# Patient Record
Sex: Female | Born: 1950
Health system: Southern US, Community
[De-identification: ages and names within clinical notes are randomized; demographics above are authoritative.]

## PROBLEM LIST (undated history)

## (undated) DIAGNOSIS — F329 Major depressive disorder, single episode, unspecified: Secondary | ICD-10-CM

## (undated) DIAGNOSIS — E785 Hyperlipidemia, unspecified: Secondary | ICD-10-CM

## (undated) DIAGNOSIS — M1990 Unspecified osteoarthritis, unspecified site: Secondary | ICD-10-CM

## (undated) DIAGNOSIS — F419 Anxiety disorder, unspecified: Secondary | ICD-10-CM

## (undated) DIAGNOSIS — T8149XA Infection following a procedure, other surgical site, initial encounter: Secondary | ICD-10-CM

## (undated) DIAGNOSIS — T8859XA Other complications of anesthesia, initial encounter: Secondary | ICD-10-CM

## (undated) DIAGNOSIS — Z9889 Other specified postprocedural states: Secondary | ICD-10-CM

## (undated) DIAGNOSIS — F32A Depression, unspecified: Secondary | ICD-10-CM

## (undated) DIAGNOSIS — T4145XA Adverse effect of unspecified anesthetic, initial encounter: Secondary | ICD-10-CM

## (undated) DIAGNOSIS — K219 Gastro-esophageal reflux disease without esophagitis: Secondary | ICD-10-CM

## (undated) DIAGNOSIS — Z9289 Personal history of other medical treatment: Secondary | ICD-10-CM

## (undated) DIAGNOSIS — I10 Essential (primary) hypertension: Secondary | ICD-10-CM

## (undated) HISTORY — PX: REDUCTION MAMMAPLASTY: SUR839

## (undated) HISTORY — DX: Personal history of other medical treatment: Z92.89

## (undated) HISTORY — PX: KNEE ARTHROSCOPY: SHX127

## (undated) HISTORY — PX: TONSILLECTOMY AND ADENOIDECTOMY: SUR1326

## (undated) HISTORY — PX: JOINT REPLACEMENT: SHX530

## (undated) HISTORY — DX: Anxiety disorder, unspecified: F41.9

## (undated) HISTORY — PX: OTHER SURGICAL HISTORY: SHX169

---

## 1998-09-23 HISTORY — PX: VAGINAL HYSTERECTOMY: SUR661

## 1998-09-23 HISTORY — PX: OTHER SURGICAL HISTORY: SHX169

## 2001-04-30 ENCOUNTER — Other Ambulatory Visit: Admission: RE | Admit: 2001-04-30 | Discharge: 2001-04-30 | Payer: Self-pay | Admitting: *Deleted

## 2001-05-26 ENCOUNTER — Encounter: Admission: RE | Admit: 2001-05-26 | Discharge: 2001-05-26 | Payer: Self-pay | Admitting: Family Medicine

## 2001-05-26 ENCOUNTER — Encounter: Payer: Self-pay | Admitting: Family Medicine

## 2002-07-06 ENCOUNTER — Ambulatory Visit: Admission: RE | Admit: 2002-07-06 | Discharge: 2002-07-06 | Payer: Self-pay | Admitting: Family Medicine

## 2002-07-06 ENCOUNTER — Encounter: Payer: Self-pay | Admitting: Family Medicine

## 2002-11-02 ENCOUNTER — Other Ambulatory Visit: Admission: RE | Admit: 2002-11-02 | Discharge: 2002-11-02 | Payer: Self-pay | Admitting: Obstetrics and Gynecology

## 2003-01-21 ENCOUNTER — Emergency Department (HOSPITAL_COMMUNITY): Admission: EM | Admit: 2003-01-21 | Discharge: 2003-01-22 | Payer: Self-pay | Admitting: Emergency Medicine

## 2003-01-21 ENCOUNTER — Encounter: Payer: Self-pay | Admitting: Emergency Medicine

## 2003-02-25 ENCOUNTER — Encounter: Payer: Self-pay | Admitting: Obstetrics and Gynecology

## 2003-02-25 ENCOUNTER — Encounter: Admission: RE | Admit: 2003-02-25 | Discharge: 2003-02-25 | Payer: Self-pay | Admitting: Obstetrics and Gynecology

## 2003-03-20 ENCOUNTER — Ambulatory Visit (HOSPITAL_COMMUNITY): Admission: RE | Admit: 2003-03-20 | Discharge: 2003-03-20 | Payer: Self-pay | Admitting: Specialist

## 2003-03-20 ENCOUNTER — Encounter: Payer: Self-pay | Admitting: Specialist

## 2003-09-19 ENCOUNTER — Ambulatory Visit (HOSPITAL_COMMUNITY): Admission: RE | Admit: 2003-09-19 | Discharge: 2003-09-19 | Payer: Self-pay | Admitting: Urology

## 2004-03-16 ENCOUNTER — Encounter: Admission: RE | Admit: 2004-03-16 | Discharge: 2004-03-16 | Payer: Self-pay | Admitting: Internal Medicine

## 2004-08-16 ENCOUNTER — Emergency Department (HOSPITAL_COMMUNITY): Admission: EM | Admit: 2004-08-16 | Discharge: 2004-08-16 | Payer: Self-pay | Admitting: Family Medicine

## 2005-06-13 ENCOUNTER — Encounter: Admission: RE | Admit: 2005-06-13 | Discharge: 2005-06-13 | Payer: Self-pay | Admitting: Internal Medicine

## 2006-01-13 ENCOUNTER — Encounter (HOSPITAL_COMMUNITY): Admission: RE | Admit: 2006-01-13 | Discharge: 2006-04-13 | Payer: Self-pay | Admitting: Cardiology

## 2006-09-09 ENCOUNTER — Encounter: Admission: RE | Admit: 2006-09-09 | Discharge: 2006-09-09 | Payer: Self-pay | Admitting: Internal Medicine

## 2006-09-09 LAB — HM MAMMOGRAPHY

## 2006-11-03 ENCOUNTER — Ambulatory Visit (HOSPITAL_COMMUNITY): Admission: RE | Admit: 2006-11-03 | Discharge: 2006-11-03 | Payer: Self-pay | Admitting: Physician Assistant

## 2007-12-17 ENCOUNTER — Encounter: Admission: RE | Admit: 2007-12-17 | Discharge: 2007-12-17 | Payer: Self-pay | Admitting: Internal Medicine

## 2008-04-12 ENCOUNTER — Ambulatory Visit (HOSPITAL_BASED_OUTPATIENT_CLINIC_OR_DEPARTMENT_OTHER): Admission: RE | Admit: 2008-04-12 | Discharge: 2008-04-13 | Payer: Self-pay | Admitting: Plastic Surgery

## 2008-04-12 ENCOUNTER — Encounter (INDEPENDENT_AMBULATORY_CARE_PROVIDER_SITE_OTHER): Payer: Self-pay | Admitting: Plastic Surgery

## 2008-04-12 HISTORY — PX: BREAST REDUCTION SURGERY: SHX8

## 2008-05-17 ENCOUNTER — Encounter: Admission: RE | Admit: 2008-05-17 | Discharge: 2008-05-17 | Payer: Self-pay | Admitting: Internal Medicine

## 2008-11-02 ENCOUNTER — Encounter: Admission: RE | Admit: 2008-11-02 | Discharge: 2008-12-05 | Payer: Self-pay | Admitting: Internal Medicine

## 2009-02-07 ENCOUNTER — Ambulatory Visit (HOSPITAL_BASED_OUTPATIENT_CLINIC_OR_DEPARTMENT_OTHER): Admission: RE | Admit: 2009-02-07 | Discharge: 2009-02-07 | Payer: Self-pay | Admitting: Urology

## 2009-03-14 ENCOUNTER — Ambulatory Visit (HOSPITAL_BASED_OUTPATIENT_CLINIC_OR_DEPARTMENT_OTHER): Admission: RE | Admit: 2009-03-14 | Discharge: 2009-03-14 | Payer: Self-pay | Admitting: Urology

## 2009-03-16 ENCOUNTER — Encounter: Admission: RE | Admit: 2009-03-16 | Discharge: 2009-03-16 | Payer: Self-pay | Admitting: Internal Medicine

## 2009-08-28 ENCOUNTER — Ambulatory Visit: Payer: Self-pay | Admitting: Sports Medicine

## 2009-08-28 DIAGNOSIS — M659 Synovitis and tenosynovitis, unspecified: Secondary | ICD-10-CM | POA: Insufficient documentation

## 2009-08-28 DIAGNOSIS — R269 Unspecified abnormalities of gait and mobility: Secondary | ICD-10-CM | POA: Insufficient documentation

## 2009-08-28 DIAGNOSIS — M2140 Flat foot [pes planus] (acquired), unspecified foot: Secondary | ICD-10-CM | POA: Insufficient documentation

## 2009-08-28 DIAGNOSIS — M79609 Pain in unspecified limb: Secondary | ICD-10-CM | POA: Insufficient documentation

## 2009-09-28 ENCOUNTER — Ambulatory Visit (HOSPITAL_COMMUNITY): Admission: RE | Admit: 2009-09-28 | Discharge: 2009-09-28 | Payer: Self-pay | Admitting: Obstetrics and Gynecology

## 2009-10-24 ENCOUNTER — Encounter: Admission: RE | Admit: 2009-10-24 | Discharge: 2009-10-24 | Payer: Self-pay | Admitting: Gastroenterology

## 2009-11-01 ENCOUNTER — Emergency Department (HOSPITAL_COMMUNITY): Admission: EM | Admit: 2009-11-01 | Discharge: 2009-11-01 | Payer: Self-pay | Admitting: Emergency Medicine

## 2009-12-26 ENCOUNTER — Encounter (HOSPITAL_COMMUNITY): Admission: RE | Admit: 2009-12-26 | Discharge: 2010-02-21 | Payer: Self-pay | Admitting: Interventional Cardiology

## 2009-12-26 HISTORY — PX: CARDIOVASCULAR STRESS TEST: SHX262

## 2010-04-20 ENCOUNTER — Encounter: Admission: RE | Admit: 2010-04-20 | Discharge: 2010-04-20 | Payer: Self-pay | Admitting: Internal Medicine

## 2010-05-25 ENCOUNTER — Ambulatory Visit (HOSPITAL_BASED_OUTPATIENT_CLINIC_OR_DEPARTMENT_OTHER): Admission: RE | Admit: 2010-05-25 | Discharge: 2010-05-25 | Payer: Self-pay | Admitting: Urology

## 2010-06-25 ENCOUNTER — Ambulatory Visit (HOSPITAL_COMMUNITY): Admission: RE | Admit: 2010-06-25 | Discharge: 2010-06-25 | Payer: Self-pay | Admitting: Urology

## 2010-10-13 ENCOUNTER — Encounter: Payer: Self-pay | Admitting: Internal Medicine

## 2010-10-14 ENCOUNTER — Encounter: Payer: Self-pay | Admitting: Gastroenterology

## 2010-10-14 ENCOUNTER — Encounter: Payer: Self-pay | Admitting: Occupational Medicine

## 2010-10-23 NOTE — Assessment & Plan Note (Signed)
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   Allergies (verified): No Known Drug Allergies  Physical Exam  General:  Well-developed,well-nourished,in no acute distress; alert,appropriate and cooperative throughout examination Msk:  swelling on outside of RT ankle this starts at level of lat malleolus and extends down with peroneal tendons sweeliong at insertion of PB into 5th MT base Thisis TTP MT shaft not as tender  standing has genu valgus has midfoot and long arch collapse worse on RT than left  MSK [place] there is increase in swelling of peroneal tendon sheaths on RT there is not a sign of avulsion or stress fx at base of 5th   Impression & Recommendations:  Problem # 1:  FOOT PAIN, RIGHT (ICD-729.5)  Orders: Airsport brace (U0454)  reassured that this is not stress fx see isntructions  Problem # 2:  ABNORMALITY OF GAIT (ICD-781.2) will use ankle support to lessen the sublux of ankle  note her gait improved and she felt better with changes in office  Problem # 3:  TENOSYNOVITIS OF FOOT AND ANKLE (ICD-727.06) use suport as noted icing meds as noted  Problem # 4:  PES PLANUS (ICD-734) needs scaphoid pads in most shoes consider orthotic if not getting enough relief  Patient Instructions: 1)  Use ankle brace while standing or doing a lot of walking over next 6 weeks 2)  heels are OK 3)  use arch pads in flatter shoes 4)  ice outside of foot and ankle at end  of day - use cup and massage - 5 to 10 mins 5)  try your biofreeze 6)  if too painful take some naprosyn 7)  exercises - just range of motion - writing alphabet 8)  recheck post 6 weeks

## 2010-12-06 LAB — POCT I-STAT 4, (NA,K, GLUC, HGB,HCT)
Glucose, Bld: 94 mg/dL (ref 70–99)
HCT: 41 % (ref 36.0–46.0)
Hemoglobin: 13.9 g/dL (ref 12.0–15.0)
Potassium: 3.8 mEq/L (ref 3.5–5.1)
Sodium: 144 mEq/L (ref 135–145)

## 2010-12-08 LAB — CREATININE, SERUM
Creatinine, Ser: 0.98 mg/dL (ref 0.4–1.2)
GFR calc Af Amer: >60 mL/min (ref >60)
GFR calc non Af Amer: 59 mL/min — ABNORMAL LOW (ref >60)

## 2010-12-08 LAB — BUN: BUN: 21 mg/dL (ref 6–23)

## 2010-12-31 LAB — POCT I-STAT 4, (NA,K, GLUC, HGB,HCT)
Glucose, Bld: 85 mg/dL (ref 70–99)
HCT: 41 % (ref 36.0–46.0)
Hemoglobin: 13.9 g/dL (ref 12.0–15.0)
Potassium: 3.8 mEq/L (ref 3.5–5.1)
Sodium: 142 mEq/L (ref 135–145)

## 2011-01-01 LAB — URINALYSIS, MICROSCOPIC ONLY
Bilirubin Urine: NEGATIVE
Glucose, UA: NEGATIVE mg/dL
Hgb urine dipstick: NEGATIVE
Ketones, ur: NEGATIVE mg/dL
Nitrite: NEGATIVE
Protein, ur: NEGATIVE mg/dL
Specific Gravity, Urine: 1.017 (ref 1.005–1.030)
Urobilinogen, UA: 0.2 mg/dL (ref 0.0–1.0)
pH: 6.5 (ref 5.0–8.0)

## 2011-01-01 LAB — URINE CULTURE
Colony Count: 15000
Special Requests: NEGATIVE

## 2011-01-01 LAB — COMPREHENSIVE METABOLIC PANEL
ALT: 21 U/L (ref 0–35)
AST: 27 U/L (ref 0–37)
Albumin: 3.5 g/dL (ref 3.5–5.2)
Alkaline Phosphatase: 86 U/L (ref 39–117)
BUN: 17 mg/dL (ref 6–23)
CO2: 29 mEq/L (ref 19–32)
Calcium: 9.3 mg/dL (ref 8.4–10.5)
Chloride: 104 mEq/L (ref 96–112)
Creatinine, Ser: 0.9 mg/dL (ref 0.4–1.2)
GFR calc Af Amer: >60 mL/min (ref >60)
GFR calc non Af Amer: >60 mL/min (ref >60)
Glucose, Bld: 85 mg/dL (ref 70–99)
Potassium: 3.6 mEq/L (ref 3.5–5.1)
Sodium: 141 mEq/L (ref 135–145)
Total Bilirubin: 0.8 mg/dL (ref 0.3–1.2)
Total Protein: 6.5 g/dL (ref 6.0–8.3)

## 2011-01-01 LAB — CBC
HCT: 38.6 % (ref 36.0–46.0)
Hemoglobin: 13 g/dL (ref 12.0–15.0)
MCHC: 33.8 g/dL (ref 30.0–36.0)
MCV: 88.8 fL (ref 78.0–100.0)
Platelets: 224 10*3/uL (ref 150–400)
RBC: 4.35 MIL/uL (ref 3.87–5.11)
RDW: 14.3 % (ref 11.5–15.5)
WBC: 5.5 10*3/uL (ref 4.0–10.5)

## 2011-01-01 LAB — APTT: aPTT: 26 seconds (ref 24–37)

## 2011-01-01 LAB — PROTIME-INR
INR: 1 (ref 0.00–1.49)
Prothrombin Time: 13.3 seconds (ref 11.6–15.2)

## 2011-02-05 NOTE — Op Note (Signed)
Jennifer Sanford, Jennifer Sanford                 ACCOUNT NO.:  [id number]   MEDICAL RECORD NO.:  [id number]          PATIENT TYPE:  AMB   LOCATION:  [facility]                         FACILITY:  [facility]   PHYSICIAN:  Jennifer Sanford, M.D.  DATE OF BIRTH:  22-Mar-1951   DATE OF PROCEDURE:  02/07/2009  DATE OF DISCHARGE:                               OPERATIVE REPORT   PREOPERATIVE DIAGNOSIS:  Right hydronephrosis.   POSTOPERATIVE DIAGNOSIS:  Right hydronephrosis plus ureteral stricture  (distal).   PROCEDURE DONE:  Cystoscopy, right retrograde pyelogram, balloon  dilation of the ureteral stricture, ureteroscopy, and insertion of  double-J stent.   SURGEONS:  Danae Chen, MD, and Jennifer Sanford. Jennifer Ammons, MD.   ANESTHESIA:  General.   INDICATION:  The patient is a 60 year old female, who had a ureteral  reimplantation done for ureteral injury in 2001 in [place], [place].  About a month ago while she was in [place], she had severe right flank  pain, went to the emergency room, was found to have right  hydronephrosis, and she was told that she had kidney stones.  She  returned to [facility] and was still having pain on and off.  I reviewed  the CT scan with Dr. Fredia Sanford, and we do not think that there is any  ureteral stone, but she has marked right hydronephrosis.  She is  scheduled today for cystoscopy, retrograde pyelogram, and insertion of  double-J stent.   Patient was identified by her wrist band and proper timeout was taken.   She was then placed in the dorsal lithotomy position, a panendoscope was  inserted in the bladder.  The bladder mucosa is normal, there is no  stone or tumor in the bladder.  The native right ureteral orifice is in  place, the left ureteral orifice is in normal position with clear  efflux.  There is a dimple at the dome of the bladder on the right side  that gives you the impression of a neoureteral orifice.   RETROGRADE PYELOGRAM:  I catheterized the native right ureteral  orifice  with a #5-French open-ended ureteral catheter.  I injected contrast  through the open-ended catheter.  The contrast stopped in the distal  ureter without progression into the midureter.   I then attempted to catheterize what appears to be the neoureteral  orifice at the dome of the bladder on the right side and it was  difficult to pass a Glidewire through that stenotic opening.  After  several maneuvers, I was able to pass the Glidewire through that orifice  and advanced a #5-French open-ended catheter over the Glidewire.  The  Glidewire was then removed.  Contrast was then injected through the open-  ended catheter.  The intramural ureter is very stenotic and there is a U-  deformity of the distal ureter and then contrast went up the ureter,  which appears dilated.  I then gently passed a Glidewire through the  open-ended catheter and was able to advance the Glidewire up into the  renal pelvis and I was able to advance the #5-French open-ended ended  catheter  over the Glidewire into the renal pelvis.  I then removed the  Glidewire and injected contrast through the open-ended catheter.  The  renal pelvis and calices are markedly dilated.  There is no evidence of  filling defect in the renal pelvis nor in the calices.  I then passed a  sensor guidewire through the open-ended catheter and removed the open-  ended catheter.  I then attempted to pass a #8-French double-J catheter  over the guidewire and the #8-French catheter would not go through the  intramural ureter.  I then removed the #8-French catheter and passed a  number #4.8-French double-J catheter over the guidewire without  difficulty into the renal pelvis.  At that point, I thought I would  remove the #4-French double-J stent and replace it with a #6-French and  in the process of removing the double-J stent, the distal end of the  stent migrated up into the ureter.  I attempted to pass a  ureteroscope  through the  distal ureter but was unable to do so.  At that point, I  called Dr. Vernie Sanford and asked him for consultation.  Since the guidewire  was already in place, he suggested to dilate the intramural ureter and  then use the ureteroscope and pull the double-J stent down into the  bladder.   I passed a ureteral balloon dilator over the guidewire into the distal  ureter and dilated the intramural ureter up to number 14 mmHg.  I then  removed the ureteral balloon dilator and I was able to pass the  ureteroscope without difficulty through the intramural ureter.  Then,  the distal end of the double-J stent was seen in the ureter and was  grasped with a Nitinol basket and pulled down in the bladder.  The  proximal curl of the double-J stent is in the renal pelvis, the distal  curl is in the bladder.  The ureteroscope was then removed.  The  cystoscope was then reinserted in the bladder.  The distal curl of the  double-J stent is well-seen in the bladder.  The bladder was then  emptied and the cystoscope and guidewire were removed.   The patient tolerated the procedure well and left the OR in satisfactory  condition to postanesthesia care unit.      Jennifer Sanford, M.D.  Electronically Signed     MN/MEDQ  D:  02/07/2009  T:  02/07/2009  Job:  [id number]

## 2011-02-05 NOTE — Op Note (Signed)
NAMELINZY, Jennifer Sanford                 ACCOUNT NO.:  [id number]   MEDICAL RECORD NO.:  [id number]          PATIENT TYPE:  AMB   LOCATION:  [facility]                          FACILITY:  [facility]   PHYSICIAN:  Etter Sjogren, M.D.     DATE OF BIRTH:  09/21/1951   DATE OF PROCEDURE:  04/12/2008  DATE OF DISCHARGE:                               OPERATIVE REPORT   PREOPERATIVE DIAGNOSIS:  Macromastia.   POSTOPERATIVE DIAGNOSIS:  Macromastia.   PROCEDURE PERFORMED:  Bilateral breast reduction.   SURGEON:  Etter Sjogren, MD   ASSISTANT:  Pleas Patricia, MD   ANESTHESIA:  General.   ESTIMATED BLOOD LOSS:  200 mL.   CLINICAL NOTE:  A 60 year old woman complains of large breast with upper  back, neck, shoulder pain, bra strap shoulder grooving and desires a  breast reduction.  The procedure and the risks and post complications  were discussed with her in great detail.  She understood those risks  including but not limited to bleeding, infection, anesthesia  complications, healing problems, scarring, loss of sensation in the  nipple, loss of the nipple and the possibility of free nipple grafting  during the surgery depending upon the appearance of the nipple, fluid  collections, significant scarring issues, asymmetry and loss of skin,  loss of fat including fat necrosis.  She understood all this and wished  to proceed.   DESCRIPTION OF PROCEDURE:  The patient was placed in a full sitting  position in the holding area and marked for the breast reduction.  She  was then taken to the operating room and placed supine.  After  successful induction of general anesthesia, she was prepped with  Betadine and draped with sterile drapes.  An 8-cm wide inferior nipple-  areolar pedicle was marked and the 42-mm marker marked the new nipple-  areolar complex.  Incisions made, pedicle de-epithelialized and was then  dissected free from the surrounding tissue beveling in an hour direction  medially and laterally  in order to ensure a broader attachment at the  level of the chest wall than it was present at the level of the skin.  At the conclusion of this dissection, nipple complexes were inspected  and found to have excellent color with bright red bleeding around the  periphery consistent with viability.  Resections were performed medial,  central and lateral and the total amount resected 901 grams from the  smaller left side, and 1004 grams from the larger right side.  It seemed  to give her a very nice symmetry.  A thorough irrigation with saline.  Meticulous hemostasis with electrocautery.  Excellent hemostasis having  been achieved, the closure with 2-0 Monocryl interrupted inverted deep  suture at the inverted T position, 3-0 Monocryl interrupted inverted  deep dermal sutures, a running 3-0 Monocryl subcuticular suture along  the inframammary crease.  A measurement was then taken 5 cm up from the  inframammary crease and the 42-mm marker marked the new nipple-areolar  site.  Tissue resected and the nipple-areolar complex was brought  through this opening.  They were inspected again.  They were found to  have excellent color and again bright red bleeding around the periphery  consistent with viability.  Nipple-areolar insetting 3-0  Monocryl interrupted inverted deep dermal sutures and running 4-0  Monocryl subcuticular suture.  Steri-Strips, dry sterile dressing and a  circumferential Ace wrap were applied.  She was transferred to the  recovery room stable having tolerated the procedure well and she will be  observed in the RCC overnight.      Etter Sjogren, M.D.  Electronically Signed     DB/MEDQ  D:  04/12/2008  T:  04/13/2008  Job:  [id number]

## 2011-02-05 NOTE — Op Note (Signed)
Jennifer Sanford, Jennifer Sanford                 ACCOUNT NO.:  [id number]   MEDICAL RECORD NO.:  [id number]          PATIENT TYPE:  AMB   LOCATION:  [facility]                         FACILITY:  [facility]   PHYSICIAN:  [name], M.D.  DATE OF BIRTH:  03/21/1951   DATE OF PROCEDURE:  03/14/2009  DATE OF DISCHARGE:                               OPERATIVE REPORT   PREOPERATIVE DIAGNOSES:  1. Right hydronephrosis.  2. Ureteral stricture.   POSTOPERATIVE DIAGNOSES:  1. Right hydronephrosis.  2. Ureteral stricture.   PROCEDURE:  Cystoscopy, right retrograde pyelogram, ureteral dilation,  and insertion of double-J stent.   SURGEON:  Dr. Brunilda Payor.   ANESTHESIA:  General.   INDICATIONS:  The patient is a 60 years old female who had ureteral  reimplantation for ureteral injury several years ago in [place], [place].  She developed a ureteral stricture with right hydronephrosis.  She had ureteral dilation done on Feb 07, 2009 with insertion of a #4-  [place] double-J catheter.  The double-J stent was removed on 06/03.  Renal ultrasound post stent removal showed worsening right  hydronephrosis.  She is scheduled today for cystoscopy, retrograde  pyelogram, ureteral dilation, and insertion of a larger double-J stent.  She understands that after removing the stent if she starts having again  hydronephrosis she will need ureteral exploration with reimplantation,  and she agrees to have the procedure done, since it is less invasive  than an open procedure at this time.   The patient was identified by her wrist band and proper time-out was  taken.   DESCRIPTION OF PROCEDURE:  Under general anesthesia she was prepped and  draped and placed in the dorsal lithotomy position.  A panendoscope was  inserted in the bladder.  The bladder mucosa is normal.  There is no  stone or tumor in the bladder.  The neo ureteral orifice was visualized  at the dome of the bladder on the right side.  A #6-French double-J  catheter was passed through the cystoscope and a Glidewire was passed  through the open-ended catheter, and the Glidewire was passed through  the neo ureteral orifice, but the #6-French double-J catheter could not  be passed over the Glidewire.  The #6-French was then removed.  A 5-  [place] open-ended catheter was passed through the cystoscope and the  Glidewire was passed through the #5-French open-ended catheter, and  passed through the ureteral orifice, and the #5-French open-ended  catheter was passed over the Glidewire in the distal ureter, and the  Glidewire was passed all the way up into the renal pelvis.  The open-  ended catheter was then passed in the distal ureter, and the Glidewire  was removed.  Contrast was then injected.   Retrograde pyelogram.  Contrast was then injected through the open-ended  catheter.  The ureter is moderately dilated and the renal pelvis and  collecting system are dilated.  A sensor tip guidewire was then passed  through the open-ended catheter and the open-ended catheter was removed.  A ureteral balloon catheter was then passed over the sensor guidewire,  and  the stricture of the distal ureter was dilated for 5 minutes with 14  mmHg pressure.  Then the ureteral balloon catheter was removed.  The  ureteroscope access sheath was then passed without difficulty over the  Glidewire into the mid ureter.  The ureteroscope access sheath was used  with the outer sheath also.  The ureteroscope access sheath was then  removed.  A #8-French - 24 double-J catheter was then passed over the  guide wire.  The proximal curl of the double-J catheter is in the  collecting system.  The distal curl is in the bladder.   The bladder was then emptied and the cystoscope and guidewire were  removed.   The patient tolerated the procedure well and left the OR in satisfactory  condition to the post anesthesia care unit.      [name], M.D.  Electronically  Signed     MN/MEDQ  D:  03/14/2009  T:  03/14/2009  Job:  [id number]

## 2011-02-08 NOTE — Op Note (Signed)
NAMEKERYN, NESSLER                 ACCOUNT NO.:  [id number]   MEDICAL RECORD NO.:  [id number]          PATIENT TYPE:  AMB   LOCATION:  [facility]                         FACILITY:  [facility]   PHYSICIAN:  Anselmo Rod, M.D.  DATE OF BIRTH:  1951-02-07   DATE OF PROCEDURE:  11/04/2006  DATE OF DISCHARGE:                               OPERATIVE REPORT   PROCEDURE PERFORMED:  Screening colonoscopy.   ENDOSCOPIST:  Anselmo Rod, M.D.   INSTRUMENT USED:  Pentax video colonoscope.   INDICATIONS FOR PROCEDURE:  A 60 year old African-American female  undergoing screening colonoscopy to rule out colonic polyps.   PREPROCEDURE PREPARATION:  Informed consent was procured from the  patient.  The patient was fasted for four hours prior to the procedure  and prepped with 20 OsmoPrep pills the night of and 12 Osmoprep pills  the morning of the procedure.  The risks and benefits of the procedure  including a 10% miss rate for cancer or polyps was discussed with the  patient as well.   PREPROCEDURE PHYSICAL:  The patient had stable vital signs.  Neck  supple.  Chest clear to auscultation.  S1 and S2 regular.  Abdomen soft  with normal bowel sounds.   DESCRIPTION OF PROCEDURE:  The patient was placed in left lateral  decubitus position and sedated with 150 mcg of Fentanyl and 12.5 mg of  Versed intravenously in slow incremental doses.  Once the patient was  adequately sedated and maintained on low flow oxygen and continuous  cardiac monitoring, the Pentax video colonoscope was advanced from the  rectum to the cecum.  The appendicular orifice and ileocecal valve were  clearly visualized and photographed.  The terminal ileum appeared  healthy and without lesions.  No masses, polyps, erosions, ulcerations  or diverticula were seen.  Retroflexion in the rectum revealed no  abnormalities.   IMPRESSION:  1. Normal colonoscopy to the terminal ileum.  No masses, polyps or      diverticula seen.  2.  Normal-appearing rectum.  No abnormalities noted on retroflexion in      the rectum.   RECOMMENDATIONS:  1. Continue high fiber diet with liberal fluid intake.  2. Repeat colonoscopy is recommended in the next 10 year yeas.  If the      patient develops any abnormal symptoms in the interim she should      contact the office immediately for further recommendations.  3. Outpatient followup as need arises in the future.      Anselmo Rod, M.D.  Electronically Signed     JNM/MEDQ  D:  11/04/2006  T:  11/05/2006  Job:  [id number]   cc:   Merlene Laughter. Renae Gloss, M.D.

## 2011-05-20 ENCOUNTER — Other Ambulatory Visit: Payer: Self-pay | Admitting: Internal Medicine

## 2011-05-20 DIAGNOSIS — Z1231 Encounter for screening mammogram for malignant neoplasm of breast: Secondary | ICD-10-CM

## 2011-05-24 ENCOUNTER — Ambulatory Visit
Admission: RE | Admit: 2011-05-24 | Discharge: 2011-05-24 | Disposition: A | Discharge status: Home / Self Care | Payer: 59 | Source: Ambulatory Visit | Attending: Internal Medicine | Admitting: Internal Medicine

## 2011-05-24 DIAGNOSIS — Z1231 Encounter for screening mammogram for malignant neoplasm of breast: Secondary | ICD-10-CM

## 2011-06-21 LAB — BASIC METABOLIC PANEL
BUN: 15
CO2: 30
Calcium: 9.7
Chloride: 108
Creatinine, Ser: 0.92
GFR calc Af Amer: >60
GFR calc non Af Amer: >60
Glucose, Bld: 85
Potassium: 4.6
Sodium: 144

## 2011-06-21 LAB — POCT HEMOGLOBIN-HEMACUE: Hemoglobin: 14.9

## 2011-09-16 ENCOUNTER — Emergency Department (HOSPITAL_COMMUNITY)
Admission: EM | Admit: 2011-09-16 | Discharge: 2011-09-16 | Disposition: A | Discharge status: Home / Self Care | Payer: 59 | Source: Home / Self Care | Attending: Emergency Medicine | Admitting: Emergency Medicine

## 2011-09-16 ENCOUNTER — Encounter: Payer: Self-pay | Admitting: *Deleted

## 2011-09-16 DIAGNOSIS — L0202 Furuncle of face: Secondary | ICD-10-CM

## 2011-09-16 DIAGNOSIS — H60399 Other infective otitis externa, unspecified ear: Secondary | ICD-10-CM

## 2011-09-16 DIAGNOSIS — H6010 Cellulitis of external ear, unspecified ear: Secondary | ICD-10-CM

## 2011-09-16 DIAGNOSIS — H6000 Abscess of external ear, unspecified ear: Secondary | ICD-10-CM

## 2011-09-16 HISTORY — DX: Essential (primary) hypertension: I10

## 2011-09-16 MED ORDER — IBUPROFEN 800 MG PO TABS
ORAL_TABLET | ORAL | Status: AC
  Filled 2011-09-16: qty 1

## 2011-09-16 MED ORDER — MUPIROCIN 2 % EX OINT: TOPICAL_OINTMENT | Freq: Three times a day (TID) | CUTANEOUS | Status: AC

## 2011-09-16 MED ORDER — IBUPROFEN 800 MG PO TABS
800.0000 mg | ORAL_TABLET | Freq: Once | ORAL | Status: AC
  Administered 2011-09-16: 800 mg via ORAL

## 2011-09-16 MED ORDER — DOXYCYCLINE HYCLATE 100 MG PO CAPS: 100.0000 mg | ORAL_CAPSULE | Freq: Two times a day (BID) | ORAL | Status: AC

## 2011-09-16 NOTE — ED Provider Notes (Addendum)
History     CSN: [id number]  Arrival date & time 09/16/11  4098   First MD Initiated Contact with Patient 09/16/11 [phone]      Chief Complaint  Patient presents with  . Otalgia    (Consider location/radiation/quality/duration/timing/severity/associated sxs/prior treatment) HPI Comments: R aer has been sore and tender for the last couple of days, its hurting now" No drainage or changes in hearing, No injury that I remember" No fevers  Patient is a 60 y.o. female presenting with ear pain. The history is provided by the patient.  Otalgia This is a new problem. The current episode started more than 2 days ago. There is pain in the right ear. The problem occurs constantly. The problem has been gradually worsening. There has been no fever. The pain is at a severity of 6/10. Pertinent negatives include no ear discharge, no headaches, no hearing loss, no rhinorrhea, no neck pain and no cough.    Past Medical History  Diagnosis Date  . Hypertension     Past Surgical History  Procedure Date  . Abdominal hysterectomy     Family History  Problem Relation Age of Onset  . Diabetes Mother   . Hypertension Father   . Cancer Father   . Cancer Other     History  Substance Use Topics  . Smoking status: Not on file  . Smokeless tobacco: Not on file  . Alcohol Use:     OB History    Grav Para Term Preterm Abortions TAB SAB Ect Mult Living                  Review of Systems  HENT: Positive for ear pain. Negative for hearing loss, rhinorrhea, neck pain, tinnitus and ear discharge.   Respiratory: Negative for cough.   Neurological: Negative for headaches.    Allergies  Review of patient's allergies indicates no known allergies.  Home Medications   Current Outpatient Rx  Name Route Sig Dispense Refill  . CITALOPRAM HYDROBROMIDE 10 MG PO TABS Oral Take 10 mg by mouth daily.      Marland Kitchen ROSUVASTATIN CALCIUM 20 MG PO TABS Oral Take 20 mg by mouth daily.      . TRIAMTERENE-HCTZ  37.5-25 MG PO TABS Oral Take 1 tablet by mouth daily.      Marland Kitchen DOXYCYCLINE HYCLATE 100 MG PO CAPS Oral Take 1 capsule (100 mg total) by mouth 2 (two) times daily. 20 capsule 0  . MUPIROCIN 2 % EX OINT Topical Apply topically 3 (three) times daily. X 7 days 22 g 0    BP 115/67  Pulse 66  Temp(Src) 98.3 F (36.8 C) (Oral)  Resp 16  SpO2 100%  Physical Exam  Nursing note and vitals reviewed. Constitutional: She appears well-developed and well-nourished.  HENT:  Head: Normocephalic.  Right Ear: There is swelling and tenderness. No drainage. No foreign bodies. Tympanic membrane is not injected and not erythematous.  Ears:  Eyes: Conjunctivae are normal.  Neck: Neck supple. No JVD present.  Lymphadenopathy:    She has no cervical adenopathy.  Neurological: She is alert.  Skin: Skin is warm. No erythema.    ED Course  INCISION AND DRAINAGE Performed by: Veneta Sliter Authorized by: Deondrick Searls  INCISION AND DRAINAGE Performed by: Kentrail Shew Authorized by: Jimmie Molly Consent: Verbal consent obtained. Patient understanding: patient states understanding of the procedure being performed Patient identity confirmed: verbally with patient Type: abscess Location: external ear canal. Patient sedated: no Complexity: simple Drainage: serosanguinous   (  including critical care time)  Labs Reviewed - No data to display No results found.   1. Furuncle of ear   2. Cellulitis of ear canal       MDM  R era canal early abscess formation x 4 days- punctured with 18 g needled- topical and systemic antibiotics.        Jimmie Molly, MD 09/16/11 1418  Jimmie Molly, MD 09/16/11 [phone]

## 2011-09-16 NOTE — ED Notes (Signed)
Pt  Reports  Some  Pain /  Selling  r  Ear  Canal    X  4  Days  denys  Any other  Symptoms

## 2012-07-10 ENCOUNTER — Other Ambulatory Visit: Payer: Self-pay | Admitting: Internal Medicine

## 2012-07-10 DIAGNOSIS — Z1231 Encounter for screening mammogram for malignant neoplasm of breast: Secondary | ICD-10-CM

## 2012-07-29 ENCOUNTER — Ambulatory Visit
Admission: RE | Admit: 2012-07-29 | Discharge: 2012-07-29 | Disposition: A | Discharge status: Home / Self Care | Payer: 59 | Source: Ambulatory Visit | Attending: Internal Medicine | Admitting: Internal Medicine

## 2012-07-29 DIAGNOSIS — Z1231 Encounter for screening mammogram for malignant neoplasm of breast: Secondary | ICD-10-CM

## 2013-02-16 ENCOUNTER — Other Ambulatory Visit: Payer: Self-pay | Admitting: Orthopedic Surgery

## 2013-03-03 ENCOUNTER — Other Ambulatory Visit: Payer: Self-pay | Admitting: Orthopedic Surgery

## 2013-03-03 MED ORDER — DEXAMETHASONE SODIUM PHOSPHATE 10 MG/ML IJ SOLN: 10.0000 mg | Freq: Once | INTRAMUSCULAR | Status: DC

## 2013-03-03 NOTE — Progress Notes (Signed)
Preoperative surgical orders have been place into the Epic hospital system for Jennifer Sanford on 03/03/2013, 10:35 PM  by Patrica Duel for surgery on 03/17/2013.  Preop Bilateral Total Knee orders including Epidural per Anesthesia, PO Tylenol, and IV Decadron as long as there are no contraindications to the above medications. Avel Peace, PA-C

## 2013-03-05 ENCOUNTER — Encounter (HOSPITAL_COMMUNITY): Payer: Self-pay | Admitting: Pharmacy Technician

## 2013-03-12 ENCOUNTER — Encounter (HOSPITAL_COMMUNITY)
Admission: RE | Admit: 2013-03-12 | Discharge: 2013-03-12 | Disposition: A | Discharge status: Home / Self Care | Payer: 59 | Source: Ambulatory Visit | Attending: Orthopedic Surgery | Admitting: Orthopedic Surgery

## 2013-03-12 ENCOUNTER — Other Ambulatory Visit: Payer: Self-pay

## 2013-03-12 ENCOUNTER — Encounter (HOSPITAL_COMMUNITY): Payer: Self-pay

## 2013-03-12 ENCOUNTER — Ambulatory Visit (HOSPITAL_COMMUNITY)
Admission: RE | Admit: 2013-03-12 | Discharge: 2013-03-12 | Disposition: A | Discharge status: Home / Self Care | Payer: 59 | Source: Ambulatory Visit | Attending: Orthopedic Surgery | Admitting: Orthopedic Surgery

## 2013-03-12 DIAGNOSIS — Z01818 Encounter for other preprocedural examination: Secondary | ICD-10-CM | POA: Insufficient documentation

## 2013-03-12 DIAGNOSIS — Z01810 Encounter for preprocedural cardiovascular examination: Secondary | ICD-10-CM | POA: Insufficient documentation

## 2013-03-12 HISTORY — DX: Gastro-esophageal reflux disease without esophagitis: K21.9

## 2013-03-12 HISTORY — DX: Depression, unspecified: F32.A

## 2013-03-12 HISTORY — DX: Major depressive disorder, single episode, unspecified: F32.9

## 2013-03-12 LAB — CBC
HCT: 38.4 % (ref 36.0–46.0)
Hemoglobin: 12.6 g/dL (ref 12.0–15.0)
MCH: 29.7 pg (ref 26.0–34.0)
MCHC: 32.8 g/dL (ref 30.0–36.0)
MCV: 90.6 fL (ref 78.0–100.0)
Platelets: 271 10*3/uL (ref 150–400)
RBC: 4.24 MIL/uL (ref 3.87–5.11)
RDW: 14.2 % (ref 11.5–15.5)
WBC: 7.2 10*3/uL (ref 4.0–10.5)

## 2013-03-12 LAB — COMPREHENSIVE METABOLIC PANEL
ALT: 23 U/L (ref 0–35)
AST: 22 U/L (ref 0–37)
Albumin: 3.5 g/dL (ref 3.5–5.2)
Alkaline Phosphatase: 77 U/L (ref 39–117)
BUN: 25 mg/dL — ABNORMAL HIGH (ref 6–23)
CO2: 31 mEq/L (ref 19–32)
Calcium: 9.9 mg/dL (ref 8.4–10.5)
Chloride: 103 mEq/L (ref 96–112)
Creatinine, Ser: 0.92 mg/dL (ref 0.50–1.10)
GFR calc Af Amer: 77 mL/min — ABNORMAL LOW (ref >90)
GFR calc non Af Amer: 67 mL/min — ABNORMAL LOW (ref >90)
Glucose, Bld: 83 mg/dL (ref 70–99)
Potassium: 5 mEq/L (ref 3.5–5.1)
Sodium: 140 mEq/L (ref 135–145)
Total Bilirubin: 0.3 mg/dL (ref 0.3–1.2)
Total Protein: 7.5 g/dL (ref 6.0–8.3)

## 2013-03-12 LAB — URINALYSIS, ROUTINE W REFLEX MICROSCOPIC
Bilirubin Urine: NEGATIVE
Glucose, UA: NEGATIVE mg/dL
Hgb urine dipstick: NEGATIVE
Ketones, ur: NEGATIVE mg/dL
Nitrite: NEGATIVE
Protein, ur: NEGATIVE mg/dL
Specific Gravity, Urine: 1.023 (ref 1.005–1.030)
Urobilinogen, UA: 0.2 mg/dL (ref 0.0–1.0)
pH: 6.5 (ref 5.0–8.0)

## 2013-03-12 LAB — APTT: aPTT: 29 seconds (ref 24–37)

## 2013-03-12 LAB — SURGICAL PCR SCREEN
MRSA, PCR: NEGATIVE
Staphylococcus aureus: NEGATIVE

## 2013-03-12 LAB — URINE MICROSCOPIC-ADD ON

## 2013-03-12 LAB — PROTIME-INR
INR: 0.95 (ref 0.00–1.49)
Prothrombin Time: 12.6 seconds (ref 11.6–15.2)

## 2013-03-12 NOTE — Patient Instructions (Addendum)
20 ANJELIQUE MAKAR  03/12/2013   Your procedure is scheduled on: 03/17/13  Report to [facility] at 7:15 AM.  Call this number if you have problems the morning of surgery [phone]   Remember:   Do not eat food or drink liquids After Midnight.     Take these medicines the morning of surgery with A SIP OF WATER: celexa, protonix, crestor   Do not wear jewelry, make-up or nail polish.  Do not wear lotions, powders, or perfumes. You may wear deodorant.  Do not shave 48 hours prior to surgery. Men may shave face and neck.  Do not bring valuables to the hospital.  Contacts, dentures or bridgework may not be worn into surgery.  Leave suitcase in the car. After surgery it may be brought to your room.  For patients admitted to the hospital, checkout time is 11:00 AM the day of discharge.    Please read over the following fact sheets that you were given: MRSA Information, incentive spirometry fact sheet, blood fact sheet.   Birdie Sons, RN  pre op nurse call if needed [phone]    FAILURE TO FOLLOW THESE INSTRUCTIONS MAY RESULT IN CANCELLATION OF YOUR SURGERY   Patient Signature: ___________________________________________

## 2013-03-12 NOTE — Progress Notes (Signed)
Surgery clearance note Dr. Renae Gloss on chart.

## 2013-03-16 ENCOUNTER — Other Ambulatory Visit: Payer: Self-pay | Admitting: Orthopedic Surgery

## 2013-03-16 NOTE — H&P (Signed)
Jennifer Sanford  DOB: Apr 25, 1951 Married / Language: [place] / Race: Black or African American Female  Date of Admission:  03/17/2013  Chief Complaint:  Bilateral Knee Pain  History of Present Illness The patient is a 62 year old female who comes in for a preoperative History and Physical. The patient is scheduled for a bilateral total knee arthroplasty to be performed by Dr. Gus Rankin. Aluisio, MD at [facility] on 03/17/2013. The patient is a 62 year old female who presents for follow up of their knee. The patient is being followed for their bilateral knee pain and osteoarthritis. They have been treated recently with cortisone injections. Symptoms reported today include: pain and swelling. Note for "Follow-up Knee": She says the Celebrex has helped, and she feels like she can "make do" if she can continue to take that. She has finally gotten the knees to settle down some. She knows she needs to have the knees replaced. The cortisone injections are helping less and less. She felt as though the Celebrex was very beneficial but continued to have limiting pain and discomfort. She is very active but her activity is becoming more and more limited due to her knees. She has reached a point where she is now ready to proceed with bilateral knee replacements at this time. They have been treated conservatively in the past for the above stated problem and despite conservative measures, they continue to have progressive pain and severe functional limitations and dysfunction. They have failed non-operative management including home exercise, medications, and injections. It is felt that they would benefit from undergoing total joint replacement. Risks and benefits of the procedure have been discussed with the patient and they elect to proceed with surgery. There are no active contraindications to surgery such as ongoing infection or rapidly progressive neurological disease.   Allergies No  Known Drug Allergies. 05/09/2011   Family History Father. Deceased, Lung Cancer. age 4 Mother. Deceased, Hypertension, Transient ischemic attack, Dementia. age 17   Social History Alcohol use. never consumed alcohol Children. 3 Current work status. working full time Drug/Alcohol Rehab (Currently). no Exercise. Exercises weekly; does running / walking and individual sport Illicit drug use. no Living situation. live alone Marital status. 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 4-5 Pain Contract. no Previously in rehab. no Tobacco / smoke exposure. yes outdoors only Tobacco use. Former smoker. former smoker; smoke(d) less than 1/2 pack(s) per day Post-Surgical Plans. Plan is for [facility]. Advance Directives. Heathcare POA   Medication History CeleBREX (200MG  Capsule, 1 (one) Capsule Oral daily, Taken starting 01/25/2013) Active. Fish Oil Concentrate (1000MG  Capsule, Oral) Active. Vitamin B12 ( Tablet, Oral) Active. CeleXA ( Oral) Specific dose unknown - Active. Crestor ( Oral) Specific dose unknown - Active. Protonix (40MG  Tablet DR, Oral) Active. Triamterene-HCTZ (37.5-25MG  Capsule, Oral) Active.   Past Surgical History  Arthroscopy of Knee. left Hysterectomy. partial (non-cancerous) Mammoplasty; Reduction. bilateral  Medical History Hypercholesterolemia Osteoarthritis Depression Gastroesophageal Reflux Disease High blood pressure Impaired Vision. Wears glasses   Review of Systems  General:Not Present- Chills, Fever, Night Sweats, Fatigue, Weight Gain, Weight Loss and Memory Loss. Skin:Not Present- Hives, Itching, Rash, Eczema and Lesions. HEENT:Not Present- Tinnitus, Headache, Double Vision, Visual Loss, Hearing Loss and Dentures. Respiratory:Not Present- Shortness of breath with exertion, Shortness of breath at rest, Allergies, Coughing  up blood and Chronic Cough. Cardiovascular:Not Present- Chest Pain, Racing/skipping heartbeats, Difficulty Breathing Lying Down, Murmur, Swelling and Palpitations. Gastrointestinal:Not  Present- Bloody Stool, Heartburn, Abdominal Pain, Vomiting, Nausea, Constipation, Diarrhea, Difficulty Swallowing, Jaundice and Loss of appetitie. Female Genitourinary:Not Present- Blood in Urine, Urinary frequency, Weak urinary stream, Discharge, Flank Pain, Incontinence, Painful Urination, Urgency, Urinary Retention and Urinating at Night. Musculoskeletal:Present- Joint Pain. Not Present- Muscle Weakness, Muscle Pain, Joint Swelling, Back Pain, Morning Stiffness and Spasms. Neurological:Not Present- Tremor, Dizziness, Blackout spells, Paralysis, Difficulty with balance and Weakness. Psychiatric:Not Present- Insomnia.   Vitals Weight: 220 lb Height: 71 in Weight was reported by patient. Height was reported by patient. Body Surface Area: 2.24 m Body Mass Index: 30.68 kg/m Pulse: 68 (Regular) Resp.: 14 (Unlabored) BP: 118/74 (Sitting, Right Arm, Standard)    Physical Exam  The physical exam findings are as follows:  Note: Patient is a 62 year old female with continued bilateral knee pain.   General Mental Status - Alert, cooperative and good historian. General Appearance- pleasant. Not in acute distress. Orientation- Oriented X3. Build & Nutrition- Well nourished and Well developed.   Head and Neck Head- normocephalic, atraumatic . Neck Global Assessment- supple. no bruit auscultated on the right and no bruit auscultated on the left.   Eye Pupil- Bilateral- Regular and Round. Motion- Bilateral- EOMI.   Chest and Lung Exam Auscultation: Breath sounds:- clear at anterior chest wall and - clear at posterior chest wall. Adventitious sounds:- No Adventitious sounds.   Cardiovascular Auscultation:Rhythm- Regular rate and rhythm. Heart Sounds- S1 WNL  and S2 WNL. Murmurs & Other Heart Sounds:Auscultation of the heart reveals - No Murmurs.   Abdomen Palpation/Percussion:Tenderness- Abdomen is non-tender to palpation. Rigidity (guarding)- Abdomen is soft. Auscultation:Auscultation of the abdomen reveals - Bowel sounds normal.   Female Genitourinary Not done, not pertinent to present illness  Musculoskeletal Very pleasant, well developed female, alert and oriented in no apparent distress. Both knees showed no effusion. She has marked crepitus. On range of motion of both knees range about 5 to 120 on both sides. There is no instability.  RADIOGRAPHS: AP and lateral of both knees show bone on bone patellofemoral arthritis on both sides. On the left knee, she is just about bone on bone medial right knee. She is bone on bone medial.  Assessment & Plan(Melanye Hiraldo L Rabon Scholle, III PA-C; 02/16/2013 10:32 AM) Primary osteoarthritis of both knees (715.16)  Note: Plan is for Bilateral Total Knee Replacements by Dr. Lequita Halt.  Plan is to go to [facility].  PCP - Dr. Renae Gloss - Patient has been seen preoperatively and felt to be stable for surgery.  Signed electronically by Lauraine Rinne, III PA-C

## 2013-03-17 ENCOUNTER — Inpatient Hospital Stay (HOSPITAL_COMMUNITY): Payer: 59 | Admitting: Anesthesiology

## 2013-03-17 ENCOUNTER — Encounter (HOSPITAL_COMMUNITY): Payer: Self-pay | Admitting: *Deleted

## 2013-03-17 ENCOUNTER — Encounter (HOSPITAL_COMMUNITY)
Admission: RE | Disposition: A | Discharge status: Rehabilitation Facility | Payer: Self-pay | Source: Ambulatory Visit | Attending: Orthopedic Surgery

## 2013-03-17 ENCOUNTER — Encounter (HOSPITAL_COMMUNITY): Payer: Self-pay | Admitting: Anesthesiology

## 2013-03-17 ENCOUNTER — Inpatient Hospital Stay (HOSPITAL_COMMUNITY)
Admission: RE | Admit: 2013-03-17 | Discharge: 2013-03-22 | DRG: 462 | Disposition: A | Discharge status: Rehabilitation Facility | Payer: 59 | Source: Ambulatory Visit | Attending: Orthopedic Surgery | Admitting: Orthopedic Surgery

## 2013-03-17 DIAGNOSIS — D62 Acute posthemorrhagic anemia: Secondary | ICD-10-CM | POA: Diagnosis not present

## 2013-03-17 DIAGNOSIS — Z9289 Personal history of other medical treatment: Secondary | ICD-10-CM

## 2013-03-17 DIAGNOSIS — E7800 Pure hypercholesterolemia, unspecified: Secondary | ICD-10-CM | POA: Diagnosis present

## 2013-03-17 DIAGNOSIS — M1710 Unilateral primary osteoarthritis, unspecified knee: Principal | ICD-10-CM | POA: Diagnosis present

## 2013-03-17 DIAGNOSIS — I10 Essential (primary) hypertension: Secondary | ICD-10-CM | POA: Diagnosis present

## 2013-03-17 DIAGNOSIS — K219 Gastro-esophageal reflux disease without esophagitis: Secondary | ICD-10-CM | POA: Diagnosis present

## 2013-03-17 DIAGNOSIS — F3289 Other specified depressive episodes: Secondary | ICD-10-CM | POA: Diagnosis present

## 2013-03-17 DIAGNOSIS — Z96653 Presence of artificial knee joint, bilateral: Secondary | ICD-10-CM

## 2013-03-17 DIAGNOSIS — E871 Hypo-osmolality and hyponatremia: Secondary | ICD-10-CM | POA: Diagnosis not present

## 2013-03-17 DIAGNOSIS — F329 Major depressive disorder, single episode, unspecified: Secondary | ICD-10-CM | POA: Diagnosis present

## 2013-03-17 HISTORY — PX: TOTAL KNEE ARTHROPLASTY: SHX125

## 2013-03-17 LAB — ABO/RH: ABO/RH(D): O POS

## 2013-03-17 LAB — TYPE AND SCREEN
ABO/RH(D): O POS
Antibody Screen: NEGATIVE

## 2013-03-17 SURGERY — ARTHROPLASTY, KNEE, BILATERAL, TOTAL
Anesthesia: General | Site: Knee | Laterality: Bilateral | Wound class: Clean

## 2013-03-17 MED ORDER — MENTHOL 3 MG MT LOZG: 1.0000 | LOZENGE | OROMUCOSAL | Status: DC | PRN

## 2013-03-17 MED ORDER — DOCUSATE SODIUM 100 MG PO CAPS
100.0000 mg | ORAL_CAPSULE | Freq: Two times a day (BID) | ORAL | Status: DC
  Administered 2013-03-17 – 2013-03-22 (×10): 100 mg via ORAL

## 2013-03-17 MED ORDER — ROCURONIUM BROMIDE 100 MG/10ML IV SOLN
INTRAVENOUS | Status: DC | PRN
  Administered 2013-03-17: 50 mg via INTRAVENOUS

## 2013-03-17 MED ORDER — DEXAMETHASONE 6 MG PO TABS
10.0000 mg | ORAL_TABLET | Freq: Every day | ORAL | Status: AC
  Administered 2013-03-18: 10 mg via ORAL
  Filled 2013-03-17: qty 1

## 2013-03-17 MED ORDER — GLYCOPYRROLATE 0.2 MG/ML IJ SOLN
INTRAMUSCULAR | Status: DC | PRN
  Administered 2013-03-17: .6 mg via INTRAVENOUS

## 2013-03-17 MED ORDER — LACTATED RINGERS IV SOLN: INTRAVENOUS | Status: DC

## 2013-03-17 MED ORDER — FENTANYL CITRATE 0.05 MG/ML IJ SOLN
INTRAMUSCULAR | Status: DC | PRN
  Administered 2013-03-17: 50 ug via INTRAVENOUS
  Administered 2013-03-17: 100 ug via INTRAVENOUS
  Administered 2013-03-17: 50 ug via INTRAVENOUS
  Administered 2013-03-17: 100 ug via INTRAVENOUS
  Administered 2013-03-17: 50 ug via INTRAVENOUS

## 2013-03-17 MED ORDER — FLEET ENEMA 7-19 GM/118ML RE ENEM: 1.0000 | ENEMA | Freq: Once | RECTAL | Status: AC | PRN

## 2013-03-17 MED ORDER — CEFAZOLIN SODIUM 1-5 GM-% IV SOLN
1.0000 g | Freq: Four times a day (QID) | INTRAVENOUS | Status: AC
  Administered 2013-03-17 (×2): 1 g via INTRAVENOUS
  Filled 2013-03-17 (×2): qty 50

## 2013-03-17 MED ORDER — ACETAMINOPHEN 500 MG PO TABS
1000.0000 mg | ORAL_TABLET | Freq: Once | ORAL | Status: AC
  Administered 2013-03-17: 1000 mg via ORAL
  Filled 2013-03-17: qty 2

## 2013-03-17 MED ORDER — METOCLOPRAMIDE HCL 5 MG/ML IJ SOLN: 5.0000 mg | Freq: Three times a day (TID) | INTRAMUSCULAR | Status: DC | PRN

## 2013-03-17 MED ORDER — ONDANSETRON HCL 4 MG/2ML IJ SOLN
INTRAMUSCULAR | Status: DC | PRN
  Administered 2013-03-17: 4 mg via INTRAVENOUS

## 2013-03-17 MED ORDER — POLYETHYLENE GLYCOL 3350 17 G PO PACK
17.0000 g | PACK | Freq: Every day | ORAL | Status: DC
  Administered 2013-03-19 – 2013-03-22 (×5): 17 g via ORAL

## 2013-03-17 MED ORDER — SODIUM CHLORIDE 0.9 % IV SOLN: INTRAVENOUS | Status: DC

## 2013-03-17 MED ORDER — SODIUM CHLORIDE 0.9 % IV SOLN
INTRAVENOUS | Status: DC
  Administered 2013-03-17 – 2013-03-19 (×4): via INTRAVENOUS

## 2013-03-17 MED ORDER — SODIUM CHLORIDE 0.9 % IV BOLUS (SEPSIS)
1000.0000 mL | Freq: Once | INTRAVENOUS | Status: AC
  Administered 2013-03-17: 1000 mL via INTRAVENOUS

## 2013-03-17 MED ORDER — PANTOPRAZOLE SODIUM 40 MG PO TBEC
40.0000 mg | DELAYED_RELEASE_TABLET | Freq: Every day | ORAL | Status: DC
  Administered 2013-03-17 – 2013-03-22 (×6): 40 mg via ORAL
  Filled 2013-03-17 (×6): qty 1

## 2013-03-17 MED ORDER — PROPOFOL 10 MG/ML IV BOLUS
INTRAVENOUS | Status: DC | PRN
  Administered 2013-03-17: 150 mg via INTRAVENOUS

## 2013-03-17 MED ORDER — ONDANSETRON HCL 4 MG/2ML IJ SOLN
4.0000 mg | Freq: Four times a day (QID) | INTRAMUSCULAR | Status: DC | PRN
  Administered 2013-03-17: 4 mg via INTRAVENOUS
  Filled 2013-03-17: qty 2

## 2013-03-17 MED ORDER — MIDAZOLAM HCL 5 MG/5ML IJ SOLN
INTRAMUSCULAR | Status: DC | PRN
  Administered 2013-03-17: 1 mg via INTRAVENOUS
  Administered 2013-03-17: 2 mg via INTRAVENOUS
  Administered 2013-03-17: 1 mg via INTRAVENOUS

## 2013-03-17 MED ORDER — BUPIVACAINE HCL (PF) 0.25 % IJ SOLN
INTRAMUSCULAR | Status: DC | PRN
  Administered 2013-03-17 (×3): 5 mL

## 2013-03-17 MED ORDER — BISACODYL 10 MG RE SUPP: 10.0000 mg | Freq: Every day | RECTAL | Status: DC | PRN

## 2013-03-17 MED ORDER — SODIUM CHLORIDE 0.9 % IR SOLN
Status: DC | PRN
  Administered 2013-03-17: 3000 mL

## 2013-03-17 MED ORDER — OXYCODONE HCL 5 MG PO TABS
5.0000 mg | ORAL_TABLET | ORAL | Status: DC | PRN
  Administered 2013-03-18 (×2): 20 mg via ORAL
  Administered 2013-03-18: 10 mg via ORAL
  Administered 2013-03-18: 15 mg via ORAL
  Administered 2013-03-18 – 2013-03-19 (×4): 20 mg via ORAL
  Administered 2013-03-19: 5 mg via ORAL
  Administered 2013-03-19: 20 mg via ORAL
  Administered 2013-03-20 (×2): 10 mg via ORAL
  Administered 2013-03-20: 5 mg via ORAL
  Filled 2013-03-17: qty 4
  Filled 2013-03-17: qty 2
  Filled 2013-03-17: qty 1
  Filled 2013-03-17: qty 4
  Filled 2013-03-17: qty 1
  Filled 2013-03-17 (×3): qty 4
  Filled 2013-03-17: qty 2
  Filled 2013-03-17: qty 4
  Filled 2013-03-17: qty 3
  Filled 2013-03-17: qty 4
  Filled 2013-03-17: qty 2

## 2013-03-17 MED ORDER — LIDOCAINE HCL (PF) 2 % IJ SOLN
INTRAMUSCULAR | Status: DC | PRN
  Administered 2013-03-17: 75 mg

## 2013-03-17 MED ORDER — ONDANSETRON HCL 4 MG PO TABS: 4.0000 mg | ORAL_TABLET | Freq: Four times a day (QID) | ORAL | Status: DC | PRN

## 2013-03-17 MED ORDER — ACETAMINOPHEN 650 MG RE SUPP
650.0000 mg | Freq: Four times a day (QID) | RECTAL | Status: AC
  Filled 2013-03-17: qty 1

## 2013-03-17 MED ORDER — PHENOL 1.4 % MT LIQD: 1.0000 | OROMUCOSAL | Status: DC | PRN

## 2013-03-17 MED ORDER — METHOCARBAMOL 100 MG/ML IJ SOLN
500.0000 mg | Freq: Four times a day (QID) | INTRAVENOUS | Status: DC | PRN
  Administered 2013-03-17 (×2): 500 mg via INTRAVENOUS
  Filled 2013-03-17 (×2): qty 5

## 2013-03-17 MED ORDER — DEXAMETHASONE SODIUM PHOSPHATE 10 MG/ML IJ SOLN
10.0000 mg | Freq: Every day | INTRAMUSCULAR | Status: AC
  Filled 2013-03-17: qty 1

## 2013-03-17 MED ORDER — MORPHINE SULFATE 2 MG/ML IJ SOLN: 1.0000 mg | INTRAMUSCULAR | Status: DC | PRN

## 2013-03-17 MED ORDER — SODIUM CHLORIDE 0.9 % IV SOLN
INTRAVENOUS | Status: DC
  Administered 2013-03-17: 14:00:00 via EPIDURAL
  Administered 2013-03-18: 12 mL/h via EPIDURAL
  Administered 2013-03-18: 13:00:00 via EPIDURAL
  Administered 2013-03-19: 14 mL/h via EPIDURAL
  Filled 2013-03-17 (×14): qty 20

## 2013-03-17 MED ORDER — ATORVASTATIN CALCIUM 40 MG PO TABS
40.0000 mg | ORAL_TABLET | Freq: Every day | ORAL | Status: DC
  Administered 2013-03-17 – 2013-03-21 (×5): 40 mg via ORAL
  Filled 2013-03-17 (×6): qty 1

## 2013-03-17 MED ORDER — NEOSTIGMINE METHYLSULFATE 1 MG/ML IJ SOLN
INTRAMUSCULAR | Status: DC | PRN
  Administered 2013-03-17: 4 mg via INTRAVENOUS

## 2013-03-17 MED ORDER — TRANEXAMIC ACID 100 MG/ML IV SOLN
1000.0000 mg | INTRAVENOUS | Status: AC
  Administered 2013-03-17: 1000 mg via INTRAVENOUS
  Filled 2013-03-17: qty 10

## 2013-03-17 MED ORDER — HYDROMORPHONE HCL PF 1 MG/ML IJ SOLN
0.2500 mg | INTRAMUSCULAR | Status: DC | PRN
  Administered 2013-03-17: 0.25 mg via INTRAVENOUS
  Administered 2013-03-17: 0.5 mg via INTRAVENOUS
  Administered 2013-03-17: 0.25 mg via INTRAVENOUS

## 2013-03-17 MED ORDER — METHOCARBAMOL 500 MG PO TABS
500.0000 mg | ORAL_TABLET | Freq: Four times a day (QID) | ORAL | Status: DC | PRN
  Administered 2013-03-18 – 2013-03-22 (×11): 500 mg via ORAL
  Filled 2013-03-17 (×11): qty 1

## 2013-03-17 MED ORDER — METOCLOPRAMIDE HCL 10 MG PO TABS: 5.0000 mg | ORAL_TABLET | Freq: Three times a day (TID) | ORAL | Status: DC | PRN

## 2013-03-17 MED ORDER — DIPHENHYDRAMINE HCL 12.5 MG/5ML PO ELIX
12.5000 mg | ORAL_SOLUTION | ORAL | Status: DC | PRN
  Administered 2013-03-19 – 2013-03-20 (×4): 12.5 mg via ORAL
  Filled 2013-03-17: qty 10
  Filled 2013-03-17 (×2): qty 5

## 2013-03-17 MED ORDER — ACETAMINOPHEN 500 MG PO TABS
1000.0000 mg | ORAL_TABLET | Freq: Four times a day (QID) | ORAL | Status: AC
  Administered 2013-03-17 – 2013-03-18 (×4): 1000 mg via ORAL
  Filled 2013-03-17 (×4): qty 2

## 2013-03-17 MED ORDER — CITALOPRAM HYDROBROMIDE 10 MG PO TABS
10.0000 mg | ORAL_TABLET | Freq: Every morning | ORAL | Status: DC
  Administered 2013-03-18 – 2013-03-22 (×5): 10 mg via ORAL
  Filled 2013-03-17 (×5): qty 1

## 2013-03-17 MED ORDER — LACTATED RINGERS IV SOLN
INTRAVENOUS | Status: DC
  Administered 2013-03-17 (×3): via INTRAVENOUS

## 2013-03-17 MED ORDER — CEFAZOLIN SODIUM-DEXTROSE 2-3 GM-% IV SOLR
2.0000 g | INTRAVENOUS | Status: AC
  Administered 2013-03-17: 2 g via INTRAVENOUS

## 2013-03-17 MED ORDER — HYDROMORPHONE HCL PF 1 MG/ML IJ SOLN
INTRAMUSCULAR | Status: DC | PRN
  Administered 2013-03-17 (×3): 0.5 mg via INTRAVENOUS

## 2013-03-17 MED ORDER — 0.9 % SODIUM CHLORIDE (POUR BTL) OPTIME
TOPICAL | Status: DC | PRN
  Administered 2013-03-17: 1000 mL

## 2013-03-17 MED ORDER — TRAMADOL HCL 50 MG PO TABS
50.0000 mg | ORAL_TABLET | Freq: Four times a day (QID) | ORAL | Status: DC | PRN
  Administered 2013-03-17 – 2013-03-19 (×3): 100 mg via ORAL
  Administered 2013-03-19: 50 mg via ORAL
  Administered 2013-03-19: 100 mg via ORAL
  Filled 2013-03-17: qty 2
  Filled 2013-03-17: qty 1
  Filled 2013-03-17: qty 2
  Filled 2013-03-17: qty 1
  Filled 2013-03-17: qty 2

## 2013-03-17 MED ORDER — TRIAMTERENE-HCTZ 37.5-25 MG PO TABS
0.5000 | ORAL_TABLET | Freq: Every morning | ORAL | Status: DC
  Administered 2013-03-18 – 2013-03-21 (×4): 0.5 via ORAL
  Filled 2013-03-17 (×5): qty 0.5

## 2013-03-17 SURGICAL SUPPLY — 55 items
BAG SPEC THK2 15X12 ZIP CLS (MISCELLANEOUS)
BAG ZIPLOCK 12X15 (MISCELLANEOUS) ×2 IMPLANT
BANDAGE ELASTIC 6 VELCRO ST LF (GAUZE/BANDAGES/DRESSINGS) ×4 IMPLANT
BANDAGE ESMARK 6X9 LF (GAUZE/BANDAGES/DRESSINGS) ×2 IMPLANT
BLADE SAG 18X100X1.27 (BLADE) ×2 IMPLANT
BLADE SAW SGTL 11.0X1.19X90.0M (BLADE) ×2 IMPLANT
BLADE SURG SZ10 CARB STEEL (BLADE) ×4 IMPLANT
BNDG CMPR 9X6 STRL LF SNTH (GAUZE/BANDAGES/DRESSINGS) ×2
BNDG COHESIVE 6X5 TAN STRL LF (GAUZE/BANDAGES/DRESSINGS) ×2 IMPLANT
BNDG ESMARK 6X9 LF (GAUZE/BANDAGES/DRESSINGS) ×4
BOWL SMART MIX CTS (DISPOSABLE) ×4 IMPLANT
CAPT RP KNEE ×2 IMPLANT
CEMENT HV SMART SET (Cement) ×8 IMPLANT
CLOTH BEACON ORANGE TIMEOUT ST (SAFETY) ×2 IMPLANT
CUFF TOURN SGL QUICK 34 (TOURNIQUET CUFF) ×4
CUFF TRNQT CYL 34X4X40X1 (TOURNIQUET CUFF) ×2 IMPLANT
DRAPE EXTREMITY BILATERAL (DRAPE) ×2 IMPLANT
DRAPE INCISE IOBAN 66X45 STRL (DRAPES) ×2 IMPLANT
DRAPE POUCH INSTRU U-SHP 10X18 (DRAPES) ×2 IMPLANT
DRAPE U-SHAPE 47X51 STRL (DRAPES) ×6 IMPLANT
DRSG ADAPTIC 3X8 NADH LF (GAUZE/BANDAGES/DRESSINGS) ×4 IMPLANT
DRSG PAD ABDOMINAL 8X10 ST (GAUZE/BANDAGES/DRESSINGS) ×4 IMPLANT
DURAPREP 26ML APPLICATOR (WOUND CARE) ×4 IMPLANT
ELECT REM PT RETURN 9FT ADLT (ELECTROSURGICAL) ×2
ELECTRODE REM PT RTRN 9FT ADLT (ELECTROSURGICAL) ×1 IMPLANT
EVACUATOR 1/8 PVC DRAIN (DRAIN) ×2 IMPLANT
FACESHIELD LNG OPTICON STERILE (SAFETY) ×14 IMPLANT
GLOVE BIO SURGEON STRL SZ7.5 (GLOVE) ×6 IMPLANT
GLOVE BIO SURGEON STRL SZ8 (GLOVE) ×4 IMPLANT
GLOVE BIOGEL PI IND STRL 8 (GLOVE) ×2 IMPLANT
GLOVE BIOGEL PI INDICATOR 8 (GLOVE) ×2
GOWN STRL NON-REIN LRG LVL3 (GOWN DISPOSABLE) ×2 IMPLANT
GOWN STRL REIN XL XLG (GOWN DISPOSABLE) ×3 IMPLANT
HANDPIECE INTERPULSE COAX TIP (DISPOSABLE) ×2
IMMOBILIZER KNEE 20 (SOFTGOODS) ×4
IMMOBILIZER KNEE 20 THIGH 36 (SOFTGOODS) ×2 IMPLANT
KIT BASIN OR (CUSTOM PROCEDURE TRAY) ×2 IMPLANT
MANIFOLD NEPTUNE II (INSTRUMENTS) ×2 IMPLANT
NS IRRIG 1000ML POUR BTL (IV SOLUTION) ×2 IMPLANT
PACK TOTAL JOINT (CUSTOM PROCEDURE TRAY) ×2 IMPLANT
PADDING CAST COTTON 6X4 STRL (CAST SUPPLIES) ×11 IMPLANT
SET HNDPC FAN SPRY TIP SCT (DISPOSABLE) ×1 IMPLANT
SPONGE GAUZE 4X4 12PLY (GAUZE/BANDAGES/DRESSINGS) ×3 IMPLANT
SPONGE LAP 18X18 X RAY DECT (DISPOSABLE) ×1 IMPLANT
STOCKINETTE 8 INCH (MISCELLANEOUS) ×2 IMPLANT
STRIP CLOSURE SKIN 1/2X4 (GAUZE/BANDAGES/DRESSINGS) ×8 IMPLANT
SUCTION FRAZIER 12FR DISP (SUCTIONS) ×2 IMPLANT
SUT MNCRL AB 4-0 PS2 18 (SUTURE) ×4 IMPLANT
SUT VIC AB 2-0 CT1 27 (SUTURE) ×12
SUT VIC AB 2-0 CT1 TAPERPNT 27 (SUTURE) ×6 IMPLANT
SUT VLOC 180 0 24IN GS25 (SUTURE) ×4 IMPLANT
TOWEL OR 17X26 10 PK STRL BLUE (TOWEL DISPOSABLE) ×4 IMPLANT
TRAY FOLEY CATH 14FRSI W/METER (CATHETERS) ×2 IMPLANT
WATER STERILE IRR 1500ML POUR (IV SOLUTION) ×3 IMPLANT
WRAP KNEE MAXI GEL POST OP (GAUZE/BANDAGES/DRESSINGS) ×6 IMPLANT

## 2013-03-17 NOTE — Anesthesia Preprocedure Evaluation (Addendum)
Anesthesia Evaluation  Patient identified by MRN, date of birth, ID band Patient awake    Reviewed: Allergy & Precautions, H&P , NPO status , Patient's Chart, lab work & pertinent test results  Airway Mallampati: II TM Distance: >3 FB Neck ROM: full    Dental no notable dental hx. (+) Teeth Intact and Dental Advisory Given   Pulmonary neg pulmonary ROS,  breath sounds clear to auscultation  Pulmonary exam normal       Cardiovascular Exercise Tolerance: Good hypertension, Pt. on medications Rhythm:regular Rate:Normal     Neuro/Psych negative neurological ROS  negative psych ROS   GI/Hepatic negative GI ROS, Neg liver ROS, GERD-  Medicated and Controlled,  Endo/Other  negative endocrine ROS  Renal/GU negative Renal ROS  negative genitourinary   Musculoskeletal   Abdominal   Peds  Hematology negative hematology ROS (+)   Anesthesia Other Findings   Reproductive/Obstetrics negative OB ROS                          Anesthesia Physical Anesthesia Plan  ASA: II  Anesthesia Plan: General   Post-op Pain Management:    Induction: Intravenous  Airway Management Planned: Oral ETT  Additional Equipment:   Intra-op Plan:   Post-operative Plan: Extubation in OR  Informed Consent: I have reviewed the patients History and Physical, chart, labs and discussed the procedure including the risks, benefits and alternatives for the proposed anesthesia with the patient or authorized representative who has indicated his/her understanding and acceptance.   Dental Advisory Given  Plan Discussed with: CRNA and Surgeon  Anesthesia Plan Comments:         Anesthesia Quick Evaluation  

## 2013-03-17 NOTE — Op Note (Signed)
Pre-operative diagnosis- Osteoarthritis  Bilateral knee(s)  Post-operative diagnosis- Osteoarthritis Bilateral knee(s)  Procedure-  Bilateral  Total Knee Arthroplasty  Surgeon- Gus Rankin. Kennon Encinas, MD  Assistant- Sarita Bottom, PA-C   Anesthesia-  General and Epidural EBL-* No blood loss amount entered *  Drains Hemovac  Tourniquet time-  Total Tourniquet Time Documented: Thigh (Right) - 32 minutes Total: Thigh (Right) - 32 minutes  Thigh (Left) - 36 minutes Total: Thigh (Left) - 36 minutes    Complications- None  Condition-PACU - hemodynamically stable.   Brief Clinical Note  Jennifer Sanford is a 62 y.o. year old female with end stage OA of both knees with progressively worsening pain and dysfunction. She has constant pain, with activity and at rest and significant functional deficits with difficulties even with ADLs. She has had extensive non-op management including analgesics, injections of cortisone and viscosupplements, and home exercise program, but remains in significant pain with significant dysfunction. Radiographs show bone on bone arthritis medial and patellofemoral compartments both knees. We discussed doing bilateral TKAs in the same setting versus staging the knees one at a time and the procedure, risks and potential complications associated with each. She presents now for bilateral Total Knee Arthroplasty.     Procedure in detail---   The patient is brought into the operating room and positioned supine on the operating table. After successful administration of  General and Epidural,   a tourniquet is placed high on the  Bilateral thigh(s) and the right.lower extremity is prepped and draped in the usual sterile fashion. Time out is performed by the operating team and then the  Right lower extremity is wrapped in Esmarch, knee flexed and the tourniquet inflated to 300 mmHg.       A midline incision is made with a ten blade through the subcutaneous tissue to the level of the  extensor mechanism. A fresh blade is used to make a medial parapatellar arthrotomy. Soft tissue over the proximal medial tibia is subperiosteally elevated to the joint line with a knife and into the semimembranosus bursa with a Cobb elevator. Soft tissue over the proximal lateral tibia is elevated with attention being paid to avoiding the patellar tendon on the tibial tubercle. The patella is everted, knee flexed 90 degrees and the ACL and PCL are removed. Findings are bone on bone medial and patellofemoral with large medial osteophytes.        The drill is used to create a starting hole in the distal femur and the canal is thoroughly irrigated with sterile saline to remove the fatty contents. The 5 degree Right  valgus alignment guide is placed into the femoral canal and the distal femoral cutting block is pinned to remove 10 mm off the distal femur. Resection is made with an oscillating saw.      The tibia is subluxed forward and the menisci are removed. The extramedullary alignment guide is placed referencing proximally at the medial aspect of the tibial tubercle and distally along the second metatarsal axis and tibial crest. The block is pinned to remove 2mm off the more deficient medial  side. Resection is made with an oscillating saw. Size 4is the most appropriate size for the tibia and the proximal tibia is prepared with the modular drill and keel punch for that size.      The femoral sizing guide is placed and size 4 is most appropriate. Rotation is marked off the epicondylar axis and confirmed by creating a rectangular flexion gap at 90 degrees. The  size 4 cutting block is pinned in this rotation and the anterior, posterior and chamfer cuts are made with the oscillating saw. The intercondylar block is then placed and that cut is made.      Trial size 4 tibial component, trial size 4 posterior stabilized femur and a 10  mm posterior stabilized rotating platform insert trial is placed. Full extension is  achieved with excellent varus/valgus and anterior/posterior balance throughout full range of motion. The patella is everted and thickness measured to be 22  mm. Free hand resection is taken to 12 mm, a 38 template is placed, lug holes are drilled, trial patella is placed, and it tracks normally. Osteophytes are removed off the posterior femur with the trial in place. All trials are removed and the cut bone surfaces prepared with pulsatile lavage. Cement is mixed and once ready for implantation, the size 4 tibial implant, size  4 posterior stabilized femoral component, and the size 38 patella are cemented in place and the patella is held with the clamp. The trial insert is placed and the knee held in full extension. The Exparel (20 ml mixed with 30 ml saline) and .25% Bupivicaine, are injected into the extensor mechanism, posterior capsule, medial and lateral gutters and subcutaneous tissues.  All extruded cement is removed and once the cement is hard the permanent 10 mm posterior stabilized rotating platform insert is placed into the tibial tray.      The wound is copiously irrigated with saline solution and the extensor mechanism closed over a hemovac drain with #1 PDS suture. The tourniquet is released for a total tourniquet time of 32  minutes. Flexion against gravity is 140 degrees and the patella tracks normally. Subcutaneous tissue is closed with 2.0 vicryl and subcuticular with running 4.0 Monocryl.       The  Left lower extremity is wrapped in Esmarch, knee flexed and the tourniquet inflated to 300 mmHg.       A midline incision is made with a ten blade through the subcutaneous tissue to the level of the extensor mechanism. A fresh blade is used to make a medial parapatellar arthrotomy. Soft tissue over the proximal medial tibia is subperiosteally elevated to the joint line with a knife and into the semimembranosus bursa with a Cobb elevator. Soft tissue over the proximal lateral tibia is elevated with  attention being paid to avoiding the patellar tendon on the tibial tubercle. The patella is everted, knee flexed 90 degrees and the ACL and PCL are removed. Findings are  bone on bone medial and patellofemoral with large medial osteophytes.     The drill is used to create a starting hole in the distal femur and the canal is thoroughly irrigated with sterile saline to remove the fatty contents. The 5 degree Left  valgus alignment guide is placed into the femoral canal and the distal femoral cutting block is pinned to remove 10 mm off the distal femur. Resection is made with an oscillating saw.      The tibia is subluxed forward and the menisci are removed. The extramedullary alignment guide is placed referencing proximally at the medial aspect of the tibial tubercle and distally along the second metatarsal axis and tibial crest. The block is pinned to remove 2mm off the more deficient medial  side. Resection is made with an oscillating saw. Size 4is the most appropriate size for the tibia and the proximal tibia is prepared with the modular drill and keel punch for that size.  The femoral sizing guide is placed and size 4 is most appropriate. Rotation is marked off the epicondylar axis and confirmed by creating a rectangular flexion gap at 90 degrees. The size 4 cutting block is pinned in this rotation and the anterior, posterior and chamfer cuts are made with the oscillating saw. The intercondylar block is then placed and that cut is made.      Trial size 4 tibial component, trial size 4 posterior stabilized femur and a 10  mm posterior stabilized rotating platform insert trial is placed. Full extension is achieved with excellent varus/valgus and anterior/posterior balance throughout full range of motion. The patella is everted and thickness measured to be 22  mm. Free hand resection is taken to 12 mm, a 38 template is placed, lug holes are drilled, trial patella is placed, and it tracks normally. Osteophytes  are removed off the posterior femur with the trial in place. All trials are removed and the cut bone surfaces prepared with pulsatile lavage. Cement is mixed and once ready for implantation, the size 4 tibial implant, size  4 posterior stabilized femoral component, and the size 38 patella are cemented in place and the patella is held with the clamp. The trial insert is placed and the knee held in full extension. The Exparel (20 ml mixed with 30 ml saline) and .25% Bupivicaine, are injected into the extensor mechanism, posterior capsule, medial and lateral gutters and subcutaneous tissues.  All extruded cement is removed and once the cement is hard the permanent 10 mm posterior stabilized rotating platform insert is placed into the tibial tray.      The wound is copiously irrigated with saline solution and the extensor mechanism closed over a hemovac drain with #1 PDS suture. The tourniquet is released for a total tourniquet time of 36  minutes. Flexion against gravity is 140 degrees and the patella tracks normally. Subcutaneous tissue is closed with 2.0 vicryl and subcuticular with running 4.0 Monocryl.The incision is cleaned and dried and steri-strips and a bulky sterile dressing are applied. The limb is placed into a knee immobilizer and the patient is awakened and transported to recovery in stable condition.      Please note that a surgical assistant was a medical necessity for this procedure in order to perform it in a safe and expeditious manner. Surgical assistant was necessary to retract the ligaments and vital neurovascular structures to prevent injury to them and also necessary for proper positioning of the limb to allow for anatomic placement of the prosthesis.   Gus Rankin Jennifer Blossom, MD    03/17/2013, 12:02 PM

## 2013-03-17 NOTE — Consult Note (Signed)
Physical Medicine and Rehabilitation Consult Reason for Consult: Bilat TKA Referring Physician: Dr. Despina Hick   HPI: Jennifer Sanford is a 62 y.o. right-handed female admitted 03/16/2013 with end stage changes bilateral knees and no relief with conservative care. 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 Bilateral total knee arthroplasty 03/17/2013 per Dr.Alusio. Placed on Coumadin for DVT prophylaxis advised weightbearing as tolerated. Acute blood loss anemia latest hemoglobin 9.7 and monitored. Physical and occupational therapy evaluations pending. M.D. is requested physical medicine rehabilitation consult to consider inpatient rehabilitation services  Patient tried getting up with two-person assist today however she had orthostatic hypotension Pain is poorly controlled at this point. Takes oral medications in addition to her epidural. Both legs feel heavy Review of Systems  Gastrointestinal:       GERD  Musculoskeletal: Positive for myalgias and joint pain.  Psychiatric/Behavioral: Positive for depression.  All other systems reviewed and are negative.   Past Medical History  Diagnosis Date  . Hypertension   . Hypercholesteremia   . Arthritis   . Depression   . GERD (gastroesophageal reflux disease)    Past Surgical History  Procedure Laterality Date  . Knee arthroscopy Left 2004  . Abdominal hysterectomy  2002    partial  . Breast reduction surgery  2007  . Tonsillectomy  as child   Family History  Problem Relation Age of Onset  . Diabetes Mother   . Hypertension Father   . Cancer Father   . Cancer Other    Social History:  reports that she has never smoked. She has never used smokeless tobacco. She reports that she does not drink alcohol or use illicit drugs. Allergies: No Known Allergies Medications Prior to Admission  Medication Sig Dispense Refill  . citalopram (CELEXA) 10 MG tablet Take 10 mg by mouth every morning.       . fish oil-omega-3  fatty acids 1000 MG capsule Take 1 g by mouth daily.      . pantoprazole (PROTONIX) 40 MG tablet Take 40 mg by mouth daily.      . rosuvastatin (CRESTOR) 20 MG tablet Take 20 mg by mouth every morning.       . triamterene-hydrochlorothiazide (MAXZIDE-25) 37.5-25 MG per tablet Take 0.5 tablets by mouth every morning.       . vitamin B-12 (CYANOCOBALAMIN) 1000 MCG tablet Take 1,000 mcg by mouth daily.      . celecoxib (CELEBREX) 400 MG capsule Take 400 mg by mouth daily.      Marland Kitchen OVER THE COUNTER MEDICATION Take 1 tablet by mouth daily. ultrim-weight loss supplement        Home:    Functional History:   Functional Status:  Mobility:          ADL:    Cognition: Cognition Orientation Level: Oriented X4    Blood pressure 125/85, pulse 82, temperature 97.4 F (36.3 C), temperature source Oral, resp. rate 14, SpO2 98.00%. Physical Exam  Vitals reviewed. HENT:  Head: Normocephalic.  Eyes: EOM are normal.  Neck: Normal range of motion. Neck supple. No thyromegaly present.  Cardiovascular: Normal rate and regular rhythm.   Pulmonary/Chest: Breath sounds normal. No respiratory distress.  Abdominal: Soft. Bowel sounds are normal. She exhibits no distension.  Neurological:  Patient is lethargic but arousable. She participated fully with exam. Patient was oriented x3 To Person, place and date of birth.  Skin:  Bilateral knee incisions were dressed. Neurovascular sensation intact   motor strength 5/5 in  bilateral deltoid, biceps, triceps, grip Lower extremity strength 3 minus left hip flexion knee extension 4/5 bilateral dorsiflexion plantar flexion trace right hip flexion knee extension Sensory intact to light touch in both feet  Results for orders placed during the hospital encounter of 03/17/13 (from the past 24 hour(s))  TYPE AND SCREEN     Status: None   Collection Time    03/17/13  7:45 AM      Result Value Range   ABO/RH(D) O POS     Antibody Screen NEG     Sample  Expiration 03/20/2013    ABO/RH     Status: None   Collection Time    03/17/13  7:45 AM      Result Value Range   ABO/RH(D) O POS     No results found.  Assessment/Plan: Diagnosis: End-stage osteoarthritis of both knees status post bilateral total knee replacement postoperative day #1 1. Does the need for close, 24 hr/day medical supervision in concert with the patient's rehab needs make it unreasonable for this patient to be served in a less intensive setting? Potentially 2. Co-Morbidities requiring supervision/potential complications: Postoperative anemia, postoperative pain management 3. Due to bowel management, safety, skin/wound care, disease management, medication administration, pain management and patient education, does the patient require 24 hr/day rehab nursing? Potentially 4. Does the patient require coordinated care of a physician, rehab nurse, PT (1-2 hrs/day, 5 days/week) and OT (1-2 hrs/day, 5 days/week) to address physical and functional deficits in the context of the above medical diagnosis(es)? Potentially Addressing deficits in the following areas: balance, endurance, locomotion, strength, transferring, bowel/bladder control, bathing, dressing, feeding, grooming and toileting 5. Can the patient actively participate in an intensive therapy program of at least 3 hrs of therapy per day at least 5 days per week? Potentially 6. The potential for patient to make measurable gains while on inpatient rehab is excellent 7. Anticipated functional outcomes upon discharge from inpatient rehab are modified independent mobility with PT, modified independent ADLs with OT, not applicable with SLP. 8. Estimated rehab length of stay to reach the above functional goals is: 7 days 9. Does the patient have adequate social supports to accommodate these discharge functional goals? Yes 10. Anticipated D/C setting: Home 11. Anticipated post D/C treatments: HH therapy 12. Overall Rehab/Functional  Prognosis: excellent  RECOMMENDATIONS: This patient's condition is appropriate for continued rehabilitative care in the following setting: CIR if still requiring physical assistance after her epidural catheter removed Patient has agreed to participate in recommended program. Potentially Note that insurance prior authorization may be required for reimbursement for recommended care.  Comment: Rehabilitation RN to followup on therapy progress. Will need physical therapy reassessment once orthostatic hypotension is improved    03/17/2013

## 2013-03-17 NOTE — Anesthesia Procedure Notes (Signed)
Epidural Patient location during procedure: holding area Start time: 03/17/2013 9:30 AM End time: 03/17/2013 9:46 AM  Staffing Anesthesiologist: Ronelle Nigh L Performed by: anesthesiologist   Preanesthetic Checklist Completed: patient identified, site marked, surgical consent, pre-op evaluation, timeout performed, IV checked, risks and benefits discussed, monitors and equipment checked and post-op pain management  Epidural Patient position: sitting Prep: Betadine Patient monitoring: heart rate, continuous pulse ox and blood pressure Approach: midline Injection technique: LOR air  Needle:  Needle type: Hustead  Needle gauge: 18 G Needle length: 9 cm and 9 Catheter type: closed end flexible Catheter size: 20 Guage Test dose: negative and 1.5% lidocaine  Assessment Sensory level: T10  Additional Notes Test dose 1.5% Lidocaine with epi 1:200,000  Patient tolerated the insertion well without complications.Reason for block:post-op pain management

## 2013-03-17 NOTE — Transfer of Care (Signed)
Immediate Anesthesia Transfer of Care Note  Patient: Jennifer Sanford  Procedure(s) Performed: Procedure(s): TOTAL KNEE BILATERAL (Bilateral)  Patient Location: PACU  Anesthesia Type:GA combined with regional for post-op pain  Level of Consciousness: awake and sedated  Airway & Oxygen Therapy: Patient Spontanous Breathing and Patient connected to face mask oxygen  Post-op Assessment: Report given to PACU RN and Post -op Vital signs reviewed and stable  Post vital signs: Reviewed and stable  Complications: No apparent anesthesia complications

## 2013-03-17 NOTE — Anesthesia Postprocedure Evaluation (Signed)
  Anesthesia Post-op Note  Patient: Jennifer Sanford  Procedure(s) Performed: Procedure(s) (LRB): TOTAL KNEE BILATERAL (Bilateral)  Patient Location: PACU  Anesthesia Type: GA combined with regional for post-op pain  Level of Consciousness: awake and alert   Airway and Oxygen Therapy: Patient Spontanous Breathing  Post-op Pain: mild  Post-op Assessment: Post-op Vital signs reviewed, Patient's Cardiovascular Status Stable, Respiratory Function Stable, Patent Airway and No signs of Nausea or vomiting  Last Vitals:  Filed Vitals:   03/17/13 1340  BP: 111/60  Pulse: 85  Temp:   Resp: 13    Post-op Vital Signs: stable   Complications: No apparent anesthesia complications

## 2013-03-17 NOTE — Interval H&P Note (Signed)
History and Physical Interval Note:  03/17/2013 9:01 AM  Jennifer Sanford  has presented today for surgery, with the diagnosis of OSTEOARTHRITIS BILATERAL KNEES  The various methods of treatment have been discussed with the patient and family. After consideration of risks, benefits and other options for treatment, the patient has consented to  Procedure(s): TOTAL KNEE BILATERAL (Bilateral) as a surgical intervention .  The patient's history has been reviewed, patient examined, no change in status, stable for surgery.  I have reviewed the patient's chart and labs.  Questions were answered to the patient's satisfaction.     Loanne Drilling

## 2013-03-17 NOTE — H&P (View-Only) (Signed)
Jennifer Sanford  DOB: 10-05-1950 Married / Language: [place] / Race: Black or African American Female  Date of Admission:  03/17/2013  Chief Complaint:  Bilateral Knee Pain  History of Present Illness The patient is a 62 year old female who comes in for a preoperative History and Physical. The patient is scheduled for a bilateral total knee arthroplasty to be performed by Dr. Gus Rankin. Aluisio, MD at [facility] on 03/17/2013. The patient is a 62 year old female who presents for follow up of their knee. The patient is being followed for their bilateral knee pain and osteoarthritis. They have been treated recently with cortisone injections. Symptoms reported today include: pain and swelling. Note for "Follow-up Knee": She says the Celebrex has helped, and she feels like she can "make do" if she can continue to take that. She has finally gotten the knees to settle down some. She knows she needs to have the knees replaced. The cortisone injections are helping less and less. She felt as though the Celebrex was very beneficial but continued to have limiting pain and discomfort. She is very active but her activity is becoming more and more limited due to her knees. She has reached a point where she is now ready to proceed with bilateral knee replacements at this time. They have been treated conservatively in the past for the above stated problem and despite conservative measures, they continue to have progressive pain and severe functional limitations and dysfunction. They have failed non-operative management including home exercise, medications, and injections. It is felt that they would benefit from undergoing total joint replacement. Risks and benefits of the procedure have been discussed with the patient and they elect to proceed with surgery. There are no active contraindications to surgery such as ongoing infection or rapidly progressive neurological disease.   Allergies No  Known Drug Allergies. 05/09/2011   Family History Father. Deceased, Lung Cancer. age 63 Mother. Deceased, Hypertension, Transient ischemic attack, Dementia. age 37   Social History Alcohol use. never consumed alcohol Children. 3 Current work status. working full time Drug/Alcohol Rehab (Currently). no Exercise. Exercises weekly; does running / walking and individual sport Illicit drug use. no Living situation. live alone Marital status. 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 4-5 Pain Contract. no Previously in rehab. no Tobacco / smoke exposure. yes outdoors only Tobacco use. Former smoker. former smoker; smoke(d) less than 1/2 pack(s) per day Post-Surgical Plans. Plan is for [facility]. Advance Directives. Heathcare POA   Medication History CeleBREX (200MG  Capsule, 1 (one) Capsule Oral daily, Taken starting 01/25/2013) Active. Fish Oil Concentrate (1000MG  Capsule, Oral) Active. Vitamin B12 ( Tablet, Oral) Active. CeleXA ( Oral) Specific dose unknown - Active. Crestor ( Oral) Specific dose unknown - Active. Protonix (40MG  Tablet DR, Oral) Active. Triamterene-HCTZ (37.5-25MG  Capsule, Oral) Active.   Past Surgical History  Arthroscopy of Knee. left Hysterectomy. partial (non-cancerous) Mammoplasty; Reduction. bilateral  Medical History Hypercholesterolemia Osteoarthritis Depression Gastroesophageal Reflux Disease High blood pressure Impaired Vision. Wears glasses   Review of Systems  General:Not Present- Chills, Fever, Night Sweats, Fatigue, Weight Gain, Weight Loss and Memory Loss. Skin:Not Present- Hives, Itching, Rash, Eczema and Lesions. HEENT:Not Present- Tinnitus, Headache, Double Vision, Visual Loss, Hearing Loss and Dentures. Respiratory:Not Present- Shortness of breath with exertion, Shortness of breath at rest, Allergies, Coughing  up blood and Chronic Cough. Cardiovascular:Not Present- Chest Pain, Racing/skipping heartbeats, Difficulty Breathing Lying Down, Murmur, Swelling and Palpitations. Gastrointestinal:Not  Present- Bloody Stool, Heartburn, Abdominal Pain, Vomiting, Nausea, Constipation, Diarrhea, Difficulty Swallowing, Jaundice and Loss of appetitie. Female Genitourinary:Not Present- Blood in Urine, Urinary frequency, Weak urinary stream, Discharge, Flank Pain, Incontinence, Painful Urination, Urgency, Urinary Retention and Urinating at Night. Musculoskeletal:Present- Joint Pain. Not Present- Muscle Weakness, Muscle Pain, Joint Swelling, Back Pain, Morning Stiffness and Spasms. Neurological:Not Present- Tremor, Dizziness, Blackout spells, Paralysis, Difficulty with balance and Weakness. Psychiatric:Not Present- Insomnia.   Vitals Weight: 220 lb Height: 71 in Weight was reported by patient. Height was reported by patient. Body Surface Area: 2.24 m Body Mass Index: 30.68 kg/m Pulse: 68 (Regular) Resp.: 14 (Unlabored) BP: 118/74 (Sitting, Right Arm, Standard)    Physical Exam  The physical exam findings are as follows:  Note: Patient is a 62 year old female with continued bilateral knee pain.   General Mental Status - Alert, cooperative and good historian. General Appearance- pleasant. Not in acute distress. Orientation- Oriented X3. Build & Nutrition- Well nourished and Well developed.   Head and Neck Head- normocephalic, atraumatic . Neck Global Assessment- supple. no bruit auscultated on the right and no bruit auscultated on the left.   Eye Pupil- Bilateral- Regular and Round. Motion- Bilateral- EOMI.   Chest and Lung Exam Auscultation: Breath sounds:- clear at anterior chest wall and - clear at posterior chest wall. Adventitious sounds:- No Adventitious sounds.   Cardiovascular Auscultation:Rhythm- Regular rate and rhythm. Heart Sounds- S1 WNL  and S2 WNL. Murmurs & Other Heart Sounds:Auscultation of the heart reveals - No Murmurs.   Abdomen Palpation/Percussion:Tenderness- Abdomen is non-tender to palpation. Rigidity (guarding)- Abdomen is soft. Auscultation:Auscultation of the abdomen reveals - Bowel sounds normal.   Female Genitourinary Not done, not pertinent to present illness  Musculoskeletal Very pleasant, well developed female, alert and oriented in no apparent distress. Both knees showed no effusion. She has marked crepitus. On range of motion of both knees range about 5 to 120 on both sides. There is no instability.  RADIOGRAPHS: AP and lateral of both knees show bone on bone patellofemoral arthritis on both sides. On the left knee, she is just about bone on bone medial right knee. She is bone on bone medial.  Assessment & Plan(Sidrah Harden L Hansini Clodfelter, III PA-C; 02/16/2013 10:32 AM) Primary osteoarthritis of both knees (715.16)  Note: Plan is for Bilateral Total Knee Replacements by Dr. Lequita Halt.  Plan is to go to [facility].  PCP - Dr. Renae Gloss - Patient has been seen preoperatively and felt to be stable for surgery.  Signed electronically by Lauraine Rinne, III PA-C

## 2013-03-18 ENCOUNTER — Encounter (HOSPITAL_COMMUNITY): Payer: Self-pay | Admitting: Orthopedic Surgery

## 2013-03-18 DIAGNOSIS — Z96659 Presence of unspecified artificial knee joint: Secondary | ICD-10-CM

## 2013-03-18 DIAGNOSIS — E871 Hypo-osmolality and hyponatremia: Secondary | ICD-10-CM

## 2013-03-18 DIAGNOSIS — M1710 Unilateral primary osteoarthritis, unspecified knee: Secondary | ICD-10-CM

## 2013-03-18 DIAGNOSIS — D62 Acute posthemorrhagic anemia: Secondary | ICD-10-CM

## 2013-03-18 LAB — CBC
HCT: 29.7 % — ABNORMAL LOW (ref 36.0–46.0)
Hemoglobin: 9.7 g/dL — ABNORMAL LOW (ref 12.0–15.0)
MCH: 29.4 pg (ref 26.0–34.0)
MCHC: 32.7 g/dL (ref 30.0–36.0)
MCV: 90 fL (ref 78.0–100.0)
Platelets: 183 10*3/uL (ref 150–400)
RBC: 3.3 MIL/uL — ABNORMAL LOW (ref 3.87–5.11)
RDW: 13.7 % (ref 11.5–15.5)
WBC: 7.2 10*3/uL (ref 4.0–10.5)

## 2013-03-18 LAB — BASIC METABOLIC PANEL
BUN: 11 mg/dL (ref 6–23)
CO2: 26 mEq/L (ref 19–32)
Calcium: 8.1 mg/dL — ABNORMAL LOW (ref 8.4–10.5)
Chloride: 101 mEq/L (ref 96–112)
Creatinine, Ser: 0.74 mg/dL (ref 0.50–1.10)
GFR calc Af Amer: >90 mL/min (ref >90)
GFR calc non Af Amer: >90 mL/min (ref >90)
Glucose, Bld: 120 mg/dL — ABNORMAL HIGH (ref 70–99)
Potassium: 3.7 mEq/L (ref 3.5–5.1)
Sodium: 134 mEq/L — ABNORMAL LOW (ref 135–145)

## 2013-03-18 LAB — PROTIME-INR
INR: 1.07 (ref 0.00–1.49)
Prothrombin Time: 13.7 seconds (ref 11.6–15.2)

## 2013-03-18 MED ORDER — POLYSACCHARIDE IRON COMPLEX 150 MG PO CAPS
150.0000 mg | ORAL_CAPSULE | Freq: Every day | ORAL | Status: DC
  Administered 2013-03-18 – 2013-03-22 (×5): 150 mg via ORAL
  Filled 2013-03-18 (×5): qty 1

## 2013-03-18 MED ORDER — WARFARIN - PHARMACIST DOSING INPATIENT: Freq: Every day | Status: DC

## 2013-03-18 MED ORDER — WARFARIN SODIUM 2.5 MG PO TABS
2.5000 mg | ORAL_TABLET | Freq: Once | ORAL | Status: AC
  Administered 2013-03-18: 2.5 mg via ORAL
  Filled 2013-03-18: qty 1

## 2013-03-18 MED ORDER — WARFARIN VIDEO: Freq: Once | Status: DC

## 2013-03-18 MED ORDER — COUMADIN BOOK
Freq: Once | Status: AC
  Administered 2013-03-18: 18:00:00
  Filled 2013-03-18: qty 1

## 2013-03-18 NOTE — Evaluation (Signed)
Physical Therapy Evaluation Patient Details Name: Jennifer Sanford MRN: [id number] DOB: [birth year] Today's Date: 03/18/2013 Time: 4098-1191 PT Time Calculation (min): 39 min  PT Assessment / Plan / Recommendation History of Present Illness  Bil TKR  Clinical Impression  Pt s/p Bil TKR presents with decreased Bil LE strength/ROM, post op pain and dizziness limiting functional mobility.  Pt will benefit from follow care at CIR level prior to d/c home    PT Assessment  Patient needs continued PT services    Follow Up Recommendations  CIR    Does the patient have the potential to tolerate intense rehabilitation      Barriers to Discharge        Equipment Recommendations  None recommended by PT    Recommendations for Other Services OT consult   Frequency 7X/week    Precautions / Restrictions Precautions Precautions: Knee;Fall Required Braces or Orthoses: Knee Immobilizer - Right;Knee Immobilizer - Left Knee Immobilizer - Right: Discontinue once straight leg raise with < 10 degree lag Knee Immobilizer - Left: Discontinue once straight leg raise with < 10 degree lag Restrictions Weight Bearing Restrictions: No Other Position/Activity Restrictions: WBAT   Pertinent Vitals/Pain 6/10; premed, epidural in place      Mobility  Bed Mobility Bed Mobility: Supine to Sit;Sit to Supine Supine to Sit: 1: +2 Total assist Supine to Sit: Patient Percentage: 20% Sit to Supine: 1: +2 Total assist Sit to Supine: Patient Percentage: 0% Details for Bed Mobility Assistance: Pt returned to bed total assist with c/o dizziness and "I think I'm going to pass out" Transfers Transfers: Not assessed Details for Transfer Assistance: pt dizzy in sitting - not attempted Ambulation/Gait Ambulation/Gait Assistance: Not tested (comment)    Exercises Total Joint Exercises Ankle Circles/Pumps: AROM;10 reps;Supine;Both Quad Sets: AROM;AAROM;Both;10 reps;Supine Heel Slides: AAROM;10  reps;Supine Straight Leg Raises: AAROM;Both;10 reps;Supine   PT Diagnosis: Difficulty walking  PT Problem List: Decreased strength;Decreased range of motion;Decreased activity tolerance;Decreased mobility;Decreased knowledge of use of DME;Obesity;Pain PT Treatment Interventions: DME instruction;Gait training;Functional mobility training;Therapeutic activities;Therapeutic exercise;Patient/family education     PT Goals(Current goals can be found in the care plan section) Acute Rehab PT Goals Patient Stated Goal: Rehab and home PT Goal Formulation: With patient Potential to Achieve Goals: Good  Visit Information  Last PT Received On: 03/18/13 Assistance Needed: +2 History of Present Illness: Bil TKR       Prior Functioning  Home Living Family/patient expects to be discharged to:: Inpatient rehab Prior Function Level of Independence: Independent Communication Communication: No difficulties Dominant Hand: Right    Cognition  Cognition Arousal/Alertness: Awake/alert Behavior During Therapy: WFL for tasks assessed/performed Overall Cognitive Status: Within Functional Limits for tasks assessed    Extremity/Trunk Assessment Upper Extremity Assessment Upper Extremity Assessment: Overall WFL for tasks assessed Lower Extremity Assessment Lower Extremity Assessment: RLE deficits/detail;LLE deficits/detail RLE Deficits / Details: Quads 2/5 with AAROM -12 - 25 with ++ muscle guarding LLE Deficits / Details: 2-/5 quads with AAROM at knee -1 0 - 30 with ++ muscle guarding   Balance    End of Session PT - End of Session Equipment Utilized During Treatment: Right knee immobilizer;Left knee immobilizer Activity Tolerance: Patient limited by pain;Other (comment) (dizziness with move to sitting) Patient left: in bed;with call bell/phone within reach;with family/visitor present Nurse Communication: Mobility status;Other (comment) (dizziness and BP)  GP     Laurann Mcmorris 03/18/2013,  12:52 PM

## 2013-03-18 NOTE — Progress Notes (Signed)
Clinical Social Work Department BRIEF PSYCHOSOCIAL ASSESSMENT 03/18/2013  Patient:  Jennifer Sanford, Jennifer Sanford     Account Number:  [id number]     Admit date:  03/17/2013  Clinical Social Worker:  Candie Chroman  Date/Time:  03/18/2013 01:03 PM  Referred by:  Physician  Date Referred:  03/18/2013 Referred for  SNF Placement   Other Referral:   Interview type:  Patient Other interview type:    PSYCHOSOCIAL DATA Living Status:  ALONE Admitted from facility:   Level of care:   Primary support name:  [name] Primary support relationship to patient:  CHILD, ADULT Degree of support available:   supportive    CURRENT CONCERNS Current Concerns  Post-Acute Placement   Other Concerns:    SOCIAL WORK ASSESSMENT / PLAN Pt is a 62 yr old female living at home prior to hospitalization. CSW met with pt / daughter to assist with d/c planning. Pt hopes to have ST Rehab at [facility] following hospital d/c. Pt will accept SNF placement if CIR is unable to assist. CSW has initiated SNF search and will provide bed offers as received.   Assessment/plan status:  Psychosocial Support/Ongoing Assessment of Needs Other assessment/ plan:   Information/referral to community resources:   SNF list with bed offers to be provided.    PATIENT'S/FAMILY'S RESPONSE TO PLAN OF CARE: Pt would like to have her rehab at CIR . CSW will assist with SNF placement if required. Pt is in agreement with this plan.   Cori Razor LCSW [phone]

## 2013-03-18 NOTE — Progress Notes (Signed)
ANTICOAGULATION CONSULT NOTE - Initial Consult  Pharmacy Consult for warfarin Indication: s/p bilateral TKA  No Known Allergies  Patient Measurements: Height: 5\' 11"  (180.3 cm) Weight: 224 lb (101.606 kg) IBW/kg (Calculated) : 70.8  Vital Signs: Temp: 99.2 F (37.3 C) (06/26 1029) Temp src: Oral (06/26 1029) BP: 142/94 mmHg (06/26 1029) Pulse Rate: 75 (06/26 1029)  Labs:  Recent Labs  03/18/13 0507  HGB 9.7*  HCT 29.7*  PLT 183  LABPROT 13.7  INR 1.07  CREATININE 0.74    Estimated Creatinine Clearance: 99.3 ml/min (by C-G formula based on Cr of 0.74).   Medical History: Past Medical History  Diagnosis Date  . Hypertension   . Hypercholesteremia   . Arthritis   . Depression   . GERD (gastroesophageal reflux disease)     Medications:  Scheduled:  . atorvastatin  40 mg Oral q1800  . citalopram  10 mg Oral q morning - 10a  . docusate sodium  100 mg Oral BID  . iron polysaccharides  150 mg Oral Daily  . pantoprazole  40 mg Oral Daily  . polyethylene glycol  17 g Oral Daily  . triamterene-hydrochlorothiazide  0.5 tablet Oral q morning - 10a   Infusions:  . sodium chloride 50 mL/hr at 03/18/13 0830  . bupivacaine (MARCAINE, SENSORCAINE) epidural 10 mL/hr at 03/17/13 1800    Assessment: 62 yo s/p Bilateral TKA on 6/25 currently with epidural in for pain control. Epidural likely to be taken out tomorrow with plan to start Lovenox for DVT ppx at appropriate time after pulling epidural inn combo with warfarin until INR therapeutic. Warfarin to start tonight  Goal of Therapy:  INR 2-3   Plan:  1) Per protocol, to start lower dose of warfarin due to epidural pain management currently - 2.5mg  tonight 2) Daily INR 3) Follow up epidural removal and start of Lovenox after that removal 4) Warfarin education materials ordered and video to watch   Hessie Knows, PharmD, BCPS Pager [phone] 03/18/2013 12:01 PM

## 2013-03-18 NOTE — Progress Notes (Signed)
Subjective: 1 Day Post-Op Procedure(s) (LRB): TOTAL KNEE BILATERAL (Bilateral) Patient reports pain as moderate.   Patient seen in rounds by Dr. Lequita Halt.  Patient is hurting this morning.  Will get Anesthesia to check epidural.  Plan is to look into CIR.  Order placed. Patient is having problems with pain in the knees, requiring pain medications We will start therapy today.  Plan is to go Rehab after hospital stay.  Objective: Vital signs in last 24 hours: Temp:  [97.4 F (36.3 C)-99.1 F (37.3 C)] 99.1 F (37.3 C) (06/26 1610) Pulse Rate:  [55-91] 83 (06/26 0633) Resp:  [11-21] 14 (06/26 0736) BP: (75-159)/(45-117) 119/83 mmHg (06/26 0633) SpO2:  [94 %-100 %] 100 % (06/26 0633) FiO2 (%):  [98 %] 98 % (06/25 1530) Weight:  [101.606 kg (224 lb)] 101.606 kg (224 lb) (06/25 1530)  Intake/Output from previous day:  Intake/Output Summary (Last 24 hours) at 03/18/13 0825 Last data filed at 03/18/13 0700  Gross per 24 hour  Intake 4212.5 ml  Output   2050 ml  Net 2162.5 ml    Intake/Output this shift: UOP 625 since MN +2162  Labs:  Recent Labs  03/18/13 0507  HGB 9.7*    Recent Labs  03/18/13 0507  WBC 7.2  RBC 3.30*  HCT 29.7*  PLT 183    Recent Labs  03/18/13 0507  NA 134*  K 3.7  CL 101  CO2 26  BUN 11  CREATININE 0.74  GLUCOSE 120*  CALCIUM 8.1*    Recent Labs  03/18/13 0507  INR 1.07    EXAM General - Patient is Alert, Appropriate and Oriented Extremity - Neurovascular intact Sensation intact distally Dorsiflexion/Plantar flexion intact Dressing - dressing C/D/I to both knees Motor Function - intact, moving feet and toes well on exam.  Both Hemovacs pulled without difficulty.  Past Medical History  Diagnosis Date  . Hypertension   . Hypercholesteremia   . Arthritis   . Depression   . GERD (gastroesophageal reflux disease)     Assessment/Plan: 1 Day Post-Op Procedure(s) (LRB): TOTAL KNEE BILATERAL (Bilateral) Principal  Problem:   OA (osteoarthritis) of knee Active Problems:   Postoperative anemia due to acute blood loss   Hyponatremia  Estimated body mass index is 31.26 kg/(m^2) as calculated from the following:   Height as of this encounter: 5\' 11"  (1.803 m).   Weight as of this encounter: 101.606 kg (224 lb). Advance diet Up with therapy Discharge to CIR if approved  Continue foley for now.  Will keep foley until tomorrow and will not be removed until at least 6-8 hours following the removal of the epidural catheter.  DVT Prophylaxis - Lovenox and Coumadin, Lovenox will not start until tomorrow afternoon following removal of the epidural. First dose of Coumadin this evening. Weight-Bearing as tolerated to both leg  No vaccines.  Continue O2 and Pulse OX   Take Coumadin for four weeks and then discontinue.  The dose may need to be adjusted based upon the INR.  Please follow the INR and titrate Coumadin dose for a therapeutic range between 2.0 and 3.0 INR.  After completing the four weeks of Coumadin, the patient may stop the Coumadin and resume their 81 mg Aspirin daily.  Lovenox injections will start tomorrow evening after the epidural has been removed and continue until the INR is therapeutic at or greater than 2.0.  When INR reaches the therapeutic level of equal to or greater than 2.0, the patient may discontinue the  Lovenox injections.  PERKINS, ALEXZANDREW 03/18/2013, 8:25 AM

## 2013-03-18 NOTE — Progress Notes (Signed)
Rehab Admissions Coordinator Note:  Patient was screened by Clois Dupes for appropriateness for an Inpatient Acute Rehab Consult. Rehab consult pending today. Patient and I have been pursuing inpt rehab admission preop.I will follow. Await OT eval.  Clois Dupes 03/18/2013, 1:07 PM  I can be reached at [phone].

## 2013-03-18 NOTE — Progress Notes (Signed)
Patient afebrile, vital signs stable.  Adequate analgesia. Site clean, dry, and intact. Patients questions answered.  Plan continue current infusion Will discontinue per surgeons request  B Temp level to T12

## 2013-03-18 NOTE — Progress Notes (Signed)
Met with Mrs Handa at bedside on behalf of [employer] [place] to [employer] program as being a [employer] employee with [employer]. Will continue to follow. Left contact information as well.   Raiford Noble, MSN-Ed, RN,BSN-- [facility] [phone]

## 2013-03-18 NOTE — Progress Notes (Signed)
Physical Therapy Treatment Patient Details Name: ALVINA STROTHER MRN: [id number] DOB: 1950-11-07 Today's Date: 03/18/2013 Time: 1478-2956 PT Time Calculation (min): 23 min  PT Assessment / Plan / Recommendation  PT Comments   Pt to standing with assist of 2 and stood x 5 min for hygiene and changing of bed linens.  Pt with difficulty initiating step with  LE and able to amb 2' with assist.  Increased required for all tasks  Follow Up Recommendations  CIR     Does the patient have the potential to tolerate intense rehabilitation     Barriers to Discharge        Equipment Recommendations  None recommended by PT    Recommendations for Other Services OT consult  Frequency 7X/week   Progress towards PT Goals Progress towards PT goals: Progressing toward goals  Plan Current plan remains appropriate    Precautions / Restrictions Precautions Precautions: Knee;Fall Required Braces or Orthoses: Knee Immobilizer - Right;Knee Immobilizer - Left Knee Immobilizer - Right: Discontinue once straight leg raise with < 10 degree lag Knee Immobilizer - Left: Discontinue once straight leg raise with < 10 degree lag Restrictions Weight Bearing Restrictions: No Other Position/Activity Restrictions: WBAT   Pertinent Vitals/Pain 5/10; premed    Mobility  Bed Mobility Bed Mobility: Supine to Sit;Sit to Supine Supine to Sit: 1: +2 Total assist Supine to Sit: Patient Percentage: 40% Sit to Supine: 1: +2 Total assist Sit to Supine: Patient Percentage: 20% Details for Bed Mobility Assistance: Pt returned to bed c/o dizziness Transfers Transfers: Sit to Stand;Stand to Sit Sit to Stand: 1: +2 Total assist;From bed;From elevated surface Sit to Stand: Patient Percentage: 40% Stand to Sit: 1: +2 Total assist;To bed;To elevated surface Stand to Sit: Patient Percentage: 40% Details for Transfer Assistance: cues and assist for use of UEs and management of LEs Ambulation/Gait Ambulation/Gait  Assistance: 1: +2 Total assist Ambulation/Gait: Patient Percentage: 60% Ambulation Distance (Feet): 2 Feet Assistive device: Rolling walker Ambulation/Gait Assistance Details: Cues for sequence, posture and increased UE WB.  Pt with difficulty advancing L LE and requiring assist  Gait Pattern: Step-to pattern;Trunk flexed    Exercises     PT Diagnosis:    PT Problem List:   PT Treatment Interventions:     PT Goals (current goals can now be found in the care plan section) Acute Rehab PT Goals Patient Stated Goal: Rehab and home  Visit Information  Last PT Received On: 03/18/13 Assistance Needed: +2 History of Present Illness: Bil TKR    Subjective Data  Subjective: I'm feeling better than this morning Patient Stated Goal: Rehab and home   Cognition  Cognition Arousal/Alertness: Awake/alert Behavior During Therapy: WFL for tasks assessed/performed Overall Cognitive Status: Within Functional Limits for tasks assessed    Balance     End of Session PT - End of Session Equipment Utilized During Treatment: Right knee immobilizer;Left knee immobilizer Activity Tolerance: Patient limited by fatigue;Patient limited by pain Patient left: in bed;with call bell/phone within reach;with family/visitor present Nurse Communication: Mobility status CPM Left Knee CPM Left Knee: On   GP     Loriene Taunton 03/18/2013, 4:58 PM

## 2013-03-18 NOTE — Progress Notes (Signed)
Clinical Social Work Department CLINICAL SOCIAL WORK PLACEMENT NOTE 03/18/2013  Patient:  Jennifer Sanford, Jennifer Sanford  Account Number:  [id number] Admit date:  03/17/2013  Clinical Social Worker:  Cori Razor, LCSW  Date/time:  03/18/2013 01:13 PM  Clinical Social Work is seeking post-discharge placement for this patient at the following level of care:   SKILLED NURSING   (*CSW will update this form in Epic as items are completed)     Patient/family provided with [facility] Department of Clinical Social Work's list of facilities offering this level of care within the geographic area requested by the patient (or if unable, by the patient's family).  03/18/2013  Patient/family informed of their freedom to choose among providers that offer the needed level of care, that participate in Medicare, Medicaid or managed care program needed by the patient, have an available bed and are willing to accept the patient.    Patient/family informed of [facility]' ownership interest in [facility], as well as of the fact that they are under no obligation to receive care at this facility.  PASARR submitted to EDS on 03/18/2013 PASARR number received from EDS on   FL2 transmitted to all facilities in geographic area requested by pt/family on  03/18/2013 FL2 transmitted to all facilities within larger geographic area on   Patient informed that his/her managed care company has contracts with or will negotiate with  certain facilities, including the following:     Patient/family informed of bed offers received:   Patient chooses bed at  Physician recommends and patient chooses bed at    Patient to be transferred to  on   Patient to be transferred to facility by   The following physician request were entered in Epic:   Additional Comments: [employer] requires prior authorization for SNF placement.

## 2013-03-19 LAB — BASIC METABOLIC PANEL
BUN: 11 mg/dL (ref 6–23)
CO2: 28 mEq/L (ref 19–32)
Calcium: 9 mg/dL (ref 8.4–10.5)
Chloride: 99 mEq/L (ref 96–112)
Creatinine, Ser: 0.95 mg/dL (ref 0.50–1.10)
GFR calc Af Amer: 75 mL/min — ABNORMAL LOW (ref >90)
GFR calc non Af Amer: 64 mL/min — ABNORMAL LOW (ref >90)
Glucose, Bld: 143 mg/dL — ABNORMAL HIGH (ref 70–99)
Potassium: 4.3 mEq/L (ref 3.5–5.1)
Sodium: 133 mEq/L — ABNORMAL LOW (ref 135–145)

## 2013-03-19 LAB — CBC
HCT: 28.3 % — ABNORMAL LOW (ref 36.0–46.0)
Hemoglobin: 9.2 g/dL — ABNORMAL LOW (ref 12.0–15.0)
MCH: 29.6 pg (ref 26.0–34.0)
MCHC: 32.5 g/dL (ref 30.0–36.0)
MCV: 91 fL (ref 78.0–100.0)
Platelets: 188 10*3/uL (ref 150–400)
RBC: 3.11 MIL/uL — ABNORMAL LOW (ref 3.87–5.11)
RDW: 13.9 % (ref 11.5–15.5)
WBC: 14.1 10*3/uL — ABNORMAL HIGH (ref 4.0–10.5)

## 2013-03-19 LAB — PROTIME-INR
INR: 1.27 (ref 0.00–1.49)
Prothrombin Time: 15.6 seconds — ABNORMAL HIGH (ref 11.6–15.2)

## 2013-03-19 MED ORDER — ENOXAPARIN SODIUM 30 MG/0.3ML ~~LOC~~ SOLN
30.0000 mg | Freq: Two times a day (BID) | SUBCUTANEOUS | Status: DC
  Administered 2013-03-19 – 2013-03-22 (×6): 30 mg via SUBCUTANEOUS
  Filled 2013-03-19 (×7): qty 0.3

## 2013-03-19 MED ORDER — WARFARIN SODIUM 5 MG PO TABS
5.0000 mg | ORAL_TABLET | Freq: Once | ORAL | Status: AC
  Administered 2013-03-19: 5 mg via ORAL
  Filled 2013-03-19: qty 1

## 2013-03-19 NOTE — Progress Notes (Signed)
Physical Therapy Treatment Patient Details Name: Jennifer Sanford MRN: [id number] DOB: [birth year] Today's Date: 03/19/2013 Time: 7829-5621 PT Time Calculation (min): 32 min  PT Assessment / Plan / Recommendation  PT Comments   OOB deferred to after epidural pulled  Follow Up Recommendations  CIR     Does the patient have the potential to tolerate intense rehabilitation     Barriers to Discharge        Equipment Recommendations  None recommended by PT    Recommendations for Other Services OT consult  Frequency 7X/week   Progress towards PT Goals Progress towards PT goals: Progressing toward goals  Plan Current plan remains appropriate    Precautions / Restrictions Precautions Precautions: Knee;Fall Required Braces or Orthoses: Knee Immobilizer - Right;Knee Immobilizer - Left Knee Immobilizer - Right: Discontinue once straight leg raise with < 10 degree lag Knee Immobilizer - Left: Discontinue once straight leg raise with < 10 degree lag Restrictions Weight Bearing Restrictions: No Other Position/Activity Restrictions: WBAT   Pertinent Vitals/Pain 10/10; premed, ice packs    Mobility  Bed Mobility Bed Mobility: Supine to Sit Supine to Sit: 1: +2 Total assist Supine to Sit: Patient Percentage: 50% Transfers Sit to Stand: 1: +2 Total assist;From bed Sit to Stand: Patient Percentage: 40% Stand to Sit: 1: +2 Total assist;To chair/3-in-1 Stand to Sit: Patient Percentage: 40%    Exercises Total Joint Exercises Ankle Circles/Pumps: AROM;Supine;Both;20 reps Quad Sets: AROM;AAROM;Both;Supine;20 reps Heel Slides: AAROM;Supine;20 reps Straight Leg Raises: AAROM;Both;Supine;20 reps   PT Diagnosis:    PT Problem List:   PT Treatment Interventions:     PT Goals (current goals can now be found in the care plan section) Acute Rehab PT Goals Patient Stated Goal: rehab then home  Visit Information  Last PT Received On: 03/19/13 Assistance Needed: +2    Subjective Data  Patient Stated Goal: rehab then home   Cognition  Cognition Arousal/Alertness: Awake/alert Behavior During Therapy: [facility] for tasks assessed/performed Overall Cognitive Status: Within Functional Limits for tasks assessed    Balance     End of Session PT - End of Session Equipment Utilized During Treatment: Right knee immobilizer;Left knee immobilizer Activity Tolerance: Patient tolerated treatment well Patient left: in bed;with call bell/phone within reach;with family/visitor present CPM Left Knee CPM Left Knee: Off   GP     Jennifer Sanford 03/19/2013, 12:53 PM

## 2013-03-19 NOTE — PMR Pre-admission (Signed)
PMR Admission Coordinator Pre-Admission Assessment  Patient: Jennifer Sanford is an 62 y.o., female MRN: [id number] DOB: May 18, 1951 Height: 5\' 11"  (180.3 cm) Weight: 101.606 kg (224 lb)              Insurance Information HMO:     PPO: yes     PCP:      IPA:      80/20:      OTHER:  PRIMARYPhineas Real      Policy#: [id number]      Subscriber: pt CM Name: Jae Dire      Phone#: [phone]     Fax#: [phone] Pre-Cert#: [id number]      Employer: [employer] update in 7 days Benefits:  Phone #: [phone]     Name: 6/26 Eff. Date: 09/23/12     Deduct: $750 [facility]/ $1000 non [facility]      Out of Pocket Max: $4000 [facility]/ $6350 non [facility]      Life Max: unlimited CIR: 80%      SNF: 80% 120 days Outpatient: [facility] $20/visit   No visit limits  Co-Pay: non [facility] $40 as auth Home Health: 80%       Co-Pay: with $1000 ded and $6350 oop max DME: 80%     Co-Pay: must prescert > $1500 and $1000 ded and $6350 oop max Providers: in network and out of network benefits  SECONDARY: none       Medicaid Application Date:       Case Manager:  Disability Application Date:       Case Worker: short term disability with [facility]  Emergency Contact Information Contact Information   Name Relation Home Work Mobile   [place] Daughter [phone]       Current Medical History  Patient Admitting Diagnosis: End-stage osteoarthritis of both knees status post bilateral total knee replacement   History of Present Illness: 62 y.o. right-handed female admitted 03/16/2013 with end stage changes bilateral knees and no relief with conservative care. 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 Bilateral total knee arthroplasty 03/17/2013 per Dr.Alusio. Placed on Coumadin for DVT prophylaxis advised weightbearing as tolerated.    Patient did require transfusion with HGB 7.2 on day 4. Hgb now 9.4.  Past Medical History  Past Medical History  Diagnosis Date  . Hypertension   .  Hypercholesteremia   . Arthritis   . Depression   . GERD (gastroesophageal reflux disease)     Family History  family history includes Cancer in her father and other; Diabetes in her mother; and Hypertension in her father.  Prior Rehab/Hospitalizations: none   Current Medications  Current facility-administered medications:atorvastatin (LIPITOR) tablet 40 mg, 40 mg, Oral, q1800, Loanne Drilling, MD, 40 mg at 03/21/13 1831;  bisacodyl (DULCOLAX) suppository 10 mg, 10 mg, Rectal, Daily PRN, Loanne Drilling, MD;  citalopram (CELEXA) tablet 10 mg, 10 mg, Oral, q morning - 10a, Loanne Drilling, MD, 10 mg at 03/21/13 3329 diphenhydrAMINE (BENADRYL) 12.5 MG/5ML elixir 12.5-25 mg, 12.5-25 mg, Oral, Q4H PRN, Loanne Drilling, MD, 12.5 mg at 03/20/13 1118;  docusate sodium (COLACE) capsule 100 mg, 100 mg, Oral, BID, Loanne Drilling, MD, 100 mg at 03/21/13 2032;  enoxaparin (LOVENOX) injection 30 mg, 30 mg, Subcutaneous, Q12H, Alexzandrew Perkins, PA-C, 30 mg at 03/21/13 2142 HYDROcodone-acetaminophen (NORCO/VICODIN) 5-325 MG per tablet 1-2 tablet, 1-2 tablet, Oral, Q4H PRN, Ralene Bathe, PA-C, 2 tablet at 03/22/13 [phone];  iron polysaccharides (NIFEREX) capsule 150 mg,  150 mg, Oral, Daily, Alexzandrew Perkins, PA-C, 150 mg at 03/21/13 0865;  menthol-cetylpyridinium (CEPACOL) lozenge 3 mg, 1 lozenge, Oral, PRN, Loanne Drilling, MD methocarbamol (ROBAXIN) 500 mg in dextrose 5 % 50 mL IVPB, 500 mg, Intravenous, Q6H PRN, Loanne Drilling, MD, 500 mg at 03/17/13 2025;  methocarbamol (ROBAXIN) tablet 500 mg, 500 mg, Oral, Q6H PRN, Loanne Drilling, MD, 500 mg at 03/22/13 0753;  metoCLOPramide (REGLAN) injection 5-10 mg, 5-10 mg, Intravenous, Q8H PRN, Loanne Drilling, MD;  metoCLOPramide (REGLAN) tablet 5-10 mg, 5-10 mg, Oral, Q8H PRN, Loanne Drilling, MD morphine 2 MG/ML injection 1-2 mg, 1-2 mg, Intravenous, Q1H PRN, Loanne Drilling, MD;  ondansetron [facility]) injection 4 mg, 4 mg, Intravenous, Q6H PRN, Loanne Drilling,  MD, 4 mg at 03/17/13 1939;  ondansetron (ZOFRAN) tablet 4 mg, 4 mg, Oral, Q6H PRN, Loanne Drilling, MD;  pantoprazole (PROTONIX) EC tablet 40 mg, 40 mg, Oral, Daily, Loanne Drilling, MD, 40 mg at 03/21/13 0958 phenol (CHLORASEPTIC) mouth spray 1 spray, 1 spray, Mouth/Throat, PRN, Loanne Drilling, MD;  polyethylene glycol (MIRALAX / GLYCOLAX) packet 17 g, 17 g, Oral, Daily, Loanne Drilling, MD, 17 g at 03/21/13 7846;  traMADol (ULTRAM) tablet 50-100 mg, 50-100 mg, Oral, Q6H PRN, Loanne Drilling, MD, 100 mg at 03/19/13 1428 triamterene-hydrochlorothiazide (MAXZIDE-25) 37.5-25 MG per tablet 0.5 tablet, 0.5 tablet, Oral, q morning - 10a, Loanne Drilling, MD, 0.5 tablet at 03/21/13 9629;  warfarin (COUMADIN) video, , Does not apply, Once, Berkley Harvey, [facility];  Warfarin - Pharmacist Dosing Inpatient, , Does not apply, q1800, Berkley Harvey, [facility]  Patients Current Diet: General  Precautions / Restrictions Precautions Precautions: Knee;Fall Restrictions Weight Bearing Restrictions: No Other Position/Activity Restrictions: WBAT   Prior Activity Level Community (5-7x/wk): 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 [employer] / Equipment Home Assistive Devices/Equipment: Eyeglasses  Prior Functional Level Prior Function Level of Independence: Independent  Current Functional Level Cognition  Overall Cognitive Status: Within Functional Limits for tasks assessed Orientation Level: Oriented X4    Extremity Assessment (includes Sensation/Coordination)  Upper Extremity Assessment: Overall WFL for tasks assessed  Lower Extremity Assessment: RLE deficits/detail;LLE deficits/detail RLE Deficits / Details: Quads 2/5 with AAROM -12 - 25 with ++ muscle guarding LLE Deficits / Details: 2-/5 quads with AAROM at knee -1 0 - 30 with ++ muscle guarding    ADLs  Upper Body Bathing: Performed;Minimal assistance Where Assessed - Upper Body Bathing: Supported sitting Lower Body  Bathing: Performed;+2 Total assistance Lower Body Bathing: Patient Percentage: 20% Where Assessed - Lower Body Bathing: Supported sit to stand Upper Body Dressing: Performed;Minimal assistance Where Assessed - Upper Body Dressing: Supported sitting Lower Body Dressing: Performed;+2 Total assistance Lower Body Dressing: Patient Percentage: 20% Where Assessed - Lower Body Dressing: Supported sit to Pharmacist, hospital: Performed;+2 Total assistance Toilet Transfer: Patient Percentage: 40% Statistician Method: Sit to Barista: Extra wide Materials engineer and Hygiene: +2 Total assistance Toileting - Clothing Manipulation and Hygiene: Patient Percentage: 50% Where Assessed - Glass blower/designer Manipulation and Hygiene: Standing    Mobility  Bed Mobility: Sit to Supine Supine to Sit: 1: +2 Total assist Supine to Sit: Patient Percentage: 60% Sit to Supine: 1: +2 Total assist Sit to Supine: Patient Percentage: 30%    Transfers  Transfers: Sit to Stand;Stand to Sit Sit to Stand: 1: +2 Total assist;From chair/3-in-1;With upper extremity assist Sit to Stand:  Patient Percentage: 60% Stand to Sit: 1: +2 Total assist;To chair/3-in-1;With upper extremity assist;With armrests Stand to Sit: Patient Percentage: 60%    Ambulation / Gait / Stairs / Wheelchair Mobility  Ambulation/Gait Ambulation/Gait Assistance: 1: +2 Total assist Ambulation/Gait: Patient Percentage: 70% Ambulation Distance (Feet): 24 Feet Assistive device: Rolling walker Ambulation/Gait Assistance Details: cues for posture, position from RW, sequence, and UE WB Gait Pattern: Step-to pattern;Trunk flexed;Decreased stride length Stairs: No    Posture / Balance      Special needs/care consideration CPM yes Bowel mgmt:continent/did take Mg citrate pta preop prevention of issues Bladder mgmt:continent    Previous Home Environment Living Arrangements: Alone  Lives  With: Alone Available Help at Discharge: Family Type of Home: House Home Layout: Two level;Able to live on main level with bedroom/bathroom Home Access: Stairs to enter Entrance Stairs-Rails: None Entrance Stairs-Number of Steps: one step Bathroom Shower/Tub: Health visitor: Standard Bathroom Accessibility: Yes How Accessible: Accessible via walker Home Care Services: No  Discharge Living Setting Plans for Discharge Living Setting: Patient's home;Lives with (comment) (3 dtrs to provide 24/7 assist) Type of Home at Discharge: House Discharge Home Layout: Two level;Able to live on main level with bedroom/bathroom Discharge Home Access: Stairs to enter Entrance Stairs-Number of Steps: one steps Discharge Bathroom Shower/Tub: Walk-in shower Discharge Bathroom Toilet: Standard Discharge Bathroom Accessibility: Yes How Accessible: Accessible via walker Do you have any problems obtaining your medications?: No  Social/Family/Support Systems Patient Roles: Parent;Other (Comment) (employee; active in [place]) Solicitor Information: Assunta Curtis, dtr from [place] Anticipated Caregiver: 3 daughters; all live in [place] bu have arranged 24/7 care Anticipated Caregiver's Contact Information: Rene Kocher [phone] Ability/Limitations of Caregiver: no limitations Caregiver Availability: 24/7 Discharge Plan Discussed with Primary Caregiver: Yes Is Caregiver In Agreement with Plan?: Yes Does Caregiver/Family have Issues with Lodging/Transportation while Pt is in Rehab?: No    Goals/Additional Needs Patient/Family Goal for Rehab: Mod I PT and OT Expected length of stay: ELOS 7 to 10 days Pt/Family Agrees to Admission and willing to participate: Yes Program Orientation Provided & Reviewed with Pt/Caregiver Including Roles  & Responsibilities: Yes   Decrease burden of Care through IP rehab admission: n/a  Possible need for SNF placement upon discharge:n/a  Patient  Condition: This patient's medical and functional status has changed since the consult dated: 03/18/13 in which the Rehabilitation Physician determined and documented that the patient's condition is appropriate for intensive rehabilitative care in an inpatient rehabilitation facility. See "History of Present Illness" (above) for medical update. Functional changes are: mod assist to transfer and them min to mod assist short distances. Patient's medical and functional status update has been discussed with the Rehabilitation physician and patient remains appropriate for inpatient rehabilitation. Will admit to inpatient rehab today.  Preadmission Screen Completed By:  Clois Dupes, 03/22/2013 8:49 AM ______________________________________________________________________   Discussed status with Dr. Riley Kill on 03/22/13 at 0848 and received telephone approval for admission today.  Admission Coordinator:  Clois Dupes, time 0981 Date 03/22/13.

## 2013-03-19 NOTE — Progress Notes (Signed)
Physical Therapy Treatment Patient Details Name: Jennifer Sanford MRN: [id number] DOB: 26-Mar-1951 Today's Date: 03/19/2013 Time: 4098-1191 PT Time Calculation (min): 32 min  PT Assessment / Plan / Recommendation PT Comments     Follow Up Recommendations  CIR     Does the patient have the potential to tolerate intense rehabilitation   yes  Barriers to Discharge        Equipment Recommendations  None recommended by PT    Recommendations for Other Services OT consult  Frequency 7X/week   Progress towards PT Goals Progress towards PT goals: Progressing toward goals  Plan Current plan remains appropriate    Precautions / Restrictions Precautions Precautions: Knee;Fall Required Braces or Orthoses: Knee Immobilizer - Right;Knee Immobilizer - Left Knee Immobilizer - Right: Discontinue once straight leg raise with < 10 degree lag Knee Immobilizer - Left: Discontinue once straight leg raise with < 10 degree lag Restrictions Weight Bearing Restrictions: No Other Position/Activity Restrictions: WBAT   Pertinent Vitals/Pain     Mobility  Bed Mobility Bed Mobility: Supine to Sit Supine to Sit: 1: +2 Total assist Supine to Sit: Patient Percentage: 50% Details for Bed Mobility Assistance: cues for sequence and use of UEs to self assist Transfers Transfers: Sit to Stand;Stand to Sit Sit to Stand: 1: +2 Total assist;From bed Sit to Stand: Patient Percentage: 40% Stand to Sit: 1: +2 Total assist;To chair/3-in-1 Stand to Sit: Patient Percentage: 40% Details for Transfer Assistance: cues and assist for use of UEs and management of LEs Ambulation/Gait Ambulation/Gait Assistance: 1: +2 Total assist Ambulation/Gait: Patient Percentage: 70% Ambulation Distance (Feet): 7 Feet Assistive device: Rolling walker Ambulation/Gait Assistance Details: cues for posture, sequence, position from RW and increased UE WB Gait Pattern: Step-to pattern;Trunk flexed;Decreased stride length    Exercises      PT Diagnosis:    PT Problem List:   PT Treatment Interventions:     PT Goals (current goals can now be found in the care plan section) Acute Rehab PT Goals Patient Stated Goal: rehab then home  Visit Information  Last PT Received On: 03/19/13 Assistance Needed: +2 PT/OT Co-Evaluation/Treatment: Yes    Subjective Data  Patient Stated Goal: rehab then home   Cognition  Cognition Arousal/Alertness: Awake/alert Behavior During Therapy: WFL for tasks assessed/performed Overall Cognitive Status: Within Functional Limits for tasks assessed    Balance     End of Session PT - End of Session Activity Tolerance: Patient tolerated treatment well Patient left: in chair;with call bell/phone within reach;with family/visitor present Nurse Communication: Mobility status CPM Left Knee CPM Left Knee: Off   GP     Wiley Flicker 03/19/2013, 1:01 PM

## 2013-03-19 NOTE — Progress Notes (Signed)
I await OT evaluation and PT progress today to begin insurance approval to hopefully admit pt to inpt rehab on Monday. [phone]

## 2013-03-19 NOTE — Care Management Note (Signed)
    Page 1 of 1   03/19/2013     1:18:39 PM   CARE MANAGEMENT NOTE 03/19/2013  Patient:  Jennifer Sanford, Jennifer Sanford   Account Number:  [id number]  Date Initiated:  03/19/2013  Documentation initiated by:  Lorenda Ishihara  Subjective/Objective Assessment:   62 yo female admitted s/p bilateral knee athroplasty. PTA lived at home.     Action/Plan:   CIR for rehab   Anticipated DC Date:  03/22/2013   Anticipated DC Plan:  IP REHAB FACILITY      DC Planning Services  CM consult      PAC Choice  IP REHAB   Choice offered to / List presented to:             Status of service:  Completed, signed off Medicare Important Message given?   (If response is "NO", the following Medicare IM given date fields will be blank) Date Medicare IM given:   Date Additional Medicare IM given:    Discharge Disposition:  HOME/SELF CARE  Per UR Regulation:  Reviewed for med. necessity/level of care/duration of stay  If discussed at Long Length of Stay Meetings, dates discussed:    Comments:

## 2013-03-19 NOTE — Progress Notes (Signed)
Subjective: 2 Days Post-Op Procedure(s) (LRB): TOTAL KNEE BILATERAL (Bilateral) Patient reports pain as mild.   Patient seen in rounds with Dr. Lequita Halt.  Family in room. Patient is well, and has had no acute complaints or problems Epidural to come out today.  Anticipate possible increase in pain temporarily after the epidural has been removed. Plan is to go Rehab after hospital stay.  Objective: Vital signs in last 24 hours: Temp:  [98.2 F (36.8 C)-99.2 F (37.3 C)] 98.2 F (36.8 C) (06/27 0529) Pulse Rate:  [75-95] 86 (06/27 0529) Resp:  [14-23] 14 (06/27 0529) BP: (107-151)/(72-98) 144/98 mmHg (06/27 0529) SpO2:  [100 %] 100 % (06/27 0529)  Intake/Output from previous day:  Intake/Output Summary (Last 24 hours) at 03/19/13 0628 Last data filed at 03/19/13 0530  Gross per 24 hour  Intake   2535 ml  Output   4925 ml  Net  -2390 ml    Intake/Output this shift: Total I/O In: 690 [P.O.:240; I.V.:450] Out: 1400 [Urine:1400]  Labs:  Recent Labs  03/18/13 0507 03/19/13 0511  HGB 9.7* 9.2*    Recent Labs  03/18/13 0507 03/19/13 0511  WBC 7.2 14.1*  RBC 3.30* 3.11*  HCT 29.7* 28.3*  PLT 183 188    Recent Labs  03/18/13 0507 03/19/13 0511  NA 134* 133*  K 3.7 4.3  CL 101 99  BUN 11 11  CREATININE 0.74 0.95  GLUCOSE 120* 143*  CALCIUM 8.1* 9.0    Recent Labs  03/18/13 0507 03/19/13 0511  INR 1.07 1.27    EXAM General - Patient is Alert, Appropriate and Oriented Extremity - Neurovascular intact Sensation intact distally Dorsiflexion/Plantar flexion intact No cellulitis present Dressing/Incision - clean, dry, no drainage, healing Motor Function - intact, moving feet and toes well on exam.    Past Medical History  Diagnosis Date  . Hypertension   . Hypercholesteremia   . Arthritis   . Depression   . GERD (gastroesophageal reflux disease)     Assessment/Plan: 2 Days Post-Op Procedure(s) (LRB): TOTAL KNEE BILATERAL  (Bilateral) Principal Problem:   OA (osteoarthritis) of knee Active Problems:   Postoperative anemia due to acute blood loss   Hyponatremia  Estimated body mass index is 31.26 kg/(m^2) as calculated from the following:   Height as of this encounter: 5\' 11"  (1.803 m).   Weight as of this encounter: 101.606 kg (224 lb). Up with therapy Continue foley due to urinary output monitoring and she still has her epidural in place.  Will remove the catheter six hours after the epidural is removed.  Will need to note in chart when the epidural is pulled.  DVT Prophylaxis - Lovenox and Coumadin, Lovenox will not start until later this afternoon though after the epidural has been removed for twelve hours.  Will need to note in chart when the epidural is pulled. Anticipate possible increase in pain temporarily after the epidural has been removed.  Weight-Bearing as tolerated to both legs  Take Coumadin for four weeks and then discontinue.  The dose may need to be adjusted based upon the INR.  Please follow the INR and titrate Coumadin dose for a therapeutic range between 2.0 and 3.0 INR.  After completing the four weeks of Coumadin, the patient may stop the Coumadin and resume their 81 mg Aspirin daily.  Lovenox injections will start tomorrow evening after the epidural has been removed and continue until the INR is therapeutic at or greater than 2.0.  When INR reaches the therapeutic  level of equal to or greater than 2.0, the patient may discontinue the Lovenox injections.  PERKINS, ALEXZANDREW 03/19/2013, 6:28 AM

## 2013-03-19 NOTE — Progress Notes (Signed)
Epidural discontinued and removed intact. No apparent anesthetic complication.

## 2013-03-19 NOTE — Progress Notes (Signed)
ANTICOAGULATION CONSULT NOTE - Follow up  Pharmacy Consult for warfarin Indication: s/p bilateral TKA  No Known Allergies  Patient Measurements: Height: 5\' 11"  (180.3 cm) Weight: 224 lb (101.606 kg) IBW/kg (Calculated) : 70.8  Vital Signs: Temp: 98.3 F (36.8 C) (06/27 1400) Temp src: Oral (06/27 1400) BP: 124/75 mmHg (06/27 1400) Pulse Rate: 79 (06/27 1400)  Labs:  Recent Labs  03/18/13 0507 03/19/13 0511  HGB 9.7* 9.2*  HCT 29.7* 28.3*  PLT 183 188  LABPROT 13.7 15.6*  INR 1.07 1.27  CREATININE 0.74 0.95   Medications:  Scheduled:  . atorvastatin  40 mg Oral q1800  . citalopram  10 mg Oral q morning - 10a  . docusate sodium  100 mg Oral BID  . iron polysaccharides  150 mg Oral Daily  . pantoprazole  40 mg Oral Daily  . polyethylene glycol  17 g Oral Daily  . triamterene-hydrochlorothiazide  0.5 tablet Oral q morning - 10a  . warfarin   Does not apply Once  . Warfarin - Pharmacist Dosing Inpatient   Does not apply q1800   Assessment: 62 yo s/p Bilateral TKA on 6/25 currently with epidural in for pain control. Epidural likely to be taken out tomorrow with plan to start Lovenox for DVT ppx at appropriate time after pulling epidural inn combo with warfarin until INR therapeutic. Warfarin to start tonight 6/26.  Epidural removed @ 10:10 (6/27)  INR = 1.27 after 2.5mg  Warfarin  Lovenox to begin tonight (12 hr after epidural pulled-time documented by Anes MD)  Goal of Therapy:  INR 2-3   Plan:   Warfarin 5mg  tonight  Daily PT/INR  Follow-up Lovenox to begin tonight per MD  Warfarin education materials provided, pt aware of video information.  Coumadin counseling begun today, patient drowsy, will need follow-up visit for education.  Otho Bellows PharmD Pager [phone] 03/19/2013, 3:08 PM

## 2013-03-19 NOTE — Evaluation (Signed)
Occupational Therapy Evaluation Patient Details Name: Jennifer Sanford MRN: [id number] DOB: Jun 27, 1951 Today's Date: 03/19/2013 Time: 1140-1210 OT Time Calculation (min): 30 min  OT Assessment / Plan / Recommendation History of present illness  Pt s.p BTKR   Clinical Impression   Pt presents to OT s/p B TKR. Pt with decreased I with ADL activity and will benefit from skilled OT to increase I and return to PLOF    OT Assessment  Patient needs continued OT Services    Follow Up Recommendations  CIR       Equipment Recommendations  Toilet riser    Recommendations for Other Services Rehab consult  Frequency  Min 3X/week    Precautions / Restrictions Precautions Precautions: Knee;Fall Required Braces or Orthoses: Knee Immobilizer - Right;Knee Immobilizer - Left Knee Immobilizer - Right: Discontinue once straight leg raise with < 10 degree lag Knee Immobilizer - Left: Discontinue once straight leg raise with < 10 degree lag Restrictions Weight Bearing Restrictions: No Other Position/Activity Restrictions: WBAT       ADL  Upper Body Bathing: Performed;Minimal assistance Where Assessed - Upper Body Bathing: Supported sitting Lower Body Bathing: Performed;+2 Total assistance Lower Body Bathing: Patient Percentage: 20% Where Assessed - Lower Body Bathing: Supported sit to stand Upper Body Dressing: Performed;Minimal assistance Where Assessed - Upper Body Dressing: Supported sitting Lower Body Dressing: Performed;+2 Total assistance Lower Body Dressing: Patient Percentage: 20% Where Assessed - Lower Body Dressing: Supported sit to Pharmacist, hospital: Simulated;+2 Total assistance Toilet Transfer: Patient Percentage: 30% Toilet Transfer Method: Sit to stand;Other (comment) (bed to chair)    OT Diagnosis: Generalized weakness;Acute pain  OT Problem List: Decreased strength;Pain;Decreased knowledge of use of DME or AE;Impaired balance (sitting and/or standing) OT Treatment  Interventions: Self-care/ADL training;Patient/family education;Therapeutic activities   OT Goals(Current goals can be found in the care plan section) Acute Rehab OT Goals Patient Stated Goal: rehab then home OT Goal Formulation: With patient Time For Goal Achievement: 04/02/13 Potential to Achieve Goals: Good  Visit Information  Assistance Needed: +2       Prior Functioning     Home Living Available Help at Discharge: Family Type of Home: House Home Access: Stairs to enter [employer] of Steps: one step Entrance Stairs-Rails: None Home Layout: Two level;Able to live on main level with bedroom/bathroom  Lives With: Alone         Vision/Perception Vision - History Patient Visual Report: No change from baseline   Cognition  Cognition Arousal/Alertness: Awake/alert Behavior During Therapy: WFL for tasks assessed/performed Overall Cognitive Status: Within Functional Limits for tasks assessed    Extremity/Trunk Assessment Upper Extremity Assessment Upper Extremity Assessment: Overall WFL for tasks assessed     Mobility Bed Mobility Bed Mobility: Supine to Sit Supine to Sit: 1: +2 Total assist Supine to Sit: Patient Percentage: 50% Transfers Transfers: Sit to Stand;Stand to Sit Sit to Stand: 1: +2 Total assist;From bed Sit to Stand: Patient Percentage: 40% Stand to Sit: 1: +2 Total assist;To chair/3-in-1 Stand to Sit: Patient Percentage: 40%           End of Session OT - End of Session Activity Tolerance: Patient tolerated treatment well CPM Left Knee CPM Left Knee: Off CPM Right Knee CPM Right Knee: Off  GO     Alba Cory 03/19/2013, 12:22 PM

## 2013-03-19 NOTE — Addendum Note (Signed)
Addendum created 03/19/13 1221 by Einar Pheasant, MD   Modules edited: Anesthesia LDA

## 2013-03-19 NOTE — Progress Notes (Addendum)
I have faxed clinicals to [facility] to acquire approval to admit pt to inpt rehab Monday if medically ready. [phone]

## 2013-03-19 NOTE — Progress Notes (Signed)
Physical Therapy Treatment Patient Details Name: KAVYA HAAG MRN: [id number] DOB: 12-20-1950 Today's Date: 03/19/2013 Time: 4098-1191 PT Time Calculation (min): 32 min  PT Assessment / Plan / Recommendation  PT Comments   Pt to/from comode and back to bed with Total assist x 2  Follow Up Recommendations  CIR     Does the patient have the potential to tolerate intense rehabilitation     Barriers to Discharge        Equipment Recommendations  None recommended by PT    Recommendations for Other Services OT consult  Frequency 7X/week   Progress towards PT Goals Progress towards PT goals: Progressing toward goals  Plan Current plan remains appropriate    Precautions / Restrictions Precautions Precautions: Knee;Fall Required Braces or Orthoses: Knee Immobilizer - Right;Knee Immobilizer - Left Knee Immobilizer - Right: Discontinue once straight leg raise with < 10 degree lag Knee Immobilizer - Left: Discontinue once straight leg raise with < 10 degree lag Restrictions Weight Bearing Restrictions: No Other Position/Activity Restrictions: WBAT   Pertinent Vitals/Pain     Mobility  Bed Mobility Bed Mobility: Sit to Supine Supine to Sit: 1: +2 Total assist Supine to Sit: Patient Percentage: 50% Sit to Supine: 1: +2 Total assist Sit to Supine: Patient Percentage: 30% Details for Bed Mobility Assistance: cues for sequence and use of UEs to self assist Transfers Transfers: Sit to Stand;Stand to Sit Sit to Stand: 1: +2 Total assist;From chair/3-in-1;With armrests Sit to Stand: Patient Percentage: 30% Stand to Sit: 1: +2 Total assist;To chair/3-in-1;To bed;With upper extremity assist;With armrests Stand to Sit: Patient Percentage: 40% Details for Transfer Assistance: cues and assist for use of UEs and management of LEs Ambulation/Gait Ambulation/Gait Assistance: 1: +2 Total assist Ambulation/Gait: Patient Percentage: 60% Ambulation Distance (Feet): 7 Feet Assistive  device: Rolling walker Ambulation/Gait Assistance Details: cues for sequence, posture and position from RW Gait Pattern: Step-to pattern;Trunk flexed;Decreased stride length    Exercises     PT Diagnosis:    PT Problem List:   PT Treatment Interventions:     PT Goals (current goals can now be found in the care plan section) Acute Rehab PT Goals Patient Stated Goal: rehab then home  Visit Information  Last PT Received On: 03/19/13 Assistance Needed: +2 PT/OT Co-Evaluation/Treatment: Yes    Subjective Data  Subjective: I need the bathroom Patient Stated Goal: rehab then home   Cognition  Cognition Arousal/Alertness: Awake/alert Behavior During Therapy: WFL for tasks assessed/performed Overall Cognitive Status: Within Functional Limits for tasks assessed    Balance     End of Session PT - End of Session Equipment Utilized During Treatment: Right knee immobilizer;Left knee immobilizer Activity Tolerance: Patient limited by fatigue;Patient limited by pain Patient left: in bed;with call bell/phone within reach;with nursing/sitter in room;with family/visitor present Nurse Communication: Mobility status CPM Left Knee CPM Left Knee: Off   GP     Anuradha Chabot 03/19/2013, 2:36 PM

## 2013-03-20 LAB — CBC
HCT: 23.8 % — ABNORMAL LOW (ref 36.0–46.0)
Hemoglobin: 7.8 g/dL — ABNORMAL LOW (ref 12.0–15.0)
MCH: 29.5 pg (ref 26.0–34.0)
MCHC: 32.8 g/dL (ref 30.0–36.0)
MCV: 90.2 fL (ref 78.0–100.0)
Platelets: 170 10*3/uL (ref 150–400)
RBC: 2.64 MIL/uL — ABNORMAL LOW (ref 3.87–5.11)
RDW: 13.9 % (ref 11.5–15.5)
WBC: 11 10*3/uL — ABNORMAL HIGH (ref 4.0–10.5)

## 2013-03-20 LAB — PROTIME-INR
INR: 1.15 (ref 0.00–1.49)
Prothrombin Time: 14.5 seconds (ref 11.6–15.2)

## 2013-03-20 MED ORDER — WARFARIN SODIUM 7.5 MG PO TABS
7.5000 mg | ORAL_TABLET | Freq: Once | ORAL | Status: AC
  Administered 2013-03-20: 7.5 mg via ORAL
  Filled 2013-03-20 (×2): qty 1

## 2013-03-20 MED ORDER — HYDROCODONE-ACETAMINOPHEN 5-325 MG PO TABS
1.0000 | ORAL_TABLET | ORAL | Status: DC | PRN
  Administered 2013-03-20 – 2013-03-22 (×7): 2 via ORAL
  Filled 2013-03-20 (×2): qty 2
  Filled 2013-03-20: qty 1
  Filled 2013-03-20 (×3): qty 2
  Filled 2013-03-20: qty 1
  Filled 2013-03-20: qty 2

## 2013-03-20 NOTE — Progress Notes (Signed)
Jennifer Sanford  MRN: [id number] DOB/Age: March 22, 1951 62 y.o. Physician: Jacquelyne Balint Procedure: Procedure(s) (LRB): TOTAL KNEE BILATERAL (Bilateral)     Subjective: Overall doing well, pain as expected but controlled on POs. Passing flatus, taking in fluids and eating OK  Vital Signs Temp:  [98.2 F (36.8 C)-98.5 F (36.9 C)] 98.2 F (36.8 C) (06/28 0600) Pulse Rate:  [70-82] 70 (06/28 0600) Resp:  [14-16] 16 (06/28 0600) BP: (107-124)/(73-76) 107/73 mmHg (06/28 0600) SpO2:  [96 %-100 %] 99 % (06/28 0600)  Lab Results  Recent Labs  03/19/13 0511 03/20/13 0600  WBC 14.1* 11.0*  HGB 9.2* 7.8*  HCT 28.3* 23.8*  PLT 188 170   BMET  Recent Labs  03/18/13 0507 03/19/13 0511  NA 134* 133*  K 3.7 4.3  CL 101 99  CO2 26 28  GLUCOSE 120* 143*  BUN 11 11  CREATININE 0.74 0.95  CALCIUM 8.1* 9.0   INR  Date Value Range Status  03/20/2013 1.15  0.00 - 1.49 Final     Exam Bilateral knee incisions with just scant bloody drainage. Minimal swelling Calves soft NVI        Plan Bilat TKA Post op anemia-minimally symptommatic  Will continue PT/OT with plans for inpatient rehab stay Monday Follow HGB, should do ok hepwell IV  Chipper Koudelka for Dr.Kevin Supple 03/20/2013, 8:00 AM

## 2013-03-20 NOTE — Progress Notes (Signed)
Physical Therapy Treatment Patient Details Name: Jennifer Sanford MRN: [id number] DOB: 02-26-1951 Today's Date: 03/20/2013 Time: 1191-4782 PT Time Calculation (min): 50 min  PT Assessment / Plan / Recommendation  PT Comments     Follow Up Recommendations  CIR     Does the patient have the potential to tolerate intense rehabilitation     Barriers to Discharge        Equipment Recommendations  None recommended by PT    Recommendations for Other Services OT consult  Frequency 7X/week   Progress towards PT Goals Progress towards PT goals: Progressing toward goals  Plan Current plan remains appropriate    Precautions / Restrictions Precautions Precautions: Knee;Fall Required Braces or Orthoses: Knee Immobilizer - Right;Knee Immobilizer - Left Knee Immobilizer - Right: Discontinue once straight leg raise with < 10 degree lag Knee Immobilizer - Left: Discontinue once straight leg raise with < 10 degree lag Restrictions Weight Bearing Restrictions: No Other Position/Activity Restrictions: WBAT   Pertinent Vitals/Pain    Mobility  Bed Mobility Bed Mobility: Sit to Supine Sit to Supine: 1: +2 Total assist Sit to Supine: Patient Percentage: 30% Details for Bed Mobility Assistance: cues for sequence and use of UEs to self assist Transfers Transfers: Sit to Stand;Stand to Sit Sit to Stand: 1: +2 Total assist;From chair/3-in-1;With upper extremity assist Sit to Stand: Patient Percentage: 30% Stand to Sit: 1: +2 Total assist;To chair/3-in-1;With upper extremity assist;With armrests;To bed Stand to Sit: Patient Percentage: 40% Details for Transfer Assistance: cues and assist for use of UEs and management of LEs Ambulation/Gait Ambulation/Gait Assistance: 1: +2 Total assist Ambulation/Gait: Patient Percentage: 60% Ambulation Distance (Feet): 5 Feet (twice) Assistive device: Rolling walker Ambulation/Gait Assistance Details: cues for posture, position from RW, UE WB and  sequence Gait Pattern: Step-to pattern;Trunk flexed;Decreased stride length Stairs: No    Exercises     PT Diagnosis:    PT Problem List:   PT Treatment Interventions:     PT Goals (current goals can now be found in the care plan section) Acute Rehab PT Goals Patient Stated Goal: rehab then home  Visit Information  Last PT Received On: 03/20/13 Assistance Needed: +2 History of Present Illness: Bil TKR    Subjective Data  Patient Stated Goal: rehab then home   Cognition  Cognition Arousal/Alertness: Awake/alert Behavior During Therapy: WFL for tasks assessed/performed Overall Cognitive Status: Within Functional Limits for tasks assessed    Balance     End of Session PT - End of Session Equipment Utilized During Treatment: Right knee immobilizer;Left knee immobilizer;Gait belt Activity Tolerance: Patient limited by fatigue;Patient limited by pain Patient left: in bed;with call bell/phone within reach;with family/visitor present Nurse Communication: Mobility status CPM Right Knee CPM Right Knee: On   GP     Cayman Kielbasa 03/20/2013, 4:36 PM

## 2013-03-20 NOTE — Progress Notes (Signed)
Pt stated "sleepy"after benadryl earlier this am, itching after po Oxycodone. Takes Norco without itching at home pta. Will call MD for change in med.

## 2013-03-20 NOTE — Progress Notes (Signed)
Occupational Therapy Treatment Patient Details Name: Jennifer Sanford MRN: [id number] DOB: Apr 29, 1951 Today's Date: 03/20/2013 Time: 1015-1100 OT Time Calculation (min): 45 min  OT Assessment / Plan / Recommendation  OT comments  Pt making progress  Follow Up Recommendations  CIR             Frequency Min 3X/week   Progress towards OT Goals Progress towards OT goals: Progressing toward goals  Plan Discharge plan remains appropriate    Precautions / Restrictions Precautions Precautions: Knee;Fall Required Braces or Orthoses: Knee Immobilizer - Right;Knee Immobilizer - Left Knee Immobilizer - Right: Discontinue once straight leg raise with < 10 degree lag Knee Immobilizer - Left: Discontinue once straight leg raise with < 10 degree lag Restrictions Weight Bearing Restrictions: No Other Position/Activity Restrictions: WBAT       ADL  Toilet Transfer: Performed;+2 Total assistance Toilet Transfer: Patient Percentage: 40% Toilet Transfer Method: Sit to Barista: Extra wide bedside commode Toileting - Clothing Manipulation and Hygiene: +2 Total assistance Toileting - Clothing Manipulation and Hygiene: Patient Percentage: 50% Where Assessed - Toileting Clothing Manipulation and Hygiene: Standing      OT Goals(current goals can now be found in the care plan section) Acute Rehab OT Goals Patient Stated Goal: rehab then home  Visit Information  Last OT Received On: 03/20/13 Assistance Needed: +2 History of Present Illness: Bil TKR          Cognition  Cognition Arousal/Alertness: Awake/alert Behavior During Therapy: WFL for tasks assessed/performed Overall Cognitive Status: Within Functional Limits for tasks assessed    Mobility  Bed Mobility Bed Mobility: Supine to Sit Supine to Sit: Patient Percentage: 60% Transfers Sit to Stand: Patient Percentage: 40% Stand to Sit: Patient Percentage: 40%    Exercises  Total Joint Exercises Ankle  Circles/Pumps: AROM;Supine;Both;20 reps Quad Sets: AROM;AAROM;Both;Supine;20 reps Heel Slides: AAROM;Supine;20 reps (++ muscle guarding) Straight Leg Raises: AAROM;Both;Supine;20 reps Goniometric ROM: L knee to 40 with muscle guarding, R knee to 30 with muscle guarding      End of Session OT - End of Session Activity Tolerance: Patient tolerated treatment well Patient left: in chair;with call bell/phone within reach;with family/visitor present Nurse Communication: Mobility status  GO     Alba Cory 03/20/2013, 11:13 AM

## 2013-03-20 NOTE — Progress Notes (Signed)
Physical Therapy Treatment Patient Details Name: ADALYNA GODBEE MRN: [id number] DOB: 13-Dec-1950 Today's Date: 03/20/2013 Time: 0953-     PT Assessment / Plan / Recommendation  PT Comments   Increased time all tasks.  Follow Up Recommendations  CIR     Does the patient have the potential to tolerate intense rehabilitation     Barriers to Discharge        Equipment Recommendations  None recommended by PT    Recommendations for Other Services OT consult  Frequency 7X/week   Progress towards PT Goals Progress towards PT goals: Progressing toward goals  Plan Current plan remains appropriate    Precautions / Restrictions Precautions Precautions: Knee;Fall Required Braces or Orthoses: Knee Immobilizer - Right;Knee Immobilizer - Left Knee Immobilizer - Right: Discontinue once straight leg raise with < 10 degree lag Knee Immobilizer - Left: Discontinue once straight leg raise with < 10 degree lag Restrictions Weight Bearing Restrictions: No Other Position/Activity Restrictions: WBAT   Pertinent Vitals/Pain 6/10; premed, cold packs provided    Mobility  Bed Mobility Bed Mobility: Supine to Sit Supine to Sit: 1: +2 Total assist Supine to Sit: Patient Percentage: 60% Details for Bed Mobility Assistance: cues for sequence and use of UEs to self assist Transfers Transfers: Sit to Stand;Stand to Sit Sit to Stand: 1: +2 Total assist;From chair/3-in-1;With armrests;From bed Sit to Stand: Patient Percentage: 40% Stand to Sit: 1: +2 Total assist;To chair/3-in-1;With upper extremity assist;With armrests Stand to Sit: Patient Percentage: 40% Details for Transfer Assistance: cues and assist for use of UEs and management of LEs Ambulation/Gait Ambulation/Gait Assistance: 1: +2 Total assist Ambulation/Gait: Patient Percentage: 60% Ambulation Distance (Feet): 10 Feet (and 3') Assistive device: Rolling walker Ambulation/Gait Assistance Details: cues for posture, sequence, position from  RW, stride length and increased UE WB Gait Pattern: Step-to pattern;Trunk flexed;Decreased stride length    Exercises Total Joint Exercises Ankle Circles/Pumps: AROM;Supine;Both;20 reps Quad Sets: AROM;AAROM;Both;Supine;20 reps Heel Slides: AAROM;Supine;20 reps (++ muscle guarding) Straight Leg Raises: AAROM;Both;Supine;20 reps Goniometric ROM: L knee to 40 with muscle guarding, R knee to 30 with muscle guarding   PT Diagnosis:    PT Problem List:   PT Treatment Interventions:     PT Goals (current goals can now be found in the care plan section) Acute Rehab PT Goals Patient Stated Goal: rehab then home  Visit Information  Last PT Received On: 03/20/13 Assistance Needed: +2 PT/OT Co-Evaluation/Treatment: Yes History of Present Illness: Bil TKR    Subjective Data  Subjective: I feel dizzy - with move to sitting and standing Patient Stated Goal: rehab then home   Cognition  Cognition Arousal/Alertness: Awake/alert Behavior During Therapy: WFL for tasks assessed/performed Overall Cognitive Status: Within Functional Limits for tasks assessed    Balance     End of Session PT - End of Session Equipment Utilized During Treatment: Right knee immobilizer;Left knee immobilizer;Gait belt Activity Tolerance: Patient limited by fatigue;Patient limited by pain Patient left: in chair;with call bell/phone within reach;with family/visitor present Nurse Communication: Mobility status   GP     Janetta Vandoren 03/20/2013, 1:37 PM

## 2013-03-20 NOTE — Progress Notes (Signed)
ANTICOAGULATION CONSULT NOTE - Follow up  Pharmacy Consult for warfarin Indication: s/p bilateral TKA  No Known Allergies  Patient Measurements: Height: 5\' 11"  (180.3 cm) Weight: 224 lb (101.606 kg) IBW/kg (Calculated) : 70.8  Vital Signs: Temp: 98.2 F (36.8 C) (06/28 0600) Temp src: Oral (06/28 0600) BP: 107/73 mmHg (06/28 0600) Pulse Rate: 70 (06/28 0600)  Labs:  Recent Labs  03/18/13 0507 03/19/13 0511 03/20/13 0600  HGB 9.7* 9.2* 7.8*  HCT 29.7* 28.3* 23.8*  PLT 183 188 170  LABPROT 13.7 15.6* 14.5  INR 1.07 1.27 1.15  CREATININE 0.74 0.95  --    Medications:  Scheduled:  . atorvastatin  40 mg Oral q1800  . citalopram  10 mg Oral q morning - 10a  . docusate sodium  100 mg Oral BID  . enoxaparin (LOVENOX) injection  30 mg Subcutaneous Q12H  . iron polysaccharides  150 mg Oral Daily  . pantoprazole  40 mg Oral Daily  . polyethylene glycol  17 g Oral Daily  . triamterene-hydrochlorothiazide  0.5 tablet Oral q morning - 10a  . warfarin   Does not apply Once  . Warfarin - Pharmacist Dosing Inpatient   Does not apply q1800   Assessment: 62 yo s/p Bilateral TKA on 6/25 with epidural in for pain control.  Lovenox for DVT ppx started at appropriate time after pulling epidural in combo with warfarin until INR therapeutic. Warfarin to start 6/26.  Epidural was removed 6/27 at 10:22 AM w/o complications.    Lovenox ppx dose started 6/27 at 2300.  INR = 1.15 after 2 warfarin doses (2.5mg , 5mg )    CBC:  Hgb decreased to 7.8, Plt remain wnl.  Follow.  No bleeding or complications reported.  Goal of Therapy:  INR 2-3   Plan:   Warfarin 7.5mg  tonight  Daily PT/INR  Warfarin education materials provided, pt aware of video information.  Warfarin counseling begun 6/27, patient drowsy, will need follow-up visit for education.   Lynann Beaver PharmD, BCPS Pager [phone] 03/20/2013 10:54 AM

## 2013-03-21 DIAGNOSIS — Z9289 Personal history of other medical treatment: Secondary | ICD-10-CM

## 2013-03-21 HISTORY — DX: Personal history of other medical treatment: Z92.89

## 2013-03-21 LAB — CBC
HCT: 21.5 % — ABNORMAL LOW (ref 36.0–46.0)
Hemoglobin: 7.2 g/dL — ABNORMAL LOW (ref 12.0–15.0)
MCH: 29.8 pg (ref 26.0–34.0)
MCHC: 33.5 g/dL (ref 30.0–36.0)
MCV: 88.8 fL (ref 78.0–100.0)
Platelets: 204 10*3/uL (ref 150–400)
RBC: 2.42 MIL/uL — ABNORMAL LOW (ref 3.87–5.11)
RDW: 13.9 % (ref 11.5–15.5)
WBC: 10.1 10*3/uL (ref 4.0–10.5)

## 2013-03-21 LAB — PROTIME-INR
INR: 1.12 (ref 0.00–1.49)
Prothrombin Time: 14.2 seconds (ref 11.6–15.2)

## 2013-03-21 LAB — BASIC METABOLIC PANEL
BUN: 16 mg/dL (ref 6–23)
CO2: 28 mEq/L (ref 19–32)
Calcium: 8.8 mg/dL (ref 8.4–10.5)
Chloride: 99 mEq/L (ref 96–112)
Creatinine, Ser: 0.91 mg/dL (ref 0.50–1.10)
GFR calc Af Amer: 78 mL/min — ABNORMAL LOW (ref >90)
GFR calc non Af Amer: 68 mL/min — ABNORMAL LOW (ref >90)
Glucose, Bld: 114 mg/dL — ABNORMAL HIGH (ref 70–99)
Potassium: 3.9 mEq/L (ref 3.5–5.1)
Sodium: 135 mEq/L (ref 135–145)

## 2013-03-21 LAB — PREPARE RBC (CROSSMATCH)

## 2013-03-21 MED ORDER — WARFARIN SODIUM 10 MG PO TABS
10.0000 mg | ORAL_TABLET | Freq: Once | ORAL | Status: DC
  Administered 2013-03-21: 10 mg via ORAL
  Filled 2013-03-21 (×2): qty 1

## 2013-03-21 MED ORDER — ACETAMINOPHEN 325 MG PO TABS
650.0000 mg | ORAL_TABLET | Freq: Once | ORAL | Status: AC
  Administered 2013-03-21: 650 mg via ORAL
  Filled 2013-03-21: qty 2

## 2013-03-21 NOTE — Progress Notes (Signed)
Pt states no need for Miralax as she has been taking Prune Juice, fruits etc and feels as if she will "have BM soon". Encouraged fluids and Miralax if needed.

## 2013-03-21 NOTE — Progress Notes (Signed)
Physical Therapy Treatment Patient Details Name: Jennifer Sanford MRN: [id number] DOB: 15-Oct-1950 Today's Date: 03/21/2013 Time: 4098-1191 PT Time Calculation (min): 49 min  PT Assessment / Plan / Recommendation  PT Comments     Follow Up Recommendations  CIR     Does the patient have the potential to tolerate intense rehabilitation     Barriers to Discharge        Equipment Recommendations  None recommended by PT    Recommendations for Other Services OT consult  Frequency 7X/week   Progress towards PT Goals    Plan Current plan remains appropriate    Precautions / Restrictions Precautions Precautions: Knee;Fall Required Braces or Orthoses: Knee Immobilizer - Right;Knee Immobilizer - Left Knee Immobilizer - Right: Discontinue once straight leg raise with < 10 degree lag Knee Immobilizer - Left: Discontinue once straight leg raise with < 10 degree lag Restrictions Weight Bearing Restrictions: No Other Position/Activity Restrictions: WBAT   Pertinent Vitals/Pain     Mobility       Exercises Total Joint Exercises Ankle Circles/Pumps: AROM;Supine;Both;20 reps Quad Sets: AROM;AAROM;Both;Supine;20 reps Heel Slides: AAROM;Supine;20 reps;Both Straight Leg Raises: AAROM;Both;Supine;20 reps   PT Diagnosis:    PT Problem List:   PT Treatment Interventions:     PT Goals (current goals can now be found in the care plan section) Acute Rehab PT Goals Patient Stated Goal: rehab then home  Visit Information  Last PT Received On: 03/21/13 Assistance Needed: +2 History of Present Illness: Bil TKR    Subjective Data  Subjective: I had to wait for a new IV site before they could start my transfusion Patient Stated Goal: rehab then home   Cognition  Cognition Arousal/Alertness: Awake/alert Behavior During Therapy: WFL for tasks assessed/performed Overall Cognitive Status: Within Functional Limits for tasks assessed    Balance     End of Session PT - End of  Session Activity Tolerance: Patient tolerated treatment well Patient left: in chair;with call bell/phone within reach;with nursing/sitter in room;with family/visitor present   GP     Tana Trefry 03/21/2013, 5:20 PM

## 2013-03-21 NOTE — Progress Notes (Signed)
Physical Therapy Treatment Patient Details Name: Jennifer Sanford MRN: [id number] DOB: [birth year] Today's Date: 03/21/2013 Time: 0865-7846 PT Time Calculation (min): 18 min  PT Assessment / Plan / Recommendation  PT Comments   Pt Hgb at 7.2 with transfusion pending  Follow Up Recommendations  CIR     Does the patient have the potential to tolerate intense rehabilitation     Barriers to Discharge        Equipment Recommendations  None recommended by PT    Recommendations for Other Services OT consult  Frequency 7X/week   Progress towards PT Goals Progress towards PT goals: Progressing toward goals  Plan Current plan remains appropriate    Precautions / Restrictions Precautions Precautions: Knee;Fall Required Braces or Orthoses: Knee Immobilizer - Right;Knee Immobilizer - Left Knee Immobilizer - Right: Discontinue once straight leg raise with < 10 degree lag Knee Immobilizer - Left: Discontinue once straight leg raise with < 10 degree lag Restrictions Weight Bearing Restrictions: No Other Position/Activity Restrictions: WBAT   Pertinent Vitals/Pain 4/10;     Mobility  Transfers Transfers: Sit to Stand;Stand to Sit Sit to Stand: 1: +2 Total assist;From chair/3-in-1;With upper extremity assist Sit to Stand: Patient Percentage: 60% Stand to Sit: 1: +2 Total assist;To chair/3-in-1;With upper extremity assist;With armrests Stand to Sit: Patient Percentage: 60% Details for Transfer Assistance: cues and assist for use of UEs and management of LEs. Marked improvement in abilityt to pull LEs under while pushing up from 3n1 Ambulation/Gait Ambulation/Gait Assistance: 1: +2 Total assist Ambulation/Gait: Patient Percentage: 70% Ambulation Distance (Feet): 24 Feet Assistive device: Rolling walker Ambulation/Gait Assistance Details: cues for posture, position from RW, sequence, and UE WB Gait Pattern: Step-to pattern;Trunk flexed;Decreased stride length Stairs: No    Exercises      PT Diagnosis:    PT Problem List:   PT Treatment Interventions:     PT Goals (current goals can now be found in the care plan section) Acute Rehab PT Goals Patient Stated Goal: rehab then home  Visit Information  Last PT Received On: 03/21/13 Assistance Needed: +2 History of Present Illness: Bil TKR    Subjective Data  Patient Stated Goal: rehab then home   Cognition  Cognition Arousal/Alertness: Awake/alert Behavior During Therapy: WFL for tasks assessed/performed Overall Cognitive Status: Within Functional Limits for tasks assessed    Balance     End of Session PT - End of Session Equipment Utilized During Treatment: Right knee immobilizer;Left knee immobilizer;Gait belt Activity Tolerance: Patient limited by fatigue Patient left: in chair;with call bell/phone within reach;with nursing/sitter in room;with family/visitor present Nurse Communication: Mobility status   GP     Victorhugo Preis 03/21/2013, 10:01 AM

## 2013-03-21 NOTE — Progress Notes (Signed)
ANTICOAGULATION CONSULT NOTE - Follow up  Pharmacy Consult for warfarin Indication: s/p bilateral TKA  No Known Allergies  Patient Measurements: Height: 5\' 11"  (180.3 cm) Weight: 224 lb (101.606 kg) IBW/kg (Calculated) : 70.8  Vital Signs: Temp: 98.8 F (37.1 C) (06/29 0400) Temp src: Oral (06/29 0400) BP: 128/77 mmHg (06/29 0400) Pulse Rate: 99 (06/29 0400)  Labs:  Recent Labs  03/19/13 0511 03/20/13 0600 03/21/13 0455  HGB 9.2* 7.8* 7.2*  HCT 28.3* 23.8* 21.5*  PLT 188 170 204  LABPROT 15.6* 14.5 14.2  INR 1.27 1.15 1.12  CREATININE 0.95  --  0.91   Medications:  Scheduled:  . acetaminophen  650 mg Oral Once  . atorvastatin  40 mg Oral q1800  . citalopram  10 mg Oral q morning - 10a  . docusate sodium  100 mg Oral BID  . enoxaparin (LOVENOX) injection  30 mg Subcutaneous Q12H  . iron polysaccharides  150 mg Oral Daily  . pantoprazole  40 mg Oral Daily  . polyethylene glycol  17 g Oral Daily  . triamterene-hydrochlorothiazide  0.5 tablet Oral q morning - 10a  . warfarin   Does not apply Once  . Warfarin - Pharmacist Dosing Inpatient   Does not apply q1800   Assessment: 62 yo s/p Bilateral TKA on 6/25 with epidural in for pain control.  Lovenox for DVT ppx started at appropriate time after pulling epidural in combo with warfarin until INR therapeutic. Warfarin to start 6/26.  Epidural was removed 6/27 at 10:22 AM w/o complications.  Lovenox ppx dose started 6/27 at 2300.  CBC:  Hgb further decreased to 7.2, Plt remain wnl.  Pt to receive PRBC transfusion today.  No bleeding or complications reported.  Follow.  INR = 1.12, remains subtherapeutic.  Will give increased boosted dose tonight.  Goal of Therapy:  INR 2-3   Plan:   Warfarin 10 mg tonight  Daily PT/INR  Warfarin education completed 6/28 pm with teach-back.   Lynann Beaver PharmD, BCPS Pager [phone] 03/21/2013 10:56 AM

## 2013-03-21 NOTE — Progress Notes (Signed)
   Subjective: 4 Days Post-Op Procedure(s) (LRB): TOTAL KNEE BILATERAL (Bilateral) Patient reports pain as mild.   Patient seen in rounds  Dr. Lequita Halt.  Pain is better today. Patient is well, but has had some minor complaints of pain in the knees, requiring pain medications Plan is to go Rehab after hospital stay.  Objective: Vital signs in last 24 hours: Temp:  [98.8 F (37.1 C)-100.1 F (37.8 C)] 98.8 F (37.1 C) (06/29 0400) Pulse Rate:  [99-103] 99 (06/29 0400) Resp:  [16-18] 16 (06/29 0400) BP: (124-128)/(66-77) 128/77 mmHg (06/29 0400) SpO2:  [99 %-100 %] 99 % (06/29 0400)  Intake/Output from previous day:  Intake/Output Summary (Last 24 hours) at 03/21/13 0722 Last data filed at 03/21/13 0643  Gross per 24 hour  Intake    480 ml  Output   2800 ml  Net  -2320 ml    Intake/Output this shift:    Labs:  Recent Labs  03/19/13 0511 03/20/13 0600 03/21/13 0455  HGB 9.2* 7.8* 7.2*    Recent Labs  03/19/13 0511 03/20/13 0600 03/21/13 0455  WBC 14.1* 11.0* 10.1  RBC 3.11* 2.64* 2.42*  HCT 28.3* 23.8* 21.5*  PLT 188 170 204    Recent Labs  03/19/13 0511 03/21/13 0455  NA 133* 135  K 4.3 3.9  CL 99 99  BUN 11 16  CREATININE 0.95 0.91  GLUCOSE 143* 114*  CALCIUM 9.0 8.8    Recent Labs  03/19/13 0511 03/20/13 0600 03/21/13 0455  INR 1.27 1.15 1.12    EXAM General - Patient is Alert, Appropriate and Oriented Extremity - Neurovascular intact Sensation intact distally Dorsiflexion/Plantar flexion intact No cellulitis present Dressing/Incision - clean, dry, no drainage, healing Motor Function - intact, moving feet and toes well on exam.    Past Medical History  Diagnosis Date  . Hypertension   . Hypercholesteremia   . Arthritis   . Depression   . GERD (gastroesophageal reflux disease)     Assessment/Plan: 4 Days Post-Op Procedure(s) (LRB): TOTAL KNEE BILATERAL (Bilateral) Principal Problem:   OA (osteoarthritis) of knee Active  Problems:   Postoperative anemia due to acute blood loss   Hyponatremia   Postop Transfusion  Estimated body mass index is 31.26 kg/(m^2) as calculated from the following:   Height as of this encounter: 5\' 11"  (1.803 m).   Weight as of this encounter: 101.606 kg (224 lb). Up with therapy Continue foley due to urinary output monitoring and she still has her epidural in place.  Will remove the catheter six hours after the epidural is removed.  Will need to note in chart when the epidural is pulled.  DVT Prophylaxis - Lovenox and Coumadin, INR is 1.12 today Weight-Bearing as tolerated to both legs Blood today and recheck labs in the morning.   Take Coumadin for four weeks and then discontinue.  The dose may need to be adjusted based upon the INR.  Please follow the INR and titrate Coumadin dose for a therapeutic range between 2.0 and 3.0 INR.  After completing the four weeks of Coumadin, the patient may stop the Coumadin and resume their 81 mg Aspirin daily.    PERKINS, ALEXZANDREW 03/21/2013, 7:22 AM

## 2013-03-22 ENCOUNTER — Inpatient Hospital Stay (HOSPITAL_COMMUNITY)
Admission: RE | Admit: 2013-03-22 | Discharge: 2013-04-02 | DRG: 945 | Disposition: A | Discharge status: Home Health | Payer: 59 | Source: Intra-hospital | Attending: Physical Medicine & Rehabilitation | Admitting: Physical Medicine & Rehabilitation

## 2013-03-22 DIAGNOSIS — D62 Acute posthemorrhagic anemia: Secondary | ICD-10-CM

## 2013-03-22 DIAGNOSIS — E785 Hyperlipidemia, unspecified: Secondary | ICD-10-CM

## 2013-03-22 DIAGNOSIS — M79609 Pain in unspecified limb: Secondary | ICD-10-CM

## 2013-03-22 DIAGNOSIS — Z96659 Presence of unspecified artificial knee joint: Secondary | ICD-10-CM

## 2013-03-22 DIAGNOSIS — Z96653 Presence of artificial knee joint, bilateral: Secondary | ICD-10-CM

## 2013-03-22 DIAGNOSIS — M1710 Unilateral primary osteoarthritis, unspecified knee: Secondary | ICD-10-CM

## 2013-03-22 DIAGNOSIS — Z79899 Other long term (current) drug therapy: Secondary | ICD-10-CM

## 2013-03-22 DIAGNOSIS — R7989 Other specified abnormal findings of blood chemistry: Secondary | ICD-10-CM

## 2013-03-22 DIAGNOSIS — IMO0002 Reserved for concepts with insufficient information to code with codable children: Secondary | ICD-10-CM

## 2013-03-22 DIAGNOSIS — Z5189 Encounter for other specified aftercare: Principal | ICD-10-CM

## 2013-03-22 DIAGNOSIS — E871 Hypo-osmolality and hyponatremia: Secondary | ICD-10-CM

## 2013-03-22 DIAGNOSIS — I10 Essential (primary) hypertension: Secondary | ICD-10-CM

## 2013-03-22 DIAGNOSIS — K219 Gastro-esophageal reflux disease without esophagitis: Secondary | ICD-10-CM

## 2013-03-22 DIAGNOSIS — F3289 Other specified depressive episodes: Secondary | ICD-10-CM

## 2013-03-22 DIAGNOSIS — F329 Major depressive disorder, single episode, unspecified: Secondary | ICD-10-CM

## 2013-03-22 LAB — TYPE AND SCREEN
ABO/RH(D): O POS
Antibody Screen: NEGATIVE
Unit division: 0
Unit division: 0

## 2013-03-22 LAB — CBC
HCT: 28 % — ABNORMAL LOW (ref 36.0–46.0)
Hemoglobin: 9.4 g/dL — ABNORMAL LOW (ref 12.0–15.0)
MCH: 29.2 pg (ref 26.0–34.0)
MCHC: 33.6 g/dL (ref 30.0–36.0)
MCV: 87 fL (ref 78.0–100.0)
Platelets: 227 10*3/uL (ref 150–400)
RBC: 3.22 MIL/uL — ABNORMAL LOW (ref 3.87–5.11)
RDW: 14.7 % (ref 11.5–15.5)
WBC: 12.1 10*3/uL — ABNORMAL HIGH (ref 4.0–10.5)

## 2013-03-22 LAB — BASIC METABOLIC PANEL
BUN: 13 mg/dL (ref 6–23)
CO2: 30 mEq/L (ref 19–32)
Calcium: 8.9 mg/dL (ref 8.4–10.5)
Chloride: 95 mEq/L — ABNORMAL LOW (ref 96–112)
Creatinine, Ser: 0.76 mg/dL (ref 0.50–1.10)
GFR calc Af Amer: >90 mL/min (ref >90)
GFR calc non Af Amer: >90 mL/min (ref >90)
Glucose, Bld: 111 mg/dL — ABNORMAL HIGH (ref 70–99)
Potassium: 4.3 mEq/L (ref 3.5–5.1)
Sodium: 132 mEq/L — ABNORMAL LOW (ref 135–145)

## 2013-03-22 LAB — PROTIME-INR
INR: 1.14 (ref 0.00–1.49)
Prothrombin Time: 14.4 seconds (ref 11.6–15.2)

## 2013-03-22 MED ORDER — TRIAMTERENE-HCTZ 37.5-25 MG PO TABS
0.5000 | ORAL_TABLET | Freq: Every morning | ORAL | Status: DC
  Administered 2013-03-23 – 2013-04-01 (×8): 0.5 via ORAL
  Filled 2013-03-22 (×11): qty 0.5

## 2013-03-22 MED ORDER — POLYETHYLENE GLYCOL 3350 17 G PO PACK
17.0000 g | PACK | Freq: Every day | ORAL | Status: DC
  Administered 2013-03-22 – 2013-04-02 (×11): 17 g via ORAL
  Filled 2013-03-22 (×13): qty 1

## 2013-03-22 MED ORDER — CITALOPRAM HYDROBROMIDE 10 MG PO TABS
10.0000 mg | ORAL_TABLET | Freq: Every morning | ORAL | Status: DC
  Administered 2013-03-23 – 2013-04-02 (×11): 10 mg via ORAL
  Filled 2013-03-22 (×11): qty 1

## 2013-03-22 MED ORDER — WARFARIN SODIUM 10 MG PO TABS: 10.0000 mg | ORAL_TABLET | Freq: Once | ORAL | Status: DC

## 2013-03-22 MED ORDER — TRAMADOL HCL 50 MG PO TABS
50.0000 mg | ORAL_TABLET | Freq: Four times a day (QID) | ORAL | Status: DC | PRN
  Administered 2013-03-23 (×2): 100 mg via ORAL
  Administered 2013-03-24: 50 mg via ORAL
  Administered 2013-03-25 (×2): 100 mg via ORAL
  Administered 2013-03-26 (×2): 50 mg via ORAL
  Administered 2013-03-26 – 2013-03-27 (×2): 100 mg via ORAL
  Administered 2013-03-27: 50 mg via ORAL
  Administered 2013-03-27 – 2013-03-29 (×7): 100 mg via ORAL
  Administered 2013-03-30: 50 mg via ORAL
  Administered 2013-03-30: 100 mg via ORAL
  Administered 2013-03-30 – 2013-04-01 (×9): 50 mg via ORAL
  Administered 2013-04-01: 100 mg via ORAL
  Administered 2013-04-01 – 2013-04-02 (×3): 50 mg via ORAL
  Filled 2013-03-22: qty 2
  Filled 2013-03-22: qty 1
  Filled 2013-03-22: qty 2
  Filled 2013-03-22: qty 1
  Filled 2013-03-22: qty 2
  Filled 2013-03-22: qty 1
  Filled 2013-03-22: qty 2
  Filled 2013-03-22: qty 1
  Filled 2013-03-22 (×5): qty 2
  Filled 2013-03-22: qty 1
  Filled 2013-03-22: qty 2
  Filled 2013-03-22: qty 1
  Filled 2013-03-22: qty 2
  Filled 2013-03-22: qty 1
  Filled 2013-03-22: qty 2
  Filled 2013-03-22 (×5): qty 1
  Filled 2013-03-22: qty 2
  Filled 2013-03-22 (×2): qty 1
  Filled 2013-03-22 (×3): qty 2
  Filled 2013-03-22 (×3): qty 1
  Filled 2013-03-22: qty 2

## 2013-03-22 MED ORDER — ONDANSETRON HCL 4 MG/2ML IJ SOLN
4.0000 mg | Freq: Four times a day (QID) | INTRAMUSCULAR | Status: DC | PRN
Start: 2013-03-22 — End: 2013-04-02

## 2013-03-22 MED ORDER — DIPHENHYDRAMINE HCL 12.5 MG/5ML PO ELIX: 12.5000 mg | ORAL_SOLUTION | ORAL | Status: DC | PRN

## 2013-03-22 MED ORDER — SORBITOL 70 % SOLN: 30.0000 mL | Freq: Every day | Status: DC | PRN

## 2013-03-22 MED ORDER — ACETAMINOPHEN 325 MG PO TABS: 325.0000 mg | ORAL_TABLET | ORAL | Status: DC | PRN

## 2013-03-22 MED ORDER — WARFARIN - PHARMACIST DOSING INPATIENT: 5.0000 | Freq: Every day | Status: DC

## 2013-03-22 MED ORDER — TRAMADOL HCL 50 MG PO TABS: 50.0000 mg | ORAL_TABLET | Freq: Four times a day (QID) | ORAL | Status: DC | PRN

## 2013-03-22 MED ORDER — METOCLOPRAMIDE HCL 5 MG PO TABS: 5.0000 mg | ORAL_TABLET | Freq: Three times a day (TID) | ORAL | Status: DC | PRN

## 2013-03-22 MED ORDER — HYDROCODONE-ACETAMINOPHEN 5-325 MG PO TABS
1.0000 | ORAL_TABLET | ORAL | Status: DC | PRN
  Administered 2013-03-22: 1 via ORAL
  Administered 2013-03-22: 2 via ORAL
  Administered 2013-03-23: 1 via ORAL
  Filled 2013-03-22: qty 2
  Filled 2013-03-22: qty 1
  Filled 2013-03-22: qty 2
  Filled 2013-03-22: qty 1

## 2013-03-22 MED ORDER — SENNOSIDES-DOCUSATE SODIUM 8.6-50 MG PO TABS
2.0000 | ORAL_TABLET | Freq: Every day | ORAL | Status: DC
  Administered 2013-03-22 – 2013-04-01 (×11): 2 via ORAL
  Filled 2013-03-22 (×11): qty 2

## 2013-03-22 MED ORDER — POLYSACCHARIDE IRON COMPLEX 150 MG PO CAPS: 150.0000 mg | ORAL_CAPSULE | Freq: Every day | ORAL | Status: DC

## 2013-03-22 MED ORDER — ONDANSETRON HCL 4 MG PO TABS: 4.0000 mg | ORAL_TABLET | Freq: Four times a day (QID) | ORAL | Status: DC | PRN

## 2013-03-22 MED ORDER — METHOCARBAMOL 500 MG PO TABS
500.0000 mg | ORAL_TABLET | Freq: Four times a day (QID) | ORAL | Status: DC | PRN
  Administered 2013-03-22 – 2013-04-02 (×32): 500 mg via ORAL
  Filled 2013-03-22 (×33): qty 1

## 2013-03-22 MED ORDER — WARFARIN SODIUM 10 MG PO TABS
10.0000 mg | ORAL_TABLET | Freq: Once | ORAL | Status: AC
  Administered 2013-03-22: 10 mg via ORAL
  Filled 2013-03-22: qty 1

## 2013-03-22 MED ORDER — ATORVASTATIN CALCIUM 40 MG PO TABS
40.0000 mg | ORAL_TABLET | Freq: Every day | ORAL | Status: DC
  Administered 2013-03-22 – 2013-03-28 (×7): 40 mg via ORAL
  Filled 2013-03-22 (×8): qty 1

## 2013-03-22 MED ORDER — DSS 100 MG PO CAPS: 100.0000 mg | ORAL_CAPSULE | Freq: Two times a day (BID) | ORAL | Status: DC

## 2013-03-22 MED ORDER — BISACODYL 10 MG RE SUPP: 10.0000 mg | Freq: Every day | RECTAL | Status: DC | PRN

## 2013-03-22 MED ORDER — POLYETHYLENE GLYCOL 3350 17 G PO PACK: 17.0000 g | PACK | Freq: Every day | ORAL | Status: DC | PRN

## 2013-03-22 MED ORDER — METHOCARBAMOL 500 MG PO TABS: 500.0000 mg | ORAL_TABLET | Freq: Four times a day (QID) | ORAL | Status: DC | PRN

## 2013-03-22 MED ORDER — WARFARIN - PHARMACIST DOSING INPATIENT
Freq: Every day | Status: DC
  Administered 2013-03-26: 18:00:00

## 2013-03-22 MED ORDER — PANTOPRAZOLE SODIUM 40 MG PO TBEC
40.0000 mg | DELAYED_RELEASE_TABLET | Freq: Every day | ORAL | Status: DC
  Administered 2013-03-22 – 2013-03-29 (×8): 40 mg via ORAL
  Filled 2013-03-22 (×8): qty 1

## 2013-03-22 MED ORDER — POLYSACCHARIDE IRON COMPLEX 150 MG PO CAPS
150.0000 mg | ORAL_CAPSULE | Freq: Every day | ORAL | Status: DC
  Administered 2013-03-22 – 2013-04-02 (×12): 150 mg via ORAL
  Filled 2013-03-22 (×14): qty 1

## 2013-03-22 MED ORDER — HYDROCODONE-ACETAMINOPHEN 5-325 MG PO TABS: 1.0000 | ORAL_TABLET | ORAL | Status: DC | PRN

## 2013-03-22 MED ORDER — ENOXAPARIN SODIUM 30 MG/0.3ML ~~LOC~~ SOLN
30.0000 mg | Freq: Two times a day (BID) | SUBCUTANEOUS | Status: DC
  Administered 2013-03-22 – 2013-03-25 (×6): 30 mg via SUBCUTANEOUS
  Filled 2013-03-22 (×7): qty 0.3

## 2013-03-22 NOTE — H&P (Signed)
Physical Medicine and Rehabilitation Admission H&P  No chief complaint on file.  :  HPI: Jennifer Sanford is a 62 y.o. right-handed female admitted 03/16/2013 with end stage changes bilateral knees and no relief with conservative care. 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 Bilateral total knee arthroplasty 03/17/2013 per Dr.Alusio. Placed on Coumadin for DVT prophylaxis with subcutaneous Lovenox for INR greater than 2.00. Advised weightbearing as tolerated. Acute blood loss anemia latest hemoglobin 7.2 and transfused with latest hemoglobin 9.4 and monitored. Postoperative pain control with epidural removed 03/19/2013 Physical and occupational therapy evaluations completed an ongoing with recommendations of physical medicine rehabilitation consult to consider inpatient rehabilitation services. Patient was felt to be a good candidate and was admitted for comprehensive rehabilitation program.  Review of Systems  Gastrointestinal:  GERD  Musculoskeletal: Positive for myalgias and joint pain.  Psychiatric/Behavioral: Positive for depression.  All other systems reviewed and are negative  Past Medical History   Diagnosis  Date   .  Hypertension    .  Hypercholesteremia    .  Arthritis    .  Depression    .  GERD (gastroesophageal reflux disease)     Past Surgical History   Procedure  Laterality  Date   .  Knee arthroscopy  Left  2004   .  Abdominal hysterectomy   2002     partial   .  Breast reduction surgery   2007   .  Tonsillectomy   as child   .  Total knee arthroplasty  Bilateral  03/17/2013     Procedure: TOTAL KNEE BILATERAL; Surgeon: Loanne Drilling, MD; Location: [facility]; Service: Orthopedics; Laterality: Bilateral;    Family History   Problem  Relation  Age of Onset   .  Diabetes  Mother    .  Hypertension  Father    .  Cancer  Father    .  Cancer  Other     Social History: reports that she has never smoked. She has never used smokeless tobacco. She  reports that she does not drink alcohol or use illicit drugs.  Allergies: No Known Allergies  Medications Prior to Admission   Medication  Sig  Dispense  Refill   .  citalopram (CELEXA) 10 MG tablet  Take 10 mg by mouth every morning.     .  fish oil-omega-3 fatty acids 1000 MG capsule  Take 1 g by mouth daily.     .  pantoprazole (PROTONIX) 40 MG tablet  Take 40 mg by mouth daily.     .  rosuvastatin (CRESTOR) 20 MG tablet  Take 20 mg by mouth every morning.     .  triamterene-hydrochlorothiazide (MAXZIDE-25) 37.5-25 MG per tablet  Take 0.5 tablets by mouth every morning.     .  vitamin B-12 (CYANOCOBALAMIN) 1000 MCG tablet  Take 1,000 mcg by mouth daily.     .  celecoxib (CELEBREX) 400 MG capsule  Take 400 mg by mouth daily.     Marland Kitchen  OVER THE COUNTER MEDICATION  Take 1 tablet by mouth daily. ultrim-weight loss supplement      Home:  Home Living  Family/patient expects to be discharged to:: Inpatient rehab  Living Arrangements: Alone  Available Help at Discharge: Family  Type of Home: House  Home Access: Stairs to enter  [employer] of Steps: one step  Entrance Stairs-Rails: None  Home Layout: Two level;Able to live on main level with bedroom/bathroom  Lives With: Alone  Functional History:   Functional Status:  Mobility:  Bed Mobility  Bed Mobility: Sit to Supine  Supine to Sit: 1: +2 Total assist  Supine to Sit: Patient Percentage: 60%  Sit to Supine: 1: +2 Total assist  Sit to Supine: Patient Percentage: 30%  Transfers  Transfers: Sit to Stand;Stand to Sit  Sit to Stand: 1: +2 Total assist;From chair/3-in-1;With upper extremity assist  Sit to Stand: Patient Percentage: 60%  Stand to Sit: 1: +2 Total assist;To chair/3-in-1;With upper extremity assist;With armrests  Stand to Sit: Patient Percentage: 60%  Ambulation/Gait  Ambulation/Gait Assistance: 1: +2 Total assist  Ambulation/Gait: Patient Percentage: 70%  Ambulation Distance (Feet): 24 Feet  Assistive  device: Rolling walker  Ambulation/Gait Assistance Details: cues for posture, position from RW, sequence, and UE WB  Gait Pattern: Step-to pattern;Trunk flexed;Decreased stride length  Stairs: No   ADL:  ADL  Upper Body Bathing: Performed;Minimal assistance  Where Assessed - Upper Body Bathing: Supported sitting  Lower Body Bathing: Performed;+2 Total assistance  Where Assessed - Lower Body Bathing: Supported sit to stand  Upper Body Dressing: Performed;Minimal assistance  Where Assessed - Upper Body Dressing: Supported sitting  Lower Body Dressing: Performed;+2 Total assistance  Where Assessed - Lower Body Dressing: Supported sit to Scientist, research (life sciences): Production assistant, radio Method: Sit to Production manager: Extra wide bedside commode  Cognition:  Cognition  Overall Cognitive Status: Within Functional Limits for tasks assessed  Orientation Level: Oriented X4  Cognition  Arousal/Alertness: Awake/alert  Behavior During Therapy: WFL for tasks assessed/performed  Overall Cognitive Status: Within Functional Limits for tasks assessed  Physical Exam:  Blood pressure 116/72, pulse 98, temperature 98.8 F (37.1 C), temperature source Oral, resp. rate 16, height 5\' 11"  (1.803 m), weight 101.606 kg (224 lb), SpO2 95.00%.  Physical Exam  Vitals reviewed.  HENT:  Head: Normocephalic.  Eyes: EOM are normal.  Neck: Normal range of motion. Neck supple. No thyromegaly present.  Cardiovascular: Normal rate and regular rhythm. No murmurs Pulmonary/Chest: Breath sounds normal. No respiratory distress. No wheezes Abdominal: Soft. Bowel sounds are normal. She exhibits no distension. No tenderness Neurological:  She participated fully with exam. Patient was oriented x3 To Person, place and date of birth. Follows full commands . motor strength 5/5 in bilateral deltoid, biceps, triceps, grip  Lower extremity strength 2+ to 3 minus left hip flexion knee  extension 4/5 bilateral dorsiflexion plantar flexion trace right hip flexion knee extension  Sensory intact to light touch in both feet  Skin:  Bilateral knee incisions were dressed. Wounds clean, dry , and intact. Neurovascular sensation intact 1+ edema surrounding op sites. Areas were appropriately tender.   Results for orders placed during the hospital encounter of 03/17/13 (from the past 48 hour(s))   PROTIME-INR Status: None    Collection Time    03/21/13 4:55 AM   Result  Value  Range    Prothrombin Time  14.2  11.6 - 15.2 seconds    INR  1.12  0.00 - 1.49   BASIC METABOLIC PANEL Status: Abnormal    Collection Time    03/21/13 4:55 AM   Result  Value  Range    Sodium  135  135 - 145 mEq/L    Potassium  3.9  3.5 - 5.1 mEq/L    Chloride  99  96 - 112 mEq/L    CO2  28  19 - 32 mEq/L    Glucose, Bld  114 (*)  70 - 99 mg/dL    BUN  16  6 - 23 mg/dL    Creatinine, Ser  1.61  0.50 - 1.10 mg/dL    Calcium  8.8  8.4 - 10.5 mg/dL    GFR calc non Af Amer  68 (*)  >90 mL/min    GFR calc Af Amer  78 (*)  >90 mL/min    Comment:      The eGFR has been calculated     using the CKD EPI equation.     This calculation has not been     validated in all clinical     situations.     eGFR's persistently     <90 mL/min signify     possible Chronic Kidney Disease.   CBC Status: Abnormal    Collection Time    03/21/13 4:55 AM   Result  Value  Range    WBC  10.1  4.0 - 10.5 K/uL    RBC  2.42 (*)  3.87 - 5.11 MIL/uL    Hemoglobin  7.2 (*)  12.0 - 15.0 g/dL    HCT  09.6 (*)  04.5 - 46.0 %    MCV  88.8  78.0 - 100.0 fL    MCH  29.8  26.0 - 34.0 pg    MCHC  33.5  30.0 - 36.0 g/dL    RDW  40.9  81.1 - 91.4 %    Platelets  204  150 - 400 K/uL   PREPARE RBC (CROSSMATCH) Status: None    Collection Time    03/21/13 8:00 AM   Result  Value  Range    Order Confirmation  ORDER PROCESSED BY BLOOD BANK    TYPE AND SCREEN Status: None    Collection Time    03/21/13 8:46 AM   Result  Value  Range     ABO/RH(D)  O POS     Antibody Screen  NEG     Sample Expiration  03/24/2013     Unit Number  [id number]     Blood Component Type  RED CELLS,LR     Unit division  00     Status of Unit  ISSUED     Transfusion Status  OK TO TRANSFUSE     Crossmatch Result  Compatible     Unit Number  [id number]     Blood Component Type  RED CELLS,LR     Unit division  00     Status of Unit  ISSUED     Transfusion Status  OK TO TRANSFUSE     Crossmatch Result  Compatible    PROTIME-INR Status: None    Collection Time    03/22/13 4:30 AM   Result  Value  Range    Prothrombin Time  14.4  11.6 - 15.2 seconds    INR  1.14  0.00 - 1.49   BASIC METABOLIC PANEL Status: Abnormal    Collection Time    03/22/13 4:30 AM   Result  Value  Range    Sodium  132 (*)  135 - 145 mEq/L    Potassium  4.3  3.5 - 5.1 mEq/L    Chloride  95 (*)  96 - 112 mEq/L    CO2  30  19 - 32 mEq/L    Glucose, Bld  111 (*)  70 - 99 mg/dL    BUN  13  6 - 23 mg/dL    Creatinine, Ser  3.24  0.50 - 1.10 mg/dL    Calcium  8.9  8.4 - 10.5 mg/dL    GFR calc non Af Amer  >90  >90 mL/min    GFR calc Af Amer  >90  >90 mL/min    Comment:      The eGFR has been calculated     using the CKD EPI equation.     This calculation has not been     validated in all clinical     situations.     eGFR's persistently     <90 mL/min signify     possible Chronic Kidney Disease.   CBC Status: Abnormal    Collection Time    03/22/13 4:30 AM   Result  Value  Range    WBC  12.1 (*)  4.0 - 10.5 K/uL    RBC  3.22 (*)  3.87 - 5.11 MIL/uL    Hemoglobin  9.4 (*)  12.0 - 15.0 g/dL    Comment:  DELTA CHECK NOTED     POST TRANSFUSION SPECIMEN    HCT  28.0 (*)  36.0 - 46.0 %    MCV  87.0  78.0 - 100.0 fL    MCH  29.2  26.0 - 34.0 pg    MCHC  33.6  30.0 - 36.0 g/dL    RDW  16.1  09.6 - 04.5 %    Platelets  227  150 - 400 K/uL    No results found.  Post Admission Physician Evaluation:  1. 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 2. Patient is admitted to receive collaborative, interdisciplinary care between the physiatrist, rehab nursing staff, and therapy team. 3. Patient's level of medical complexity and substantial therapy needs in context of that medical necessity cannot be provided at a lesser intensity of care such as a SNF. 4. Patient has experienced substantial functional loss from his/her baseline which was documented above under the "Functional History" and "Functional Status" headings. Judging by the patient's diagnosis, physical exam, and functional history, the patient has potential for functional progress which will result in measurable gains while on inpatient rehab. These gains will be of substantial and practical use upon discharge in facilitating mobility and self-care at the household level. 5. Physiatrist will provide 24 hour management of medical needs as well as oversight of the therapy plan/treatment and provide guidance as appropriate regarding the interaction of the two. 6. 24 hour rehab nursing will assist with bladder management, bowel management, safety, skin/wound care, disease management, medication administration, pain management and patient education and help integrate therapy concepts, techniques,education, etc. 7. PT will assess and treat for/with: Lower extremity strength, range of motion, stamina, balance, functional mobility, safety, adaptive techniques and equipment, knee rom, pain mgt, ortho precautions. Goals are: mod I. 8. OT will assess and treat for/with: ADL's, functional mobility, safety, upper extremity strength, adaptive techniques and equipment, pain mgt, ortho precautions. Goals are: mod I to set up. 9. SLP will assess and treat for/with: n/a. Goals are: n/a. 10. Case Management and Social Worker will assess and treat for psychological issues and discharge planning. 11. Team conference will be held weekly to assess progress toward goals and to determine  barriers to discharge. 12. Patient will receive at least 3 hours of therapy per day at least 5 days per week. 13. ELOS: 7-10 days  14. Prognosis: excellent   Medical Problem List and Plan:  1. Bilateral total knee arthroplasty 03/17/2013 secondary to end-stage degenerative joint  disease  2. DVT Prophylaxis/Anticoagulation: Coumadin for DVT prophylaxis. Continue Lovenox for INR greater than 2.00. Monitor for any bleeding episodes. Check vascular study  3. Pain Management: Hydrocodone/Robaxin as needed. Monitor with increased mobility  4. Mood/depression. Celexa 10 mg daily. Provide emotional support  5. Neuropsych: This patient is capable of making decisions on her own behalf.  6. Postoperative anemia. Patient has been transfused. Continue iron supplement. Followup CBC  7. Hypertension. Maxzide daily. Monitor the increased mobility  8. Hyperlipidemia. Lipitor  9. GERD. Protonix            Ranelle Oyster, MD, [facility] Physical Medicine & Rehabilitation   03/22/2013

## 2013-03-22 NOTE — Progress Notes (Signed)
Physical Therapy Treatment Patient Details Name: Jennifer Sanford MRN: [id number] DOB: Jan 23, 1951 Today's Date: 03/22/2013 Time: 4132-4401 PT Time Calculation (min): 44 min  PT Assessment / Plan / Recommendation  PT Comments     Follow Up Recommendations  CIR     Does the patient have the potential to tolerate intense rehabilitation     Barriers to Discharge        Equipment Recommendations  None recommended by PT    Recommendations for Other Services OT consult  Frequency 7X/week   Progress towards PT Goals Progress towards PT goals: Progressing toward goals  Plan Current plan remains appropriate    Precautions / Restrictions Precautions Precautions: Knee;Fall Required Braces or Orthoses: Knee Immobilizer - Right;Knee Immobilizer - Left Knee Immobilizer - Right: Discontinue once straight leg raise with < 10 degree lag (Pt performed IND SLR this am) Knee Immobilizer - Left: Discontinue once straight leg raise with < 10 degree lag Restrictions Weight Bearing Restrictions: No Other Position/Activity Restrictions: WBAT   Pertinent Vitals/Pain 6/10; premed    Mobility  Bed Mobility Bed Mobility: Supine to Sit Supine to Sit: 3: Mod assist Details for Bed Mobility Assistance: cues for sequence and use of UEs to self assist Transfers Transfers: Sit to Stand;Stand to Sit Sit to Stand: 3: Mod assist;From bed Stand to Sit: 3: Mod assist;To chair/3-in-1;With upper extremity assist Details for Transfer Assistance: cues and assist for use of UEs and management of LEs. Marked improvement in abilityt to pull LEs under while pushing up from bed Ambulation/Gait Ambulation/Gait Assistance: 1: +2 Total assist Ambulation/Gait: Patient Percentage: 80% Ambulation Distance (Feet): 21 Feet Assistive device: Rolling walker Ambulation/Gait Assistance Details: cues for posture, sequence and position from RW Gait Pattern: Step-to pattern;Trunk flexed;Decreased stride length Stairs: No     Exercises Total Joint Exercises Ankle Circles/Pumps: AROM;Supine;Both;20 reps Quad Sets: AROM;AAROM;Both;Supine;20 reps Heel Slides: AAROM;Supine;Both;10 reps (Pt continues with ++ muscle guarding.) Straight Leg Raises: AAROM;Both;Supine;20 reps;AROM   PT Diagnosis:    PT Problem List:   PT Treatment Interventions:     PT Goals (current goals can now be found in the care plan section) Acute Rehab PT Goals Patient Stated Goal: rehab then home  Visit Information  Last PT Received On: 03/22/13 Assistance Needed: +2    Subjective Data  Subjective: I really want to to go into the bathroom Patient Stated Goal: rehab then home   Cognition  Cognition Arousal/Alertness: Awake/alert Behavior During Therapy: [facility] for tasks assessed/performed Overall Cognitive Status: Within Functional Limits for tasks assessed    Balance     End of Session PT - End of Session Equipment Utilized During Treatment: Left knee immobilizer;Gait belt Activity Tolerance: Patient tolerated treatment well Patient left: Other (comment) (bathroom) Nurse Communication: Mobility status   GP     Rozalia Dino 03/22/2013, 12:14 PM

## 2013-03-22 NOTE — Progress Notes (Signed)
Patient to be admitted to inpt rehab today. Patient is aware and in agreement. RN is aware to arrange [facility] transport and to call report to inpt rehab. RN CM is aware.[phone]

## 2013-03-22 NOTE — Progress Notes (Signed)
Physical Therapy Treatment Patient Details Name: Jennifer Sanford MRN: [id number] DOB: December 01, 1950 Today's Date: 03/22/2013 Time: 0950-1001 PT Time Calculation (min): 11 min  PT Assessment / Plan / Recommendation  PT Comments     Follow Up Recommendations  CIR     Does the patient have the potential to tolerate intense rehabilitation     Barriers to Discharge        Equipment Recommendations  None recommended by PT    Recommendations for Other Services OT consult  Frequency 7X/week   Progress towards PT Goals Progress towards PT goals: Progressing toward goals  Plan Current plan remains appropriate    Precautions / Restrictions Precautions Precautions: Knee;Fall Required Braces or Orthoses: Knee Immobilizer - Right;Knee Immobilizer - Left Knee Immobilizer - Right: Discontinue once straight leg raise with < 10 degree lag Knee Immobilizer - Left: Discontinue once straight leg raise with < 10 degree lag Restrictions Weight Bearing Restrictions: No Other Position/Activity Restrictions: WBAT   Pertinent Vitals/Pain 5/10; premed, cold packs provided    Mobility  Bed Mobility Bed Mobility: Sit to Supine Supine to Sit: 3: Mod assist Sit to Supine: 3: Mod assist Details for Bed Mobility Assistance: cues for sequence and use of UEs to self assist Transfers Transfers: Sit to Stand;Stand to Sit Sit to Stand: 3: Mod assist;From chair/3-in-1 Stand to Sit: 3: Mod assist;With upper extremity assist;To bed Details for Transfer Assistance: cues and assist for use of UEs and management of LEs. Marked improvement in abilityt to pull LEs under while pushing up from bed Ambulation/Gait Ambulation/Gait Assistance: 1: +2 Total assist Ambulation/Gait: Patient Percentage: 80% Ambulation Distance (Feet): 21 Feet Assistive device: Rolling walker Ambulation/Gait Assistance Details: cues for sequence, posture and position from RW Gait Pattern: Step-to pattern;Trunk flexed;Decreased stride  length Stairs: No    Exercises Total Joint Exercises Ankle Circles/Pumps: AROM;Supine;Both;20 reps Quad Sets: AROM;AAROM;Both;Supine;20 reps Heel Slides: AAROM;Supine;Both;10 reps (Pt continues with ++ muscle guarding.) Straight Leg Raises: AAROM;Both;Supine;20 reps;AROM   PT Diagnosis:    PT Problem List:   PT Treatment Interventions:     PT Goals (current goals can now be found in the care plan section) Acute Rehab PT Goals Patient Stated Goal: rehab then home  Visit Information  Last PT Received On: 03/22/13 Assistance Needed: +1    Subjective Data  Subjective: I really want to to go into the bathroom Patient Stated Goal: rehab then home   Cognition  Cognition Arousal/Alertness: Awake/alert Behavior During Therapy: [facility] for tasks assessed/performed Overall Cognitive Status: Within Functional Limits for tasks assessed    Balance     End of Session PT - End of Session Equipment Utilized During Treatment: Left knee immobilizer;Gait belt Activity Tolerance: Patient tolerated treatment well Patient left: in bed;with call bell/phone within reach;with family/visitor present Nurse Communication: Mobility status   GP     Craige Patel 03/22/2013, 12:19 PM

## 2013-03-22 NOTE — Progress Notes (Signed)
Orthopedic Tech Progress Note Patient Details:  Jennifer Sanford [birth year] [id number] CPM applied to Right LE with appropriate settings. Tolerated well. Patient is bilateral knee. CPM Left Knee CPM Left Knee: Off Left Knee Flexion (Degrees): 50 Left Knee Extension (Degrees): 0 CPM Right Knee CPM Right Knee: On Right Knee Flexion (Degrees): 50 Right Knee Extension (Degrees): 0   Asia R Thompson 03/22/2013, 2:47 PM

## 2013-03-22 NOTE — Progress Notes (Signed)
Discharge summary sent to payer through [facility]  

## 2013-03-22 NOTE — Plan of Care (Signed)
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  Co-morbidities: Morbid obesity, pain management  Functional Problem List  Patient demonstrates impairments in the following areas: Balance, Endurance, Medication Management, Motor, Pain and Skin Integrity  Basic ADL's: bathing, dressing and toileting Advanced ADL's: simple meal preparation  Transfers:  bed mobility, bed to chair, toilet, tub/shower, car and furniture Locomotion:  ambulation and stairs  Additional Impairments:  None  Anticipated Outcomes Item Anticipated Outcome  Eating/Swallowing    Basic self-care  Mod I  Tolieting  Mod I  Bowel/Bladder  Pt continent of bowel and bladder  Transfers  Supervision  Locomotion  Supervision with RW   Communication    Cognition    Pain  Pt will rate pain less than 3 on scale of 0-10  Safety/Judgment  Pt will remain free from falls during admission  Other  Incisions to bilateral knees will heal with out signs and symptoms of infection   Therapy Plan: PT Intensity: Minimum of 1-2 x/day ,45 to 90 minutes PT Frequency: 5 out of 7 days PT Duration Estimated Length of Stay: 2 weeks  OT Intensity: Minimum of 1-2 x/day, 45 to 90 minutes OT Frequency: 5 out of 7 days OT Duration/Estimated Length of Stay: 2 weeks      Team Interventions: Item RN PT OT SLP SW TR Other  Self Care/Advanced ADL Retraining  x x      Neuromuscular Re-Education  x       Therapeutic Activities  x x      UE/LE Strength Training/ROM  x x      UE/LE Coordination Activities         Visual/Perceptual Remediation/Compensation         DME/Adaptive Equipment Instruction  x x      Therapeutic Exercise  x x      Balance/Vestibular Training  x x      Patient/Family Education  x x      Cognitive Remediation/Compensation         Functional Mobility Training  x x       Ambulation/Gait Training  x       Clinical research associate  x       Community Reintegration  x       Dysphagia/Aspiration Film/video editor         Bladder Management         Bowel Management         Disease Management/Prevention         Pain Management x x       Medication Management x        Skin Care/Wound Management x        Splinting/Orthotics  x       Discharge Planning  x x      Psychosocial Support x x x                             Team Discharge Planning: Destination: PT-Home ,OT- Home , SLP-  Projected Follow-up: PT-Outpatient PT;24 hour supervision/assistance;Home health PT pending progress, OT-  None, SLP-  Projected Equipment Needs: PT-None recommended by PT, OT- 3 in 1 bedside comode;Tub/shower seat,  SLP-  Patient/family involved in discharge planning: PT- Patient,  OT-Patient;Family member/caregiver, SLP-   MD ELOS: 7-10 days Medical Rehab Prognosis:  Good Assessment: 62 year old female with end-stage osteoarthritis of both knees underwent bilateral TK R.  Now requiring 24/7 Rehab RN,MD, as well as CIR level PT, OT and SLP.  Treatment team will focus on ADLs and mobility with goals set at Sup/Mod I    See Team Conference Notes for weekly updates to the plan of care

## 2013-03-22 NOTE — Progress Notes (Signed)
ANTICOAGULATION CONSULT NOTE - Follow up  Pharmacy Consult for warfarin Indication: s/p bilateral TKA  No Known Allergies  Patient Measurements: Height: 5\' 11"  (180.3 cm) Weight: 224 lb (101.606 kg) IBW/kg (Calculated) : 70.8  Vital Signs: Temp: 98.1 F (36.7 C) (06/30 1041) Temp src: Oral (06/30 1041) BP: 118/80 mmHg (06/30 1041) Pulse Rate: 96 (06/30 1041)  Labs:  Recent Labs  03/20/13 0600 03/21/13 0455 03/22/13 0430  HGB 7.8* 7.2* 9.4*  HCT 23.8* 21.5* 28.0*  PLT 170 204 227  LABPROT 14.5 14.2 14.4  INR 1.15 1.12 1.14  CREATININE  --  0.91 0.76   Medications:  Scheduled:  . atorvastatin  40 mg Oral q1800  . citalopram  10 mg Oral q morning - 10a  . docusate sodium  100 mg Oral BID  . enoxaparin (LOVENOX) injection  30 mg Subcutaneous Q12H  . iron polysaccharides  150 mg Oral Daily  . pantoprazole  40 mg Oral Daily  . polyethylene glycol  17 g Oral Daily  . triamterene-hydrochlorothiazide  0.5 tablet Oral q morning - 10a  . warfarin   Does not apply Once  . Warfarin - Pharmacist Dosing Inpatient   Does not apply q1800   Assessment: 62 yo s/p Bilateral TKA on 6/25 with epidural in for pain control.  Lovenox for DVT ppx started at appropriate time after pulling epidural in combo with warfarin until INR therapeutic. Warfarin to start 6/26.  Epidural was removed 6/27 at 10:22 AM w/o complications.  Lovenox ppx dose started 6/27 at 2300.  CBC:  Hgb improved to 9.4 following PRBC transfusion yesterday.  No bleeding or complications reported.  Follow.  INR = 1.14, remains subtherapeutic.  Doses provided thus far: 2.5, 5, 7.5, 10 mg.   Warfarin education completed 6/28 pm with teach-back.  Goal of Therapy:  INR 2-3   Plan:  For planned discharge today, recommend discharging patient on warfarin 10 mg daily with INR f/u in 2 days for dosing adjustment.     Clance Boll, PharmD, BCPS Pager: [phone] 03/22/2013 11:16 AM

## 2013-03-22 NOTE — Progress Notes (Signed)
Pt admitted to [id number], pt alert and oriented, pt and family oriented to room, rehab schedule, safety plan and call bell. Pt verbalized an understanding, pt resting in bed with call bell in reach

## 2013-03-22 NOTE — Discharge Summary (Signed)
Physician Discharge Summary   Patient ID: Jennifer Sanford MRN: [id number] DOB/AGE: 03/05/1951 62 y.o.  Admit date: 03/17/2013 Discharge date: 03/22/2013  Primary Diagnosis:  Osteoarthritis Bilateral knee(s)  Admission Diagnoses:  Past Medical History  Diagnosis Date  . Hypertension   . Hypercholesteremia   . Arthritis   . Depression   . GERD (gastroesophageal reflux disease)    Discharge Diagnoses:   Principal Problem:   OA (osteoarthritis) of knee Active Problems:   Postoperative anemia due to acute blood loss   Hyponatremia   Postop Transfusion  Estimated body mass index is 31.26 kg/(m^2) as calculated from the following:   Height as of this encounter: 5\' 11"  (1.803 m).   Weight as of this encounter: 101.606 kg (224 lb).  Procedure:  Procedure(s) (LRB): TOTAL KNEE BILATERAL (Bilateral)   Consults: [facility]  HPI: Jennifer Sanford is a 62 y.o. year old female with end stage OA of both knees with progressively worsening pain and dysfunction. She has constant pain, with activity and at rest and significant functional deficits with difficulties even with ADLs. She has had extensive non-op management including analgesics, injections of cortisone and viscosupplements, and home exercise program, but remains in significant pain with significant dysfunction. Radiographs show bone on bone arthritis medial and patellofemoral compartments both knees. We discussed doing bilateral TKAs in the same setting versus staging the knees one at a time and the procedure, risks and potential complications associated with each. She presents now for bilateral Total Knee Arthroplasty.   Laboratory Data: Admission on 03/17/2013  Component Date Value Range Status  . ABO/RH(D) 03/17/2013 O POS   Final  . Antibody Screen 03/17/2013 NEG   Final  . Sample Expiration 03/17/2013 03/20/2013   Final  . ABO/RH(D) 03/17/2013 O POS   Final  . WBC 03/18/2013 7.2  4.0 - 10.5 K/uL Final  . RBC  03/18/2013 3.30* 3.87 - 5.11 MIL/uL Final  . Hemoglobin 03/18/2013 9.7* 12.0 - 15.0 g/dL Final  . HCT 40/98/1191 29.7* 36.0 - 46.0 % Final  . MCV 03/18/2013 90.0  78.0 - 100.0 fL Final  . MCH 03/18/2013 29.4  26.0 - 34.0 pg Final  . MCHC 03/18/2013 32.7  30.0 - 36.0 g/dL Final  . RDW 47/82/9562 13.7  11.5 - 15.5 % Final  . Platelets 03/18/2013 183  150 - 400 K/uL Final  . Sodium 03/18/2013 134* 135 - 145 mEq/L Final  . Potassium 03/18/2013 3.7  3.5 - 5.1 mEq/L Final  . Chloride 03/18/2013 101  96 - 112 mEq/L Final  . CO2 03/18/2013 26  19 - 32 mEq/L Final  . Glucose, Bld 03/18/2013 120* 70 - 99 mg/dL Final  . BUN 13/04/6577 11  6 - 23 mg/dL Final  . Creatinine, Ser 03/18/2013 0.74  0.50 - 1.10 mg/dL Final  . Calcium 46/96/2952 8.1* 8.4 - 10.5 mg/dL Final  . GFR calc non Af Amer 03/18/2013 >90  >90 mL/min Final  . GFR calc Af Amer 03/18/2013 >90  >90 mL/min Final   Comment:                                 The eGFR has been calculated                          using the CKD EPI equation.  This calculation has not been                          validated in all clinical                          situations.                          eGFR's persistently                          <90 mL/min signify                          possible Chronic Kidney Disease.  Marland Kitchen Prothrombin Time 03/18/2013 13.7  11.6 - 15.2 seconds Final  . INR 03/18/2013 1.07  0.00 - 1.49 Final  . WBC 03/19/2013 14.1* 4.0 - 10.5 K/uL Final  . RBC 03/19/2013 3.11* 3.87 - 5.11 MIL/uL Final  . Hemoglobin 03/19/2013 9.2* 12.0 - 15.0 g/dL Final  . HCT 56/21/3086 28.3* 36.0 - 46.0 % Final  . MCV 03/19/2013 91.0  78.0 - 100.0 fL Final  . MCH 03/19/2013 29.6  26.0 - 34.0 pg Final  . MCHC 03/19/2013 32.5  30.0 - 36.0 g/dL Final  . RDW 57/84/6962 13.9  11.5 - 15.5 % Final  . Platelets 03/19/2013 188  150 - 400 K/uL Final  . Sodium 03/19/2013 133* 135 - 145 mEq/L Final  . Potassium 03/19/2013 4.3  3.5 - 5.1  mEq/L Final  . Chloride 03/19/2013 99  96 - 112 mEq/L Final  . CO2 03/19/2013 28  19 - 32 mEq/L Final  . Glucose, Bld 03/19/2013 143* 70 - 99 mg/dL Final  . BUN 95/28/4132 11  6 - 23 mg/dL Final  . Creatinine, Ser 03/19/2013 0.95  0.50 - 1.10 mg/dL Final  . Calcium 44/09/270 9.0  8.4 - 10.5 mg/dL Final  . GFR calc non Af Amer 03/19/2013 64* >90 mL/min Final  . GFR calc Af Amer 03/19/2013 75* >90 mL/min Final   Comment:                                 The eGFR has been calculated                          using the CKD EPI equation.                          This calculation has not been                          validated in all clinical                          situations.                          eGFR's persistently                          <90 mL/min signify  possible Chronic Kidney Disease.  Marland Kitchen Prothrombin Time 03/19/2013 15.6* 11.6 - 15.2 seconds Final  . INR 03/19/2013 1.27  0.00 - 1.49 Final  . WBC 03/20/2013 11.0* 4.0 - 10.5 K/uL Final  . RBC 03/20/2013 2.64* 3.87 - 5.11 MIL/uL Final  . Hemoglobin 03/20/2013 7.8* 12.0 - 15.0 g/dL Final  . HCT 45/40/9811 23.8* 36.0 - 46.0 % Final  . MCV 03/20/2013 90.2  78.0 - 100.0 fL Final  . MCH 03/20/2013 29.5  26.0 - 34.0 pg Final  . MCHC 03/20/2013 32.8  30.0 - 36.0 g/dL Final  . RDW 91/47/8295 13.9  11.5 - 15.5 % Final  . Platelets 03/20/2013 170  150 - 400 K/uL Final  . Prothrombin Time 03/20/2013 14.5  11.6 - 15.2 seconds Final  . INR 03/20/2013 1.15  0.00 - 1.49 Final  . Prothrombin Time 03/21/2013 14.2  11.6 - 15.2 seconds Final  . INR 03/21/2013 1.12  0.00 - 1.49 Final  . Sodium 03/21/2013 135  135 - 145 mEq/L Final  . Potassium 03/21/2013 3.9  3.5 - 5.1 mEq/L Final  . Chloride 03/21/2013 99  96 - 112 mEq/L Final  . CO2 03/21/2013 28  19 - 32 mEq/L Final  . Glucose, Bld 03/21/2013 114* 70 - 99 mg/dL Final  . BUN 62/13/0865 16  6 - 23 mg/dL Final  . Creatinine, Ser 03/21/2013 0.91  0.50 - 1.10 mg/dL Final    . Calcium 78/46/9629 8.8  8.4 - 10.5 mg/dL Final  . GFR calc non Af Amer 03/21/2013 68* >90 mL/min Final  . GFR calc Af Amer 03/21/2013 78* >90 mL/min Final   Comment:                                 The eGFR has been calculated                          using the CKD EPI equation.                          This calculation has not been                          validated in all clinical                          situations.                          eGFR's persistently                          <90 mL/min signify                          possible Chronic Kidney Disease.  . WBC 03/21/2013 10.1  4.0 - 10.5 K/uL Final  . RBC 03/21/2013 2.42* 3.87 - 5.11 MIL/uL Final  . Hemoglobin 03/21/2013 7.2* 12.0 - 15.0 g/dL Final  . HCT 52/84/1324 21.5* 36.0 - 46.0 % Final  . MCV 03/21/2013 88.8  78.0 - 100.0 fL Final  . MCH 03/21/2013 29.8  26.0 - 34.0 pg Final  . MCHC 03/21/2013 33.5  30.0 - 36.0 g/dL Final  . RDW 40/06/2724 13.9  11.5 - 15.5 %  Final  . Platelets 03/21/2013 204  150 - 400 K/uL Final  . ABO/RH(D) 03/21/2013 O POS   Final  . Antibody Screen 03/21/2013 NEG   Final  . Sample Expiration 03/21/2013 03/24/2013   Final  . Unit Number 03/21/2013 [id number]   Final  . Blood Component Type 03/21/2013 RED CELLS,LR   Final  . Unit division 03/21/2013 00   Final  . Status of Unit 03/21/2013 ISSUED   Final  . Transfusion Status 03/21/2013 OK TO TRANSFUSE   Final  . Crossmatch Result 03/21/2013 Compatible   Final  . Unit Number 03/21/2013 [id number]   Final  . Blood Component Type 03/21/2013 RED CELLS,LR   Final  . Unit division 03/21/2013 00   Final  . Status of Unit 03/21/2013 ISSUED   Final  . Transfusion Status 03/21/2013 OK TO TRANSFUSE   Final  . Crossmatch Result 03/21/2013 Compatible   Final  . Order Confirmation 03/21/2013 ORDER PROCESSED BY BLOOD BANK   Final  . Prothrombin Time 03/22/2013 14.4  11.6 - 15.2 seconds Final  . INR 03/22/2013 1.14  0.00 - 1.49 Final  . Sodium  03/22/2013 132* 135 - 145 mEq/L Final  . Potassium 03/22/2013 4.3  3.5 - 5.1 mEq/L Final  . Chloride 03/22/2013 95* 96 - 112 mEq/L Final  . CO2 03/22/2013 30  19 - 32 mEq/L Final  . Glucose, Bld 03/22/2013 111* 70 - 99 mg/dL Final  . BUN 30/86/5784 13  6 - 23 mg/dL Final  . Creatinine, Ser 03/22/2013 0.76  0.50 - 1.10 mg/dL Final  . Calcium 69/62/9528 8.9  8.4 - 10.5 mg/dL Final  . GFR calc non Af Amer 03/22/2013 >90  >90 mL/min Final  . GFR calc Af Amer 03/22/2013 >90  >90 mL/min Final   Comment:                                 The eGFR has been calculated                          using the CKD EPI equation.                          This calculation has not been                          validated in all clinical                          situations.                          eGFR's persistently                          <90 mL/min signify                          possible Chronic Kidney Disease.  . WBC 03/22/2013 12.1* 4.0 - 10.5 K/uL Final  . RBC 03/22/2013 3.22* 3.87 - 5.11 MIL/uL Final  . Hemoglobin 03/22/2013 9.4* 12.0 - 15.0 g/dL Final   Comment: DELTA CHECK NOTED  POST TRANSFUSION SPECIMEN  . HCT 03/22/2013 28.0* 36.0 - 46.0 % Final  . MCV 03/22/2013 87.0  78.0 - 100.0 fL Final  . MCH 03/22/2013 29.2  26.0 - 34.0 pg Final  . MCHC 03/22/2013 33.6  30.0 - 36.0 g/dL Final  . RDW 16/06/9603 14.7  11.5 - 15.5 % Final  . Platelets 03/22/2013 227  150 - 400 K/uL Final  Hospital Outpatient Visit on 03/12/2013  Component Date Value Range Status  . MRSA, PCR 03/12/2013 NEGATIVE  NEGATIVE Final  . Staphylococcus aureus 03/12/2013 NEGATIVE  NEGATIVE Final   Comment:                                 The Xpert SA Assay (FDA                          approved for NASAL specimens                          in patients over 33 years of age),                          is one component of                          a comprehensive surveillance                           program.  Test performance has                          been validated by [employer] for patients greater                          than or equal to 61 year old.                          It is not intended                          to diagnose infection nor to                          guide or monitor treatment.  Marland Kitchen aPTT 03/12/2013 29  24 - 37 seconds Final  . WBC 03/12/2013 7.2  4.0 - 10.5 K/uL Final  . RBC 03/12/2013 4.24  3.87 - 5.11 MIL/uL Final  . Hemoglobin 03/12/2013 12.6  12.0 - 15.0 g/dL Final  . HCT 54/05/8118 38.4  36.0 - 46.0 % Final  . MCV 03/12/2013 90.6  78.0 - 100.0 fL Final  . MCH 03/12/2013 29.7  26.0 - 34.0 pg Final  . MCHC 03/12/2013 32.8  30.0 - 36.0 g/dL Final  . RDW 14/78/2956 14.2  11.5 - 15.5 % Final  . Platelets 03/12/2013 271  150 - 400 K/uL Final  . Sodium 03/12/2013 140  135 - 145 mEq/L Final  . Potassium 03/12/2013 5.0  3.5 - 5.1 mEq/L Final  . Chloride  03/12/2013 103  96 - 112 mEq/L Final  . CO2 03/12/2013 31  19 - 32 mEq/L Final  . Glucose, Bld 03/12/2013 83  70 - 99 mg/dL Final  . BUN 16/06/9603 25* 6 - 23 mg/dL Final  . Creatinine, Ser 03/12/2013 0.92  0.50 - 1.10 mg/dL Final  . Calcium 54/05/8118 9.9  8.4 - 10.5 mg/dL Final  . Total Protein 03/12/2013 7.5  6.0 - 8.3 g/dL Final  . Albumin 14/78/2956 3.5  3.5 - 5.2 g/dL Final  . AST 21/30/8657 22  0 - 37 U/L Final  . ALT 03/12/2013 23  0 - 35 U/L Final  . Alkaline Phosphatase 03/12/2013 77  39 - 117 U/L Final  . Total Bilirubin 03/12/2013 0.3  0.3 - 1.2 mg/dL Final  . GFR calc non Af Amer 03/12/2013 67* >90 mL/min Final  . GFR calc Af Amer 03/12/2013 77* >90 mL/min Final   Comment:                                 The eGFR has been calculated                          using the CKD EPI equation.                          This calculation has not been                          validated in all clinical                          situations.                          eGFR's  persistently                          <90 mL/min signify                          possible Chronic Kidney Disease.  Marland Kitchen Prothrombin Time 03/12/2013 12.6  11.6 - 15.2 seconds Final  . INR 03/12/2013 0.95  0.00 - 1.49 Final  . Color, Urine 03/12/2013 YELLOW  YELLOW Final  . APPearance 03/12/2013 CLEAR  CLEAR Final  . Specific Gravity, Urine 03/12/2013 1.023  1.005 - 1.030 Final  . pH 03/12/2013 6.5  5.0 - 8.0 Final  . Glucose, UA 03/12/2013 NEGATIVE  NEGATIVE mg/dL Final  . Hgb urine dipstick 03/12/2013 NEGATIVE  NEGATIVE Final  . Bilirubin Urine 03/12/2013 NEGATIVE  NEGATIVE Final  . Ketones, ur 03/12/2013 NEGATIVE  NEGATIVE mg/dL Final  . Protein, ur 84/69/6295 NEGATIVE  NEGATIVE mg/dL Final  . Urobilinogen, UA 03/12/2013 0.2  0.0 - 1.0 mg/dL Final  . Nitrite 28/41/3244 NEGATIVE  NEGATIVE Final  . Leukocytes, UA 03/12/2013 SMALL* NEGATIVE Final  . WBC, UA 03/12/2013 0-2  <3 WBC/hpf Final     X-Rays:Dg Chest 2 View  03/12/2013   *RADIOLOGY REPORT*  Clinical Data: Preop  CHEST - 2 VIEW  Comparison: None.  Findings: Normal mediastinum and cardiac silhouette.  Normal pulmonary  vasculature.  No evidence of effusion, infiltrate, or pneumothorax.  No acute bony abnormality.  Degenerative osteophytosis of the  thoracic spine.  IMPRESSION: No acute cardiopulmonary process.   Original Report Authenticated By: Genevive Bi, M.D.    EKG: Orders placed in visit on 03/12/13  . EKG 12-LEAD     Hospital Course: Patient was admitted to [facility] and taken to the OR and underwent the above stated procedure well without complications.  Patient tolerated the procedure well and was later transferred to the recovery room and then to the orthopaedic floor for postoperative care. Anesthesia was consulted postoperatively to place an epidural in for postoperative pain management. The patient was also given PO and IV analgesics for pain control following their surgery.  They were given 24 hours of  postoperative antibiotics and started on DVT prophylaxis in the form of Lovenox and Coumadin after the epidural had been removed.   PT and OT were ordered for total joint protocol.  Discharge planning consulted to help with postop disposition and equipment needs.  Patient wanted to look into [facility].  Patient had a rough night on the evening of surgery and started to get up OOB with therapy on day one. Hemovac drains were pulled without difficulty on day one.  Epidural came out on POD 2. Continued to work with therapy into day two.  Dressings were changed on day two and both incisions were healing well.  The epidural was removed without difficulty by Anesthesia on day two.  By day three, the patient started to show progress with therapy despite low HGB of 7.8.  They continued to receive therapy each day for continued total knee protocol.  The incisions were healing well.  They continued to progress on day four but did require blood since the HGB went down further to 7.2. Seen on day five at which time the patient was seen in rounds and was ready to go to [facility].  HGB back up top 9.4 after tow units.  Discharge Medications: Prior to Admission medications   Medication Sig Start Date End Date Taking? Authorizing Provider  citalopram (CELEXA) 10 MG tablet Take 10 mg by mouth every morning.    Yes Historical Provider, MD  pantoprazole (PROTONIX) 40 MG tablet Take 40 mg by mouth daily.   Yes Historical Provider, MD  rosuvastatin (CRESTOR) 20 MG tablet Take 20 mg by mouth every morning.    Yes Historical Provider, MD  triamterene-hydrochlorothiazide (MAXZIDE-25) 37.5-25 MG per tablet Take 0.5 tablets by mouth every morning.    Yes Historical Provider, MD  bisacodyl (DULCOLAX) 10 MG suppository Place 1 suppository (10 mg total) rectally daily as needed. 03/22/13   Alexzandrew Perkins, PA-C  celecoxib (CELEBREX) 400 MG capsule Take 400 mg by mouth daily.    Historical Provider, MD    diphenhydrAMINE (BENADRYL) 12.5 MG/5ML elixir Take 5-10 mLs (12.5-25 mg total) by mouth every 4 (four) hours as needed for itching. 03/22/13   Alexzandrew Perkins, PA-C  docusate sodium 100 MG CAPS Take 100 mg by mouth 2 (two) times daily. 03/22/13   Alexzandrew Julien Girt, PA-C  HYDROcodone-acetaminophen (NORCO/VICODIN) 5-325 MG per tablet Take 1-2 tablets by mouth every 4 (four) hours as needed. 03/22/13   Alexzandrew Perkins, PA-C  iron polysaccharides (NIFEREX) 150 MG capsule Take 1 capsule (150 mg total) by mouth daily. 03/22/13   Alexzandrew Perkins, PA-C  methocarbamol (ROBAXIN) 500 MG tablet Take 1 tablet (500 mg total) by mouth every 6 (six) hours as needed. 03/22/13   Alexzandrew Julien Girt, PA-C  metoCLOPramide (REGLAN) 5 MG tablet Take 1-2 tablets (5-10 mg total) by  mouth every 8 (eight) hours as needed (if ondansetron (ZOFRAN) ineffective.). 03/22/13   Alexzandrew Perkins, PA-C  ondansetron (ZOFRAN) 4 MG tablet Take 1 tablet (4 mg total) by mouth every 6 (six) hours as needed for nausea. 03/22/13   Alexzandrew Perkins, PA-C  polyethylene glycol (MIRALAX / GLYCOLAX) packet Take 17 g by mouth daily as needed. 03/22/13   Alexzandrew Perkins, PA-C  traMADol (ULTRAM) 50 MG tablet Take 1-2 tablets (50-100 mg total) by mouth every 6 (six) hours as needed (mild pain). 03/22/13   Alexzandrew Julien Girt, PA-C  Warfarin - Pharmacist Dosing Inpatient MISC 5 each by Does not apply route daily at 6 PM. Take Coumadin for four weeks and then discontinue.  The dose may need to be adjusted based upon the INR.  Please follow the INR and titrate Coumadin dose for a therapeutic range between 2.0 and 3.0 INR.  After completing the four weeks of Coumadin, the patient should take an 81 mg Aspirin daily for four more weeks. 03/22/13   Alexzandrew Perkins, PA-C    Take Coumadin for 4 weeks and then discontinue.  The dose may need to be adjusted based upon the INR.  Please follow the INR and titrate Coumadin dose for a therapeutic  range between 2.0 and 3.0 INR.  After completing the 4 weeks of Coumadin, the patient may stop the Coumadin and then take an 81 mg Aspirin daily.  Continue Lovenox injections until the INR is therapeutic at or greater than 2.0.  When INR reaches the therapeutic level of equal to or greater than 2.0, the patient may discontinue the Lovenox injections.   Diet: Cardiac diet Activity:WBAT Follow-up:in 2 weeks or after discharge form Rehab, Disposition - Rehab Discharged Condition: good   Discharge Orders   Future Orders Complete By Expires     Call MD / Call 911  As directed     Comments:      If you experience chest pain or shortness of breath, CALL 911 and be transported to the hospital emergency room.  If you develope a fever above 101 F, pus (white drainage) or increased drainage or redness at the wound, or calf pain, call your surgeon's office.    Change dressing  As directed     Comments:      Change dressing daily with sterile 4 x 4 inch gauze dressing and apply TED hose. Do not submerge the incision under water.    Constipation Prevention  As directed     Comments:      Drink plenty of fluids.  Prune juice may be helpful.  You may use a stool softener, such as Colace (over the counter) 100 mg twice a day.  Use MiraLax (over the counter) for constipation as needed.    Diet - low sodium heart healthy  As directed     Discharge instructions  As directed     Comments:      Pick up stool softner and laxative for home. Do not submerge incision under water. May shower. Continue to use ice for pain and swelling from surgery.  Take Coumadin for four weeks and then discontinue.  The dose may need to be adjusted based upon the INR.  Please follow the INR and titrate Coumadin dose for a therapeutic range between 2.0 and 3.0 INR.  After completing the four weeks of Coumadin, the patient should take an 81 mg Aspirin daily.  When discharged from the skilled rehab facility, please have the  facility set up the  patient's Home Health Physical Therapy prior to being released.  Also provide the patient with their medications at time of release from the facility to include their pain medication, the muscle relaxants, and their blood thinner medication.  If the patient is still at the rehab facility at time of follow up appointment, please also assist the patient in arranging follow up appointment in our office and any transportation needs.    Do not put a pillow under the knee. Place it under the heel.  As directed     Do not sit on low chairs, stoools or toilet seats, as it may be difficult to get up from low surfaces  As directed     Driving restrictions  As directed     Comments:      No driving until released by the physician.    Increase activity slowly as tolerated  As directed     Lifting restrictions  As directed     Comments:      No lifting until released by the physician.    Patient may shower  As directed     Comments:      You may shower without a dressing once there is no drainage.  Do not wash over the wound.  If drainage remains, do not shower until drainage stops.    TED hose  As directed     Comments:      Use stockings (TED hose) for 3 weeks on both leg(s).  You may remove them at night for sleeping.    Weight bearing as tolerated  As directed         Medication List    STOP taking these medications       fish oil-omega-3 fatty acids 1000 MG capsule     OVER THE COUNTER MEDICATION     vitamin B-12 1000 MCG tablet  Commonly known as:  CYANOCOBALAMIN      TAKE these medications       bisacodyl 10 MG suppository  Commonly known as:  DULCOLAX  Place 1 suppository (10 mg total) rectally daily as needed.     celecoxib 400 MG capsule  Commonly known as:  CELEBREX  Take 400 mg by mouth daily.     citalopram 10 MG tablet  Commonly known as:  CELEXA  Take 10 mg by mouth every morning.     diphenhydrAMINE 12.5 MG/5ML elixir  Commonly known as:  BENADRYL   Take 5-10 mLs (12.5-25 mg total) by mouth every 4 (four) hours as needed for itching.     DSS 100 MG Caps  Take 100 mg by mouth 2 (two) times daily.     HYDROcodone-acetaminophen 5-325 MG per tablet  Commonly known as:  NORCO/VICODIN  Take 1-2 tablets by mouth every 4 (four) hours as needed.     iron polysaccharides 150 MG capsule  Commonly known as:  NIFEREX  Take 1 capsule (150 mg total) by mouth daily.     methocarbamol 500 MG tablet  Commonly known as:  ROBAXIN  Take 1 tablet (500 mg total) by mouth every 6 (six) hours as needed.     metoCLOPramide 5 MG tablet  Commonly known as:  REGLAN  Take 1-2 tablets (5-10 mg total) by mouth every 8 (eight) hours as needed (if ondansetron (ZOFRAN) ineffective.).     ondansetron 4 MG tablet  Commonly known as:  ZOFRAN  Take 1 tablet (4 mg total) by mouth every 6 (six) hours as needed for nausea.  pantoprazole 40 MG tablet  Commonly known as:  PROTONIX  Take 40 mg by mouth daily.     polyethylene glycol packet  Commonly known as:  MIRALAX / GLYCOLAX  Take 17 g by mouth daily as needed.     rosuvastatin 20 MG tablet  Commonly known as:  CRESTOR  Take 20 mg by mouth every morning.     traMADol 50 MG tablet  Commonly known as:  ULTRAM  Take 1-2 tablets (50-100 mg total) by mouth every 6 (six) hours as needed (mild pain).     triamterene-hydrochlorothiazide 37.5-25 MG per tablet  Commonly known as:  MAXZIDE-25  Take 0.5 tablets by mouth every morning.     Warfarin - Pharmacist Dosing Inpatient Misc  5 each by Does not apply route daily at 6 PM. Take Coumadin for four weeks and then discontinue.  The dose may need to be adjusted based upon the INR.  Please follow the INR and titrate Coumadin dose for a therapeutic range between 2.0 and 3.0 INR.  After completing the four weeks of Coumadin, the patient should take an 81 mg Aspirin daily for four more weeks.           Follow-up Information   Follow up with Loanne Drilling,  MD. Schedule an appointment as soon as possible for a visit in 2 weeks.   Contact information:   [address], [address] [place] [address] [phone]       Signed: Patrica Duel 03/22/2013, 7:15 AM

## 2013-03-22 NOTE — Progress Notes (Signed)
ANTICOAGULATION CONSULT NOTE - Initial Consult  Pharmacy Consult for coumadin Indication: VTE prophylaxis  No Known Allergies  Patient Measurements: Weight: 238 lb 12.1 oz (108.3 kg) Heparin Dosing Weight:   Vital Signs: Temp: 98.6 F (37 C) (06/30 1258) Temp src: Oral (06/30 1258) BP: 111/70 mmHg (06/30 1258) Pulse Rate: 91 (06/30 1258)  Labs:  Recent Labs  03/20/13 0600 03/21/13 0455 03/22/13 0430  HGB 7.8* 7.2* 9.4*  HCT 23.8* 21.5* 28.0*  PLT 170 204 227  LABPROT 14.5 14.2 14.4  INR 1.15 1.12 1.14  CREATININE  --  0.91 0.76    The CrCl is unknown because both a height and weight (above a minimum accepted value) are required for this calculation.   Medical History: Past Medical History  Diagnosis Date  . Hypertension   . Hypercholesteremia   . Arthritis   . Depression   . GERD (gastroesophageal reflux disease)     Medications:  Scheduled:  . atorvastatin  40 mg Oral q1800  . [START ON 03/23/2013] citalopram  10 mg Oral q morning - 10a  . enoxaparin (LOVENOX) injection  30 mg Subcutaneous Q12H  . iron polysaccharides  150 mg Oral Daily  . pantoprazole  40 mg Oral Daily  . polyethylene glycol  17 g Oral Daily  . senna-docusate  2 tablet Oral QHS  . [START ON 03/23/2013] triamterene-hydrochlorothiazide  0.5 tablet Oral q morning - 10a  . warfarin  10 mg Oral ONCE-1800  . Warfarin - Pharmacist Dosing Inpatient   Does not apply q1800   Infusions:    Assessment: Anticoag: 62 yo s/p Bilateral TKA on 6/25 with epidural in for pain control. Lovenox for DVT ppx started at appropriate time after pulling epidural in combo with warfarin until INR therapeutic. Warfarin started 6/26.  Epidural was removed 6/27 at 10:22 AM w/o complications. Lovenox ppx dose started 6/27 at 2300. CBC: Hgb improved to 9.4 on 06/30 following PRBC transfusion yesterday. No bleeding or complications reported. Follow.  INR = 1.14, remains subtherapeutic. Doses provided thus far: 2.5, 5, 7.5,  10 mg.  Warfarin education completed 6/28 pm with teach-back.  Goal of Therapy:  INR 2-3    Plan:  1) Coumadin 10mg  po x1 tonight 2) Daily PT/INR 3) d/c lovenox when INR >/= 2.  Jennifer Sanford, Tsz-Yin 03/22/2013,1:28 PM

## 2013-03-22 NOTE — Progress Notes (Signed)
   Subjective: 5 Days Post-Op Procedure(s) (LRB): TOTAL KNEE BILATERAL (Bilateral) Patient reports pain as mild.   Patient seen in rounds with Dr. Lequita Halt. Patient is well, and has had no acute complaints or problems Patient is ready to go to CIR.  Objective: Vital signs in last 24 hours: Temp:  [98.8 F (37.1 C)-100 F (37.8 C)] 98.8 F (37.1 C) (06/30 0618) Pulse Rate:  [92-102] 98 (06/30 0618) Resp:  [16-20] 16 (06/30 0618) BP: (102-137)/(66-84) 116/72 mmHg (06/30 0618) SpO2:  [95 %-97 %] 95 % (06/30 0618)  Intake/Output from previous day:  Intake/Output Summary (Last 24 hours) at 03/22/13 0701 Last data filed at 03/22/13 0215  Gross per 24 hour  Intake 2271.75 ml  Output   2250 ml  Net  21.75 ml    Intake/Output this shift:    Labs:  Recent Labs  03/20/13 0600 03/21/13 0455 03/22/13 0430  HGB 7.8* 7.2* 9.4*    Recent Labs  03/21/13 0455 03/22/13 0430  WBC 10.1 12.1*  RBC 2.42* 3.22*  HCT 21.5* 28.0*  PLT 204 227    Recent Labs  03/21/13 0455 03/22/13 0430  NA 135 132*  K 3.9 4.3  CL 99 95*  CO2 28 30  BUN 16 13  CREATININE 0.91 0.76  GLUCOSE 114* 111*  CALCIUM 8.8 8.9    Recent Labs  03/21/13 0455 03/22/13 0430  INR 1.12 1.14    EXAM: General - Patient is Alert, Appropriate and Oriented Extremity - Neurovascular intact Sensation intact distally Dorsiflexion/Plantar flexion intact No cellulitis present Incision - clean, dry, no drainage, healing to both knees Motor Function - intact, moving foot and toes well on exam.   Assessment/Plan: 5 Days Post-Op Procedure(s) (LRB): TOTAL KNEE BILATERAL (Bilateral) Procedure(s) (LRB): TOTAL KNEE BILATERAL (Bilateral) Past Medical History  Diagnosis Date  . Hypertension   . Hypercholesteremia   . Arthritis   . Depression   . GERD (gastroesophageal reflux disease)    Principal Problem:   OA (osteoarthritis) of knee Active Problems:   Postoperative anemia due to acute blood loss  Hyponatremia   Postop Transfusion  Estimated body mass index is 31.26 kg/(m^2) as calculated from the following:   Height as of this encounter: 5\' 11"  (1.803 m).   Weight as of this encounter: 101.606 kg (224 lb). Discharge to CIR Diet - Cardiac diet Follow up - in 2 weeks or following the discharge from CIR Activity - WBAT to both legs Disposition - Rehab Condition Upon Discharge - Good D/C Meds - See DC Summary DVT Prophylaxis - Lovenox and Coumadin  Jennifer Sanford 03/22/2013, 7:01 AM

## 2013-03-23 ENCOUNTER — Inpatient Hospital Stay (HOSPITAL_COMMUNITY): Payer: 59 | Admitting: *Deleted

## 2013-03-23 ENCOUNTER — Inpatient Hospital Stay (HOSPITAL_COMMUNITY): Payer: 59 | Admitting: Occupational Therapy

## 2013-03-23 DIAGNOSIS — M7989 Other specified soft tissue disorders: Secondary | ICD-10-CM

## 2013-03-23 DIAGNOSIS — M1710 Unilateral primary osteoarthritis, unspecified knee: Secondary | ICD-10-CM

## 2013-03-23 DIAGNOSIS — M79609 Pain in unspecified limb: Secondary | ICD-10-CM

## 2013-03-23 DIAGNOSIS — IMO0002 Reserved for concepts with insufficient information to code with codable children: Secondary | ICD-10-CM

## 2013-03-23 DIAGNOSIS — Z96659 Presence of unspecified artificial knee joint: Secondary | ICD-10-CM

## 2013-03-23 DIAGNOSIS — D62 Acute posthemorrhagic anemia: Secondary | ICD-10-CM

## 2013-03-23 LAB — CBC WITH DIFFERENTIAL/PLATELET
Basophils Absolute: 0 10*3/uL (ref 0.0–0.1)
Basophils Relative: 0 % (ref 0–1)
Eosinophils Absolute: 0.2 10*3/uL (ref 0.0–0.7)
Eosinophils Relative: 1 % (ref 0–5)
HCT: 27.5 % — ABNORMAL LOW (ref 36.0–46.0)
Hemoglobin: 9.3 g/dL — ABNORMAL LOW (ref 12.0–15.0)
Lymphocytes Relative: 18 % (ref 12–46)
Lymphs Abs: 2.3 10*3/uL (ref 0.7–4.0)
MCH: 29.9 pg (ref 26.0–34.0)
MCHC: 33.8 g/dL (ref 30.0–36.0)
MCV: 88.4 fL (ref 78.0–100.0)
Monocytes Absolute: 1.4 10*3/uL — ABNORMAL HIGH (ref 0.1–1.0)
Monocytes Relative: 11 % (ref 3–12)
Neutro Abs: 9.3 10*3/uL — ABNORMAL HIGH (ref 1.7–7.7)
Neutrophils Relative %: 70 % (ref 43–77)
Platelets: 262 10*3/uL (ref 150–400)
RBC: 3.11 MIL/uL — ABNORMAL LOW (ref 3.87–5.11)
RDW: 14.8 % (ref 11.5–15.5)
WBC: 13.3 10*3/uL — ABNORMAL HIGH (ref 4.0–10.5)

## 2013-03-23 LAB — URINE MICROSCOPIC-ADD ON

## 2013-03-23 LAB — COMPREHENSIVE METABOLIC PANEL
ALT: 73 U/L — ABNORMAL HIGH (ref 0–35)
AST: 46 U/L — ABNORMAL HIGH (ref 0–37)
Albumin: 2.1 g/dL — ABNORMAL LOW (ref 3.5–5.2)
Alkaline Phosphatase: 126 U/L — ABNORMAL HIGH (ref 39–117)
BUN: 13 mg/dL (ref 6–23)
CO2: 30 mEq/L (ref 19–32)
Calcium: 8.5 mg/dL (ref 8.4–10.5)
Chloride: 98 mEq/L (ref 96–112)
Creatinine, Ser: 0.81 mg/dL (ref 0.50–1.10)
GFR calc Af Amer: >90 mL/min (ref >90)
GFR calc non Af Amer: 78 mL/min — ABNORMAL LOW (ref >90)
Glucose, Bld: 114 mg/dL — ABNORMAL HIGH (ref 70–99)
Potassium: 3.7 mEq/L (ref 3.5–5.1)
Sodium: 137 mEq/L (ref 135–145)
Total Bilirubin: 0.8 mg/dL (ref 0.3–1.2)
Total Protein: 6 g/dL (ref 6.0–8.3)

## 2013-03-23 LAB — URINALYSIS, ROUTINE W REFLEX MICROSCOPIC
Bilirubin Urine: NEGATIVE
Glucose, UA: NEGATIVE mg/dL
Hgb urine dipstick: NEGATIVE
Ketones, ur: NEGATIVE mg/dL
Nitrite: NEGATIVE
Protein, ur: 30 mg/dL — AB
Specific Gravity, Urine: 1.018 (ref 1.005–1.030)
Urobilinogen, UA: 1 mg/dL (ref 0.0–1.0)
pH: 7 (ref 5.0–8.0)

## 2013-03-23 LAB — PROTIME-INR
INR: 1.51 — ABNORMAL HIGH (ref 0.00–1.49)
Prothrombin Time: 17.8 seconds — ABNORMAL HIGH (ref 11.6–15.2)

## 2013-03-23 MED ORDER — WARFARIN SODIUM 7.5 MG PO TABS
7.5000 mg | ORAL_TABLET | Freq: Once | ORAL | Status: AC
  Administered 2013-03-23: 7.5 mg via ORAL
  Filled 2013-03-23: qty 1

## 2013-03-23 MED ORDER — OXYCODONE HCL 5 MG PO TABS
5.0000 mg | ORAL_TABLET | ORAL | Status: DC | PRN
  Administered 2013-03-23 – 2013-04-01 (×33): 5 mg via ORAL
  Filled 2013-03-23 (×35): qty 1

## 2013-03-23 NOTE — Progress Notes (Signed)
Delle Reining, PA notified of patient's request for SCD's.  Order received:  Okay for patient to have SCD's.

## 2013-03-23 NOTE — Progress Notes (Signed)
Patient right knee swollen, warm to touch, shiny, and tight. Delle Reining, NP notified and order to put ice on the site and to continue to monitor. Oxycodone given for pain awaiting for results. Patient's daughter at bedside at this time. No signs and symptoms of respiratory distress noted. Call bell within reach. Will continue to monitor.

## 2013-03-23 NOTE — Progress Notes (Addendum)
Physical Therapy Assessment and Plan  Patient Details  Name: Jennifer Sanford MRN: [id number] Date of Birth: 09/06/1951  PT Diagnosis: Difficulty walking, Muscle weakness, Osteoarthritis and Pain in bilateral knee joints Rehab Potential: Good ELOS: 12 days   Today's Date: 03/23/2013 Time: 11:00-12:05 ( ) and 14:25-15:10 ( )   Problem List:  Patient Active Problem List   Diagnosis Date Noted  . S/P bilateral unicompartmental knee replacement 03/22/2013  . Postop Transfusion 03/21/2013  . Postoperative anemia due to acute blood loss 03/18/2013  . Hyponatremia 03/18/2013  . OA (osteoarthritis) of knee 03/17/2013  . TENOSYNOVITIS OF FOOT AND ANKLE 08/28/2009  . FOOT PAIN, RIGHT 08/28/2009  . PES PLANUS 08/28/2009  . ABNORMALITY OF GAIT 08/28/2009    Past Medical History:  Past Medical History  Diagnosis Date  . Hypertension   . Hypercholesteremia   . Arthritis   . Depression   . GERD (gastroesophageal reflux disease)    Past Surgical History:  Past Surgical History  Procedure Laterality Date  . Knee arthroscopy Left 2004  . Abdominal hysterectomy  2002    partial  . Breast reduction surgery  2007  . Tonsillectomy  as child  . Total knee arthroplasty Bilateral 03/17/2013    Procedure: TOTAL KNEE BILATERAL;  Surgeon: Loanne Drilling, MD;  Location: [facility];  Service: Orthopedics;  Laterality: Bilateral;    Assessment & Plan Clinical Impression: Jennifer Sanford is a 62 y.o. right-handed female admitted 03/16/2013 with end stage changes bilateral knees and no relief with conservative care. 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 Bilateral total knee arthroplasty 03/17/2013 per Dr.Alusio. Placed on Coumadin for DVT prophylaxis with subcutaneous Lovenox for INR greater than 2.00. Advised weightbearing as tolerated. Acute blood loss anemia latest hemoglobin 7.2 and transfused with latest hemoglobin 9.4 and monitored. Postoperative pain control  with epidural removed 03/19/2013 Physical and occupational therapy evaluations completed an ongoing with recommendations of physical medicine rehabilitation consult to consider inpatient rehabilitation services. Patient was felt to be a good candidate and was admitted for comprehensive rehabilitation program.  Patient transferred to CIR on 03/22/2013 .   Patient currently requires mod/max with mobility secondary to muscle weakness and muscle joint tightness and decreased standing balance.  Prior to hospitalization, patient was independent  with mobility and lived with Alone in a House home.  Home access is one stepStairs to enter.  Patient will benefit from skilled PT intervention to maximize safe functional mobility, minimize fall risk and decrease caregiver burden for planned discharge home with 24 hour supervision.  Anticipate patient will benefit from follow up HH at discharge.  PT - End of Session Activity Tolerance: Tolerates 10 - 20 min activity with multiple rests Endurance Deficit: Yes Endurance Deficit Description: Increased fatigue, especially due to pain PT Assessment Rehab Potential: Good PT Plan PT Intensity: Minimum of 1-2 x/day ,45 to 90 minutes PT Frequency: 5 out of 7 days PT Duration Estimated Length of Stay: 2 weeks PT Treatment/Interventions: Ambulation/gait training;Balance/vestibular training;Community reintegration;Discharge planning;DME/adaptive equipment instruction;Functional electrical stimulation;Neuromuscular re-education;Functional mobility training;Pain management;Patient/family education;Psychosocial support;Splinting/orthotics;Stair training;Therapeutic Activities;Therapeutic Exercise;UE/LE Strength taining/ROM;Wheelchair propulsion/positioning PT Recommendation Follow Up Recommendations: Outpatient PT;24 hour supervision/assistance;Home health PT Patient destination: Home Equipment Recommended: None recommended by PT Equipment Details: Pt has all equipment  anticipated  Skilled Therapeutic Intervention Pt up in [facility] with considerable pain today, limiting mobility and ROM.  First tx focused on sit<>stand transfers and gait with RW.  Pt performed WC<>toilet with Mod/Max A for lifting and steadying,  safely lowering to seat with cues for optimal LE placement. Gait training in room 2x12' with RW and Mod>>Min A for steadying and managing RW with 1 LOB forward, needing assist to recover.  Discussed positioning for optimal healing and ROM in resting positions. Locked bed in knee ext.  Placed pt on CPM at end of tx on R 0-60 degrees. All needs in reach.   Second tx Pt needing to use restroom - removed from CPM. Bil KIs donned.   Supine>sit with Max A for LEs and hips. Sit<>stand with Mod/Max A and increased time, blocking bil feet to prevent sliding.   Gait in room 2x10' with RW and Min A. Pt needing cues to reduce UE weight bearing and posture.  Toilet transfer with mod/max A.  Sit>supine with Max A.  Supine therex:  Ankle pumps bil x20 Quad sets x10 SLR bil x10 with AAROM, pt unable to lift without lag at this time Heel slides for flexion ROM x10 total with 5sec hold at end range for last 5   PT Evaluation Precautions/Restrictions Precautions Precautions: Knee;Fall Required Braces or Orthoses: Knee Immobilizer - Right;Knee Immobilizer - Left Knee Immobilizer - Right: Discontinue once straight leg raise with < 10 degree lag Knee Immobilizer - Left: Discontinue once straight leg raise with < 10 degree lag Restrictions Weight Bearing Restrictions: No Other Position/Activity Restrictions: WBAT General   Vital Signs  Pain Pain Assessment Pain Assessment: 0-10 Pain Score: 10-Worst pain ever Pain Type: Surgical pain Pain Location: Knee Pain Orientation: Right;Left Pain Descriptors / Indicators: Aching Pain Frequency: Occasional Pain Onset: Gradual Patients Stated Pain Goal: 2 Pain Intervention(s): Medication (See eMAR) (robaxin  500mg po) Home Living/Prior Functioning Home Living Available Help at Discharge: Other (Comment);Friend(s);Available 24 hours/day (Daughter, Almira Coaster) Type of Home: House Home Access: Stairs to enter [employer] of Steps: one step Entrance Stairs-Rails: None Home Layout: Two level;Able to live on main level with bedroom/bathroom  Lives With: Alone Prior Function Level of Independence: Independent with basic ADLs;Independent with gait;Independent with homemaking with ambulation;Independent with transfers  Able to Take Stairs?: Yes Driving: Yes Vocation: Full time employment Vocation Requirements: Lots of walking, sit<>stand Vision/Perception  Vision - History Patient Visual Report: No change from baseline Vision - Assessment Eye Alignment: Within Functional Limits Perception Perception: Within Functional Limits Praxis Praxis: Intact  Cognition Overall Cognitive Status: Within Functional Limits for tasks assessed Orientation Level: Oriented X4 Sensation Sensation Light Touch: Appears Intact Stereognosis: Appears Intact Hot/Cold: Appears Intact Proprioception: Appears Intact Coordination Gross Motor Movements are Fluid and Coordinated: No Fine Motor Movements are Fluid and Coordinated: Yes Coordination and Movement Description: due to limited knee ROM and high pain levels, pt does not have fluid movement of LEs Motor  Motor Motor: Within Functional Limits Motor - Skilled Clinical Observations: Decreased muscle activation due to pain.   Mobility Bed Mobility Bed Mobility: Sit to Supine Sit to Supine: 2: Max assist Sit to Supine - Details (indicate cue type and reason): Bil LE lifting assist as well as turning hips in bed, with increased time required.  Transfers Sit to Stand: 2: Max assist;With armrests;From chair/3-in-1;From toilet Sit to Stand Details (indicate cue type and reason): Heavy lifting assist with LEs in extension, walking UEs up to stand, stuck in  transition  Stand to Sit: 3: Mod assist;With upper extremity assist;With armrests;To bed;To chair/3-in-1;To toilet Stand to Sit Details: Seadying assist, as well as stabilizing RW and WC Locomotion  Ambulation Ambulation: Yes Ambulation/Gait Assistance: 3: Mod assist Ambulation Distance (Feet): 12  Feet Assistive device: Rolling walker Ambulation/Gait Assistance Details: Pt with heavy UE reliance on RW, cues for posture. Decreased flexion in swing phase. Antalgic gait Stairs / Additional Locomotion Stairs: Yes Stairs Assistance: 3: Mod assist Stairs Assistance Details (indicate cue type and reason): Steadying assist. Pt with decreased knee flexion to clear step. Approached sideways with bil UEs on 1 rail.  Stair Management Technique: One rail Left;Step to pattern;Sideways Number of Stairs: 3 Height of Stairs: 6 Wheelchair Mobility Wheelchair Mobility: No  Trunk/Postural Assessment  Cervical Assessment Cervical Assessment: Within Functional Limits Thoracic Assessment Thoracic Assessment: Within Functional Limits Lumbar Assessment Lumbar Assessment: Within Functional Limits Postural Control Postural Control: Within Functional Limits  Balance Balance Balance Assessed: Yes Static Sitting Balance Static Sitting - Balance Support: Bilateral upper extremity supported;Feet supported Static Sitting - Level of Assistance: 5: Stand by assistance Dynamic Sitting Balance Dynamic Sitting - Balance Support: Right upper extremity supported;Left upper extremity supported;Feet supported Dynamic Sitting - Level of Assistance: 4: Min assist Static Standing Balance Static Standing - Balance Support: No upper extremity supported Static Standing - Level of Assistance: 4: Min assist Static Standing - Comment/# of Minutes: Standing x6min to perform personal hiygene and adjust clothing Dynamic Standing Balance Dynamic Standing - Balance Support: No upper extremity supported;During functional  activity Dynamic Standing - Level of Assistance: 4: Min assist Dynamic Standing - Balance Activities: Lateral lean/weight shifting;Reaching for objects;Reaching for weighted objects Extremity Assessment  RUE Assessment RUE Assessment: Within Functional Limits LUE Assessment LUE Assessment: Within Functional Limits RLE Assessment RLE Assessment: Exceptions to [facility] RLE ROM -2 degrees ext, 45 degrees flexion Strength difficult to assess due to pain, but unable to elicit SLR or knee ext LLE Assessment LLE Assessment: Exceptions to WFL LLE ROM -2 degrees ext, 40 degrees flexion Strength difficult to assess due to pain, but unable to elicit SLR or knee ext  FIM:  FIM - Bed/Chair Transfer Bed/Chair Transfer: 2: Supine > Sit: Max A (lifting assist/Pt. 25-49%);2: Bed > Chair or W/C: Max A (lift and lower assist) FIM - Locomotion: Ambulation Ambulation/Gait Assistance: 3: Mod assist   Refer to Care Plan for Long Term Goals  Recommendations for other services: None  Discharge Criteria: Patient will be discharged from PT if patient refuses treatment 3 consecutive times without medical reason, if treatment goals not met, if there is a change in medical status, if patient makes no progress towards goals or if patient is discharged from hospital.  The above assessment, treatment plan, treatment alternatives and goals were discussed and mutually agreed upon: by patient  Clydene Laming, PT, DPT  03/23/2013, 12:46 PM

## 2013-03-23 NOTE — Progress Notes (Signed)
Patient ID: Jennifer Sanford, female   DOB: Jul 07, 1951, 62 y.o.   MRN: [id number] 62 y.o. right-handed female admitted 03/16/2013 with end stage changes bilateral knees and no relief with conservative care. 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 Bilateral total knee arthroplasty 03/17/2013 per Dr.Alusio. Placed on Coumadin for DVT prophylaxis with subcutaneous Lovenox for INR greater than 2.00. Advised weightbearing as tolerated. Acute blood loss anemia latest hemoglobin 7.2 and transfused with latest hemoglobin 9.4 and monitored. Postoperative pain control with epidural removed 03/19/2013   Subjective/Complaints: Slept ok, CPM to 50 degrees Pain control is ok   Objective: Vital Signs: Blood pressure 120/72, pulse 87, temperature 98.7 F (37.1 C), temperature source Oral, resp. rate 18, weight 108.3 kg (238 lb 12.1 oz), SpO2 98.00%. No results found. Results for orders placed during the hospital encounter of 03/22/13 (from the past 72 hour(s))  CBC WITH DIFFERENTIAL     Status: Abnormal   Collection Time    03/23/13  5:30 AM      Result Value Range   WBC 13.3 (*) 4.0 - 10.5 K/uL   RBC 3.11 (*) 3.87 - 5.11 MIL/uL   Hemoglobin 9.3 (*) 12.0 - 15.0 g/dL   HCT 40.9 (*) 81.1 - 91.4 %   MCV 88.4  78.0 - 100.0 fL   MCH 29.9  26.0 - 34.0 pg   MCHC 33.8  30.0 - 36.0 g/dL   RDW 78.2  95.6 - 21.3 %   Platelets 262  150 - 400 K/uL   Neutrophils Relative % 70  43 - 77 %   Neutro Abs 9.3 (*) 1.7 - 7.7 K/uL   Lymphocytes Relative 18  12 - 46 %   Lymphs Abs 2.3  0.7 - 4.0 K/uL   Monocytes Relative 11  3 - 12 %   Monocytes Absolute 1.4 (*) 0.1 - 1.0 K/uL   Eosinophils Relative 1  0 - 5 %   Eosinophils Absolute 0.2  0.0 - 0.7 K/uL   Basophils Relative 0  0 - 1 %   Basophils Absolute 0.0  0.0 - 0.1 K/uL  COMPREHENSIVE METABOLIC PANEL     Status: Abnormal   Collection Time    03/23/13  5:30 AM      Result Value Range   Sodium 137  135 - 145 mEq/L   Potassium 3.7  3.5 -  5.1 mEq/L   Chloride 98  96 - 112 mEq/L   CO2 30  19 - 32 mEq/L   Glucose, Bld 114 (*) 70 - 99 mg/dL   BUN 13  6 - 23 mg/dL   Creatinine, Ser 0.86  0.50 - 1.10 mg/dL   Calcium 8.5  8.4 - 57.8 mg/dL   Total Protein 6.0  6.0 - 8.3 g/dL   Albumin 2.1 (*) 3.5 - 5.2 g/dL   AST 46 (*) 0 - 37 U/L   ALT 73 (*) 0 - 35 U/L   Alkaline Phosphatase 126 (*) 39 - 117 U/L   Total Bilirubin 0.8  0.3 - 1.2 mg/dL   GFR calc non Af Amer 78 (*) >90 mL/min   GFR calc Af Amer >90  >90 mL/min   Comment:            The eGFR has been calculated     using the CKD EPI equation.     This calculation has not been     validated in all clinical     situations.  eGFR's persistently     <90 mL/min signify     possible Chronic Kidney Disease.  PROTIME-INR     Status: Abnormal   Collection Time    03/23/13  5:30 AM      Result Value Range   Prothrombin Time 17.8 (*) 11.6 - 15.2 seconds   INR 1.51 (*) 0.00 - 1.49     HEENT: normal Cardio: RRR Resp: CTA B/L GI: BS positive Extremity:  Edema pretibial 1+ Skin:   Intact and Wound C/D/I Neuro: Alert/Oriented Musc/Skel:  Swelling bilateral knees Gen NAD   Assessment/Plan: 1. Functional deficits secondary to B TKR which require 3+ hours per day of interdisciplinary therapy in a comprehensive inpatient rehab setting. Physiatrist is providing close team supervision and 24 hour management of active medical problems listed below. Physiatrist and rehab team continue to assess barriers to discharge/monitor patient progress toward functional and medical goals. FIM:                   Comprehension Comprehension Mode: Auditory Comprehension: 7-Follows complex conversation/direction: With no assist  Expression Expression Mode: Verbal Expression: 5-Expresses complex 90% of the time/cues < 10% of the time  Social Interaction Social Interaction: 7-Interacts appropriately with others - No medications needed.  Problem Solving Problem Solving:  6-Solves complex problems: With extra time  Memory Memory: 7-Complete Independence: No helper  Medical Problem List and Plan:  1. 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 DVT Prophylaxis/Anticoagulation: Coumadin for DVT prophylaxis. Continue Lovenox for INR greater than 2.00. Monitor for any bleeding episodes. Check vascular study  3. Pain Management: Hydrocodone/Robaxin as needed. Monitor with increased mobility  4. Mood/depression. Celexa 10 mg daily. Provide emotional support  5. Neuropsych: This patient is capable of making decisions on her own behalf.  6. Postoperative anemia. Patient has been transfused. Continue iron supplement. Followup CBC  7. Hypertension. Maxzide daily. Monitor the increased mobility  8. Hyperlipidemia. Lipitor  9. GERD. Protonix  10.  Leukocytosis no fever, wound looks good, monitor, check UA 11.  Increased AST,ALT, likely tylenol will change to Oxy IR, D/C tylenol  LOS (Days) 1 A FACE TO FACE EVALUATION WAS PERFORMED  KIRSTEINS,ANDREW E 03/23/2013, 8:26 AM

## 2013-03-23 NOTE — Progress Notes (Signed)
Social Work Assessment and Plan Social Work Assessment and Plan  Patient Details  Name: Jennifer Sanford MRN: [id number] Date of Birth: 1951-03-25  Today's Date: 03/23/2013  Problem List:  Patient Active Problem List   Diagnosis Date Noted  . S/P bilateral unicompartmental knee replacement 03/22/2013  . Postop Transfusion 03/21/2013  . Postoperative anemia due to acute blood loss 03/18/2013  . Hyponatremia 03/18/2013  . OA (osteoarthritis) of knee 03/17/2013  . TENOSYNOVITIS OF FOOT AND ANKLE 08/28/2009  . FOOT PAIN, RIGHT 08/28/2009  . PES PLANUS 08/28/2009  . ABNORMALITY OF GAIT 08/28/2009   Past Medical History:  Past Medical History  Diagnosis Date  . Hypertension   . Hypercholesteremia   . Arthritis   . Depression   . GERD (gastroesophageal reflux disease)    Past Surgical History:  Past Surgical History  Procedure Laterality Date  . Knee arthroscopy Left 2004  . Abdominal hysterectomy  2002    partial  . Breast reduction surgery  2007  . Tonsillectomy  as child  . Total knee arthroplasty Bilateral 03/17/2013    Procedure: TOTAL KNEE BILATERAL;  Surgeon: Loanne Drilling, MD;  Location: [facility];  Service: Orthopedics;  Laterality: Bilateral;   Social History:  reports that she has never smoked. She has never used smokeless tobacco. She reports that she does not drink alcohol or use illicit drugs.  Family / Support Systems Marital Status: Divorced Patient Roles: Parent;Other (Comment) (Employee) Children: Precious Reel [phone] Other Supports: Two other daughter's Anticipated Caregiver: Three daughter's have set up rotating schedule for short time.   Ability/Limitations of Caregiver: No limitations Caregiver Availability: Other (Comment) (24 hr care for short time via daughter's) Family Dynamics: Three daughter's are supportive and involved.  They are coming at different times to provide care to [facility].  She also has many church members who are supportive  and will assist if needed.  Social History Preferred language: English Religion: Holiness/Pentecostal Cultural Background: No issues Education: College-BSN Read: Yes Write: Yes Employment Status: Employed Name of Employer: [employer] Return to Work Plans: Plans to return when recovered Legal Hisotry/Current Legal Issues: No issues Guardian/Conservator: None-according to MD pt is capable of making her own decisions   Abuse/Neglect Physical Abuse: Denies Verbal Abuse: Denies Sexual Abuse: Denies Exploitation of patient/patient's resources: Denies Self-Neglect: Denies  Emotional Status Pt's affect, behavior adn adjustment status: Pt is motivated to improve.  She is glad the surgery is over, but realizes the recovery is long.  She is having pain issues, but knows to expect this.  She hopes each day is better than the last.  She has a strong faith to get her thorugh and many support people. Recent Psychosocial Issues: Other medical issues Pyschiatric History: Hisotry of depression takes meds for and feels they help her.  She felt depression screeening is not necessary at this time.  Will monitor her coping while here and have nuero-psych intervene if necessary. Substance Abuse History: No issues  Patient / Family Perceptions, Expectations & Goals Pt/Family understanding of illness & functional limitations: Pt is very knowledgable regarding her condition and surgery.  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  She talks with her MD daily and is very involved in her care and treatment plan. Premorbid pt/family roles/activities: Employee, Mother, Mining engineer, Home owner, etc Anticipated changes in roles/activities/participation: resume Pt/family expectations/goals: Pt states; " I take it one day at a time, I hope to get mobile with  a walker before I leave."  " My family will be there to do home management, not physically help me."  [employer]: None Premorbid Home Care/DME Agencies: None Transportation available at discharge: Family  Discharge Planning Living Arrangements: Alone Support Systems: Children;Friends/neighbors;Church/faith community Type of Residence: Private residence Insurance Resources: Media planner (specify) Horticulturist, commercial) Financial Resources: Employment Financial Screen Referred: No Living Expenses: Own Money Management: Patient Do you have any problems obtaining your medications?: No Home Management: Self Patient/Family Preliminary Plans: Return home with daughter's coming to be there with her for a short time.  She has many church memebers who are also willing to assist and may help more once daughter's have returned home.  Wants to be mod/i with walker before discharge. Social Work Anticipated Follow Up Needs: HH/OP  Clinical Impression Pleasant female who is motivated to improve and regain her independence.  She is a Charity fundraiser and very aware of the pain she will have and rehab process.  Will work toward discharge and assist with discharge arrangements.  Lucy Chris 03/23/2013, 1:30 PM

## 2013-03-23 NOTE — Progress Notes (Signed)
Orthopedic Tech Progress Note Patient Details:  Jennifer Sanford [birth year] [id number]  Patient ID: Jennifer Sanford, female   DOB: [birth year], 62 y.o.   MRN: [id number] Brace order completed by [facility]  Tiyah Zelenak 03/23/2013, 7:59 PM

## 2013-03-23 NOTE — Care Management Note (Signed)
[facility]patient [facility] Individual Statement of Services  Patient Name:  Jennifer Sanford  Date:  03/23/2013  Welcome to the [facility]patient Rehabilitation [facility].  Our goal is to provide you with an individualized program based on your diagnosis and situation, designed to meet your specific needs.  With this comprehensive rehabilitation program, you will be expected to participate in at least 3 hours of rehabilitation therapies Monday-Friday, with modified therapy programming on the weekends.  Your rehabilitation program will include the following services:  Physical Therapy (PT), Occupational Therapy (OT), 24 hour per day rehabilitation nursing, Case Management ( Social Worker), Rehabilitation Medicine, Nutrition Services and Pharmacy Services  Weekly team conferences will be held on Wednesday to discuss your progress.  Your Social Worker will talk with you frequently to get your input and to update you on team discussions.  Team conferences with you and your family in attendance may also be held.  Expected length of stay: 12-14 days  Overall anticipated outcome: supervision/mod/i level  Depending on your progress and recovery, your program may change. Your Social Worker will coordinate services and will keep you informed of any changes. Your Child psychotherapist names and contact numbers are listed  below.  The following services may also be recommended but are not provided by the Inpatient Rehabilitation Center:   Driving Evaluations  Home Health Rehabiltiation Services  Outpatient Rehabilitatation [facility]  Vocational Rehabilitation   Arrangements will be made to provide these services after discharge if needed.  Arrangements include referral to agencies that provide these services.  Your insurance has been verified to be:  [facility] Your primary doctor is:  Dr Kellie Shropshire  Pertinent information will be shared with your doctor and your insurance company.  Social Worker:  Dossie Der, [place]  [phone]  Information discussed with and copy given to patient by: Lucy Chris, 03/23/2013, 9:20 AM

## 2013-03-23 NOTE — Progress Notes (Signed)
Patient information reviewed and entered into eRehab system by Jazmaine Fuelling, RN, CRRN, PPS Coordinator.  Information including medical coding and functional independence measure will be reviewed and updated through discharge.    

## 2013-03-23 NOTE — Evaluation (Signed)
Occupational Therapy Assessment and Plan  Patient Details  Name: Jennifer Sanford MRN: [id number] Date of Birth: 26-Jun-1951  OT Diagnosis: acute pain, muscle weakness (generalized) and pain in joint Rehab Potential: Rehab Potential: Good ELOS: 2 weeks   Today's Date: 03/23/2013 Time: 0800-0900 and 1030-1100 Time Calculation (min): 60 min and 30 min  Problem List:  Patient Active Problem List   Diagnosis Date Noted  . S/P bilateral unicompartmental knee replacement 03/22/2013  . Postop Transfusion 03/21/2013  . Postoperative anemia due to acute blood loss 03/18/2013  . Hyponatremia 03/18/2013  . OA (osteoarthritis) of knee 03/17/2013  . TENOSYNOVITIS OF FOOT AND ANKLE 08/28/2009  . FOOT PAIN, RIGHT 08/28/2009  . PES PLANUS 08/28/2009  . ABNORMALITY OF GAIT 08/28/2009    Past Medical History:  Past Medical History  Diagnosis Date  . Hypertension   . Hypercholesteremia   . Arthritis   . Depression   . GERD (gastroesophageal reflux disease)    Past Surgical History:  Past Surgical History  Procedure Laterality Date  . Knee arthroscopy Left 2004  . Abdominal hysterectomy  2002    partial  . Breast reduction surgery  2007  . Tonsillectomy  as child  . Total knee arthroplasty Bilateral 03/17/2013    Procedure: TOTAL KNEE BILATERAL;  Surgeon: Loanne Drilling, MD;  Location: [facility];  Service: Orthopedics;  Laterality: Bilateral;    Assessment & Plan Clinical Impression: Jennifer Sanford is a 62 y.o. right-handed female admitted 03/16/2013 with end stage changes bilateral knees and no relief with conservative care. 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 Bilateral total knee arthroplasty 03/17/2013 per Dr.Alusio. Placed on Coumadin for DVT prophylaxis with subcutaneous Lovenox for INR greater than 2.00. Advised weightbearing as tolerated. Acute blood loss anemia latest hemoglobin 7.2 and transfused with latest hemoglobin 9.4 and monitored. Postoperative  pain control with epidural removed 03/19/2013 Patient transferred to CIR on 03/22/2013 .    Patient currently requires max with basic self-care skills secondary to muscle weakness and muscle joint tightness, decreased cardiorespiratoy endurance and decreased standing balance.  Prior to hospitalization, patient was independent with all ADLS and working full time.   Patient will benefit from skilled intervention to increase independence with basic self-care skills and increase level of independence with iADL prior to discharge home with care partner.  Anticipate patient will require intermittent supervision and no further OT follow recommended.  OT - End of Session Activity Tolerance: Tolerates < 10 min activity, no significant change in vital signs Endurance Deficit: Yes Endurance Deficit Description: becomes short of breath, especially when anxious OT Assessment Rehab Potential: Good OT Plan OT Intensity: Minimum of 1-2 x/day, 45 to 90 minutes OT Frequency: 5 out of 7 days OT Duration/Estimated Length of Stay: 2 weeks OT Treatment/Interventions: Balance/vestibular training;Discharge planning;DME/adaptive equipment instruction;Functional mobility training;Pain management;Psychosocial support;Patient/family education;Self Care/advanced ADL retraining;Therapeutic Activities;Therapeutic Exercise;UE/LE Strength taining/ROM OT Recommendation Patient destination: Home Follow Up Recommendations: None Equipment Recommended: 3 in 1 bedside comode;Tub/shower seat   Skilled Therapeutic Intervention Visit 1:  9/10 pain, nursing provided medication.  Pt seen for the initial evaluation and BADL retraining of toileting, bathing, dressing, bed mobility, transfers, and ambulation with a focus on sit >< stand to walker, standing balance and tolerance, and LE ROM.  Pt was in quite a bit of pain but tolerated the session fairly well with frequent rest breaks. Provided pt a w//c with elevating leg rests.  At end  of session, pt resting in chair  with legs elevated with family in the room.  Visit 2:  Upon entering the room, pt seated in wheelchair with nursing providing ice packs to patient and adjusting her wheelchair as pt stated she was extremely uncomfortable.  This session was spent modifying the wheel chair by adding an extra cushion to raise the height of her seat, adjusting leg rests by lengthening and building up leg pads.  Pt continued to be in pain but stated she was more comfortable.  Pt's PT had arrived for her next session.    OT Evaluation Precautions/Restrictions  Precautions Precautions: Knee;Fall Required Braces or Orthoses: Knee Immobilizer - Right;Knee Immobilizer - Left Knee Immobilizer - Right: Discontinue once straight leg raise with < 10 degree lag Knee Immobilizer - Left: Discontinue once straight leg raise with < 10 degree lag Restrictions Weight Bearing Restrictions: No Other Position/Activity Restrictions: WBAT General Chart Reviewed: Yes Family/Caregiver Present: Yes   Pain Pain Assessment Pain Assessment: 0-10 Pain Score:   9 Pain Type: Acute pain;Surgical pain Pain Location: Knee Pain Orientation: Right;Left Pain Descriptors / Indicators: Aching;Discomfort Pain Frequency: Occasional Pain Onset: Gradual Patients Stated Pain Goal: 3 Pain Intervention(s): Medication (See eMAR) (oxycodone 5 mg po) Home Living/Prior Functioning Home Living Available Help at Discharge: Family;Available 24 hours/day Type of Home: House Home Access: Stairs to enter [employer] of Steps: one step Entrance Stairs-Rails: None Home Layout: Two level;Able to live on main level with bedroom/bathroom  Lives With: Alone Prior Function Level of Independence: Independent with basic ADLs;Independent with gait;Independent with homemaking with ambulation;Independent with transfers  Able to Take Stairs?: Yes Driving: Yes Vocation: Full time employment ADL  max assist with LB self  care, set up with UB self care Vision/Perception  Vision - History Patient Visual Report: No change from baseline Vision - Assessment Eye Alignment: Within Functional Limits Perception Perception: Within Functional Limits Praxis Praxis: Intact  Cognition Overall Cognitive Status: Within Functional Limits for tasks assessed Sensation Sensation Light Touch: Appears Intact Stereognosis: Appears Intact Hot/Cold: Appears Intact Proprioception: Appears Intact Coordination Gross Motor Movements are Fluid and Coordinated: No Fine Motor Movements are Fluid and Coordinated: Yes Coordination and Movement Description: due to limited knee ROM and high pain levels, pt does not have fluid movement of LEs Motor  Motor Motor: Within Functional Limits Motor - Skilled Clinical Observations: Generalized muscle weakness Mobility    Max assist sit to stand and all transfers Trunk/Postural Assessment  Cervical Assessment Cervical Assessment: Within Functional Limits Thoracic Assessment Thoracic Assessment: Within Functional Limits Lumbar Assessment Lumbar Assessment: Within Functional Limits Postural Control Postural Control: Within Functional Limits  Balance Static Standing Balance Static Standing - Level of Assistance: 5: Stand by assistance (with UE support on walker) Dynamic Standing Balance Dynamic Standing - Level of Assistance: 3: Mod assist (with UE support on walker) Extremity/Trunk Assessment RUE Assessment RUE Assessment: Within Functional Limits LUE Assessment LUE Assessment: Within Functional Limits  FIM:  FIM - Eating Eating Activity: 7: Complete independence:no helper FIM - Grooming Grooming Steps: Wash, rinse, dry face;Wash, rinse, dry hands;Oral care, brush teeth, clean dentures;Brush, comb hair Grooming: 7:Complete independence: no helper FIM - Bathing Bathing Steps Patient Completed: Chest;Right Arm;Left Arm;Abdomen;Front perineal area;Buttocks;Right upper leg;Left  upper leg Bathing: 4: Min-Patient completes 8-9 17f 10 parts or 75+ percent FIM - Upper Body Dressing/Undressing Upper body dressing/undressing steps patient completed: Thread/unthread right bra strap;Thread/unthread left bra strap;Hook/unhook bra;Thread/unthread right sleeve of pullover shirt/dresss;Thread/unthread left sleeve of pullover shirt/dress;Put head through opening of pull over shirt/dress;Pull shirt over trunk  Upper body dressing/undressing: 5: Set-up assist to: Obtain clothing/put away FIM - Lower Body Dressing/Undressing Lower body dressing/undressing steps patient completed: Pull pants up/down Lower body dressing/undressing: 2: Max-Patient completed 25-49% of tasks FIM - Toileting Toileting steps completed by patient: Performs perineal hygiene;Adjust clothing after toileting Toileting: 3: Mod-Patient completed 2 of 3 steps FIM - Bed/Chair Transfer Bed/Chair Transfer: 2: Supine > Sit: Max A (lifting assist/Pt. 25-49%);2: Bed > Chair or W/C: Max A (lift and lower assist) FIM - Archivist Transfers Assistive Devices: Bedside commode;Walker;Grab bars (BSC over toilet) Toilet Transfers: 2-To toilet/BSC: Max A (lift and lower assist);2-From toilet/BSC: Max A (lift and lower assist) FIM - Tub/Shower Transfers Tub/shower Transfers: 0-Activity did not occur or was simulated   Refer to Care Plan for Long Term Goals  Recommendations for other services: None  Discharge Criteria: Patient will be discharged from OT if patient refuses treatment 3 consecutive times without medical reason, if treatment goals not met, if there is a change in medical status, if patient makes no progress towards goals or if patient is discharged from hospital.  The above assessment, treatment plan, treatment alternatives and goals were discussed and mutually agreed upon: by patient and by family  Dezerae Freiberger 03/23/2013, 9:23 AM

## 2013-03-23 NOTE — Progress Notes (Signed)
Bilateral lower extremity venous duplex:  No evidence of DVT, superficial thrombosis, or Baker's Cyst.   

## 2013-03-23 NOTE — Progress Notes (Signed)
ANTICOAGULATION CONSULT NOTE - Initial Consult  Pharmacy Consult for coumadin Indication: VTE prophylaxis  No Known Allergies  Patient Measurements: Weight: 238 lb 12.1 oz (108.3 kg) Heparin Dosing Weight:   Vital Signs: Temp: 98.7 F (37.1 C) (07/01 0445) Temp src: Oral (07/01 0445) BP: 120/72 mmHg (07/01 0445) Pulse Rate: 87 (07/01 0445)  Labs:  Recent Labs  03/21/13 0455 03/22/13 0430 03/23/13 0530  HGB 7.2* 9.4* 9.3*  HCT 21.5* 28.0* 27.5*  PLT 204 227 262  LABPROT 14.2 14.4 17.8*  INR 1.12 1.14 1.51*  CREATININE 0.91 0.76 0.81    The CrCl is unknown because both a height and weight (above a minimum accepted value) are required for this calculation.   Medical History: Past Medical History  Diagnosis Date  . Hypertension   . Hypercholesteremia   . Arthritis   . Depression   . GERD (gastroesophageal reflux disease)     Medications:  Scheduled:  . atorvastatin  40 mg Oral q1800  . citalopram  10 mg Oral q morning - 10a  . enoxaparin (LOVENOX) injection  30 mg Subcutaneous Q12H  . iron polysaccharides  150 mg Oral Daily  . pantoprazole  40 mg Oral Daily  . polyethylene glycol  17 g Oral Daily  . senna-docusate  2 tablet Oral QHS  . triamterene-hydrochlorothiazide  0.5 tablet Oral q morning - 10a  . Warfarin - Pharmacist Dosing Inpatient   Does not apply q1800   Infusions:    Assessment: Anticoag: 62 yo s/p Bilateral TKA on 6/25 with epidural in for pain control. Lovenox for DVT ppx started at appropriate time after pulling epidural in combo with warfarin until INR therapeutic. Warfarin started 6/26. INR today is 1.51, still subtherapeutic but big jump from yesterday.    Warfarin education completed 6/28 pm with teach-back.  Goal of Therapy:  INR 2-3   Plan:  1) Coumadin 7.5mg  po x1 tonight 2) Daily PT/INR 3) d/c lovenox when INR >/= 2.  Wendie Simmer, PharmD, BCPS Clinical Pharmacist  Pager: [phone]

## 2013-03-24 ENCOUNTER — Inpatient Hospital Stay (HOSPITAL_COMMUNITY): Payer: 59 | Admitting: Physical Therapy

## 2013-03-24 ENCOUNTER — Inpatient Hospital Stay (HOSPITAL_COMMUNITY): Payer: 59 | Admitting: Occupational Therapy

## 2013-03-24 ENCOUNTER — Inpatient Hospital Stay (HOSPITAL_COMMUNITY): Payer: 59

## 2013-03-24 LAB — PROTIME-INR
INR: 1.8 — ABNORMAL HIGH (ref 0.00–1.49)
Prothrombin Time: 20.4 seconds — ABNORMAL HIGH (ref 11.6–15.2)

## 2013-03-24 MED ORDER — WARFARIN SODIUM 7.5 MG PO TABS
7.5000 mg | ORAL_TABLET | Freq: Once | ORAL | Status: AC
  Administered 2013-03-24: 7.5 mg via ORAL
  Filled 2013-03-24: qty 1

## 2013-03-24 NOTE — Progress Notes (Signed)
Physical Therapy Session Note  Patient Details  Name: ABREE ROMICK MRN: [id number] Date of Birth: 1951/04/02  Today's Date: 03/24/2013 Time: 0810-0910 and 1130-1200 Time Calculation (min): 60 min and 30 min  Short Term Goals: Week 1:  PT Short Term Goal 1 (Week 1): Pt will perform bed mobility with Min A  PT Short Term Goal 2 (Week 1): Pt will perform bed<>chair transfers with Min A PT Short Term Goal 3 (Week 1): Pt will ambulate 8' with RW and S PT Short Term Goal 4 (Week 1): Pt will obtain 50 degrees knee ROM bilaterally from supine position PT Short Term Goal 5 (Week 1): Pt will perform 6 stairs with bil rails and min A   Skilled Therapeutic Interventions/Progress Updates:  1st tx:  Treatment focused on bed mobility, transfers, gait, increasing flexibility bil knees.  Bed mobility with HOB raised, mod assist supine> sit to move bil LEs.  Sit> stand from raised bed to RW, +2 assist to block feet and stabilize RW as pt moves RW laterally as she places 1 hand at a time on Rw.  Gait with Rw x 50' with min guard assist, bil KIs.  W/c> mat attempted scoot transfer due to pain, but pt unable.  Sit> stand for transfer +2 asa above.  Therapeutic activity in sitting working on knee flexion, attempting to kick beach ball.  Flexion very limited.  Pt was more successful with knee flexion moving feet toward targets placed under LEs.  After activity, L knee 55 degrees active; R knee 50 degrees active.  Pain and stiffness/edema bil knees limit pt's participation in therapy.  2nd tx: mobility and strengthening  Sit> stand from w/c +2 assist as above.  Gait with RW, KIs x 85' with min guard assist, VCS for upright posture, forward gaze.  Standing with Rw: 10 x 1 each bil calf raises, open chain hip abduction, wt shifting L><R with toe raises    Therapy Documentation Precautions:  Precautions Precautions: Knee;Fall Required Braces or Orthoses: Knee Immobilizer - Right;Knee Immobilizer -  Left Knee Immobilizer - Right: Discontinue once straight leg raise with < 10 degree lag Knee Immobilizer - Left: Discontinue once straight leg raise with < 10 degree lag Restrictions Weight Bearing Restrictions: No Other Position/Activity Restrictions: WBAT Pain: Pain Assessment Pain Assessment: 0-10 Pain Score: 5  Pain Type: Surgical pain Pain Location: Knee Pain Orientation: Right Pain Descriptors / Indicators: Aching Pain Frequency: Intermittent Pain Onset: On-going Patients Stated Pain Goal: 3 Pain Intervention(s): Repositioned 2nd Pain Site Pain Score: 2 Pain Type: Acute pain Pain Location: Knee Pain Orientation: Left Pain Onset: On-going Pain Intervention(s): Repositioned    See FIM for current functional status  Therapy/Group: Individual Therapy  Sherece Gambrill 03/24/2013, 11:03 AM

## 2013-03-24 NOTE — Progress Notes (Signed)
Occupational Therapy Session Note  Patient Details  Name: Jennifer Sanford MRN: [id number] Date of Birth: May 04, 1951  Today's Date: 03/24/2013 Time: [phone] Time Calculation (min): 60 min  Short Term Goals: Week 1:  OT Short Term Goal 1 (Week 1): Pt will complete sit to stand from toilet with min assist to increase independence with toileting. OT Short Term Goal 2 (Week 1): Pt will don pants over feet with min assist. OT Short Term Goal 3 (Week 1): Pt will be able to transfer to a shower bench with min assist.  Skilled Therapeutic Interventions/Progress Updates:    Pt scheduled for B/D but those tasks were completed by her daughter from bed level prior to her first PT session.  Pt seen this session for functional mobility, bathroom transfers with a focus on sit to stand and standing balance.  Ted hose placed on patient and then knee immobilizers. Pt initially worked on Corporate treasurer with full tricep extension. She was able to tolerate putting weight through her heels during the pushups. She was able to stand from chair to RW with only min assist today.  She ambulated to toilet with RW with close supervision.  She was also able to sit to stand from elevated toilet seat by pushing up through arm rests and gradually stepping feet in and out to prevent over flexion of the knees. Pt then worked on UE AROM exercises to increase her activity tolerance.  Pt resting in w/c at end of session with call light within reach.  Therapy Documentation Precautions:  Precautions Precautions: Knee;Fall Required Braces or Orthoses: Knee Immobilizer - Right;Knee Immobilizer - Left Knee Immobilizer - Right: Discontinue once straight leg raise with < 10 degree lag Knee Immobilizer - Left: Discontinue once straight leg raise with < 10 degree lag Restrictions Weight Bearing Restrictions: No Other Position/Activity Restrictions: WBAT   Pain: Pain Assessment Pain Assessment: 0-10 Pain Score: 9  Pain Type: Acute  pain;Surgical pain Pain Location: Knee Pain Orientation: Right;Left Pain Descriptors / Indicators: Aching Pain Frequency: Intermittent Pain Onset: On-going Patients Stated Pain Goal: 3 Pain Intervention(s): Medication (See eMAR) 2nd Pain Site Pain Score: 2 Pain Type: Acute pain Pain Location: Knee Pain Orientation: Left Pain Onset: On-going Pain Intervention(s): Repositioned  See FIM for current functional status  Therapy/Group: Individual Therapy  Giang Hemme 03/24/2013, 11:59 AM

## 2013-03-24 NOTE — Progress Notes (Signed)
ANTICOAGULATION CONSULT NOTE   Pharmacy Consult for coumadin Indication: VTE prophylaxis  No Known Allergies  Labs:  Recent Labs  03/22/13 0430 03/23/13 0530 03/24/13 0625  HGB 9.4* 9.3*  --   HCT 28.0* 27.5*  --   PLT 227 262  --   LABPROT 14.4 17.8* 20.4*  INR 1.14 1.51* 1.80*  CREATININE 0.76 0.81  --     The CrCl is unknown because both a height and weight (above a minimum accepted value) are required for this calculation.   Assessment: Anticoag: 62 yo s/p Bilateral TKA on 6/25 with epidural in for pain control. Lovenox for DVT ppx started at appropriate time after pulling epidural in combo with warfarin until INR therapeutic. Warfarin started 6/26. INR today is 1.8, still subtherapeutic but increasing nicely  Warfarin education completed 6/28 pm with teach-back.  Goal of Therapy:  INR 2-3   Plan:  1) Coumadin 7.5mg  po x1 tonight 2) Daily PT/INR 3) d/c lovenox when INR >/= 2.  Thank you. Okey Regal, PharmD [phone]

## 2013-03-24 NOTE — Patient Care Conference (Signed)
Inpatient RehabilitationTeam Conference and Plan of Care Update Date: 03/24/2013   Time: 10;45 AM    Patient Name: Jennifer Sanford      Medical Record Number: [id number]  Date of Birth: 1951-06-15 Sex: Female         Room/Bed: 4034/4034-01 Payor Info: Payor: [employer] / Plan: [employer] / Product Type: *No Product type* /    Admitting Diagnosis: B TKR  Admit Date/Time:  03/22/2013  1:12 PM Admission Comments: No comment available   Primary Diagnosis:  S/P bilateral unicompartmental knee replacement Principal Problem: S/P bilateral unicompartmental knee replacement  Patient Active Problem List   Diagnosis Date Noted  . S/P bilateral unicompartmental knee replacement 03/22/2013  . Postop Transfusion 03/21/2013  . Postoperative anemia due to acute blood loss 03/18/2013  . Hyponatremia 03/18/2013  . OA (osteoarthritis) of knee 03/17/2013  . TENOSYNOVITIS OF FOOT AND ANKLE 08/28/2009  . FOOT PAIN, RIGHT 08/28/2009  . PES PLANUS 08/28/2009  . ABNORMALITY OF GAIT 08/28/2009    Expected Discharge Date: Expected Discharge Date: 04/03/13  Team Members Present: Physician leading conference: Dr. Claudette Laws Social Worker Present: Dossie Der, LCSW Nurse Present: Rosalio Macadamia, RN PT Present: Edman Circle, PT;Caroline Adriana Simas, PT;Other (comment) Clarisse Gouge Ripa-PT) OT Present: Other (comment);Leonette Monarch, Felipa Eth, OT (Kayla Perkinosn-OT) SLP Present: Feliberto Gottron, SLP Other (Discipline and Name): marie Noel-PPS     Current Status/Progress Goal Weekly Team Focus  Medical   pain and poor ROM s/p Bilateral TKR, doppler neg  improve pain, strength and ROM in BLE  adjust meds, CPM   Bowel/Bladder   Continent of bowel and bladder. LBM 03/23/2013.; Uses BSC  Remain continent of bowel and bladder min. assist  Monitor for any s/s of constipation or diarrhea    Swallow/Nutrition/ Hydration     na        ADL's   max assist bed mobility,. transfers,  and LB ADLS  mod I  bathing, toileting, dressing, toilet transfers; supervision shower stall transfer and simple meal prep  ADL retraining, DME and AE training, functional mobility training, pt/ family education, strengthening   Mobility   Max A bed mobility and sit>stand transfers, Min A gait with RW  S overall, Min A stairs   bil knee ROM, bed mobility, transfers, gait with RW   Communication     na        Safety/Cognition/ Behavioral Observations  n/a         Pain   Bilatera l knee pain more on right sknee. Oxycodone 5 mg, Robaxin and ZTramadol given with relief noted.  Pain level 3 or less on a scale of 0-10  Monitor any onset of pain. Monitor effectiveness of pain meds   Skin   Bilateral knee incision with steristrips clean dry and intact. Right knee warm to touch, shiny, very tight  No new skin breakdown or infection  Monitor skin q shift.      *See Care Plan and progress notes for long and short-term goals.  Barriers to Discharge: mobilizing slowly cont program    Possible Resolutions to Barriers:       Discharge Planning/Teaching Needs:  Home with three daughter's rotating and has friends to assist for short time      Team Discussion:  ROM improving slowly-pain issues Dopplers negative.  Progressing and pushing herself in therapies.  Adjusting pain meds.  Revisions to Treatment Plan:  None   Continued Need for Acute Rehabilitation Level of Care: The patient requires  daily medical management by a physician with specialized training in physical medicine and rehabilitation for the following conditions: Daily direction of a multidisciplinary physical rehabilitation program to ensure safe treatment while eliciting the highest outcome that is of practical value to the patient.: Yes Daily medical management of patient stability for increased activity during participation in an intensive rehabilitation regime.: Yes Daily analysis of laboratory values and/or radiology reports with any subsequent need  for medication adjustment of medical intervention for : Post surgical problems  [name], [name] 03/24/2013, 2:56 PM

## 2013-03-24 NOTE — Progress Notes (Signed)
Social Work Patient ID: Jennifer Sanford, female   DOB: 1950/12/12, 62 y.o.   MRN: [id number] Met with pt and daughter to inform team conference goals-supervision/mod/i level and discharge 7/12.  She reports: " I am trying my best but it is rough." Pt reports: ' I know it will get better, I am holding onto this."  Agreeable to recommendations and asked if reaches her goals sooner could discharge be moved up. Will continue to work on discharge needs and progress.

## 2013-03-24 NOTE — Progress Notes (Signed)
Physical Therapy Note  Patient Details  Name: Jennifer Sanford MRN: [id number] Date of Birth: 06/20/1951 Today's Date: 03/24/2013  1300-1350 (50 minutes) individual Pain: 6/10 bilateral knees/ premedicated Focus of treatment: Therapeutic exercise focused on bilateral LE AROM (knees) / strengthening Treatment: wc mobility 120 feet SBA on unit; sit to stand (with bilateral knee immobilizers ) min to close SBA to RW; transfer RW SBA; sit to supine (mat) mod assist bilateral LE; supine to sit (mat) close SBA with difficulty LEs; AAROM bilateral knee flexion/extension in supine; SAQs, ankle pumps X 20; sitting knee flexion using green theraband to hamstrings with 5 second holds at end of active range.    Daymien Goth,JIM 03/24/2013, 1:56 PM

## 2013-03-24 NOTE — Progress Notes (Signed)
Patient ID: Jennifer Sanford, female   DOB: 11-04-1950, 62 y.o.   MRN: [id number] 62 y.o. right-handed female admitted 03/16/2013 with end stage changes bilateral knees and no relief with conservative care. 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 Bilateral total knee arthroplasty 03/17/2013 per Dr.Alusio. Placed on Coumadin for DVT prophylaxis with subcutaneous Lovenox for INR greater than 2.00. Advised weightbearing as tolerated. Acute blood loss anemia latest hemoglobin 7.2 and transfused with latest hemoglobin 9.4 and monitored. Postoperative pain control with epidural removed 03/19/2013   Subjective/Complaints: Slept ok, CPM to 65 degrees Pain control is ok Concerned about thigh swelling  Dopplers neg BLE yesterday  Objective: Vital Signs: Blood pressure 112/63, pulse 77, temperature 98.7 F (37.1 C), temperature source Oral, resp. rate 18, weight 108.3 kg (238 lb 12.1 oz), SpO2 98.00%. No results found. Results for orders placed during the hospital encounter of 03/22/13 (from the past 72 hour(s))  CBC WITH DIFFERENTIAL     Status: Abnormal   Collection Time    03/23/13  5:30 AM      Result Value Range   WBC 13.3 (*) 4.0 - 10.5 K/uL   RBC 3.11 (*) 3.87 - 5.11 MIL/uL   Hemoglobin 9.3 (*) 12.0 - 15.0 g/dL   HCT 41.3 (*) 24.4 - 01.0 %   MCV 88.4  78.0 - 100.0 fL   MCH 29.9  26.0 - 34.0 pg   MCHC 33.8  30.0 - 36.0 g/dL   RDW 27.2  53.6 - 64.4 %   Platelets 262  150 - 400 K/uL   Neutrophils Relative % 70  43 - 77 %   Neutro Abs 9.3 (*) 1.7 - 7.7 K/uL   Lymphocytes Relative 18  12 - 46 %   Lymphs Abs 2.3  0.7 - 4.0 K/uL   Monocytes Relative 11  3 - 12 %   Monocytes Absolute 1.4 (*) 0.1 - 1.0 K/uL   Eosinophils Relative 1  0 - 5 %   Eosinophils Absolute 0.2  0.0 - 0.7 K/uL   Basophils Relative 0  0 - 1 %   Basophils Absolute 0.0  0.0 - 0.1 K/uL  COMPREHENSIVE METABOLIC PANEL     Status: Abnormal   Collection Time    03/23/13  5:30 AM      Result Value  Range   Sodium 137  135 - 145 mEq/L   Potassium 3.7  3.5 - 5.1 mEq/L   Chloride 98  96 - 112 mEq/L   CO2 30  19 - 32 mEq/L   Glucose, Bld 114 (*) 70 - 99 mg/dL   BUN 13  6 - 23 mg/dL   Creatinine, Ser 0.34  0.50 - 1.10 mg/dL   Calcium 8.5  8.4 - 74.2 mg/dL   Total Protein 6.0  6.0 - 8.3 g/dL   Albumin 2.1 (*) 3.5 - 5.2 g/dL   AST 46 (*) 0 - 37 U/L   ALT 73 (*) 0 - 35 U/L   Alkaline Phosphatase 126 (*) 39 - 117 U/L   Total Bilirubin 0.8  0.3 - 1.2 mg/dL   GFR calc non Af Amer 78 (*) >90 mL/min   GFR calc Af Amer >90  >90 mL/min   Comment:            The eGFR has been calculated     using the CKD EPI equation.     This calculation has not been     validated in all clinical  situations.     eGFR's persistently     <90 mL/min signify     possible Chronic Kidney Disease.  PROTIME-INR     Status: Abnormal   Collection Time    03/23/13  5:30 AM      Result Value Range   Prothrombin Time 17.8 (*) 11.6 - 15.2 seconds   INR 1.51 (*) 0.00 - 1.49  URINALYSIS, ROUTINE W REFLEX MICROSCOPIC     Status: Abnormal   Collection Time    03/23/13 11:40 AM      Result Value Range   Color, Urine YELLOW  YELLOW   APPearance CLEAR  CLEAR   Specific Gravity, Urine 1.018  1.005 - 1.030   pH 7.0  5.0 - 8.0   Glucose, UA NEGATIVE  NEGATIVE mg/dL   Hgb urine dipstick NEGATIVE  NEGATIVE   Bilirubin Urine NEGATIVE  NEGATIVE   Ketones, ur NEGATIVE  NEGATIVE mg/dL   Protein, ur 30 (*) NEGATIVE mg/dL   Urobilinogen, UA 1.0  0.0 - 1.0 mg/dL   Nitrite NEGATIVE  NEGATIVE   Leukocytes, UA SMALL (*) NEGATIVE  URINE MICROSCOPIC-ADD ON     Status: None   Collection Time    03/23/13 11:40 AM      Result Value Range   Squamous Epithelial / LPF RARE  RARE   WBC, UA 7-10  <3 WBC/hpf   Bacteria, UA RARE  RARE  PROTIME-INR     Status: Abnormal   Collection Time    03/24/13  6:25 AM      Result Value Range   Prothrombin Time 20.4 (*) 11.6 - 15.2 seconds   INR 1.80 (*) 0.00 - 1.49     HEENT:  normal Cardio: RRR Resp: CTA B/L GI: BS positive Extremity:  Edema pretibial 1+ Skin:   Intact and Wound C/D/I Neuro: Alert/Oriented Musc/Skel:  Swelling bilateral knees, bilateral lower ext edema, quads and gastrocs sore to palpation Gen NAD   Assessment/Plan: 1. Functional deficits secondary to B TKR which require 3+ hours per day of interdisciplinary therapy in a comprehensive inpatient rehab setting. Physiatrist is providing close team supervision and 24 hour management of active medical problems listed below. Physiatrist and rehab team continue to assess barriers to discharge/monitor patient progress toward functional and medical goals. FIM: FIM - Bathing Bathing Steps Patient Completed: Chest;Right Arm;Left Arm;Abdomen;Front perineal area;Buttocks;Right upper leg;Left upper leg Bathing: 4: Min-Patient completes 8-9 62f 10 parts or 75+ percent  FIM - Upper Body Dressing/Undressing Upper body dressing/undressing steps patient completed: Thread/unthread right bra strap;Thread/unthread left bra strap;Hook/unhook bra;Thread/unthread right sleeve of pullover shirt/dresss;Thread/unthread left sleeve of pullover shirt/dress;Put head through opening of pull over shirt/dress;Pull shirt over trunk Upper body dressing/undressing: 5: Set-up assist to: Obtain clothing/put away FIM - Lower Body Dressing/Undressing Lower body dressing/undressing steps patient completed: Pull pants up/down Lower body dressing/undressing: 2: Max-Patient completed 25-49% of tasks  FIM - Toileting Toileting steps completed by patient: Performs perineal hygiene;Adjust clothing after toileting Toileting Assistive Devices: Grab bar or rail for support Toileting: 3: Mod-Patient completed 2 of 3 steps  FIM - Diplomatic Services operational officer Devices: Elevated toilet seat;Walker;Grab bars Toilet Transfers: 2-To toilet/BSC: Max A (lift and lower assist);3-From toilet/BSC: Mod A (lift or lower assist)  FIM -  Banker Devices: Walker;Arm rests Bed/Chair Transfer: 2: Sit > Supine: Max A (lifting assist/Pt. 25-49%);3: Bed > Chair or W/C: Mod A (lift or lower assist);2: Chair or W/C > Bed: Max A (lift and lower assist)  FIM - Locomotion: Wheelchair Locomotion: Wheelchair: 1: Total Assistance/staff pushes wheelchair (Pt<25%) FIM - Locomotion: Ambulation Locomotion: Ambulation Assistive Devices: Designer, industrial/product Ambulation/Gait Assistance: 3: Mod assist Locomotion: Ambulation: 1: Travels less than 50 ft with moderate assistance (Pt: 50 - 74%)  Comprehension Comprehension Mode: Auditory Comprehension: 7-Follows complex conversation/direction: With no assist  Expression Expression Mode: Verbal Expression: 7-Expresses complex ideas: With no assist  Social Interaction Social Interaction: 7-Interacts appropriately with others - No medications needed.  Problem Solving Problem Solving: 6-Solves complex problems: With extra time  Memory Memory: 6-More than reasonable amt of time  Medical Problem List and Plan:  1. 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 DVT Prophylaxis/Anticoagulation: Coumadin for DVT prophylaxis. Continue Lovenox for INR greater than 2.00. Monitor for any bleeding episodes. Check vascular study  3. Pain Management: Hydrocodone/Robaxin as needed. Monitor with increased mobility  4. Mood/depression. Celexa 10 mg daily. Provide emotional support  5. Neuropsych: This patient is capable of making decisions on her own behalf.  6. Postoperative anemia. Patient has been transfused. Continue iron supplement. Followup CBC  7. Hypertension. Maxzide daily. Monitor the increased mobility  8. Hyperlipidemia. Lipitor  9. GERD. Protonix  10.  Leukocytosis no fever, wound looks good, monitor, check UA 11.  Increased AST,ALT, likely tylenol will change to Oxy IR, D/C tylenol  LOS (Days) 2 A FACE  TO FACE EVALUATION WAS PERFORMED  KIRSTEINS,ANDREW E 03/24/2013, 9:29 AM

## 2013-03-25 ENCOUNTER — Inpatient Hospital Stay (HOSPITAL_COMMUNITY): Payer: 59

## 2013-03-25 ENCOUNTER — Inpatient Hospital Stay (HOSPITAL_COMMUNITY): Payer: 59 | Admitting: Occupational Therapy

## 2013-03-25 LAB — PROTIME-INR
INR: 2.09 — ABNORMAL HIGH (ref 0.00–1.49)
Prothrombin Time: 22.8 seconds — ABNORMAL HIGH (ref 11.6–15.2)

## 2013-03-25 MED ORDER — WARFARIN SODIUM 7.5 MG PO TABS
7.5000 mg | ORAL_TABLET | Freq: Once | ORAL | Status: AC
  Administered 2013-03-25: 7.5 mg via ORAL
  Filled 2013-03-25: qty 1

## 2013-03-25 MED ORDER — HYDROCORTISONE 1 % EX CREA
TOPICAL_CREAM | Freq: Three times a day (TID) | CUTANEOUS | Status: DC | PRN
  Administered 2013-03-25: 21:00:00 via TOPICAL
  Filled 2013-03-25: qty 28

## 2013-03-25 NOTE — Progress Notes (Signed)
Occupational Therapy Session Note  Patient Details  Name: Jennifer Sanford MRN: [id number] Date of Birth: 1951/07/31  Today's Date: 03/25/2013 Time: 0900-1000 and 1100-1140 Time Calculation (min): 60 min and 40 min Short Term Goals: Week 1:  OT Short Term Goal 1 (Week 1): Pt will complete sit to stand from toilet with min assist to increase independence with toileting. OT Short Term Goal 2 (Week 1): Pt will don pants over feet with min assist. OT Short Term Goal 3 (Week 1): Pt will be able to transfer to a shower bench with min assist.  Skilled Therapeutic Interventions/Progress Updates:    Visit 1:   10/10 pain in knees. Nursing provided pt with pain medication.  Pt seen for BADL retraining of toileting, bathing at shower level , and dressing with a focus on sit stand and standing balance.  Pt in wheelchair, Teds and KIs removed and pt stood from chair by pushing up with her arms and inching her feet in towards her with steady assist.  She ambulated to bathroom with close supervision where she completed toileting and shower.  At end of session pt resting in w/c with bathrobe on and legs elevated.  Her dressing will be completed in the next session.  Visit 2: Pt seen this session to complete LB dressing with use of a reacher. She was able to don pants over feet and pull them up but needed assist to don TEDs and shoes. AROM 20 x and A/AROM of both knees with towel slides for 10 reps.  Chair push ups with assistance to keep knees in mild flexion. Pt then transferred to bed with walker with min assist and mod assist to lift both legs into bed.  Pt resting in bed at end of session with ice packs on knees.  Therapy Documentation Precautions:  Precautions Precautions: Knee;Fall Required Braces or Orthoses: Knee Immobilizer - Right;Knee Immobilizer - Left Knee Immobilizer - Right: Discontinue once straight leg raise with < 10 degree lag Knee Immobilizer - Left: Discontinue once straight leg raise with  < 10 degree lag Restrictions Weight Bearing Restrictions: No Other Position/Activity Restrictions: WBAT   Pain: Pain Assessment Pain Assessment: 0-10 Pain Score: 8  Pain Type: Acute pain Pain Location: Knee Pain Orientation: Right;Left Pain Descriptors / Indicators: Aching Pain Intervention(s): Medication (See eMAR) ADL:  See FIM for current functional status  Therapy/Group: Individual Therapy  SAGUIER,JULIA 03/25/2013, 10:26 AM

## 2013-03-25 NOTE — Progress Notes (Signed)
Physical Therapy Session Note  Patient Details  Name: Jennifer Sanford MRN: [id number] Date of Birth: 14-Apr-1951  Today's Date: 03/25/2013 Time: 0800-0900 Time Calculation (min): 60 min  Short Term Goals: Week 1:  PT Short Term Goal 1 (Week 1): Pt will perform bed mobility with Min A  PT Short Term Goal 2 (Week 1): Pt will perform bed<>chair transfers with Min A PT Short Term Goal 3 (Week 1): Pt will ambulate 74' with RW and S PT Short Term Goal 4 (Week 1): Pt will obtain 50 degrees knee ROM bilaterally from supine position PT Short Term Goal 5 (Week 1): Pt will perform 6 stairs with bil rails and min A   Skilled Therapeutic Interventions/Progress Updates:  Treatment focused on bed mobility, transfers, gait, w/c mobility- see FIM scores  Pain continues to limit pt.  Pt benefits from use of side step stool to rest feet on as she sits EOB before attempting to stand.  Therapeutic exercise performed with LE to increase strength and flexibility for functional mobility:  SLRs continue to require AA; > 10 degrees extensor lag noted bil knees. L and R AA knee extension in sitting, bil ankle pumps.   Gait x 70' with RW, bil KIs, working on upright posture, forward gaze.  W/c mobility x 100' with supervision, bil UEs for activity tolerance, UE strengthening for use of RW.    Therapy Documentation Precautions:  Precautions Precautions: Knee;Fall Required Braces or Orthoses: Knee Immobilizer - Right;Knee Immobilizer - Left Knee Immobilizer - Right: Discontinue once straight leg raise with < 10 degree lag Knee Immobilizer - Left: Discontinue once straight leg raise with < 10 degree lag Restrictions Weight Bearing Restrictions: No Other Position/Activity Restrictions: WBAT Therapy Vitals Temp: 98 F (36.7 C) Temp src: Oral Pulse Rate: 88 Resp: 16 BP: 120/73 mmHg Patient Position, if appropriate: Lying Oxygen Therapy SpO2: 98 % O2 Device: None (Room air) Pain: Pain Assessment Pain  Assessment: 0-10 Pain Score: 5  Pain Intervention(s): Repositioned;Cold applied    See FIM for current functional status  Therapy/Group: Individual Therapy  Shantel Helwig 03/25/2013, 5:38 PM

## 2013-03-25 NOTE — Progress Notes (Signed)
ANTICOAGULATION CONSULT NOTE   Pharmacy Consult for coumadin Indication: VTE prophylaxis  No Known Allergies  Labs:  Recent Labs  03/23/13 0530 03/24/13 0625 03/25/13 0548  HGB 9.3*  --   --   HCT 27.5*  --   --   PLT 262  --   --   LABPROT 17.8* 20.4* 22.8*  INR 1.51* 1.80* 2.09*  CREATININE 0.81  --   --     The CrCl is unknown because both a height and weight (above a minimum accepted value) are required for this calculation.   Assessment: 62 yo s/p Bilateral TKA on 6/25 on coumadin for VTE prophylaxic.  Warfarin started 6/26. INR today is 2.09, at goal.   Warfarin education completed 6/28 pm with teach-back.  Goal of Therapy:  INR 2-3   Plan:  Coumadin 7.5mg  po x1 tonight D/C Lovenox Daily PT/INR  Wendie Simmer, PharmD, BCPS Clinical Pharmacist  Pager: [phone]

## 2013-03-25 NOTE — Progress Notes (Signed)
Patient ID: Jennifer Sanford, female   DOB: [birth year], 62 y.o.   MRN: [id number] 62 y.o. right-handed female admitted 03/16/2013 with end stage changes bilateral knees and no relief with conservative care. 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 Bilateral total knee arthroplasty 03/17/2013 per Dr.Alusio. Placed on Coumadin for DVT prophylaxis with subcutaneous Lovenox for INR greater than 2.00. Advised weightbearing as tolerated. Acute blood loss anemia latest hemoglobin 7.2 and transfused with latest hemoglobin 9.4 and monitored. Postoperative pain control with epidural removed 03/19/2013   Subjective/Complaints:  Pain control is ok No bowel issues    Objective: Vital Signs: Blood pressure 116/62, pulse 85, temperature 98.5 F (36.9 C), temperature source Oral, resp. rate 17, weight 108.228 kg (238 lb 9.6 oz), SpO2 97.00%. No results found. Results for orders placed during the hospital encounter of 03/22/13 (from the past 72 hour(s))  CBC WITH DIFFERENTIAL     Status: Abnormal   Collection Time    03/23/13  5:30 AM      Result Value Range   WBC 13.3 (*) 4.0 - 10.5 K/uL   RBC 3.11 (*) 3.87 - 5.11 MIL/uL   Hemoglobin 9.3 (*) 12.0 - 15.0 g/dL   HCT 78.2 (*) 95.6 - 21.3 %   MCV 88.4  78.0 - 100.0 fL   MCH 29.9  26.0 - 34.0 pg   MCHC 33.8  30.0 - 36.0 g/dL   RDW 08.6  57.8 - 46.9 %   Platelets 262  150 - 400 K/uL   Neutrophils Relative % 70  43 - 77 %   Neutro Abs 9.3 (*) 1.7 - 7.7 K/uL   Lymphocytes Relative 18  12 - 46 %   Lymphs Abs 2.3  0.7 - 4.0 K/uL   Monocytes Relative 11  3 - 12 %   Monocytes Absolute 1.4 (*) 0.1 - 1.0 K/uL   Eosinophils Relative 1  0 - 5 %   Eosinophils Absolute 0.2  0.0 - 0.7 K/uL   Basophils Relative 0  0 - 1 %   Basophils Absolute 0.0  0.0 - 0.1 K/uL  COMPREHENSIVE METABOLIC PANEL     Status: Abnormal   Collection Time    03/23/13  5:30 AM      Result Value Range   Sodium 137  135 - 145 mEq/L   Potassium 3.7  3.5 - 5.1  mEq/L   Chloride 98  96 - 112 mEq/L   CO2 30  19 - 32 mEq/L   Glucose, Bld 114 (*) 70 - 99 mg/dL   BUN 13  6 - 23 mg/dL   Creatinine, Ser 6.29  0.50 - 1.10 mg/dL   Calcium 8.5  8.4 - 52.8 mg/dL   Total Protein 6.0  6.0 - 8.3 g/dL   Albumin 2.1 (*) 3.5 - 5.2 g/dL   AST 46 (*) 0 - 37 U/L   ALT 73 (*) 0 - 35 U/L   Alkaline Phosphatase 126 (*) 39 - 117 U/L   Total Bilirubin 0.8  0.3 - 1.2 mg/dL   GFR calc non Af Amer 78 (*) >90 mL/min   GFR calc Af Amer >90  >90 mL/min   Comment:            The eGFR has been calculated     using the CKD EPI equation.     This calculation has not been     validated in all clinical     situations.  eGFR's persistently     <90 mL/min signify     possible Chronic Kidney Disease.  PROTIME-INR     Status: Abnormal   Collection Time    03/23/13  5:30 AM      Result Value Range   Prothrombin Time 17.8 (*) 11.6 - 15.2 seconds   INR 1.51 (*) 0.00 - 1.49  URINALYSIS, ROUTINE W REFLEX MICROSCOPIC     Status: Abnormal   Collection Time    03/23/13 11:40 AM      Result Value Range   Color, Urine YELLOW  YELLOW   APPearance CLEAR  CLEAR   Specific Gravity, Urine 1.018  1.005 - 1.030   pH 7.0  5.0 - 8.0   Glucose, UA NEGATIVE  NEGATIVE mg/dL   Hgb urine dipstick NEGATIVE  NEGATIVE   Bilirubin Urine NEGATIVE  NEGATIVE   Ketones, ur NEGATIVE  NEGATIVE mg/dL   Protein, ur 30 (*) NEGATIVE mg/dL   Urobilinogen, UA 1.0  0.0 - 1.0 mg/dL   Nitrite NEGATIVE  NEGATIVE   Leukocytes, UA SMALL (*) NEGATIVE  URINE MICROSCOPIC-ADD ON     Status: None   Collection Time    03/23/13 11:40 AM      Result Value Range   Squamous Epithelial / LPF RARE  RARE   WBC, UA 7-10  <3 WBC/hpf   Bacteria, UA RARE  RARE  PROTIME-INR     Status: Abnormal   Collection Time    03/24/13  6:25 AM      Result Value Range   Prothrombin Time 20.4 (*) 11.6 - 15.2 seconds   INR 1.80 (*) 0.00 - 1.49  PROTIME-INR     Status: Abnormal   Collection Time    03/25/13  5:48 AM       Result Value Range   Prothrombin Time 22.8 (*) 11.6 - 15.2 seconds   INR 2.09 (*) 0.00 - 1.49     HEENT: normal Cardio: RRR Resp: CTA B/L GI: BS positive Extremity:  Edema pretibial 1+ Skin:   Intact and Wound C/D/I Neuro: Alert/Oriented Musc/Skel:  Swelling bilateral knees, bilateral lower ext edema, quads and gastrocs sore to palpation Gen NAD   Assessment/Plan: 1. Functional deficits secondary to B TKR which require 3+ hours per day of interdisciplinary therapy in a comprehensive inpatient rehab setting. Physiatrist is providing close team supervision and 24 hour management of active medical problems listed below. Physiatrist and rehab team continue to assess barriers to discharge/monitor patient progress toward functional and medical goals. FIM: FIM - Bathing Bathing Steps Patient Completed: Chest;Right Arm;Left Arm;Abdomen;Front perineal area;Buttocks;Right upper leg;Left upper leg Bathing: 4: Min-Patient completes 8-9 80f 10 parts or 75+ percent  FIM - Upper Body Dressing/Undressing Upper body dressing/undressing steps patient completed: Thread/unthread right bra strap;Thread/unthread left bra strap;Hook/unhook bra;Thread/unthread right sleeve of pullover shirt/dresss;Thread/unthread left sleeve of pullover shirt/dress;Put head through opening of pull over shirt/dress;Pull shirt over trunk Upper body dressing/undressing: 5: Set-up assist to: Obtain clothing/put away FIM - Lower Body Dressing/Undressing Lower body dressing/undressing steps patient completed: Pull pants up/down Lower body dressing/undressing: 2: Max-Patient completed 25-49% of tasks  FIM - Toileting Toileting steps completed by patient: Adjust clothing prior to toileting;Performs perineal hygiene;Adjust clothing after toileting Toileting Assistive Devices: Grab bar or rail for support Toileting: 4: Steadying assist  FIM - Diplomatic Services operational officer Devices: Bedside commode;Walker;Grab  bars Toilet Transfers: 4-To toilet/BSC: Min A (steadying Pt. > 75%);4-From toilet/BSC: Min A (steadying Pt. > 75%)  FIM - Bed/Chair Transfer Bed/Chair  Transfer Assistive Devices: Environmental consultant;Arm rests Bed/Chair Transfer: 1: Two helpers (one  person to stabilize RW)  FIM - Locomotion: Wheelchair Locomotion: Wheelchair: 5: Travels 150 ft or more: maneuvers on rugs and over door sills with supervision, cueing or coaxing FIM - Locomotion: Ambulation Locomotion: Ambulation Assistive Devices: Walker - Rolling;Orthosis (bil KIs) Ambulation/Gait Assistance: 4: Min guard Locomotion: Ambulation: 2: Travels 50 - 149 ft with minimal assistance (Pt.>75%) (85')  Comprehension Comprehension Mode: Auditory Comprehension: 7-Follows complex conversation/direction: With no assist  Expression Expression Mode: Verbal Expression: 7-Expresses complex ideas: With no assist  Social Interaction Social Interaction: 7-Interacts appropriately with others - No medications needed.  Problem Solving Problem Solving: 6-Solves complex problems: With extra time  Memory Memory: 6-More than reasonable amt of time  Medical Problem List and Plan:  1. 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 DVT Prophylaxis/Anticoagulation: Coumadin for DVT prophylaxis. Continue Lovenox for INR greater than 2.00. Monitor for any bleeding episodes. Check vascular study  3. Pain Management: Hydrocodone/Robaxin as needed. Monitor with increased mobility  4. Mood/depression. Celexa 10 mg daily. Provide emotional support  5. Neuropsych: This patient is capable of making decisions on her own behalf.  6. Postoperative anemia. Patient has been transfused. Continue iron supplement. Followup CBC  7. Hypertension. Maxzide daily. Monitor the increased mobility  8. Hyperlipidemia. Lipitor  9. GERD. Protonix  10.  Leukocytosis no fever, wound looks good, monitor,  UA- 11.  Increased AST,ALT,  likely tylenol will change to Oxy IR, D/C tylenol  LOS (Days) 3 A FACE TO FACE EVALUATION WAS PERFORMED  Marlies Ligman E 03/25/2013, 8:42 AM

## 2013-03-26 ENCOUNTER — Inpatient Hospital Stay (HOSPITAL_COMMUNITY): Payer: 59 | Admitting: Occupational Therapy

## 2013-03-26 ENCOUNTER — Inpatient Hospital Stay (HOSPITAL_COMMUNITY): Payer: 59 | Admitting: *Deleted

## 2013-03-26 LAB — PROTIME-INR
INR: 2.09 — ABNORMAL HIGH (ref 0.00–1.49)
Prothrombin Time: 22.8 seconds — ABNORMAL HIGH (ref 11.6–15.2)

## 2013-03-26 MED ORDER — WARFARIN SODIUM 7.5 MG PO TABS
7.5000 mg | ORAL_TABLET | Freq: Once | ORAL | Status: AC
  Administered 2013-03-26: 7.5 mg via ORAL
  Filled 2013-03-26: qty 1

## 2013-03-26 NOTE — Progress Notes (Signed)
Occupational Therapy Session Note  Patient Details  Name: Jennifer Sanford MRN: [id number] Date of Birth: 1951/09/18  Today's Date: 03/26/2013 Time: 0900-1000 and 105-200 Time Calculation (min): 60 min and 55 min  Short Term Goals: Week 1:  OT Short Term Goal 1 (Week 1): Pt will complete sit to stand from toilet with min assist to increase independence with toileting. OT Short Term Goal 2 (Week 1): Pt will don pants over feet with min assist. OT Short Term Goal 3 (Week 1): Pt will be able to transfer to a shower bench with min assist.  Skilled Therapeutic Interventions/Progress Updates:  1)  Patient in bed upon arrival with friend at her side.  Engaged in self care retraining to include shower, dress and groom.  Focused session on bed mobility, sit>stand from 30" height bed, ambulate with RW and BKI in place, toilet and shower transfers, lateral leans while in shower to wash bottom, sat on raised 3 in 1 to dress using reacher and LH shoe horn-similar to context of home setting, tied her right shoe lace, stood at sink for grooming tasks. Hydrocortisone cream applied to patient's back due to increased itching. Patient sitting in w/c to put on make-up with all items within reach and friend at her bedside.  2)  Patient seated in w/c eating lunch with ice on BLEs that were up on elevating leg rests (foot plate had fallen off one of them).  Pushed patient to therapy apartment to explore kitchen at a walker level and bilateral KIs in place.  Patient practiced side stepping and reaching for items in upper and lower cabinets to simulate prepare oatmeal on the stove.  Patient required seated rest break and decided to sit on kitchen stool.  Patient required steady assist of stool for stand>sit and mod assist for sit>stand due to no arm rests, swivel seat and stool without breaks so tended to slide.  Patient verbalized poor endurance and very surprized at how quickly she "gave out".  Replaced right elevating leg  rest and issued reacher and walker bag.  Therapy Documentation Precautions:  Precautions Precautions: Knee;Fall Required Braces or Orthoses: Knee Immobilizer - Right;Knee Immobilizer - Left Knee Immobilizer - Right: Discontinue once straight leg raise with < 10 degree lag Knee Immobilizer - Left: Discontinue once straight leg raise with < 10 degree lag Restrictions Weight Bearing Restrictions: Yes RLE Weight Bearing: Weight bearing as tolerated LLE Weight Bearing: Weight bearing as tolerated Other Position/Activity Restrictions: WBAT Pain: 1)  5/10 premedicated then up to 7/10 by end of session, rest repositioned  2)  10/10 at beginning of session, ice, premedicated, and repositioned and reports minimal relief at end of session. ADL: See FIM for current functional status  Therapy/Group: Individual Therapy  Cilicia Borden 03/26/2013, 11:59 AM

## 2013-03-26 NOTE — Progress Notes (Signed)
ANTICOAGULATION CONSULT NOTE   Pharmacy Consult for coumadin Indication: VTE prophylaxis  No Known Allergies  Labs:  Recent Labs  03/24/13 0625 03/25/13 0548 03/26/13 0834  LABPROT 20.4* 22.8* 22.8*  INR 1.80* 2.09* 2.09*    The CrCl is unknown because both a height and weight (above a minimum accepted value) are required for this calculation.   Assessment: 62 yo s/p Bilateral TKA on 6/25 on coumadin for VTE prophylaxic.  Warfarin started 6/26. INR today is 2.09, at goal x 2 days.  May consider decreasing frequency of INR checks if INR remains at goal 7/5. Warfarin education completed 6/28 pm with teach-back.  Goal of Therapy:  INR 2-3   Plan:  Coumadin 7.5mg  po x1 tonight Daily PT/INR  Estella Husk, Pharm.D., BCPS Clinical Pharmacist Phone: [phone] or [phone] Pager: [phone] 03/26/2013, 9:35 AM

## 2013-03-26 NOTE — Progress Notes (Signed)
Patient ID: Jennifer Sanford, female   DOB: June 07, 1951, 62 y.o.   MRN: [id number]  62 y.o. right-handed female admitted 03/16/2013 with end stage changes bilateral knees and no relief with conservative care. 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 Bilateral total knee arthroplasty 03/17/2013 per Dr.Alusio. Placed on Coumadin for DVT prophylaxis with subcutaneous Lovenox for INR greater than 2.00. Advised weightbearing as tolerated. Acute blood loss anemia latest hemoglobin 7.2 and transfused with latest hemoglobin 9.3and monitored.   Subjective/Complaints:  Pain control is ok, Knee ROM slowly improving No bowel issues    Objective: Vital Signs: Blood pressure 97/63, pulse 75, temperature 98.3 F (36.8 C), temperature source Oral, resp. rate 18, weight 108.228 kg (238 lb 9.6 oz), SpO2 99.00%. No results found. Results for orders placed during the hospital encounter of 03/22/13 (from the past 72 hour(s))  URINALYSIS, ROUTINE W REFLEX MICROSCOPIC     Status: Abnormal   Collection Time    03/23/13 11:40 AM      Result Value Range   Color, Urine YELLOW  YELLOW   APPearance CLEAR  CLEAR   Specific Gravity, Urine 1.018  1.005 - 1.030   pH 7.0  5.0 - 8.0   Glucose, UA NEGATIVE  NEGATIVE mg/dL   Hgb urine dipstick NEGATIVE  NEGATIVE   Bilirubin Urine NEGATIVE  NEGATIVE   Ketones, ur NEGATIVE  NEGATIVE mg/dL   Protein, ur 30 (*) NEGATIVE mg/dL   Urobilinogen, UA 1.0  0.0 - 1.0 mg/dL   Nitrite NEGATIVE  NEGATIVE   Leukocytes, UA SMALL (*) NEGATIVE  URINE MICROSCOPIC-ADD ON     Status: None   Collection Time    03/23/13 11:40 AM      Result Value Range   Squamous Epithelial / LPF RARE  RARE   WBC, UA 7-10  <3 WBC/hpf   Bacteria, UA RARE  RARE  PROTIME-INR     Status: Abnormal   Collection Time    03/24/13  6:25 AM      Result Value Range   Prothrombin Time 20.4 (*) 11.6 - 15.2 seconds   INR 1.80 (*) 0.00 - 1.49  PROTIME-INR     Status: Abnormal   Collection Time    03/25/13  5:48 AM      Result Value Range   Prothrombin Time 22.8 (*) 11.6 - 15.2 seconds   INR 2.09 (*) 0.00 - 1.49  PROTIME-INR     Status: Abnormal   Collection Time    03/26/13  8:34 AM      Result Value Range   Prothrombin Time 22.8 (*) 11.6 - 15.2 seconds   INR 2.09 (*) 0.00 - 1.49     HEENT: normal Cardio: RRR Resp: CTA B/L GI: BS positive Extremity:  Edema pretibial 1+ Skin:   Intact and Wound C/D/I Neuro: Alert/Oriented Musc/Skel:  Swelling bilateral knees, bilateral lower ext edema, quads and gastrocs sore to palpation Gen NAD   Assessment/Plan: 1. Functional deficits secondary to B TKR which require 3+ hours per day of interdisciplinary therapy in a comprehensive inpatient rehab setting. Physiatrist is providing close team supervision and 24 hour management of active medical problems listed below. Physiatrist and rehab team continue to assess barriers to discharge/monitor patient progress toward functional and medical goals. FIM: FIM - Bathing Bathing Steps Patient Completed: Chest;Right Arm;Left Arm;Front perineal area;Abdomen;Buttocks;Right upper leg;Left upper leg;Left lower leg (including foot);Right lower leg (including foot) (used long sponge) Bathing: 5: Supervision: Safety issues/verbal cues  FIM -  Upper Body Dressing/Undressing Upper body dressing/undressing steps patient completed: Thread/unthread right bra strap;Thread/unthread left bra strap;Hook/unhook bra;Thread/unthread right sleeve of pullover shirt/dresss;Thread/unthread left sleeve of pullover shirt/dress;Put head through opening of pull over shirt/dress;Pull shirt over trunk Upper body dressing/undressing: 5: Set-up assist to: Obtain clothing/put away FIM - Lower Body Dressing/Undressing Lower body dressing/undressing steps patient completed: Thread/unthread left pants leg;Thread/unthread right pants leg;Pull pants up/down (used a reacher) Lower body dressing/undressing: 3:  Mod-Patient completed 50-74% of tasks  FIM - Toileting Toileting steps completed by patient: Adjust clothing prior to toileting;Performs perineal hygiene;Adjust clothing after toileting Toileting Assistive Devices: Grab bar or rail for support Toileting: 4: Steadying assist  FIM - Diplomatic Services operational officer Devices: Bedside commode;Walker;Grab bars Toilet Transfers: 4-To toilet/BSC: Min A (steadying Pt. > 75%);4-From toilet/BSC: Min A (steadying Pt. > 75%)  FIM - Bed/Chair Transfer Bed/Chair Transfer Assistive Devices: Manufacturing systems engineer Transfer: 3: Supine > Sit: Mod A (lifting assist/Pt. 50-74%/lift 2 legs;3: Chair or W/C > Bed: Mod A (lift or lower assist)  FIM - Locomotion: Wheelchair Distance: 100 Locomotion: Wheelchair: 2: Travels 50 - 149 ft with supervision, cueing or coaxing FIM - Locomotion: Ambulation Locomotion: Ambulation Assistive Devices: Walker - Rolling;Orthosis (bil KIs) Ambulation/Gait Assistance: 4: Min guard Locomotion: Ambulation: 2: Travels 50 - 149 ft with minimal assistance (Pt.>75%)  Comprehension Comprehension Mode: Auditory Comprehension: 7-Follows complex conversation/direction: With no assist  Expression Expression Mode: Verbal Expression: 7-Expresses complex ideas: With no assist  Social Interaction Social Interaction: 6-Interacts appropriately with others with medication or extra time (anti-anxiety, antidepressant). (takes celexa)  Problem Solving Problem Solving: 7-Solves complex problems: Recognizes & self-corrects  Memory Memory: 7-Complete Independence: No helper  Medical Problem List and Plan:  1. 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 DVT Prophylaxis/Anticoagulation: Coumadin for DVT prophylaxis. Continue Lovenox for INR greater than 2.00. Monitor for any bleeding episodes. Check vascular study  3. Pain Management: Hydrocodone/Robaxin as needed. Monitor with  increased mobility  4. Mood/depression. Celexa 10 mg daily. Provide emotional support  5. Neuropsych: This patient is capable of making decisions on her own behalf.  6. Postoperative anemia. Patient has been transfused. Continue iron supplement. Followup CBC  7. Hypertension. Maxzide daily. Monitor the increased mobility  8. Hyperlipidemia. Lipitor  9. GERD. Protonix  10.  Leukocytosis no fever, wound looks good, monitor,  UA- 11.  Increased AST,ALT, likely tylenol will change to Oxy IR, D/C tylenol, f/u CMET   LOS (Days) 4 A FACE TO FACE EVALUATION WAS PERFORMED  Daje Stark E 03/26/2013, 10:42 AM

## 2013-03-26 NOTE — Progress Notes (Signed)
Occupational Therapy Session Note  Patient Details  Name: EDY MCBANE MRN: [id number] Date of Birth: 06/20/1951  Today's Date: 03/25/2013 Time: 250-345 55 min  Short Term Goals: Week 1:  OT Short Term Goal 1 (Week 1): Pt will complete sit to stand from toilet with min assist to increase independence with toileting. OT Short Term Goal 2 (Week 1): Pt will don pants over feet with min assist. OT Short Term Goal 3 (Week 1): Pt will be able to transfer to a shower bench with min assist.  Skilled Therapeutic Interventions/Progress Updates:  Engaged in bed mobility, bed and toilet transfers, and discussed shower options as they relate to her home environment.  Focused session on sit><stand, standing tolerance, ambulate to and from bathroom, adjustments made to bilateral knee immobilizers and repositioned.  Patient hopes to use her walk in shower with sliding doors and built in seat.  This setting does not have grab bars therefore, patient considering having some installed.  She likes the idea of backing into the shower with RW and up over shower ledge.  Patient reports that if she cannot make the shower stall option work, she has a tub/shower combo as an option if needed and will need to get a tub bench.  Patient's daughter and friend present during session to offer suggestions.  Therapy Documentation Precautions:  Precautions Precautions: Knee;Fall Required Braces or Orthoses: Knee Immobilizer - Right;Knee Immobilizer - Left Knee Immobilizer - Right: Discontinue once straight leg raise with < 10 degree lag Knee Immobilizer - Left: Discontinue once straight leg raise with < 10 degree lag Restrictions Weight Bearing Restrictions: Yes RLE Weight Bearing: Weight bearing as tolerated LLE Weight Bearing: Weight bearing as tolerated Other Position/Activity Restrictions: WBAT Pain: 3/10 bilateral knee pain, pre-medicated.  Therapy/Group: Individual Therapy  Sherrel Ploch 03/25/2013

## 2013-03-26 NOTE — Progress Notes (Signed)
Physical Therapy Session Note  Patient Details  Name: Jennifer Sanford MRN: [id number] Date of Birth: 25-Oct-1950  Today's Date: 03/26/2013 Time: 1032-1203 Time Calculation (min): 91 min  Short Term Goals: Week 1:  PT Short Term Goal 1 (Week 1): Pt will perform bed mobility with Min A  PT Short Term Goal 2 (Week 1): Pt will perform bed<>chair transfers with Min A PT Short Term Goal 3 (Week 1): Pt will ambulate 28' with RW and S PT Short Term Goal 4 (Week 1): Pt will obtain 50 degrees knee ROM bilaterally from supine position PT Short Term Goal 5 (Week 1): Pt will perform 6 stairs with bil rails and min A   Skilled Therapeutic Interventions/Progress Updates:  Tx focused on gait, therex for LE strengthening, and transfer training.  Sit<>stand transfers x4 throughout tx with Min A for steadying primarily. Continues to "vault" over straight knees with KIs.  Gait in controlled environment 1x120' with min-guard and self-correcting decreased UE reliance.   WC<>mat with Min A for safety.  Sit>supine with Mod A. Supine > Sit with Min A only to help lower LEs to floor.   Supine therex: ankle pumps x20, quad setsx10, SAQ x10, heel slides with sheet and maxislide x10 with overpressure and 30 sec hold for last 2 each side, hip ABD with maxislide x20, SLR with AAROM x10. Seated edge of mat dangling with AA HS curl in small range.   Supine flexion measurements: R 60 degrees, L 55 degrees.   Standing in //bars: static stance without UE for prop x60sec, marching, heel raises, HS curls x10 each bilaterally. Walked in bars with upright posture and decreased UE reliance.   Nustep x104min for increased knee ROM with bil UE and LEs. Pt had increased discomfort with getting onto Nustep due to knee flexion required, and was tearful.       Therapy Documentation Precautions:  Precautions Precautions: Knee;Fall Required Braces or Orthoses: Knee Immobilizer - Right;Knee Immobilizer - Left Knee Immobilizer -  Right: Discontinue once straight leg raise with < 10 degree lag Knee Immobilizer - Left: Discontinue once straight leg raise with < 10 degree lag Restrictions Weight Bearing Restrictions: Yes RLE Weight Bearing: Weight bearing as tolerated LLE Weight Bearing: Weight bearing as tolerated Other Position/Activity Restrictions: WBAT    Pain:  Fluctuated throughout tx, modified position and activity prn. Pt premedicated. Nursing bringing ice after. Locomotion : Ambulation Ambulation/Gait Assistance: 4: Min guard   See FIM for current functional status  Therapy/Group: Individual Therapy  Clydene Laming, PT, DPT  03/26/2013, 12:11 PM

## 2013-03-27 ENCOUNTER — Encounter (HOSPITAL_COMMUNITY): Payer: 59 | Admitting: Occupational Therapy

## 2013-03-27 ENCOUNTER — Inpatient Hospital Stay (HOSPITAL_COMMUNITY): Payer: 59 | Admitting: Physical Therapy

## 2013-03-27 ENCOUNTER — Inpatient Hospital Stay (HOSPITAL_COMMUNITY): Payer: 59 | Admitting: Occupational Therapy

## 2013-03-27 DIAGNOSIS — R7989 Other specified abnormal findings of blood chemistry: Secondary | ICD-10-CM

## 2013-03-27 DIAGNOSIS — E871 Hypo-osmolality and hyponatremia: Secondary | ICD-10-CM

## 2013-03-27 DIAGNOSIS — Z96659 Presence of unspecified artificial knee joint: Secondary | ICD-10-CM

## 2013-03-27 LAB — PROTIME-INR
INR: 2.59 — ABNORMAL HIGH (ref 0.00–1.49)
Prothrombin Time: 26.9 seconds — ABNORMAL HIGH (ref 11.6–15.2)

## 2013-03-27 MED ORDER — HYDROCORTISONE 2.5 % RE CREA
TOPICAL_CREAM | Freq: Four times a day (QID) | RECTAL | Status: DC
  Administered 2013-03-27: 20:00:00 via RECTAL
  Filled 2013-03-27: qty 28.35

## 2013-03-27 MED ORDER — HYDROCORTISONE 2.5 % RE CREA
TOPICAL_CREAM | Freq: Four times a day (QID) | RECTAL | Status: DC | PRN
  Administered 2013-03-27: 18:00:00 via RECTAL
  Filled 2013-03-27: qty 28.35

## 2013-03-27 MED ORDER — WARFARIN SODIUM 2.5 MG PO TABS
2.5000 mg | ORAL_TABLET | Freq: Once | ORAL | Status: AC
  Administered 2013-03-27: 2.5 mg via ORAL
  Filled 2013-03-27: qty 1

## 2013-03-27 NOTE — Progress Notes (Signed)
Nursing Note: Pt tolerated 2 hours per leg in [facility] [place]. Uses ice prn.wbb

## 2013-03-27 NOTE — Progress Notes (Addendum)
Jennifer Sanford is a 62 y.o. female October 08, 1950 [id number]  Subjective: No new complaints. No new problems. Slept well. Feeling OK.  Objective: Vital signs in last 24 hours: Temp:  [98.2 F (36.8 C)-98.9 F (37.2 C)] 98.2 F (36.8 C) (07/05 0601) Pulse Rate:  [91] 91 (07/05 0601) Resp:  [20] 20 (07/05 0601) BP: (101-109)/(67-72) 101/67 mmHg (07/05 0601) SpO2:  [96 %-97 %] 96 % (07/05 0601) Weight change:  Last BM Date: 03/25/13  Intake/Output from previous day: 07/04 0701 - 07/05 0700 In: 1080 [P.O.:1080] Out: -  Last cbgs: CBG (last 3)  No results found for this basename: GLUCAP,  in the last 72 hours   Physical Exam General: No apparent distress    HEENT: moist mucosa Lungs: Normal effort. Lungs clear to auscultation, no crackles or wheezes. Cardiovascular: Regular rate and rhythm, no edema Musculoskeletal:  No change from before Neurological: No new neurological deficits Wounds: N/A    Skin: clear Alert, cooperative   Lab Results: BMET    Component Value Date/Time   NA 137 03/23/2013 0530   K 3.7 03/23/2013 0530   CL 98 03/23/2013 0530   CO2 30 03/23/2013 0530   GLUCOSE 114* 03/23/2013 0530   BUN 13 03/23/2013 0530   CREATININE 0.81 03/23/2013 0530   CALCIUM 8.5 03/23/2013 0530   GFRNONAA 78* 03/23/2013 0530   GFRAA >90 03/23/2013 0530   CBC    Component Value Date/Time   WBC 13.3* 03/23/2013 0530   RBC 3.11* 03/23/2013 0530   HGB 9.3* 03/23/2013 0530   HCT 27.5* 03/23/2013 0530   PLT 262 03/23/2013 0530   MCV 88.4 03/23/2013 0530   MCH 29.9 03/23/2013 0530   MCHC 33.8 03/23/2013 0530   RDW 14.8 03/23/2013 0530   LYMPHSABS 2.3 03/23/2013 0530   MONOABS 1.4* 03/23/2013 0530   EOSABS 0.2 03/23/2013 0530   BASOSABS 0.0 03/23/2013 0530    Studies/Results: No results found.  Medications: I have reviewed the patient's current medications.  Assessment/Plan:   1. 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 DVT  Prophylaxis/Anticoagulation: Coumadin for DVT prophylaxis. Continue Lovenox for INR greater than 2.00. Monitor for any bleeding episodes. Check vascular study  3. Pain Management: Hydrocodone/Robaxin as needed. Monitor with increased mobility  4. Mood/depression. Celexa 10 mg daily. Provide emotional support  5. Neuropsych: This patient is capable of making decisions on her own behalf.  6. Postoperative anemia. Patient has been transfused. Continue iron supplement. Followup CBC  7. Hypertension. Maxzide daily. Monitor the increased mobility  8. Hyperlipidemia. Lipitor  9. GERD. Protonix  10. Leukocytosis no fever, wound looks good, monitor, UA-  11. Increased AST,ALT, likely tylenol will change to Oxy IR, D/C tylenol, f/u CMET - ordered 12. Hyponatremia - labs     Length of stay, days: 5  Sonda Primes , MD 03/27/2013, 8:25 AM

## 2013-03-27 NOTE — Progress Notes (Signed)
ANTICOAGULATION CONSULT NOTE   Pharmacy Consult for coumadin Indication: VTE prophylaxis  No Known Allergies  Labs:  Recent Labs  03/25/13 0548 03/26/13 0834 03/27/13 0525  LABPROT 22.8* 22.8* 26.9*  INR 2.09* 2.09* 2.59*    The CrCl is unknown because both a height and weight (above a minimum accepted value) are required for this calculation.   Assessment: 62 yo s/p Bilateral TKA on 6/25 on coumadin for VTE prophylaxic.  Warfarin started 6/26. INR today is 2.59, which remains therapeutic but with a sharp increase from 7/4. Warfarin education completed 6/28 pm with teach-back.  Goal of Therapy:  INR 2-3   Plan:  Decrease Coumadin to 2.5mg  today Daily PT/INR  Estella Husk, Pharm.D., BCPS, AAHIVP Clinical Pharmacist Phone: [phone] or [phone] 03/27/2013, 8:40 AM

## 2013-03-27 NOTE — Progress Notes (Signed)
Occupational Therapy Session Note  Patient Details  Name: Jennifer Sanford MRN: [id number] Date of Birth: 07/21/1951  Today's Date: 03/27/2013 Time: 1345-1430 Time Calculation (min): 45 min  Short Term Goals: Week 1:  OT Short Term Goal 1 (Week 1): Pt will complete sit to stand from toilet with min assist to increase independence with toileting. OT Short Term Goal 2 (Week 1): Pt will don pants over feet with min assist. OT Short Term Goal 3 (Week 1): Pt will be able to transfer to a shower bench with min assist.  Skilled Therapeutic Interventions/Progress Updates:    Patient seen this pm for 1:1 OT session to address additional self care skills.  Patient issued elastic shoelaces.  Practiced tub bench transfers with knee immobilizers on for stepping, then knee immobilizers off for transitioning LE's into the tub.  Patient required mod assist to stand from seated position on edge of tub seat.    Therapy Documentation Precautions:  Precautions Precautions: Knee;Fall Required Braces or Orthoses: Knee Immobilizer - Right;Knee Immobilizer - Left Knee Immobilizer - Right: Discontinue once straight leg raise with < 10 degree lag Knee Immobilizer - Left: Discontinue once straight leg raise with < 10 degree lag Restrictions Weight Bearing Restrictions: Yes RLE Weight Bearing: Weight bearing as tolerated LLE Weight Bearing: Weight bearing as tolerated Other Position/Activity Restrictions: WBAT   Pain: 10/10  RN notified, pain medicine delivered during this session  See FIM for current functional status  Therapy/Group: Individual Therapy  Collier Salina 03/27/2013, 3:37 PM

## 2013-03-27 NOTE — Progress Notes (Signed)
Physical Therapy Note  Patient Details  Name: Jennifer Sanford MRN: [id number] Date of Birth: 09-13-1951 Today's Date: 03/27/2013  [id number] (55 minutes) individual Pain: 6/10 bilateral knees/ premedicated Focus of treatment: Therapeutic exercises focused on functional AROM (knees)/strengthening bilateral LEs; gait training Treatment: Pt in wc upon arrival with bilateral knee immobilizers; wc mobility - SBA on unit 120 feet level surfaces x 2; sit to stand from wc SBA; sit to supine (mat) using sheet to self assist bilateral LEs - min/mod assist LEs ; Therapeutic exercise in supine - AA bilateral knee flexion; hip/knee flexion using therapy ball; sitting edge of mat performing assistive knee flexion (reciprocal inhibition); sit to stand from raised mat maintaining partial bilateral knee flexion 2 X 5; gait from gym to room 120 feet SBA with bilateral knee immobilizers WBAT.   1530-1555 (25 minutes) individual Pain: 6/10 bilateral knees/ premedicated Focus of treatment: therapeutic exercise focused on AROM bilateral knees  Treatment: Pt seated in wc- AAROM bilateral knees using orange theraband to resist knee flexion. Pt instructed in placing both feet on floor and bringing wc forward in increments to facilitate slow stretch of knee extensors. Pt returned to room with ice packs on both knees.   Antwoin Lackey,JIM 03/27/2013, 8:57 AM

## 2013-03-27 NOTE — Progress Notes (Signed)
Occupational Therapy Session Note  Patient Details  Name: Jennifer Sanford MRN: [id number] Date of Birth: May 05, 1951  Today's Date: 03/27/2013 Time: 0800-0900 Time Calculation (min): 60 min  Short Term Goals: Week 1:  OT Short Term Goal 1 (Week 1): Pt will complete sit to stand from toilet with min assist to increase independence with toileting. OT Short Term Goal 2 (Week 1): Pt will don pants over feet with min assist. OT Short Term Goal 3 (Week 1): Pt will be able to transfer to a shower bench with min assist.  Skilled Therapeutic Interventions/Progress Updates:    Patient seen this am for 1:1 OT session to address functional mobility during self care tasks.  Patient able to direct this caregiver to process used to complete bed mobility, transfers, and and bathroom routine.  Patient very motivated for increaased independence, and with additional time, able to complete all transfers with close supervision.  Patient practiced use of long handled shoe horn to don shoes.  Patient able to reach feet to tie shoes.  Patient concerned regarding her big toes on each foot, being slightly discolored.  Left great toe had darkened nail bed (left aspect of bed).  RN informed, and she indicated she would let the MD know.  Daughter present for ADL, and witnessed use of reacher (pants) and long handled shoe horn / reacher to don shoes.  Daughter very attentive, and encouraging.  Therapy Documentation Precautions:  Precautions Precautions: Knee;Fall Required Braces or Orthoses: Knee Immobilizer - Right;Knee Immobilizer - Left Knee Immobilizer - Right: Discontinue once straight leg raise with < 10 degree lag Knee Immobilizer - Left: Discontinue once straight leg raise with < 10 degree lag Restrictions Weight Bearing Restrictions: Yes RLE Weight Bearing: Weight bearing as tolerated LLE Weight Bearing: Weight bearing as tolerated Other Position/Activity Restrictions: WBAT   Pain: 5/10 at onset.  Called for  pain medicine, medicine delivered during session,  7/10 during dressing tasks in knees. See FIM for current functional status  Therapy/Group: Individual Therapy  Collier Salina 03/27/2013, 12:33 PM

## 2013-03-28 ENCOUNTER — Inpatient Hospital Stay (HOSPITAL_COMMUNITY): Payer: 59 | Admitting: Physical Therapy

## 2013-03-28 LAB — COMPREHENSIVE METABOLIC PANEL
ALT: 97 U/L — ABNORMAL HIGH (ref 0–35)
AST: 75 U/L — ABNORMAL HIGH (ref 0–37)
Albumin: 2.6 g/dL — ABNORMAL LOW (ref 3.5–5.2)
Alkaline Phosphatase: 137 U/L — ABNORMAL HIGH (ref 39–117)
BUN: 16 mg/dL (ref 6–23)
CO2: 28 mEq/L (ref 19–32)
Calcium: 9.4 mg/dL (ref 8.4–10.5)
Chloride: 97 mEq/L (ref 96–112)
Creatinine, Ser: 0.78 mg/dL (ref 0.50–1.10)
GFR calc Af Amer: >90 mL/min (ref >90)
GFR calc non Af Amer: 90 mL/min — ABNORMAL LOW (ref >90)
Glucose, Bld: 116 mg/dL — ABNORMAL HIGH (ref 70–99)
Potassium: 4.4 mEq/L (ref 3.5–5.1)
Sodium: 134 mEq/L — ABNORMAL LOW (ref 135–145)
Total Bilirubin: 0.8 mg/dL (ref 0.3–1.2)
Total Protein: 7.3 g/dL (ref 6.0–8.3)

## 2013-03-28 LAB — PROTIME-INR
INR: 2.59 — ABNORMAL HIGH (ref 0.00–1.49)
Prothrombin Time: 26.9 seconds — ABNORMAL HIGH (ref 11.6–15.2)

## 2013-03-28 MED ORDER — WARFARIN SODIUM 6 MG PO TABS
6.0000 mg | ORAL_TABLET | Freq: Once | ORAL | Status: AC
  Administered 2013-03-28: 6 mg via ORAL
  Filled 2013-03-28: qty 1

## 2013-03-28 NOTE — Progress Notes (Signed)
ANTICOAGULATION CONSULT NOTE   Pharmacy Consult for coumadin Indication: VTE prophylaxis  No Known Allergies  Labs:  Recent Labs  03/26/13 0834 03/27/13 0525 03/28/13 0530 03/28/13 0815  LABPROT 22.8* 26.9* 26.9*  --   INR 2.09* 2.59* 2.59*  --   CREATININE  --   --   --  0.78    The CrCl is unknown because both a height and weight (above a minimum accepted value) are required for this calculation.   Assessment: 62 yo s/p Bilateral TKA on 6/25 on coumadin for VTE prophylaxic.  Warfarin started 6/26. INR today is 2.59, which is stable from 7/5. Warfarin education completed 6/28 pm with teach-back.  Goal of Therapy:  INR 2-3   Plan:  Coumadin 6mg  today Daily PT/INR  Estella Husk, Pharm.D., BCPS, AAHIVP Clinical Pharmacist Phone: [phone] or [phone] 03/28/2013, 9:36 AM

## 2013-03-28 NOTE — Progress Notes (Signed)
Physical Therapy Session Note  Patient Details  Name: Jennifer Sanford MRN: [id number] Date of Birth: 27-Sep-1950  Today's Date: 03/28/2013 Time: 0800-0915 Time Calculation (min): 75 min  Short Term Goals: Week 1:  PT Short Term Goal 1 (Week 1): Pt will perform bed mobility with Min A  PT Short Term Goal 2 (Week 1): Pt will perform bed<>chair transfers with Min A PT Short Term Goal 3 (Week 1): Pt will ambulate 41' with RW and S PT Short Term Goal 4 (Week 1): Pt will obtain 50 degrees knee ROM bilaterally from supine position PT Short Term Goal 5 (Week 1): Pt will perform 6 stairs with bil rails and min A   Skilled Therapeutic Interventions/Progress Updates:  Pt was seen bedside in the am. Pt is WBAT B LEs with KIs in place. Performed LEs ther ex, 3 sets x 10 reps each, knee flex, quad sets, and AAROM knee flex-ext, performed 1 set x 10 reps SLRs. Transfers with S and rolling walker, occasional verbal cues for technique. Pt ambulated 250 feet x 2 with rolling walker and S, occasional verbal cues for technique and safety. Pt tolerated tx well with encouragement.   Therapy Documentation Precautions:  Precautions Precautions: Knee;Fall Required Braces or Orthoses: Knee Immobilizer - Right;Knee Immobilizer - Left Knee Immobilizer - Right: Discontinue once straight leg raise with < 10 degree lag Knee Immobilizer - Left: Discontinue once straight leg raise with < 10 degree lag Restrictions Weight Bearing Restrictions: Yes RLE Weight Bearing: Weight bearing as tolerated LLE Weight Bearing: Weight bearing as tolerated Other Position/Activity Restrictions: WBAT  Pain: c/o pain B knees 5/10 premedicated prior to treatment by nsg  Locomotion : Ambulation Ambulation/Gait Assistance: 5: Supervision   See FIM for current functional status  Therapy/Group: Individual Therapy  Rayford Halsted 03/28/2013, 12:54 PM

## 2013-03-28 NOTE — Progress Notes (Signed)
Jennifer Sanford is a 62 y.o. female Apr 25, 1951 [id number]  Subjective: No new complaints. No new problems. Slept well. Feeling OK.  Objective: Vital signs in last 24 hours: Temp:  [97.8 F (36.6 C)-98.3 F (36.8 C)] 98.3 F (36.8 C) (07/06 0500) Pulse Rate:  [87-89] 87 (07/06 0500) Resp:  [18-20] 18 (07/06 0500) BP: (109-121)/(71-76) 109/71 mmHg (07/06 0500) SpO2:  [97 %-100 %] 97 % (07/06 0500) Weight change:  Last BM Date: 03/26/13  Intake/Output from previous day: 07/05 0701 - 07/06 0700 In: 720 [P.O.:720] Out: -  Last cbgs: CBG (last 3)  No results found for this basename: GLUCAP,  in the last 72 hours   Physical Exam General: No apparent distress    HEENT: moist mucosa Lungs: Normal effort. Lungs clear to auscultation, no crackles or wheezes. Cardiovascular: Regular rate and rhythm, no edema Musculoskeletal:  No change from before Neurological: No new neurological deficits Wounds: N/A    Skin: clear Alert, cooperative   Lab Results: BMET    Component Value Date/Time   NA 137 03/23/2013 0530   K 3.7 03/23/2013 0530   CL 98 03/23/2013 0530   CO2 30 03/23/2013 0530   GLUCOSE 114* 03/23/2013 0530   BUN 13 03/23/2013 0530   CREATININE 0.81 03/23/2013 0530   CALCIUM 8.5 03/23/2013 0530   GFRNONAA 78* 03/23/2013 0530   GFRAA >90 03/23/2013 0530   CBC    Component Value Date/Time   WBC 13.3* 03/23/2013 0530   RBC 3.11* 03/23/2013 0530   HGB 9.3* 03/23/2013 0530   HCT 27.5* 03/23/2013 0530   PLT 262 03/23/2013 0530   MCV 88.4 03/23/2013 0530   MCH 29.9 03/23/2013 0530   MCHC 33.8 03/23/2013 0530   RDW 14.8 03/23/2013 0530   LYMPHSABS 2.3 03/23/2013 0530   MONOABS 1.4* 03/23/2013 0530   EOSABS 0.2 03/23/2013 0530   BASOSABS 0.0 03/23/2013 0530    Studies/Results: No results found.  Medications: I have reviewed the patient's current medications.  Assessment/Plan:   1. 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 DVT  Prophylaxis/Anticoagulation: Coumadin for DVT prophylaxis. Continue Lovenox for INR greater than 2.00. Monitor for any bleeding episodes. Check vascular study  3. Pain Management: Hydrocodone/Robaxin as needed. Monitor with increased mobility  4. Mood/depression. Celexa 10 mg daily. Provide emotional support  5. Neuropsych: This patient is capable of making decisions on her own behalf.  6. Postoperative anemia. Patient has been transfused. Continue iron supplement. Followup CBC  7. Hypertension. Maxzide daily. Monitor the increased mobility  8. Hyperlipidemia. Lipitor  9. GERD. Protonix  10. Leukocytosis no fever, wound looks good, monitor, UA-  11. Increased AST,ALT, likely tylenol will change to Oxy IR, D/C tylenol, f/u CMET - ordered 12. Hyponatremia - labs pending     Length of stay, days: 6  Sonda Primes , MD 03/28/2013, 8:04 AM

## 2013-03-29 ENCOUNTER — Inpatient Hospital Stay (HOSPITAL_COMMUNITY): Payer: 59 | Admitting: Occupational Therapy

## 2013-03-29 ENCOUNTER — Inpatient Hospital Stay (HOSPITAL_COMMUNITY): Payer: 59

## 2013-03-29 DIAGNOSIS — Z96659 Presence of unspecified artificial knee joint: Secondary | ICD-10-CM

## 2013-03-29 DIAGNOSIS — M1710 Unilateral primary osteoarthritis, unspecified knee: Secondary | ICD-10-CM

## 2013-03-29 DIAGNOSIS — D62 Acute posthemorrhagic anemia: Secondary | ICD-10-CM

## 2013-03-29 DIAGNOSIS — IMO0002 Reserved for concepts with insufficient information to code with codable children: Secondary | ICD-10-CM

## 2013-03-29 LAB — PROTIME-INR
INR: 2.44 — ABNORMAL HIGH (ref 0.00–1.49)
Prothrombin Time: 25.7 seconds — ABNORMAL HIGH (ref 11.6–15.2)

## 2013-03-29 MED ORDER — WARFARIN SODIUM 7.5 MG PO TABS
7.5000 mg | ORAL_TABLET | Freq: Every day | ORAL | Status: DC
  Administered 2013-03-29: 7.5 mg via ORAL
  Filled 2013-03-29 (×2): qty 1

## 2013-03-29 NOTE — Progress Notes (Signed)
Occupational Therapy Session Note  Patient Details  Name: Jennifer Sanford MRN: [id number] Date of Birth: [birth year]  Today's Date: 03/29/2013 Time: 0800-0900 and 1191-4782 Time Calculation (min): 60 min and 40 min  Short Term Goals: Week 1:  OT Short Term Goal 1 (Week 1): Pt will complete sit to stand from toilet with min assist to increase independence with toileting. OT Short Term Goal 2 (Week 1): Pt will don pants over feet with min assist. OT Short Term Goal 3 (Week 1): Pt will be able to transfer to a shower bench with min assist.  Skilled Therapeutic Interventions/Progress Updates:    Visit 1:  Pain Assessment Pain Assessment: 0-10 Pain Score: 5  Pain Type: Surgical pain Pain Location: Knee Pain Orientation: Right;Left Pain Descriptors / Indicators: Aching Pain Onset: On-going    Pt seen for BADL retraining of toileting, bathing at shower level, and dressing with a focus on sit to stand, standing balance, and management of KI and adaptive equipment.  Pt donned KI from bed level and then sat to EOB, stood, and ambulated to bathroom with supervision.  She was able to toilet and bathe with mod I.  She used AE with dressing but needs assist with TEDs and right shoe. Her daughter, [name], who will be her main caregiver at home practiced ambulating her mother out of bathroom with RW.  Her daughter is safe to assist her with going to and from the toilet with the RW.  Visit 2:  Pain in both knees 8/10.  Pt had received pain meds 2 hours prior.  Pt seen this session for caregiver training with a friend from church with toilet and tub bench transfers.  Pt worked on tub transfers by ambulating into ADL apartment and sitting down on tub bench using back of bench as a support.  She had her knee immobilizers on and we decided not to complete the full transfer as the length of the tub was not long enough to get her legs in fully.  She was also able to stand up from the bench with supervision.  She  then ambulated halfway to her room using RW. Once in room worked on toilet transfers and toileting again. Pt assisted back to bed.  Call light in reach.  Therapy Documentation Precautions:  Precautions Precautions: Knee;Fall Required Braces or Orthoses: Knee Immobilizer - Right;Knee Immobilizer - Left Knee Immobilizer - Right: Discontinue once straight leg raise with < 10 degree lag Knee Immobilizer - Left: Discontinue once straight leg raise with < 10 degree lag Restrictions Weight Bearing Restrictions: Yes RLE Weight Bearing: Weight bearing as tolerated LLE Weight Bearing: Weight bearing as tolerated Other Position/Activity Restrictions: WBAT     Pain: Pain Assessment Pain Assessment: 0-10 Pain Score: 6  (R knee; 5 L kinee; premedicated) Pain Type: Surgical pain Pain Location: Knee Pain Orientation: Right;Left Pain Descriptors / Indicators: Aching Pain Onset: On-going ADL:  See FIM for current functional status  Therapy/Group: Individual Therapy  SAGUIER,JULIA 03/29/2013, 10:54 AM

## 2013-03-29 NOTE — Progress Notes (Signed)
Physical Therapy Session Note  Patient Details  Name: Jennifer Sanford MRN: [id number] Date of Birth: April 20, 1951  Today's Date: 03/29/2013 Time: 0800-0900 Time Calculation (min): 60 min  Short Term Goals: Week 1:  PT Short Term Goal 1 (Week 1): Pt will perform bed mobility with Min A  PT Short Term Goal 2 (Week 1): Pt will perform bed<>chair transfers with Min A PT Short Term Goal 3 (Week 1): Pt will ambulate 38' with RW and S PT Short Term Goal 4 (Week 1): Pt will obtain 50 degrees knee ROM bilaterally from supine position PT Short Term Goal 5 (Week 1): Pt will perform 6 stairs with bil rails and min A   Skilled Therapeutic Interventions/Progress Updates:  Treatment focused on transfers, gait, flexibility and strength bil LEs.  SLR R and L require assistance; pt continues to have > 10 degrees extensor lag when attempting this.  Pt achieved 62 degrees R and 55 degrees L active and passive flexion of knees.  Sit>< stand and gait x 186' with RW, bil KIs, supervision, occasional cues for upright posture and forward gaze.  Velocity and fluidity of gait improved since last week.  Therapeutic exercise using Maxislide low friction material, performed with LE to increase strength and flexibilty in sitting and supine, for functional mobility.     Therapy Documentation Precautions:  Precautions Precautions: Knee;Fall Required Braces or Orthoses: Knee Immobilizer - Right;Knee Immobilizer - Left Knee Immobilizer - Right: Discontinue once straight leg raise with < 10 degree lag Knee Immobilizer - Left: Discontinue once straight leg raise with < 10 degree lag Restrictions Weight Bearing Restrictions: Yes RLE Weight Bearing: Weight bearing as tolerated LLE Weight Bearing: Weight bearing as tolerated Other Position/Activity Restrictions: WBAT   Pain: Pain Assessment Pain Assessment: 0-10 Pain Score: R knee 6; left knee 5  Pain Type: Surgical pain Pain Location: Knee Pain Orientation:  Right;Left Pain Descriptors / Indicators: Aching Pain Onset: On-going Premedicated at start of tx   Locomotion : Ambulation Ambulation/Gait Assistance: 5: Supervision     See FIM for current functional status  Therapy/Group: Individual Therapy  Lajoy Vanamburg 03/29/2013, 12:25 PM

## 2013-03-29 NOTE — Progress Notes (Signed)
Occupational Therapy Session Note  Patient Details  Name: Jennifer Sanford MRN: [id number] Date of Birth: 21-Mar-1951  Today's Date: 03/29/2013 Time: 4098-1191 Time Calculation (min): 60 min  Short Term Goals: Week 1:  OT Short Term Goal 1 (Week 1): Pt will complete sit to stand from toilet with min assist to increase independence with toileting. OT Short Term Goal 2 (Week 1): Pt will don pants over feet with min assist. OT Short Term Goal 3 (Week 1): Pt will be able to transfer to a shower bench with min assist.  Skilled Therapeutic Interventions/Progress Updates:  Patient in bed upon arrival with family and friend at her side.  Engaged in bed mobility, practiced options to don shoes with elastic laces using reacher and LH shoe horn, sit><stand from various heights, ambulate to nurses station before request to sit due to pain/catch in her left lower back area.  Patient transferred with RW w/c><therapy mat to engage in BLE exercises and doff shoes.    Therapy Documentation Precautions:  Precautions Precautions: Knee;Fall Required Braces or Orthoses: Knee Immobilizer - Right;Knee Immobilizer - Left Knee Immobilizer - Right: Discontinue once straight leg raise with < 10 degree lag Knee Immobilizer - Left: Discontinue once straight leg raise with < 10 degree lag Restrictions Weight Bearing Restrictions: Yes RLE Weight Bearing: Weight bearing as tolerated LLE Weight Bearing: Weight bearing as tolerated Other Position/Activity Restrictions: WBAT Pain: Pain Assessment Pain Assessment: 0-10 Pain Score: 8  Pain Type: Surgical pain Pain Location: Knee Pain Orientation: Right;Left Pain Descriptors / Indicators: Aching Pain Onset: On-going Patients Stated Pain Goal: 3 Pain Intervention(s): Medication (See eMAR)  Therapy/Group: Individual Therapy  Alexsandro Salek 03/29/2013, 3:58 PM

## 2013-03-29 NOTE — Progress Notes (Signed)
ANTICOAGULATION CONSULT NOTE   Pharmacy Consult for coumadin Indication: VTE prophylaxis  No Known Allergies  Labs:  Recent Labs  03/27/13 0525 03/28/13 0530 03/28/13 0815 03/29/13 0520  LABPROT 26.9* 26.9*  --  25.7*  INR 2.59* 2.59*  --  2.44*  CREATININE  --   --  0.78  --     The CrCl is unknown because both a height and weight (above a minimum accepted value) are required for this calculation.   Assessment: 62 yo s/p Bilateral TKA on 6/25 on coumadin for VTE prophylaxic.  Warfarin started 6/26. INR today is 2.44, which is stable and therapeutic. Warfarin education completed 6/28 pm with teach-back.  Goal of Therapy:  INR 2-3   Plan:  Coumadin 7.5 mg daily Daily PT/INR  Jennifer Sanford, PharmD, BCPS  Clinical Pharmacist  Pager: [phone]   03/29/2013, 11:17 AM

## 2013-03-29 NOTE — Progress Notes (Signed)
Patient ID: Jennifer Sanford, female   DOB: [birth year], 62 y.o.   MRN: [id number]  62 y.o. right-handed female admitted 03/16/2013 with end stage changes bilateral knees and no relief with conservative care. 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 Bilateral total knee arthroplasty 03/17/2013 per Dr.Alusio. Placed on Coumadin for DVT prophylaxis with subcutaneous Lovenox for INR greater than 2.00. Advised weightbearing as tolerated. Acute blood loss anemia latest hemoglobin 7.2 and transfused with latest hemoglobin 9.3and monitored.   Subjective/Complaints:  Pain control is ok, Knee ROM slowly improving No bowel issues    Objective: Vital Signs: Blood pressure 105/67, pulse 85, temperature 98.2 F (36.8 C), temperature source Oral, resp. rate 19, weight 108.228 kg (238 lb 9.6 oz), SpO2 97.00%. No results found. Results for orders placed during the hospital encounter of 03/22/13 (from the past 72 hour(s))  PROTIME-INR     Status: Abnormal   Collection Time    03/27/13  5:25 AM      Result Value Range   Prothrombin Time 26.9 (*) 11.6 - 15.2 seconds   INR 2.59 (*) 0.00 - 1.49  PROTIME-INR     Status: Abnormal   Collection Time    03/28/13  5:30 AM      Result Value Range   Prothrombin Time 26.9 (*) 11.6 - 15.2 seconds   INR 2.59 (*) 0.00 - 1.49  COMPREHENSIVE METABOLIC PANEL     Status: Abnormal   Collection Time    03/28/13  8:15 AM      Result Value Range   Sodium 134 (*) 135 - 145 mEq/L   Potassium 4.4  3.5 - 5.1 mEq/L   Chloride 97  96 - 112 mEq/L   CO2 28  19 - 32 mEq/L   Glucose, Bld 116 (*) 70 - 99 mg/dL   BUN 16  6 - 23 mg/dL   Creatinine, Ser 4.09  0.50 - 1.10 mg/dL   Calcium 9.4  8.4 - 81.1 mg/dL   Total Protein 7.3  6.0 - 8.3 g/dL   Albumin 2.6 (*) 3.5 - 5.2 g/dL   AST 75 (*) 0 - 37 U/L   ALT 97 (*) 0 - 35 U/L   Alkaline Phosphatase 137 (*) 39 - 117 U/L   Total Bilirubin 0.8  0.3 - 1.2 mg/dL   GFR calc non Af Amer 90 (*) >90 mL/min   GFR calc Af Amer >90  >90 mL/min   Comment:            The eGFR has been calculated     using the CKD EPI equation.     This calculation has not been     validated in all clinical     situations.     eGFR's persistently     <90 mL/min signify     possible Chronic Kidney Disease.  PROTIME-INR     Status: Abnormal   Collection Time    03/29/13  5:20 AM      Result Value Range   Prothrombin Time 25.7 (*) 11.6 - 15.2 seconds   INR 2.44 (*) 0.00 - 1.49     HEENT: normal Cardio: RRR Resp: CTA B/L GI: BS positive Extremity:  Edema pretibial 1+ Skin:   Intact and Wound C/D/I Neuro: Alert/Oriented Musc/Skel:  Swelling bilateral knees, bilateral lower ext edema, quads and gastrocs sore to palpation Gen NAD   Assessment/Plan: 1. Functional deficits secondary to B TKR which require 3+ hours per day of interdisciplinary  therapy in a comprehensive inpatient rehab setting. Physiatrist is providing close team supervision and 24 hour management of active medical problems listed below. Physiatrist and rehab team continue to assess barriers to discharge/monitor patient progress toward functional and medical goals. FIM: FIM - Bathing Bathing Steps Patient Completed: Chest;Right Arm;Left Arm;Abdomen;Front perineal area;Buttocks;Right upper leg;Left upper leg;Right lower leg (including foot);Left lower leg (including foot) Bathing: 5: Supervision: Safety issues/verbal cues  FIM - Upper Body Dressing/Undressing Upper body dressing/undressing steps patient completed: Thread/unthread right bra strap;Thread/unthread left bra strap;Hook/unhook bra;Thread/unthread right sleeve of pullover shirt/dresss;Thread/unthread left sleeve of pullover shirt/dress;Put head through opening of pull over shirt/dress;Pull shirt over trunk Upper body dressing/undressing: 5: Set-up assist to: Obtain clothing/put away FIM - Lower Body Dressing/Undressing Lower body dressing/undressing steps patient completed:  Thread/unthread right pants leg;Thread/unthread left pants leg;Pull pants up/down;Fasten/unfasten pants;Fasten/unfasten left shoe;Fasten/unfasten right shoe (reacher, long-handled shoe horn) Lower body dressing/undressing: 3: Mod-Patient completed 50-74% of tasks  FIM - Toileting Toileting steps completed by patient: Adjust clothing prior to toileting;Performs perineal hygiene;Adjust clothing after toileting Toileting Assistive Devices: Grab bar or rail for support Toileting: 5: Supervision: Safety issues/verbal cues  FIM - Diplomatic Services operational officer Devices: Grab bars;Walker Toilet Transfers: 5-To toilet/BSC: Supervision (verbal cues/safety issues);5-From toilet/BSC: Supervision (verbal cues/safety issues)  FIM - Banker Devices: Walker (elevated bed) Bed/Chair Transfer: 5: Supine > Sit: Supervision (verbal cues/safety issues);5: Bed > Chair or W/C: Supervision (verbal cues/safety issues)  FIM - Locomotion: Wheelchair Distance: 100 Locomotion: Wheelchair: 1: Total Assistance/staff pushes wheelchair (Pt<25%) FIM - Locomotion: Ambulation Locomotion: Ambulation Assistive Devices: Designer, industrial/product Ambulation/Gait Assistance: 5: Supervision Locomotion: Ambulation: 5: Travels 150 ft or more with supervision/safety issues  Comprehension Comprehension Mode: Auditory Comprehension: 7-Follows complex conversation/direction: With no assist  Expression Expression Mode: Verbal Expression: 7-Expresses complex ideas: With no assist  Social Interaction Social Interaction: 7-Interacts appropriately with others - No medications needed.  Problem Solving Problem Solving: 7-Solves complex problems: Recognizes & self-corrects  Memory Memory: 7-Complete Independence: No helper  Medical Problem List and Plan:  1. 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 DVT  Prophylaxis/Anticoagulation: Coumadin for DVT prophylaxis. Continue Lovenox for INR greater than 2.00. Monitor for any bleeding episodes. Check vascular study  3. Pain Management: Hydrocodone/Robaxin as needed. Monitor with increased mobility  4. Mood/depression. Celexa 10 mg daily. Provide emotional support  5. Neuropsych: This patient is capable of making decisions on her own behalf.  6. Postoperative anemia. Patient has been transfused. Continue iron supplement. Followup CBC  7. Hypertension. Maxzide daily. Monitor the increased mobility  8. Hyperlipidemia. Lipitor  9. GERD. Protonix  10.  Leukocytosis no fever, wound looks good, monitor,  UA- 11.  Increased AST,ALT,increasing off acetominophen, will hold lipitor, was no Crestor at home  LOS (Days) 7 A FACE TO FACE EVALUATION WAS PERFORMED  Devanny Palecek E 03/29/2013, 9:34 AM

## 2013-03-30 ENCOUNTER — Inpatient Hospital Stay (HOSPITAL_COMMUNITY): Payer: 59 | Admitting: Occupational Therapy

## 2013-03-30 ENCOUNTER — Inpatient Hospital Stay (HOSPITAL_COMMUNITY): Payer: 59 | Admitting: Physical Therapy

## 2013-03-30 DIAGNOSIS — D62 Acute posthemorrhagic anemia: Secondary | ICD-10-CM

## 2013-03-30 DIAGNOSIS — M1710 Unilateral primary osteoarthritis, unspecified knee: Secondary | ICD-10-CM

## 2013-03-30 DIAGNOSIS — Z96659 Presence of unspecified artificial knee joint: Secondary | ICD-10-CM

## 2013-03-30 DIAGNOSIS — IMO0002 Reserved for concepts with insufficient information to code with codable children: Secondary | ICD-10-CM

## 2013-03-30 LAB — PROTIME-INR
INR: 2.64 — ABNORMAL HIGH (ref 0.00–1.49)
Prothrombin Time: 27.3 seconds — ABNORMAL HIGH (ref 11.6–15.2)

## 2013-03-30 MED ORDER — PANTOPRAZOLE SODIUM 40 MG PO TBEC
40.0000 mg | DELAYED_RELEASE_TABLET | Freq: Two times a day (BID) | ORAL | Status: DC
  Administered 2013-03-30 – 2013-04-02 (×6): 40 mg via ORAL
  Filled 2013-03-30 (×6): qty 1

## 2013-03-30 MED ORDER — WARFARIN SODIUM 5 MG PO TABS
5.0000 mg | ORAL_TABLET | Freq: Once | ORAL | Status: AC
  Administered 2013-03-30: 5 mg via ORAL
  Filled 2013-03-30: qty 1

## 2013-03-30 NOTE — Progress Notes (Signed)
ANTICOAGULATION CONSULT NOTE   Pharmacy Consult for coumadin Indication: VTE prophylaxis  No Known Allergies  Labs:  Recent Labs  03/28/13 0530 03/28/13 0815 03/29/13 0520 03/30/13 0520  LABPROT 26.9*  --  25.7* 27.3*  INR 2.59*  --  2.44* 2.64*  CREATININE  --  0.78  --   --     The CrCl is unknown because both a height and weight (above a minimum accepted value) are required for this calculation.   Assessment: 62 yo s/p Bilateral TKA on 6/25 on coumadin for VTE prophylaxic.  Warfarin started 6/26. INR today is 2.64, which is stable and therapeutic. No bleeding noted per chart.   Goal of Therapy:  INR 2-3   Plan:  Coumadin 5 mg today Daily PT/INR  Bayard Hugger, PharmD, BCPS  Clinical Pharmacist  Pager: [phone]   03/30/2013, 12:01 PM

## 2013-03-30 NOTE — Progress Notes (Signed)
Physical Therapy Session Note  Patient Details  Name: Jennifer Sanford MRN: [id number] Date of Birth: Nov 20, 1950  Today's Date: 03/30/2013 Time: 4098-1191 Time Calculation (min): 56 min  Short Term Goals: Week 1:  PT Short Term Goal 1 (Week 1): Pt will perform bed mobility with Min A  PT Short Term Goal 1 - Progress (Week 1): Met PT Short Term Goal 2 (Week 1): Pt will perform bed<>chair transfers with Min A PT Short Term Goal 2 - Progress (Week 1): Met PT Short Term Goal 3 (Week 1): Pt will ambulate 66' with RW and S PT Short Term Goal 3 - Progress (Week 1): Met PT Short Term Goal 4 (Week 1): Pt will obtain 50 degrees knee ROM bilaterally from supine position PT Short Term Goal 4 - Progress (Week 1): Met PT Short Term Goal 5 (Week 1): Pt will perform 6 stairs with bil rails and min A  PT Short Term Goal 5 - Progress (Week 1): Discontinued (comment) (she only has one STE (4"), d/c'd goal due to unrealistic.  ) Week 2:  PT Short Term Goal 1 (Week 2): STGs=LTGs  Skilled Therapeutic Interventions/Progress Updates:   Pt resting in bed; pt reporting that she will be due for pain meds at end of session.  Assisted pt with donning bilat KI.  Performed supine > sit with supervision and sit > stand from elevated bed with bilat KI min-mod A to bring COG fully over BOS.  Pt requested to use toilet.  Performed transfer to toilet with RW and min A and sit <> stand from toilet with min A; performed all other toileting tasks supervision.  Performed gait x 150' with min A and RW in controlled environment with intermittent verbal cues for upright posture.  Seated on edge of mat removed KI and had patient recline back on wedge with LE off side of mat for hip flexor and quad stretch with added PROM into knee flexion.  Able to gain PROM in RLE but unable to in LLE secondary to mm guarding and pain.  Had patient sit upright and performed active stretching of quads with HS heel slides with foot on pillow case x 8 reps  each side with assistance on LLE to minimize compensation from hip elevation.  Also performed 10 reps each LAQ.  Secondary to improved strength RLE therapist discontinued R KI; performed sit > stand from elevated mat with L KI only and min A secondary to increased ability to flex and use R knee to stand.  Performed gait back to room with L KI only and RW and min A with verbal cues for hamstring activation for knee flexion during terminal stance > swing phase.  Seated EOB pt required mod A sit > supine. Assisted with removal of KI and application of ice packs bilat knees.    Therapy Documentation Precautions:  Precautions Precautions: Knee;Fall Required Braces or Orthoses: Knee Immobilizer - Right;Knee Immobilizer - Left Knee Immobilizer - Right: Other (comment) (Discontinued) Knee Immobilizer - Left: Discontinue once straight leg raise with < 10 degree lag Restrictions Weight Bearing Restrictions: Yes RLE Weight Bearing: Weight bearing as tolerated LLE Weight Bearing: Weight bearing as tolerated Other Position/Activity Restrictions: WBAT Pain: Pain Assessment Pain Assessment: 0-10 Pain Score: 5  Pain Type: Acute pain;Surgical pain Pain Location: Knee Pain Orientation: Right;Left Pain Descriptors / Indicators: Throbbing;Sharp;Sore Pain Frequency: Constant Pain Onset: With Activity Patients Stated Pain Goal: 3 Pain Intervention(s): Other (Comment);Cold applied (RN provided pain meds as scheduled at end  of session) Multiple Pain Sites: No Locomotion : Ambulation Ambulation/Gait Assistance: 5: Supervision   See FIM for current functional status  Therapy/Group: Individual Therapy  [facility] 03/30/2013, 5:17 PM

## 2013-03-30 NOTE — Progress Notes (Signed)
Patient ID: Jennifer Sanford, female   DOB: [birth year], 62 y.o.   MRN: [id number]  62 y.o. right-handed female admitted 03/16/2013 with end stage changes bilateral knees and no relief with conservative care. 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 Bilateral total knee arthroplasty 03/17/2013 per Dr.Alusio. Placed on Coumadin for DVT prophylaxis with subcutaneous Lovenox for INR greater than 2.00. Advised weightbearing as tolerated. Acute blood loss anemia latest hemoglobin 7.2 and transfused with latest hemoglobin 9.3and monitored.   Subjective/Complaints:  Knee pain on R increased with Left knee in CPM No bowel issues    Objective: Vital Signs: Blood pressure 109/72, pulse 79, temperature 98.1 F (36.7 C), temperature source Oral, resp. rate 19, weight 108.228 kg (238 lb 9.6 oz), SpO2 97.00%. No results found. Results for orders placed during the hospital encounter of 03/22/13 (from the past 72 hour(s))  PROTIME-INR     Status: Abnormal   Collection Time    03/28/13  5:30 AM      Result Value Range   Prothrombin Time 26.9 (*) 11.6 - 15.2 seconds   INR 2.59 (*) 0.00 - 1.49  COMPREHENSIVE METABOLIC PANEL     Status: Abnormal   Collection Time    03/28/13  8:15 AM      Result Value Range   Sodium 134 (*) 135 - 145 mEq/L   Potassium 4.4  3.5 - 5.1 mEq/L   Chloride 97  96 - 112 mEq/L   CO2 28  19 - 32 mEq/L   Glucose, Bld 116 (*) 70 - 99 mg/dL   BUN 16  6 - 23 mg/dL   Creatinine, Ser 4.09  0.50 - 1.10 mg/dL   Calcium 9.4  8.4 - 81.1 mg/dL   Total Protein 7.3  6.0 - 8.3 g/dL   Albumin 2.6 (*) 3.5 - 5.2 g/dL   AST 75 (*) 0 - 37 U/L   ALT 97 (*) 0 - 35 U/L   Alkaline Phosphatase 137 (*) 39 - 117 U/L   Total Bilirubin 0.8  0.3 - 1.2 mg/dL   GFR calc non Af Amer 90 (*) >90 mL/min   GFR calc Af Amer >90  >90 mL/min   Comment:            The eGFR has been calculated     using the CKD EPI equation.     This calculation has not been     validated in all  clinical     situations.     eGFR's persistently     <90 mL/min signify     possible Chronic Kidney Disease.  PROTIME-INR     Status: Abnormal   Collection Time    03/29/13  5:20 AM      Result Value Range   Prothrombin Time 25.7 (*) 11.6 - 15.2 seconds   INR 2.44 (*) 0.00 - 1.49  PROTIME-INR     Status: Abnormal   Collection Time    03/30/13  5:20 AM      Result Value Range   Prothrombin Time 27.3 (*) 11.6 - 15.2 seconds   INR 2.64 (*) 0.00 - 1.49     HEENT: normal Cardio: RRR Resp: CTA B/L GI: BS positive Extremity:  Edema pretibial 1+ Skin:   Intact and Wound C/D/I Neuro: Alert/Oriented Musc/Skel:  Swelling bilateral knees, no erythema Gen NAD   Assessment/Plan: 1. Functional deficits secondary to B TKR which require 3+ hours per day of interdisciplinary therapy in a comprehensive inpatient  rehab setting. Physiatrist is providing close team supervision and 24 hour management of active medical problems listed below. Physiatrist and rehab team continue to assess barriers to discharge/monitor patient progress toward functional and medical goals. FIM: FIM - Bathing Bathing Steps Patient Completed: Chest;Right Arm;Left Arm;Abdomen;Front perineal area;Buttocks;Right upper leg;Left upper leg;Right lower leg (including foot);Left lower leg (including foot) Bathing: 6: More than reasonable amount of time  FIM - Upper Body Dressing/Undressing Upper body dressing/undressing steps patient completed: Thread/unthread right bra strap;Thread/unthread left bra strap;Hook/unhook bra;Thread/unthread right sleeve of pullover shirt/dresss;Thread/unthread left sleeve of pullover shirt/dress;Put head through opening of pull over shirt/dress;Pull shirt over trunk Upper body dressing/undressing: 5: Set-up assist to: Obtain clothing/put away FIM - Lower Body Dressing/Undressing Lower body dressing/undressing steps patient completed: Thread/unthread right pants leg;Thread/unthread left pants  leg;Pull pants up/down;Don/Doff left shoe Lower body dressing/undressing: 4: Min-Patient completed 75 plus % of tasks  FIM - Toileting Toileting steps completed by patient: Adjust clothing prior to toileting;Performs perineal hygiene;Adjust clothing after toileting Toileting Assistive Devices: Grab bar or rail for support Toileting: 6: More than reasonable amount of time  FIM - Diplomatic Services operational officer Devices: Art gallery manager Transfers: 5-To toilet/BSC: Supervision (verbal cues/safety issues);5-From toilet/BSC: Supervision (verbal cues/safety issues)  FIM - Banker Devices: Walker (elevated bed) Bed/Chair Transfer: 5: Chair or W/C > Bed: Supervision (verbal cues/safety issues);5: Bed > Chair or W/C: Supervision (verbal cues/safety issues);5: Supine > Sit: Supervision (verbal cues/safety issues);5: Sit > Supine: Supervision (verbal cues/safety issues)  FIM - Locomotion: Wheelchair Distance: 100 Locomotion: Wheelchair: 1: Total Assistance/staff pushes wheelchair (Pt<25%) FIM - Locomotion: Ambulation Locomotion: Ambulation Assistive Devices: Designer, industrial/product Ambulation/Gait Assistance: 5: Supervision Locomotion: Ambulation: 5: Travels 150 ft or more with supervision/safety issues  Comprehension Comprehension Mode: Auditory Comprehension: 7-Follows complex conversation/direction: With no assist  Expression Expression Mode: Verbal Expression: 7-Expresses complex ideas: With no assist  Social Interaction Social Interaction: 7-Interacts appropriately with others - No medications needed.  Problem Solving Problem Solving: 7-Solves complex problems: Recognizes & self-corrects  Memory Memory: 7-Complete Independence: No helper  Medical Problem List and Plan:  1. 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 DVT Prophylaxis/Anticoagulation: Coumadin for DVT prophylaxis.  Continue Lovenox for INR greater than 2.00. Monitor for any bleeding episodes. Check vascular study  3. Pain Management: Hydrocodone/Robaxin as needed. Monitor with increased mobility  4. Mood/depression. Celexa 10 mg daily. Provide emotional support  5. Neuropsych: This patient is capable of making decisions on her own behalf.  6. Postoperative anemia. Patient has been transfused. Continue iron supplement. Followup CBC  7. Hypertension. Maxzide daily. Monitor the increased mobility  8. Hyperlipidemia. Lipitor  9. GERD. Protonix  10.  Leukocytosis no fever, wound looks good, monitor,  UA- 11.  Increased AST,ALT,increasing off acetominophen, will hold lipitor, was on Crestor at home  LOS (Days) 8 A FACE TO FACE EVALUATION WAS PERFORMED  Cathi Hazan E 03/30/2013, 9:25 AM

## 2013-03-30 NOTE — Progress Notes (Signed)
Occupational Therapy Session Note  Patient Details  Name: Jennifer Sanford MRN: [id number] Date of Birth: 1951/08/11  Today's Date: 03/30/2013 Time: 0800-0905 and 1000-1030 Time Calculation (min): 65 min and 30 min  Short Term Goals: Week 1:  OT Short Term Goal 1 (Week 1): Pt will complete sit to stand from toilet with min assist to increase independence with toileting. OT Short Term Goal 2 (Week 1): Pt will don pants over feet with min assist. OT Short Term Goal 3 (Week 1): Pt will be able to transfer to a shower bench with min assist.  Skilled Therapeutic Interventions/Progress Updates:    Visit 1:  Pain:  7/10 in bilateral knees. Nursing provided pain medication.    Pt seen for BADL retraining of bathing at shower level, toileting, and dressing with a focus on functional mobility and use of AE.  Today pt was able to don shorts over feet without reacher.  She continues to have a great deal of knee pain and LE edema.  She did well with all of her transfers needing only supervision.  Pt transferred to w/c to prepare for PT session.  Visit 2:  Pain: 5/10 in knees.  Pt seen at w/c level for A/AROM for knee flexion and resisted knee extension with towel slides on floor.  Pt tolerated increased knee flexion today.  KI donned so pt could ambulate to bathroom. Her daughter will assist her out of bathroom and to the bed and apply ice to her knees.  Therapy Documentation Precautions:  Precautions Precautions: Knee;Fall Required Braces or Orthoses: Knee Immobilizer - Right;Knee Immobilizer - Left Knee Immobilizer - Right: Discontinue once straight leg raise with < 10 degree lag Knee Immobilizer - Left: Discontinue once straight leg raise with < 10 degree lag Restrictions Weight Bearing Restrictions: Yes RLE Weight Bearing: Weight bearing as tolerated LLE Weight Bearing: Weight bearing as tolerated Other Position/Activity Restrictions: WBAT Pain Assessment Pain Assessment: 0-10 Pain Score: 7   Pain Type: Surgical pain Pain Location: Knee Pain Orientation: Right;Left Pain Descriptors / Indicators: Sore;Spasm Pain Frequency: Constant Pain Onset: Progressive Patients Stated Pain Goal: 3 Pain Intervention(s): Medication (See eMAR) Multiple Pain Sites: No ADL:  See FIM for current functional status  Therapy/Group: Individual Therapy  Nura Cahoon 03/30/2013, 9:14 AM

## 2013-03-30 NOTE — Progress Notes (Signed)
Social Work Patient ID: Jennifer Sanford, female   DOB: 16-Aug-1951, 62 y.o.   MRN: [id number] Met with pt to discuss follow up therapies, will ned couple weeks Home Health for protimes and CPM's then to transition to OP. Pt wants to establish a routine and then will feel more comfortable with OP therapies.  [facility] aware and will be following.

## 2013-03-30 NOTE — Progress Notes (Signed)
Physical Therapy Weekly Progress Note  Patient Details  Name: Jennifer Sanford MRN: [id number] Date of Birth: 1951/05/20  Today's Date: 03/30/2013 Time: 0907-1007 Time Calculation (min): 60 min  Patient has met 4 of 4 short term goals.  D/C'd 6 stairs with rail goal due to unrealistic and not necessary. Pt has one STE 4" or less.    Patient continues to demonstrate the following deficits: decreased bil knee ROM, decreased leg strength, decreased balance, decreased awareness of precautions/DME use, decreased activity tolerance, increased pain and therefore will continue to benefit from skilled PT intervention to enhance overall performance with activity tolerance, balance, ability to compensate for deficits, functional use of  right lower extremity and left lower extremity and knowledge of precautions.  Patient progressing toward long term goals..  Continue plan of care.  PT Short Term Goals Week 1:  PT Short Term Goal 1 (Week 1): Pt will perform bed mobility with Min A  PT Short Term Goal 1 - Progress (Week 1): Met PT Short Term Goal 2 (Week 1): Pt will perform bed<>chair transfers with Min A PT Short Term Goal 2 - Progress (Week 1): Met PT Short Term Goal 3 (Week 1): Pt will ambulate 58' with RW and S PT Short Term Goal 3 - Progress (Week 1): Met PT Short Term Goal 4 (Week 1): Pt will obtain 50 degrees knee ROM bilaterally from supine position PT Short Term Goal 4 - Progress (Week 1): Met PT Short Term Goal 5 (Week 1): Pt will perform 6 stairs with bil rails and min A  PT Short Term Goal 5 - Progress (Week 1): Discontinued (comment) (she only has one STE (4"), d/c'd goal due to unrealistic.  )  Skilled Therapeutic Interventions/Progress Updates:    This session focused on sit to stand from recliner supervision, extra time needed for foot placement and for balance during transition from sit to stand with upper extremity assist.  Gait 150' with RW supervision with verbal cues for upright  posture.  Seated leg TE: heel slides on towel AAROM with therapist overpressure and 10" holds x 10 each bil.  Cues to keep hip down to limit compensations and to breathe at end ROM.  Sit to supine to mat table min assist of right leg.  Supine quad sets to encourage knee extension x10 each bil.  AA SLR x 10 each bil.  Education on knee precautions (brace use, no pillow under knees).  Sit to supine supervision.  Pt able to progress bil knees over EOB.  Sit to stand supervision from elevated surface.  Stair with RW backwards with bil knee braces min guard assist 1 step (4") x 4 practicing leading first with the right and then with the left leg to try to determine which would be easier (leading up with the left was easier).  Cues for technique, sequencing and safety.  Stand to sit into [facility] with supervision to return to room.  Daughter present observing session.   Therapy Documentation Precautions:  Precautions Precautions: Knee;Fall Required Braces or Orthoses: Knee Immobilizer - Right;Knee Immobilizer - Left Knee Immobilizer - Right: Discontinue once straight leg raise with < 10 degree lag Knee Immobilizer - Left: Discontinue once straight leg raise with < 10 degree lag Restrictions Weight Bearing Restrictions: Yes RLE Weight Bearing: Weight bearing as tolerated LLE Weight Bearing: Weight bearing as tolerated Other Position/Activity Restrictions: WBAT   Pain: Pain Assessment Pain Assessment: 0-10 Pain Score: 7  Pain Type: Surgical pain Pain Location: Knee Pain  Orientation: Right;Left Pain Descriptors / Indicators: Sore;Spasm Pain Frequency: Constant Pain Onset: Progressive Patients Stated Pain Goal: 3 Pain Intervention(s): Medication (See eMAR) Multiple Pain Sites: No   Locomotion : Ambulation Ambulation/Gait Assistance: 5: Supervision   See FIM for current functional status  Therapy/Group: Individual Therapy  Lurena Joiner B. Velecia Ovitt, PT, DPT [phone]   03/30/2013, 3:57 PM

## 2013-03-30 NOTE — Progress Notes (Signed)
Orthopedic Tech Progress Note Patient Details:  Jennifer Sanford 1950-12-31 [id number]  CPM Left Knee CPM Left Knee: On Left Knee Flexion (Degrees): 60 Left Knee Extension (Degrees): 0 Additional Comments:  (on for 2 hours ) CPM Right Knee CPM Right Knee: Off Right Knee Flexion (Degrees):  (70) Right Knee Extension (Degrees): 0   Cammer, Mickie Bail 03/30/2013, 3:31 PM

## 2013-03-31 ENCOUNTER — Inpatient Hospital Stay (HOSPITAL_COMMUNITY): Payer: 59 | Admitting: Physical Therapy

## 2013-03-31 ENCOUNTER — Inpatient Hospital Stay (HOSPITAL_COMMUNITY): Payer: 59

## 2013-03-31 ENCOUNTER — Inpatient Hospital Stay (HOSPITAL_COMMUNITY): Payer: 59 | Admitting: Occupational Therapy

## 2013-03-31 LAB — PROTIME-INR
INR: 2.18 — ABNORMAL HIGH (ref 0.00–1.49)
Prothrombin Time: 23.6 seconds — ABNORMAL HIGH (ref 11.6–15.2)

## 2013-03-31 MED ORDER — WARFARIN SODIUM 7.5 MG PO TABS
7.5000 mg | ORAL_TABLET | Freq: Once | ORAL | Status: AC
  Administered 2013-03-31: 7.5 mg via ORAL
  Filled 2013-03-31: qty 1

## 2013-03-31 NOTE — Progress Notes (Signed)
Occupational Therapy Session Note  Patient Details  Name: Jennifer Sanford MRN: [id number] Date of Birth: 08/18/1951  Today's Date: 03/31/2013 Time: 1400-1500 Time Calculation (min): 60 min  Short Term Goals: STGs=LTGs  Skilled Therapeutic Interventions/Progress Updates:    Therapy session focused on functional transfers, IADL tasks, activity tolerance, and strengthening. Pt ambulated from room to therapy gym then propelled self in w/c to ADL apartment. Completed tub transfer with TTB at supervision level with daughter [name]) and friend present. Pt required rest break after transfer d/t pain. Engaged in kitchen meal prep activity of retrieving items from overhead cabinet and bringing them to microwave and refrigerator. Pt reported she will not be carrying dinner trays as family/friends will be there for this. Completed activity at supervision level. Engaged in towel glide exercises for BLE while on mat table. Pt then propelled self back to room in w/c.   Therapy Documentation Precautions:  Precautions Precautions: Knee;Fall Required Braces or Orthoses: Knee Immobilizer - Left Knee Immobilizer - Right: Other (comment) (Discontinued) Knee Immobilizer - Left: Discontinue once straight leg raise with < 10 degree lag Restrictions Weight Bearing Restrictions: Yes RLE Weight Bearing: Weight bearing as tolerated LLE Weight Bearing: Weight bearing as tolerated Other Position/Activity Restrictions: WBAT General:   Vital Signs:   Pain: Pt reported 4/10 pain in both knees.    Other Treatments:    See FIM for current functional status  Therapy/Group: Individual Therapy  Daneil Dan 03/31/2013, 3:28 PM

## 2013-03-31 NOTE — Progress Notes (Signed)
Occupational Therapy Weekly Progress Note  Patient Details  Name: Jennifer Sanford MRN: [id number] Date of Birth: 1950/12/18  Today's Date: 03/31/2013 Time: 0800-0900 Time Calculation (min): 60 min  Patient has met 3 of 3 short term goals.  Pt has been progressing extremely well. She is now supervision with ADL transfers to toilet, min to tub bench, min to don shoe, mod I with toileting and bathing.  Patient continues to demonstrate the following deficits: decreased knee flexion which requires her to move more slowly and decreases her balance and therefore will continue to benefit from skilled OT intervention to enhance overall performance with BADL and iADL.  Patient progressing toward long term goals..  Continue plan of care.  OT Short Term Goals Week 1:  OT Short Term Goal 1 (Week 1): Pt will complete sit to stand from toilet with min assist to increase independence with toileting. OT Short Term Goal 1 - Progress (Week 1): Met OT Short Term Goal 2 (Week 1): Pt will don pants over feet with min assist. OT Short Term Goal 2 - Progress (Week 1): Met OT Short Term Goal 3 (Week 1): Pt will be able to transfer to a shower bench with min assist. OT Short Term Goal 3 - Progress (Week 1): Met Week 2:  OT Short Term Goal 1 (Week 2): STGs = LTGs  Skilled Therapeutic Interventions/Progress Updates:    Pt seen for BADL retraining of toileting, bathing, and dressing with a focus on functional mobility with RW. Pt only needs the KI on LLE.  Pt ambulated with RW to bathroom to complete toileting and bathing.  She ambulated back to bedroom to complete dressing. She donned KI on LLE each time she had to ambulate. She continues to need assist to don right shoe due to edema.  Overall, pt is progressing well.  Continue OT 5-7 days a week 1-2x a day for 60-90 minutes with Balance/vestibular training;Discharge planning;DME/adaptive equipment instruction;Functional mobility training;Pain management;Psychosocial  support;Patient/family education;Self Care/advanced ADL retraining;Therapeutic Activities;Therapeutic Exercise;UE/LE Strength taining/ROM to maximize her independence with ADLs.  Therapy Documentation Precautions:  Precautions Precautions: Knee;Fall Required Braces or Orthoses: Knee Immobilizer - Right;Knee Immobilizer - Left Knee Immobilizer - Right: Other (comment) (Discontinued) Knee Immobilizer - Left: Discontinue once straight leg raise with < 10 degree lag Restrictions Weight Bearing Restrictions: Yes RLE Weight Bearing: Weight bearing as tolerated LLE Weight Bearing: Weight bearing as tolerated Other Position/Activity Restrictions: WBAT  Pain: Pain Assessment Pain Assessment: 0-10 Pain Score: 7  Pain Type: Surgical pain Pain Location: Knee Pain Orientation: Right;Left Pain Descriptors / Indicators: Sore;Spasm Pain Frequency: Constant Pain Onset: Progressive Patients Stated Pain Goal: 3 Pain Intervention(s): Medication (See eMAR) Multiple Pain Sites: No ADL:  See FIM for current functional status  Therapy/Group: Individual Therapy  SAGUIER,JULIA 03/31/2013, 9:44 AM

## 2013-03-31 NOTE — Progress Notes (Signed)
ANTICOAGULATION CONSULT NOTE - Follow Up Consult  Pharmacy Consult for coumadin Indication: VTE prophylaxis  No Known Allergies  Patient Measurements: Weight: 238 lb 9.6 oz (108.228 kg) Heparin Dosing Weight:   Vital Signs: Temp: 98.1 F (36.7 C) (07/09 0551) Temp src: Oral (07/09 0551) BP: 113/72 mmHg (07/09 0551) Pulse Rate: 77 (07/09 0551)  Labs:  Recent Labs  03/29/13 0520 03/30/13 0520 03/31/13 0800  LABPROT 25.7* 27.3* 23.6*  INR 2.44* 2.64* 2.18*    The CrCl is unknown because both a height and weight (above a minimum accepted value) are required for this calculation.   Medications:  Scheduled:  . citalopram  10 mg Oral q morning - 10a  . iron polysaccharides  150 mg Oral Daily  . pantoprazole  40 mg Oral BID AC  . polyethylene glycol  17 g Oral Daily  . senna-docusate  2 tablet Oral QHS  . triamterene-hydrochlorothiazide  0.5 tablet Oral q morning - 10a  . Warfarin - Pharmacist Dosing Inpatient   Does not apply q1800   Infusions:    Assessment: 62 yo female s/p TKA is currently on therapeutic coumadin, but INR down to 2.18 from 2.64 after 5mg  dose last night. Goal of Therapy:  INR 2-3 Monitor platelets by anticoagulation protocol: Yes   Plan:  1) Coumadin 7.5mg  po x1 2) INR in am  Cylas Falzone, Tsz-Yin 03/31/2013,8:44 AM

## 2013-03-31 NOTE — Progress Notes (Signed)
Physical Therapy Session Note  Patient Details  Name: Jennifer Sanford MRN: [id number] Date of Birth: 08-30-1951  Today's Date: 03/31/2013 Time: 0930-1030 Time Calculation (min): 60 min  Short Term Goals: Week 2:  PT Short Term Goal 1 (Week 2): STGs=LTGs  Skilled Therapeutic Interventions/Progress Updates:  Treatment focused on gait, bil knee flexibility and strength, bed mobility.  Gait with RW x 186' x 2 with supervision.  Gait up/down 1 (4") step, backwards with RW, leading with L foot up, R foot down.  Sit>< stand from various height surfaces, wearing L KI.  Bil knee flexibility in sitting, with overpressure from therapist, hold x 5 seconds on end flexion range, x 10 reps R and L.  NuStep at level 1 working on knee flexion, x 7 minutes.  Pt very painful; RN called for additional pain meds.  Sit> supine in bed with min assist for LLE.    Therapy Documentation Precautions:  Precautions Precautions: Knee;Fall Required Braces or Orthoses: Knee Immobilizer - Left Knee Immobilizer - Right: Other (comment) (Discontinued) Knee Immobilizer - Left: Discontinue once straight leg raise with < 10 degree lag Restrictions Weight Bearing Restrictions: Yes RLE Weight Bearing: Weight bearing as tolerated LLE Weight Bearing: Weight bearing as tolerated Other Position/Activity Restrictions: WBAT   Pain: Pain Assessment Pain Assessment: 0-10 Pain Score: 8  Pain Type: Surgical pain Pain Location: Knee Pain Orientation: Right;Left Pain Descriptors / Indicators: Sore Pain Frequency: Constant Pain Onset: Progressive Patients Stated Pain Goal: 3 Pain Intervention(s): Medication (See eMAR) Multiple Pain Sites: No    See FIM for current functional status  Therapy/Group: Individual Therapy  Estelle Greenleaf 03/31/2013, 10:56 AM

## 2013-03-31 NOTE — Progress Notes (Signed)
Patient ID: Jennifer Sanford, female   DOB: October 21, 1950, 62 y.o.   MRN: [id number]  62 y.o. right-handed female admitted 03/16/2013 with end stage changes bilateral knees and no relief with conservative care. 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 Bilateral total knee arthroplasty 03/17/2013 per Dr.Alusio. Placed on Coumadin for DVT prophylaxis with subcutaneous Lovenox for INR greater than 2.00. Advised weightbearing as tolerated. Acute blood loss anemia latest hemoglobin 7.2 and transfused with latest hemoglobin 9.3and monitored.   Subjective/Complaints:  amb in room with sup and RW, no severe pain No bowel issues    Objective: Vital Signs: Blood pressure 113/72, pulse 77, temperature 98.1 F (36.7 C), temperature source Oral, resp. rate 18, weight 108.228 kg (238 lb 9.6 oz), SpO2 98.00%. No results found. Results for orders placed during the hospital encounter of 03/22/13 (from the past 72 hour(s))  PROTIME-INR     Status: Abnormal   Collection Time    03/29/13  5:20 AM      Result Value Range   Prothrombin Time 25.7 (*) 11.6 - 15.2 seconds   INR 2.44 (*) 0.00 - 1.49  PROTIME-INR     Status: Abnormal   Collection Time    03/30/13  5:20 AM      Result Value Range   Prothrombin Time 27.3 (*) 11.6 - 15.2 seconds   INR 2.64 (*) 0.00 - 1.49  PROTIME-INR     Status: Abnormal   Collection Time    03/31/13  8:00 AM      Result Value Range   Prothrombin Time 23.6 (*) 11.6 - 15.2 seconds   INR 2.18 (*) 0.00 - 1.49     HEENT: normal Cardio: RRR Resp: CTA B/L GI: BS positive Extremity:  Edema pretibial 1+ Skin:   Intact and Wound C/D/I Neuro: Alert/Oriented Musc/Skel:  Swelling bilateral knees, no erythema Gen NAD   Assessment/Plan: 1. Functional deficits secondary to B TKR which require 3+ hours per day of interdisciplinary therapy in a comprehensive inpatient rehab setting. Physiatrist is providing close team supervision and 24 hour management of  active medical problems listed below. Physiatrist and rehab team continue to assess barriers to discharge/monitor patient progress toward functional and medical goals. FIM: FIM - Bathing Bathing Steps Patient Completed: Chest;Right Arm;Left Arm;Abdomen;Front perineal area;Buttocks;Right upper leg;Left upper leg;Right lower leg (including foot);Left lower leg (including foot) Bathing: 6: More than reasonable amount of time  FIM - Upper Body Dressing/Undressing Upper body dressing/undressing steps patient completed: Thread/unthread right bra strap;Thread/unthread left bra strap;Hook/unhook bra;Thread/unthread right sleeve of pullover shirt/dresss;Thread/unthread left sleeve of pullover shirt/dress;Put head through opening of pull over shirt/dress;Pull shirt over trunk Upper body dressing/undressing: 5: Set-up assist to: Obtain clothing/put away FIM - Lower Body Dressing/Undressing Lower body dressing/undressing steps patient completed: Thread/unthread right pants leg;Thread/unthread left pants leg;Pull pants up/down;Don/Doff left shoe Lower body dressing/undressing: 4: Min-Patient completed 75 plus % of tasks  FIM - Toileting Toileting steps completed by patient: Adjust clothing prior to toileting;Performs perineal hygiene;Adjust clothing after toileting Toileting Assistive Devices: Grab bar or rail for support Toileting: 6: More than reasonable amount of time  FIM - Diplomatic Services operational officer Devices: Walker;Elevated toilet seat Toilet Transfers: 4-To toilet/BSC: Min A (steadying Pt. > 75%);4-From toilet/BSC: Min A (steadying Pt. > 75%)  FIM - Bed/Chair Transfer Bed/Chair Transfer Assistive Devices: Therapist, occupational: 4: Sit > Supine: Min A (steadying pt. > 75%/lift 1 leg);5: Bed > Chair or W/C: Supervision (verbal cues/safety issues);5: Chair or W/C >  Bed: Supervision (verbal cues/safety issues)  FIM - Locomotion: Wheelchair Distance: 100 Locomotion: Wheelchair:  1: Total Assistance/staff pushes wheelchair (Pt<25%) FIM - Locomotion: Ambulation Locomotion: Ambulation Assistive Devices: Designer, industrial/product Ambulation/Gait Assistance: 5: Supervision Locomotion: Ambulation: 5: Travels 150 ft or more with supervision/safety issues  Comprehension Comprehension Mode: Auditory Comprehension: 7-Follows complex conversation/direction: With no assist  Expression Expression Mode: Verbal Expression: 7-Expresses complex ideas: With no assist  Social Interaction Social Interaction: 7-Interacts appropriately with others - No medications needed.  Problem Solving Problem Solving: 7-Solves complex problems: Recognizes & self-corrects  Memory Memory: 7-Complete Independence: No helper  Medical Problem List and Plan:  1. 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 DVT Prophylaxis/Anticoagulation: Coumadin for DVT prophylaxis. Continue Lovenox for INR greater than 2.00. Monitor for any bleeding episodes. Check vascular study  3. Pain Management: Hydrocodone/Robaxin as needed. Monitor with increased mobility  4. Mood/depression. Celexa 10 mg daily. Provide emotional support  5. Neuropsych: This patient is capable of making decisions on her own behalf.  6. Postoperative anemia. Patient has been transfused. Continue iron supplement. Followup CBC  7. Hypertension. Maxzide daily. Monitor the increased mobility  8. Hyperlipidemia. Lipitor  9. GERD. Protonix  10.  Leukocytosis no fever, wound looks good, monitor,  UA- 11.  Increased AST,ALT,increasing off acetominophen, will hold lipitor, was on Crestor at home recheck CMET prior to D/C  LOS (Days) 9 A FACE TO FACE EVALUATION WAS PERFORMED  KIRSTEINS,ANDREW E 03/31/2013, 1:19 PM

## 2013-03-31 NOTE — Progress Notes (Signed)
Orthopedic Tech Progress Note Patient Details:  CHARI PARMENTER 09-02-1951 [id number] Put on cpm at 10:15 (B) LE 0-85 RLE- 0-80 LLE. Patient ID: JALAYA SARVER, female   DOB: Feb 08, 1951, 62 y.o.   MRN: [id number]   Jennye Moccasin 03/31/2013, 11:03 PM

## 2013-03-31 NOTE — Progress Notes (Signed)
Physical Therapy Session Note  Patient Details  Name: Jennifer Sanford MRN: [id number] Date of Birth: [birth year]  Today's Date: 03/31/2013 Time: 1530-1605 Time Calculation (min): 35 min  Short Term Goals: Week 1:  PT Short Term Goal 1 (Week 1): Pt will perform bed mobility with Min A  PT Short Term Goal 1 - Progress (Week 1): Met PT Short Term Goal 2 (Week 1): Pt will perform bed<>chair transfers with Min A PT Short Term Goal 2 - Progress (Week 1): Met PT Short Term Goal 3 (Week 1): Pt will ambulate 30' with RW and S PT Short Term Goal 3 - Progress (Week 1): Met PT Short Term Goal 4 (Week 1): Pt will obtain 50 degrees knee ROM bilaterally from supine position PT Short Term Goal 4 - Progress (Week 1): Met PT Short Term Goal 5 (Week 1): Pt will perform 6 stairs with bil rails and min A  PT Short Term Goal 5 - Progress (Week 1): Discontinued (comment) (she only has one STE (4"), d/c'd goal due to unrealistic.  ) Week 2:  PT Short Term Goal 1 (Week 2): STGs=LTGs  Skilled Therapeutic Interventions/Progress Updates:   Pt up in w/c; Pt performed gait x 150' x 2 reps with RW with first repetition with L KI still donned and return trip to room with neither KI donned with focus on increasing active hip and knee flexion during swing phase with supervision-min A.  Pt requesting to practice one step up with RW for home entry/exit; practiced both negotiating one step with RW forwards and backwards with L KI donned and supervision but verbal cues needed for safe sequence with KI (ascend with RLE and descend with LLE).  Pt transferred to long sitting on mat and performed 2 sets x 10 reps SLR bilaterally with min-mod A to initiate; performed bilat quad sets with 5-6 second isometric hold x 8 reps each LE.  Performed trial of patient ambulating without either KI; pt able to ambulate back to room with min  A and no evidence of knee instability.  Returned to w/c with supervision.     Therapy  Documentation Precautions:  Precautions Precautions: Knee Required Braces or Orthoses:  (Discontinued bilat KI) Knee Immobilizer - Right:  (Discontinued) Knee Immobilizer - Left:  (Discontinued) Restrictions Weight Bearing Restrictions: Yes RLE Weight Bearing: Weight bearing as tolerated LLE Weight Bearing: Weight bearing as tolerated Other Position/Activity Restrictions: WBAT Vital Signs: Therapy Vitals Temp: 97.9 F (36.6 C) Temp src: Oral Resp: 18 BP: 109/77 mmHg Patient Position, if appropriate: Sitting Oxygen Therapy SpO2: 100 % Pain: Pain Assessment Pain Assessment: No/denies pain Pain Score: 6  Pain Type: Surgical pain Pain Location: Knee Pain Orientation: Right;Left Pain Descriptors / Indicators: Sore;Spasm Pain Frequency: Constant Pain Onset: Progressive Patients Stated Pain Goal: 3 Pain Intervention(s): Medication (See eMAR) Multiple Pain Sites: No  See FIM for current functional status  Therapy/Group: Individual Therapy  Edman Circle [facility] 03/31/2013, 4:48 PM

## 2013-03-31 NOTE — Progress Notes (Signed)
Social Work Patient ID: Jennifer Sanford, female   DOB: March 04, 1951, 62 y.o.   MRN: [id number] Met with pt with daughter to inform team conference progression toward goals and discharge 7/12.  Pt is pleased with her progress and feels she will be ready On Sat for discharge.  She prefers to [place] [employer] for her DME and [facility] for her follow up therapies..  Have faxed DME order to [facility] [employer] [facility] and will have equipment delivered to  Pt's room prior to discharge.  Pt to also have home CPM[facility] to arrange.  Family education on-going at this time.

## 2013-04-01 ENCOUNTER — Inpatient Hospital Stay (HOSPITAL_COMMUNITY): Payer: 59

## 2013-04-01 ENCOUNTER — Inpatient Hospital Stay (HOSPITAL_COMMUNITY): Payer: 59 | Admitting: Occupational Therapy

## 2013-04-01 LAB — PROTIME-INR
INR: 2.34 — ABNORMAL HIGH (ref 0.00–1.49)
Prothrombin Time: 24.9 seconds — ABNORMAL HIGH (ref 11.6–15.2)

## 2013-04-01 MED ORDER — WARFARIN SODIUM 5 MG PO TABS
5.0000 mg | ORAL_TABLET | Freq: Once | ORAL | Status: AC
  Administered 2013-04-01: 5 mg via ORAL
  Filled 2013-04-01: qty 1

## 2013-04-01 NOTE — Patient Care Conference (Signed)
Inpatient RehabilitationTeam Conference and Plan of Care Update Date: 03/31/2013   Time: 11:15 Am    Patient Name: Jennifer Sanford      Medical Record Number: [id number]  Date of Birth: 1951/08/15 Sex: Female         Room/Bed: 4W06C/4W06C-01 Payor Info: Payor: [employer] / Plan:  [facility] / Product Type: *No Product type* /    Admitting Diagnosis: B TKR  Admit Date/Time:  03/22/2013  1:12 PM Admission Comments: No comment available   Primary Diagnosis:  S/P bilateral unicompartmental knee replacement Principal Problem: S/P bilateral unicompartmental knee replacement  Patient Active Problem List   Diagnosis Date Noted  . S/P bilateral unicompartmental knee replacement 03/22/2013  . Postop Transfusion 03/21/2013  . Postoperative anemia due to acute blood loss 03/18/2013  . Hyponatremia 03/18/2013  . OA (osteoarthritis) of knee 03/17/2013  . TENOSYNOVITIS OF FOOT AND ANKLE 08/28/2009  . FOOT PAIN, RIGHT 08/28/2009  . PES PLANUS 08/28/2009  . ABNORMALITY OF GAIT 08/28/2009    Expected Discharge Date: Expected Discharge Date: 04/03/13  Team Members Present: Physician leading conference: Dr. Claudette Laws Social Worker Present: Dossie Der, LCSW Nurse Present: Laural Roes, RN PT Present: Edman Circle, PT;Becky Joycelyn Das, PT OT Present: Other (comment);Leonette Monarch, OT;Kris Jacklynn Lewis, OT (Kayla Perkinson-OT) SLP Present: Fae Pippin, SLP     Current Status/Progress Goal Weekly Team Focus  Medical   pain and poor ROM  Improve strength and ROM  D/C planning   Bowel/Bladder   continent of bowel/bladder, lbm 7/8, scheduled miralax  remain continent of bowel/bladder, scheduled miralax to maintain bm q2days while taking frequent pain medications  monitor pt for bm q2days and potentional adjustment of scheduled bowel medications   Swallow/Nutrition/ Hydration     na        ADL's   supervision with transfers to toilet and out of bed, min to mod  assist into bed and to tub bench, set up assist for TED hose, min assist with right shoe, mod I with toileting and bathing  mod I bathing, toileting, dressing (assist with TED hose), toilet transfers; supervision shower stall transfer and simple meal prep  ADL retraining, DME and AE training, functional mobility training, pt/ family education, strengthening   Mobility   Supervision transfers from elevated surfaces with upper extremity assist, supervision gait with RW >150', One step with RW min guard asssit backwards.  Bed mobility min assist.  Pt needs to work on ROM every session.  HHPT for home (per sticky note she has all equipment needed)  Supervision overall except for car transfers min assist.    FLEXION of bil knees, edema management, pain management, quad strength, gait, stairs, WC mobility, WC parts, bed mobility and transfers.     Communication     na        Safety/Cognition/ Behavioral Observations    na        Pain   bilateral surgical knee pain, takes 100 mg ultram q6hours and robaxin 500 mg q6hours, trying not to take oxycodone but occasionally needs it after therapy or during/after cpm usage  pain 3 or less on 1-10 scale  monitor for new/greater onset of pain and management with current pain medications   Skin   bilateral knee incisions C.D.I. with steri strips as closure, no dressing, no drainage  no new breakdown   monitor skin qshift and prn, use protective skin care products as needed       *See Care Plan and  progress notes for long and short-term goals.  Barriers to Discharge: mobilizing slowly cont program    Possible Resolutions to Barriers:  see above, HH PT,OT RN post D/C    Discharge Planning/Teaching Needs:  Home with daughter's assisting for short time.  Will order DME and arrange follow up-d/c Sat      Team Discussion:  Knee range and flexion better need home cpm's.  Thigh high teds for edema control.  Family ed ongoing  Revisions to Treatment Plan:  NOne    Continued Need for Acute Rehabilitation Level of Care: The patient requires daily medical management by a physician with specialized training in physical medicine and rehabilitation for the following conditions: Daily direction of a multidisciplinary physical rehabilitation program to ensure safe treatment while eliciting the highest outcome that is of practical value to the patient.: Yes Daily medical management of patient stability for increased activity during participation in an intensive rehabilitation regime.: Yes Daily analysis of laboratory values and/or radiology reports with any subsequent need for medication adjustment of medical intervention for : Post surgical problems  [name], [name] 04/01/2013, 9:06 AM

## 2013-04-01 NOTE — Progress Notes (Signed)
ANTICOAGULATION CONSULT NOTE - Follow Up Consult  Pharmacy Consult for coumadin Indication: VTE prophylaxis  No Known Allergies  Patient Measurements: Weight: 224 lb 9.6 oz (101.878 kg) Heparin Dosing Weight:   Vital Signs: Temp: 98.2 F (36.8 C) (07/10 0533) Temp src: Oral (07/10 0533) BP: 114/76 mmHg (07/10 0533) Pulse Rate: 69 (07/10 0533)  Labs:  Recent Labs  03/30/13 0520 03/31/13 0800 04/01/13 0600  LABPROT 27.3* 23.6* 24.9*  INR 2.64* 2.18* 2.34*    The CrCl is unknown because both a height and weight (above a minimum accepted value) are required for this calculation.   Medications:  Scheduled:  . citalopram  10 mg Oral q morning - 10a  . iron polysaccharides  150 mg Oral Daily  . pantoprazole  40 mg Oral BID AC  . polyethylene glycol  17 g Oral Daily  . senna-docusate  2 tablet Oral QHS  . triamterene-hydrochlorothiazide  0.5 tablet Oral q morning - 10a  . Warfarin - Pharmacist Dosing Inpatient   Does not apply q1800   Infusions:    Assessment: 62 yo female s/p bilateral TKA is currently on therapeutic coumadin.  INR is 2.34. Goal of Therapy:  INR 2-3 Monitor platelets by anticoagulation protocol: Yes   Plan:  1) Coumadin 5mg  po x1. Patient will probably need 7.5mg  on MWFSu and 5mg  TThSa. 2) INR in am  Onelia Cadmus, Tsz-Yin 04/01/2013,8:30 AM

## 2013-04-01 NOTE — Progress Notes (Signed)
Occupational Therapy Session Note  Patient Details  Name: Jennifer Sanford MRN: [id number] Date of Birth: [birth year]  Today's Date: 04/01/2013 Time: 0800-0900 Time Calculation (min): 60 min  Short Term Goals: Week 1:  OT Short Term Goal 1 (Week 1): Pt will complete sit to stand from toilet with min assist to increase independence with toileting. OT Short Term Goal 1 - Progress (Week 1): Met OT Short Term Goal 2 (Week 1): Pt will don pants over feet with min assist. OT Short Term Goal 2 - Progress (Week 1): Met OT Short Term Goal 3 (Week 1): Pt will be able to transfer to a shower bench with min assist. OT Short Term Goal 3 - Progress (Week 1): Met Week 2:  OT Short Term Goal 1 (Week 2): STGs = LTGs  Skilled Therapeutic Interventions/Progress Updates:      Pt seen for BADL retraining of toileting, bathing, and dressing with a focus on functional mobility with RW from a mod I level to bathroom to prepare her for discharge in 2 days.  She ambulated to toilet with mod I after her socks were applied. She used tub bench with supervision.  Mod I with bathing.  She used sock aid to don socks ans was later able to don them without sock aid. Later TEDs applied.  She used shoe horn with shoes but continues to have difficulty due to small shoe size.  At end of session, pt resting in arm chair.  Pt is now permitted to ambulate to bathroom independently with her RW as long as she has on her shoes or non slip socks.   Therapy Documentation Precautions:  Precautions Precautions: Knee Required Braces or Orthoses:  (Discontinued bilat KI) Knee Immobilizer - Right:  (Discontinued) Knee Immobilizer - Left:  (Discontinued) Restrictions Weight Bearing Restrictions: Yes RLE Weight Bearing: Weight bearing as tolerated LLE Weight Bearing: Weight bearing as tolerated Other Position/Activity Restrictions: WBAT   Pain: Pain Assessment Pain Assessment: No/denies pain Pain Score: 8  Pain Type: Surgical  pain Pain Location: Knee Pain Orientation: Right;Left Pain Descriptors / Indicators: Aching Pain Frequency: Constant Pain Onset: On-going Patients Stated Pain Goal: 3 Pain Intervention(s): RN made aware ADL:  See FIM for current functional status  Therapy/Group: Individual Therapy  [name] 04/01/2013, 9:54 AM

## 2013-04-01 NOTE — Progress Notes (Signed)
Occupational Therapy Session Note  Patient Details  Name: Jennifer Sanford MRN: [id number] Date of Birth: Sep 05, 1951  Today's Date: 04/01/2013 Time: 1030-1115 Time Calculation (min): 45 min  Short Term Goals: Week 2:  OT Short Term Goal 1 (Week 2): STGs = LTGs  Skilled Therapeutic Interventions/Progress Updates:    Therapy session focused on activity tolerance, standing balance, strengthening, and community re-entry. Pt received supine in bed and ambulated from room to ADL apartment with supervision and min cues to focus on knee flexion when ambulating. Pt propelled self in w/c and navigated to different floor of this facility. Pt practiced short distance ambulation in area where she was working prior to admission. Pt propelled self back to elevator and to therapy gym. Engaged in BLE exercises to increase knee flexion and upright posture in standing.   Therapy Documentation Precautions:  Precautions Precautions: Knee Required Braces or Orthoses:  (Discontinued bilat KI) Knee Immobilizer - Right:  (Discontinued) Knee Immobilizer - Left:  (Discontinued) Restrictions Weight Bearing Restrictions: Yes RLE Weight Bearing: Weight bearing as tolerated LLE Weight Bearing: Weight bearing as tolerated Other Position/Activity Restrictions: WBAT General:   Vital Signs:   Pain: 4/10 pain in knees.   Other Treatments:    See FIM for current functional status  Therapy/Group: Individual Therapy  Daneil Dan 04/01/2013, 11:17 AM

## 2013-04-01 NOTE — Progress Notes (Signed)
Orthopedic Tech Progress Note Patient Details:  Jennifer Sanford [birth year] [id number] Put on cpm at 9:15 pm (B) LE LLE 0-90 - RLE 0-85 Patient ID: Jennifer Sanford, female   DOB: [birth year], 62 y.o.   MRN: [id number]   Jennifer Sanford 04/01/2013, 9:38 PM

## 2013-04-01 NOTE — Progress Notes (Signed)
Occupational Therapy Discharge Summary  Patient Details  Name: Jennifer Sanford MRN: [id number] Date of Birth: Feb 24, 1951  Today's Date: 04/02/13  Patient has met 7 of 7 long term goals due to improved activity tolerance, improved balance and ability to compensate for deficits.  Patient to discharge at overall Modified Independent level.  Patient's care partner is independent to provide the necessary physical assistance at discharge.    Reasons goals not met: n/a  Recommendation:  No further OT services required.  Equipment: tub bench and BSC  Reasons for discharge: treatment goals met  Patient/family agrees with progress made and goals achieved: Yes  OT Discharge ADL  Mod I except for supervision with tub transfer and simple meal prep and assist to don right shoe. Vision/Perception  Vision - History Patient Visual Report: No change from baseline Vision - Assessment Eye Alignment: Within Functional Limits Perception Perception: Within Functional Limits Praxis Praxis: Intact  Cognition Overall Cognitive Status: Within Functional Limits for tasks assessed Sensation Sensation Light Touch: Appears Intact Stereognosis: Appears Intact Hot/Cold: Appears Intact Proprioception: Appears Intact Coordination Fine Motor Movements are Fluid and Coordinated: Yes Motor  Motor Motor: Within Functional Limits Motor - Skilled Clinical Observations: Decreased muscle activation due to pain.  Mobility    Mod I for toilet transfers with RW Trunk/Postural Assessment  Cervical Assessment Cervical Assessment: Within Functional Limits Thoracic Assessment Thoracic Assessment: Within Functional Limits Lumbar Assessment Lumbar Assessment: Within Functional Limits Postural Control Postural Control: Within Functional Limits  Balance Static Sitting Balance Static Sitting - Level of Assistance: 7: Independent Dynamic Sitting Balance Dynamic Sitting - Level of Assistance: 7:  Independent Static Standing Balance Static Standing - Level of Assistance: 6: Modified independent (Device/Increase time) Dynamic Standing Balance Dynamic Standing - Level of Assistance: 6: Modified independent (Device/Increase time) Extremity/Trunk Assessment RUE Assessment RUE Assessment: Within Functional Limits LUE Assessment LUE Assessment: Within Functional Limits  See FIM for current functional status  SAGUIER,JULIA 04/01/2013, 12:14 PM  Bernardo Brayman 04/02/2013, 2:24 PM

## 2013-04-01 NOTE — Progress Notes (Addendum)
Physical Therapy Session Note  Patient Details  Name: Jennifer Sanford MRN: [id number] Date of Birth: [birth year]  Today's Date: 04/01/2013 Time: 0900-1000 and 1500-1530 Time Calculation (min): 60 min and 30 min  Short Term Goals: Week 2:  PT Short Term Goal 1 (Week 2): STGs=LTGs  Skilled Therapeutic Interventions/Progress Updates:   Treatment 1:  focused on gait training, bil LE strengthening and flexibility, stairs.  Advanced gait training with KG:MWNUUVO obstacle course of cones and canes; pt able to clear canes easily, bil; with HHA sidestepping L and R for hip abductors. Up/down 5 steps including 5" and 7" heights, bil rails, min guard assist, ascending with either LE, descending with RLE.  Therapeutic exercise performed with LE to increase strength and flexibility for functional mobility:  L knee flexion 65 degrees; R 70 degrees, in sitting on 24" high mat AROM with overpressure from therapist and isometric contraction at end range.  Pain management with meds better today than yesterday for working on knee flexion.  Treatment 2:  Simulated car transfer with supervision, scooting to far side of bench seat, seat at SUV height.  Discussed at length which car pt will use; planned actual car transfer for family ed tomorrow with pt's daughter.  Gait x 230' with supervision, RW, focusing on increased speed, wt shifting, passive hip and knee flexion as each LE is unweighted.    Therapy Documentation Precautions:  Precautions Precautions: Knee Required Braces or Orthoses:  (Discontinued bilat KI) Knee Immobilizer - Right:  (Discontinued) Knee Immobilizer - Left:  (Discontinued) Restrictions Weight Bearing Restrictions: Yes RLE Weight Bearing: Weight bearing as tolerated LLE Weight Bearing: Weight bearing as tolerated Other Position/Activity Restrictions: WBAT   Pain: Pain Assessment Pain Assessment: No/denies pain Pain Score:6 Pain Type: Surgical pain Pain Location:  Knee Pain Orientation: Right;Left Pain Descriptors / Indicators: Aching Pain Frequency: Constant Pain Onset: On-going Patients Stated Pain Goal: 3 Pain Intervention(s): RN made aware    See FIM for current functional status  Therapy/Group: Individual Therapy  [name] 04/01/2013, 10:09 AM

## 2013-04-01 NOTE — Progress Notes (Signed)
Social Work Patient ID: Jennifer Sanford xxxbrady, female   DOB: 1951/01/18, 62 y.o.   MRN: [id number] Met with pt to see if equipment company have contacted her regarding  Delivery of her equipment.  They have and arranged. Pt wants her prescriptions tomorrow due to OP pharmacy is not open on Sat.  Spoke with Dan-PA who will give tomorrow. Feels confident about discharge Sat.

## 2013-04-01 NOTE — Progress Notes (Signed)
Patient ID: MILIANNA ERICSSON, female   DOB: [birth year], 62 y.o.   MRN: [id number]  62 y.o. right-handed female admitted 03/16/2013 with end stage changes bilateral knees and no relief with conservative care. 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 Bilateral total knee arthroplasty 03/17/2013 per Dr.Alusio. Placed on Coumadin for DVT prophylaxis with subcutaneous Lovenox for INR greater than 2.00. Advised weightbearing as tolerated. Acute blood loss anemia latest hemoglobin 7.2 and transfused with latest hemoglobin 9.3and monitored.   Subjective/Complaints:  Knee feels pretty good. No new issues No bowel issues    Objective: Vital Signs: Blood pressure 114/76, pulse 69, temperature 98.2 F (36.8 C), temperature source Oral, resp. rate 19, weight 101.878 kg (224 lb 9.6 oz), SpO2 98.00%. No results found. Results for orders placed during the hospital encounter of 03/22/13 (from the past 72 hour(s))  PROTIME-INR     Status: Abnormal   Collection Time    03/30/13  5:20 AM      Result Value Range   Prothrombin Time 27.3 (*) 11.6 - 15.2 seconds   INR 2.64 (*) 0.00 - 1.49  PROTIME-INR     Status: Abnormal   Collection Time    03/31/13  8:00 AM      Result Value Range   Prothrombin Time 23.6 (*) 11.6 - 15.2 seconds   INR 2.18 (*) 0.00 - 1.49  PROTIME-INR     Status: Abnormal   Collection Time    04/01/13  6:00 AM      Result Value Range   Prothrombin Time 24.9 (*) 11.6 - 15.2 seconds   INR 2.34 (*) 0.00 - 1.49     HEENT: normal Cardio: RRR Resp: CTA B/L GI: BS positive Extremity:  Edema pretibial 1+ Skin:   Intact and Wound C/D/I Neuro: Alert/Oriented Musc/Skel:  Swelling bilateral knees, no erythema Gen NAD   Assessment/Plan: 1. Functional deficits secondary to B TKR which require 3+ hours per day of interdisciplinary therapy in a comprehensive inpatient rehab setting. Physiatrist is providing close team supervision and 24 hour management of active  medical problems listed below. Physiatrist and rehab team continue to assess barriers to discharge/monitor patient progress toward functional and medical goals. FIM: FIM - Bathing Bathing Steps Patient Completed: Chest;Right Arm;Left Arm;Abdomen;Front perineal area;Buttocks;Right upper leg;Left upper leg;Right lower leg (including foot);Left lower leg (including foot) Bathing: 6: More than reasonable amount of time  FIM - Upper Body Dressing/Undressing Upper body dressing/undressing steps patient completed: Thread/unthread right bra strap;Thread/unthread left bra strap;Hook/unhook bra;Thread/unthread right sleeve of pullover shirt/dresss;Thread/unthread left sleeve of pullover shirt/dress;Put head through opening of pull over shirt/dress;Pull shirt over trunk Upper body dressing/undressing: 7: Complete Independence: No helper FIM - Lower Body Dressing/Undressing Lower body dressing/undressing steps patient completed: Thread/unthread right underwear leg;Thread/unthread left underwear leg;Pull underwear up/down;Thread/unthread right pants leg;Thread/unthread left pants leg;Pull pants up/down;Don/Doff right sock;Don/Doff left sock;Don/Doff left shoe Lower body dressing/undressing: 4: Min-Patient completed 75 plus % of tasks  FIM - Toileting Toileting steps completed by patient: Adjust clothing prior to toileting;Performs perineal hygiene;Adjust clothing after toileting Toileting Assistive Devices: Grab bar or rail for support Toileting: 6: More than reasonable amount of time  FIM - Diplomatic Services operational officer Devices: Walker;Elevated toilet seat Toilet Transfers: 6-More than reasonable amt of time;6-To toilet/ BSC;6-From toilet/BSC  FIM - Banker Devices: Therapist, occupational: 6: Assistive device: no helper  FIM - Locomotion: Wheelchair Distance: 100 Locomotion: Wheelchair: 0: Activity did not occur FIM - Locomotion:  Ambulation Locomotion: Ambulation Assistive Devices: Designer, industrial/product Ambulation/Gait Assistance: 5: Supervision Locomotion: Ambulation: 5: Travels 150 ft or more with supervision/safety issues  Comprehension Comprehension Mode: Auditory Comprehension: 7-Follows complex conversation/direction: With no assist  Expression Expression Mode: Verbal Expression: 7-Expresses complex ideas: With no assist  Social Interaction Social Interaction: 7-Interacts appropriately with others - No medications needed.  Problem Solving Problem Solving: 7-Solves complex problems: Recognizes & self-corrects  Memory Memory: 7-Complete Independence: No helper  Medical Problem List and Plan:  1. 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 DVT Prophylaxis/Anticoagulation: Coumadin for DVT prophylaxis. Continue Lovenox for INR greater than 2.00. Monitor for any bleeding episodes. Check vascular study  3. Pain Management: Hydrocodone/Robaxin as needed. Monitor with increased mobility  4. Mood/depression. Celexa 10 mg daily. Provide emotional support  5. Neuropsych: This patient is capable of making decisions on her own behalf.  6. Postoperative anemia. Patient has been transfused. Continue iron supplement. Followup CBC  7. Hypertension. Maxzide daily. Monitor the increased mobility  8. Hyperlipidemia. Lipitor  9. GERD. Protonix  10.  Leukocytosis no fever, wound looks good, monitor,  UA- 11.  Increased AST,ALT,increasing off acetominophen, will hold lipitor, was on Crestor at home recheck CMET prior to D/C  LOS (Days) 10 A FACE TO FACE EVALUATION WAS PERFORMED  Claudette Laws E 04/01/2013, 6:31 PM

## 2013-04-02 ENCOUNTER — Inpatient Hospital Stay (HOSPITAL_COMMUNITY): Payer: 59 | Admitting: Physical Therapy

## 2013-04-02 ENCOUNTER — Inpatient Hospital Stay (HOSPITAL_COMMUNITY): Payer: 59 | Admitting: Occupational Therapy

## 2013-04-02 ENCOUNTER — Inpatient Hospital Stay (HOSPITAL_COMMUNITY): Payer: 59 | Admitting: Rehabilitation

## 2013-04-02 ENCOUNTER — Encounter (HOSPITAL_COMMUNITY): Payer: 59 | Admitting: Occupational Therapy

## 2013-04-02 DIAGNOSIS — Z96659 Presence of unspecified artificial knee joint: Secondary | ICD-10-CM

## 2013-04-02 DIAGNOSIS — M1710 Unilateral primary osteoarthritis, unspecified knee: Secondary | ICD-10-CM

## 2013-04-02 DIAGNOSIS — D62 Acute posthemorrhagic anemia: Secondary | ICD-10-CM

## 2013-04-02 DIAGNOSIS — IMO0002 Reserved for concepts with insufficient information to code with codable children: Secondary | ICD-10-CM

## 2013-04-02 LAB — CBC WITH DIFFERENTIAL/PLATELET
Basophils Absolute: 0 10*3/uL (ref 0.0–0.1)
Basophils Relative: 0 % (ref 0–1)
Eosinophils Absolute: 0.2 10*3/uL (ref 0.0–0.7)
Eosinophils Relative: 2 % (ref 0–5)
HCT: 29.9 % — ABNORMAL LOW (ref 36.0–46.0)
Hemoglobin: 9.9 g/dL — ABNORMAL LOW (ref 12.0–15.0)
Lymphocytes Relative: 27 % (ref 12–46)
Lymphs Abs: 2.8 10*3/uL (ref 0.7–4.0)
MCH: 29.9 pg (ref 26.0–34.0)
MCHC: 33.1 g/dL (ref 30.0–36.0)
MCV: 90.3 fL (ref 78.0–100.0)
Monocytes Absolute: 0.8 10*3/uL (ref 0.1–1.0)
Monocytes Relative: 8 % (ref 3–12)
Neutro Abs: 6.5 10*3/uL (ref 1.7–7.7)
Neutrophils Relative %: 63 % (ref 43–77)
Platelets: 765 10*3/uL — ABNORMAL HIGH (ref 150–400)
RBC: 3.31 MIL/uL — ABNORMAL LOW (ref 3.87–5.11)
RDW: 14.6 % (ref 11.5–15.5)
WBC: 10.3 10*3/uL (ref 4.0–10.5)

## 2013-04-02 LAB — COMPREHENSIVE METABOLIC PANEL
ALT: 43 U/L — ABNORMAL HIGH (ref 0–35)
AST: 27 U/L (ref 0–37)
Albumin: 2.4 g/dL — ABNORMAL LOW (ref 3.5–5.2)
Alkaline Phosphatase: 108 U/L (ref 39–117)
BUN: 15 mg/dL (ref 6–23)
CO2: 30 mEq/L (ref 19–32)
Calcium: 9.2 mg/dL (ref 8.4–10.5)
Chloride: 98 mEq/L (ref 96–112)
Creatinine, Ser: 0.79 mg/dL (ref 0.50–1.10)
GFR calc Af Amer: >90 mL/min (ref >90)
GFR calc non Af Amer: 89 mL/min — ABNORMAL LOW (ref >90)
Glucose, Bld: 103 mg/dL — ABNORMAL HIGH (ref 70–99)
Potassium: 4.4 mEq/L (ref 3.5–5.1)
Sodium: 135 mEq/L (ref 135–145)
Total Bilirubin: 0.5 mg/dL (ref 0.3–1.2)
Total Protein: 6.8 g/dL (ref 6.0–8.3)

## 2013-04-02 LAB — PROTIME-INR
INR: 2.44 — ABNORMAL HIGH (ref 0.00–1.49)
Prothrombin Time: 25.7 seconds — ABNORMAL HIGH (ref 11.6–15.2)

## 2013-04-02 MED ORDER — WARFARIN SODIUM 5 MG PO TABS
5.0000 mg | ORAL_TABLET | Freq: Every day | ORAL | Status: DC
  Filled 2013-04-02: qty 1

## 2013-04-02 MED ORDER — TRAMADOL HCL 50 MG PO TABS: 50.0000 mg | ORAL_TABLET | Freq: Four times a day (QID) | ORAL | Status: DC | PRN

## 2013-04-02 MED ORDER — TRIAMTERENE-HCTZ 37.5-25 MG PO TABS: 0.5000 | ORAL_TABLET | Freq: Every morning | ORAL | Status: DC

## 2013-04-02 MED ORDER — ROSUVASTATIN CALCIUM 20 MG PO TABS: 20.0000 mg | ORAL_TABLET | Freq: Every morning | ORAL | Status: DC

## 2013-04-02 MED ORDER — METHOCARBAMOL 500 MG PO TABS: 500.0000 mg | ORAL_TABLET | Freq: Four times a day (QID) | ORAL | Status: DC | PRN

## 2013-04-02 MED ORDER — WARFARIN SODIUM 5 MG PO TABS: ORAL_TABLET | ORAL | Status: DC

## 2013-04-02 MED ORDER — CITALOPRAM HYDROBROMIDE 10 MG PO TABS: 10.0000 mg | ORAL_TABLET | Freq: Every morning | ORAL | Status: DC

## 2013-04-02 MED ORDER — OXYCODONE HCL 5 MG PO TABS: 5.0000 mg | ORAL_TABLET | ORAL | Status: DC | PRN

## 2013-04-02 MED ORDER — POLYSACCHARIDE IRON COMPLEX 150 MG PO CAPS: 150.0000 mg | ORAL_CAPSULE | Freq: Every day | ORAL | Status: DC

## 2013-04-02 MED ORDER — PANTOPRAZOLE SODIUM 40 MG PO TBEC: 40.0000 mg | DELAYED_RELEASE_TABLET | Freq: Two times a day (BID) | ORAL | Status: DC

## 2013-04-02 NOTE — Progress Notes (Signed)
Physical Therapy Note  Patient Details  Name: ROGINA SCHIANO MRN: [id number] Date of Birth: 01-Apr-1951 Today's Date: 04/02/2013  1100-1155 (55 minutes) individual Pain: 8/10 bilateral knees/ nurse notified/meds given Focus of treatment: Pt now modified independent in room with RW; gait on unit 150+ feet RW SBA x 2; seated bilateral knee flexion using orange theraband as resistance with 5 second holds at end of active range to improve AROM ( right knee -12 to 70 degrees, left knee -5 to 65 degrees; up/down 4 inch step with RW backwards SBA (to enter home); pt shown how to use a step to passively improve bilateral knee flexion ; sit >< supine (mat) SBA with pt self assisting LEs on/off mat. . Pt to be DC'ed home today.   Roylene Heaton,JIM 04/02/2013, 11:29 AM

## 2013-04-02 NOTE — Discharge Summary (Signed)
NAMEMERISA, Jennifer Sanford NO.:  [id number]  MEDICAL RECORD NO.:  [id number]  LOCATION:  4W06C                        FACILITY:  [facility]  PHYSICIAN:  Erick Colace, M.D.DATE OF BIRTH:  1951-01-14  DATE OF ADMISSION:  03/22/2013 DATE OF DISCHARGE: 04/02/2013                              DISCHARGE SUMMARY   DISCHARGE DIAGNOSES: 1. 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 2. Coumadin for deep vein thrombosis prophylaxis. 3. Pain management. 4. Depression. 5. Postoperative anemia. 6. Hypertension. 7. Hyperlipidemia. 8. Gastroesophageal reflux disease.  HISTORY OF PRESENT ILLNESS:  This is a 62 year old right-handed female admitted March 16, 2013, with end-stage changes bilateral knees and no relief with conservative care.  The patient underwent bilateral total knee arthroplasty March 17, 2013, per Dr. Lequita Halt.  Placed on Coumadin for DVT prophylaxis with subcutaneous Lovenox to INR greater than 2.00. Advised weightbearing as tolerated.  Acute blood loss anemia 7.2.  The patient was transfused.  Postoperative pain control with epidural removed March 19, 2013.  Physical and occupational therapy ongoing.  The patient was admitted for a comprehensive rehab program.  PAST MEDICAL HISTORY:  See discharge diagnoses.  SOCIAL HISTORY:  Lives alone with supportive family.  FUNCTIONAL HISTORY:  Prior to admission independent.  FUNCTIONAL STATUS:  Upon admission to rehab services with +2 total assist to ambulate 24 feet with the rolling walker.  PHYSICAL EXAMINATION:  VITAL SIGNS:  Blood pressure 116/72, pulse 98, temperature 98, respirations 16.  This was an alert female, oriented x3. Pupils round and reactive to light. LUNGS:  Clear to auscultation. CARDIAC:  Regular rate and rhythm. ABDOMEN:  Soft, nontender.  Good bowel sounds. EXTREMITIES:  Bilateral knee incisions were dressed  appropriately tender.  REHABILITATION HOSPITAL COURSE:  The patient was admitted to inpatient rehab services with therapies initiated on a 3-hour daily basis consisting of physical therapy, occupational therapy, and 24-hour rehabilitation nursing.  The following issues were addressed during the patient's rehabilitation stay.  Pertaining to Jennifer Sanford's bilateral total knee arthroplasty March 17, 2013, remained stable.  Surgical site is healing nicely.  The patient weightbearing as tolerated.  Would follow up with Orthopedic Services.  The patient remained on Coumadin for DVT prophylaxis.  Venous Doppler studies negative.  Latest INR of 2.34.  A Home Health nurse had been arranged to check INR on April 06, 2013, with Coumadin to be completed April 16, 2013.  Pain management ongoing with the use of Robaxin as well as oxycodone with good results. The patient did have a documented history of depression.  She remained on Celexa with emotional support provided and the patient participating nicely with therapies.  Postoperative anemia, the patient had been transfused during her hospital course.  She remained on iron supplement asymptomatic.  Blood pressure is controlled on Maxzide.  She remained on Lipitor for a history of hyperlipidemia.  The patient received weekly collaborative interdisciplinary team conferences to discuss estimated length of stay, family teaching, and any barriers to her discharge.  She was continent of bowel and bladder, ambulating minimal assist with a rolling walker needing assistance for lower body activities of daily living, as well as donning  and doffing her socks.  Strength and endurance continued to improve.  Full family teaching was completed and plan will be discharged to home with family assistance and ongoing therapies.  DISCHARGE MEDICATIONS: 1. Celexa 10 mg p.o. daily. 2. Niferex 150 mg p.o. daily. 3. Robaxin 500 mg every 6 hours as needed for muscle  spasms. 4. Oxycodone immediate release 5 mg every 4 hours as needed for pain. 5. Protonix 40 mg daily. 6. MiraLAX daily, hold for loose stools. 7. Maxzide 37.5-25 mg one half tablet every morning. 8. Ultram 50-100 mg every 6 as needed for moderate pain. 9. Coumadin 5 mg daily adjusted accordingly for an INR of 2.0 to 3.0     to be completed April 16, 2013.  DIET:  Regular.  SPECIAL INSTRUCTIONS:  The patient should follow up Dr. Claudette Laws at the outpatient rehab center as needed.  Dr. Homero Fellers Aluisio in 2 weeks to call for appointment.  Dr. Andi Devon medical management.  A Home Health nurse had been arranged to check INR on Tuesday 07/15, as the patient would remain on Coumadin until April 16, 2013, at that time begin aspirin 81 mg daily x4 weeks after Coumadin completed.  Home health therapies have been arranged.     Jennifer Sanford, P.A.   ______________________________ Erick Colace, M.D.    DA/MEDQ  D:  04/02/2013  T:  04/02/2013  Job:  [id number]  cc:   Erick Colace, M.D. Ollen Gross, M.D. Merlene Laughter. Renae Gloss, M.D.

## 2013-04-02 NOTE — Progress Notes (Signed)
ANTICOAGULATION CONSULT NOTE - Follow Up Consult  Pharmacy Consult for coumadin Indication: VTE prophylaxis  No Known Allergies  Patient Measurements: Weight: 224 lb 9.6 oz (101.878 kg) Heparin Dosing Weight:   Vital Signs: Temp: 98.2 F (36.8 C) (07/11 0500) Temp src: Oral (07/11 0500) BP: 97/60 mmHg (07/11 0500) Pulse Rate: 80 (07/11 0500)  Labs:  Recent Labs  03/31/13 0800 04/01/13 0600 04/02/13 0700  LABPROT 23.6* 24.9* 25.7*  INR 2.18* 2.34* 2.44*  CREATININE  --   --  0.79    The CrCl is unknown because both a height and weight (above a minimum accepted value) are required for this calculation.   Medications:  Scheduled:  . citalopram  10 mg Oral q morning - 10a  . iron polysaccharides  150 mg Oral Daily  . pantoprazole  40 mg Oral BID AC  . polyethylene glycol  17 g Oral Daily  . senna-docusate  2 tablet Oral QHS  . triamterene-hydrochlorothiazide  0.5 tablet Oral q morning - 10a  . warfarin  5 mg Oral q1800  . Warfarin - Pharmacist Dosing Inpatient   Does not apply q1800   Infusions:    Assessment: 62 yo female s/p bilateral TKA is currently on therapeutic coumadin.  INR is 2.44.   Goal of Therapy:  INR 2-3 Monitor platelets by anticoagulation protocol: Yes   Plan:  1) Team wrote the patient to be discharged on coumadin 5mg  po qday.  Rec to recheck INR on Monday to reassess dosing.  Patient may require coumadin 7.5mg  three times a week and 5mg  the rest of the week.  Nehemiah Montee, Tsz-Yin 04/02/2013,8:14 AM

## 2013-04-02 NOTE — Progress Notes (Addendum)
Physical Therapy Discharge Summary  Patient Details  Name: Jennifer Sanford MRN: [id number] Date of Birth: [birth year]  Today's Date: 04/02/2013 Time: 1000-1057 Time Calculation (min): 57 min  Patient has met 9 of 9 long term goals due to improved activity tolerance, improved balance, improved postural control, increased strength, increased range of motion, decreased pain, ability to compensate for deficits and improved coordination.  Patient to discharge at an ambulatory level Supervision.   Pts daughter not present during any car transfer or stair training, however pt able to properly verbalize safe sequencing/technique of all mobility/transfers.     Reasons goals not met: n/a  Recommendation:  Patient will benefit from ongoing skilled PT services in home health setting to continue to advance safe functional mobility, address ongoing impairments in strength, ROM, balance, endurance, and minimize fall risk.  Equipment: w/c with elevating leg rests  Reasons for discharge: treatment goals met and discharge from hospital  Patient/family agrees with progress made and goals achieved: Yes  PT Discharge Precautions/Restrictions Precautions Precautions: Knee Required Braces or Orthoses:  (Discontinued bilat KI) Knee Immobilizer - Right:  (Discontinued) Knee Immobilizer - Left:  (Discontinued) Restrictions Weight Bearing Restrictions: Yes RLE Weight Bearing: Weight bearing as tolerated LLE Weight Bearing: Weight bearing as tolerated Vital Signs Therapy Vitals Pulse Rate: 86 BP: 108/61 mmHg Patient Position, if appropriate: Sitting Pain Pain Assessment Pain Assessment: 0-10 Pain Score: 3  Pain Type: Surgical pain Pain Location: Knee Pain Orientation: Right;Left Pain Descriptors / Indicators: Sore Pain Frequency: Constant Pain Onset: Progressive Patients Stated Pain Goal: 3 Pain Intervention(s): Medication (See eMAR) Multiple Pain Sites: No    Cognition Overall Cognitive  Status: Within Functional Limits for tasks assessed Orientation Level: Oriented X4 Sensation Sensation Light Touch: Appears Intact (bilateral LES) Motor  Motor Motor: Within Functional Limits Motor - Skilled Clinical Observations: Decreased muscle activation due to pain.     Locomotion  Ambulation Ambulation/Gait Assistance: 5: Supervision  Trunk/Postural Assessment  Cervical Assessment Cervical Assessment: Within Functional Limits Thoracic Assessment Thoracic Assessment: Within Functional Limits Lumbar Assessment Lumbar Assessment: Within Functional Limits Postural Control Postural Control: Within Functional Limits    Extremity Assessment    RLE PROM (degrees) Right Knee Extension: -12 Right Knee Flexion: 70 RLE Strength RLE Overall Strength Comments: in sitting, grossly hip flex 3/5; knee ext 3-/5; ankle DF 5/5; able to perform SLR  with < 10 degrees lag LLE PROM (degrees) Left Knee Extension: -5 Left Knee Flexion: 65 LLE Strength LLE Overall Strength Comments: in sitting, grossly, hip flexion 3-/5; knee ext 3/5; ankle DF 4+/5; able to perform SLR with less than degrees lag  See FIM for current functional status  Vista Deck 04/02/2013, 12:48 PM

## 2013-04-02 NOTE — Progress Notes (Signed)
Occupational Therapy Session Notes  Patient Details  Name: Jennifer Sanford MRN: [id number] Date of Birth: 1951-02-08  Today's Date: 04/02/2013 Time: 1000-1057 Time Calculation (min): 57 min  Short Term Goals: Week 2:  OT Short Term Goal 1 (Week 2): STGs = LTGs  Skilled Therapeutic Interventions/Progress Updates:  Self care retraining to include shower, dress and groom tasks.  Focus session on increased independence to prepare for discharge TODAY instead of tomorrow!  Patient able to don right shoe 2 Xs this am using LH shoe horn and only required assist to don TED hose which her daughter placed on her.  Patient stood at sink to groom and practiced knee bends while standing at the sink then again while seated EOB. Patient passed off to PT.  Therapy Documentation Precautions:  Precautions Precautions: Knee Required Braces or Orthoses:  (Discontinued bilat KI) Knee Immobilizer - Right:  (Discontinued) Knee Immobilizer - Left:  (Discontinued) Restrictions Weight Bearing Restrictions: Yes RLE Weight Bearing: Weight bearing as tolerated LLE Weight Bearing: Weight bearing as tolerated Other Position/Activity Restrictions: WBAT Pain: 2/10 before activity and 8/10 after BADL& IADL, rest repositioned and medication requested ADL: See FIM for current functional status  Therapy/Group: Individual Therapy   Teryn Gust 04/02/2013, 12:39 PM

## 2013-04-02 NOTE — Progress Notes (Signed)
Social Work Patient ID: Jennifer Sanford, female   DOB: [birth year], 62 y.o.   MRN: [id number] Met with pt who reports she is being discharged today instead of tomorrow, due to she feels ready to leave today. Have contacted [place] and [employer] to inform of earlier discharge.  [employer] to contact pt to arrange delivery of DME. Pt plans to stay through therapies today.

## 2013-04-02 NOTE — Progress Notes (Signed)
Physical Therapy Note  Patient Details  Name: Jennifer Sanford MRN: [id number] Date of Birth: Jan 11, 1951 Today's Date: 04/02/2013  Note that OT discussed with PT that pt wanting to D/C following am PT session.  PT went to pts room to discuss pts comfort with car transfer and how she/daughter planned on doing it.  Pt able to verbalize that she was going to back up to back car seat and scoot all the way in for transport home.  Pt states she feels comfortable with it and does not feel the need to practice this afternoon.     Vista Deck 04/02/2013, 11:03 AM

## 2013-04-02 NOTE — Progress Notes (Signed)
Patient ID: Jennifer Sanford, female   DOB: 03-19-1951, 62 y.o.   MRN: [id number]  62 y.o. right-handed female admitted 03/16/2013 with end stage changes bilateral knees and no relief with conservative care. 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 Bilateral total knee arthroplasty 03/17/2013 per Dr.Alusio. Placed on Coumadin for DVT prophylaxis with subcutaneous Lovenox for INR greater than 2.00. Advised weightbearing as tolerated. Acute blood loss anemia latest hemoglobin 7.2 and transfused with latest hemoglobin 9.3and monitored.   Subjective/Complaints:  Can I go home today No bowel issues    Objective: Vital Signs: Blood pressure 97/60, pulse 80, temperature 98.2 F (36.8 C), temperature source Oral, resp. rate 19, weight 101.878 kg (224 lb 9.6 oz), SpO2 96.00%. No results found. Results for orders placed during the hospital encounter of 03/22/13 (from the past 72 hour(s))  PROTIME-INR     Status: Abnormal   Collection Time    03/31/13  8:00 AM      Result Value Range   Prothrombin Time 23.6 (*) 11.6 - 15.2 seconds   INR 2.18 (*) 0.00 - 1.49  PROTIME-INR     Status: Abnormal   Collection Time    04/01/13  6:00 AM      Result Value Range   Prothrombin Time 24.9 (*) 11.6 - 15.2 seconds   INR 2.34 (*) 0.00 - 1.49  PROTIME-INR     Status: Abnormal   Collection Time    04/02/13  7:00 AM      Result Value Range   Prothrombin Time 25.7 (*) 11.6 - 15.2 seconds   INR 2.44 (*) 0.00 - 1.49  COMPREHENSIVE METABOLIC PANEL     Status: Abnormal   Collection Time    04/02/13  7:00 AM      Result Value Range   Sodium 135  135 - 145 mEq/L   Potassium 4.4  3.5 - 5.1 mEq/L   Chloride 98  96 - 112 mEq/L   CO2 30  19 - 32 mEq/L   Glucose, Bld 103 (*) 70 - 99 mg/dL   BUN 15  6 - 23 mg/dL   Creatinine, Ser 0.86  0.50 - 1.10 mg/dL   Calcium 9.2  8.4 - 57.8 mg/dL   Total Protein 6.8  6.0 - 8.3 g/dL   Albumin 2.4 (*) 3.5 - 5.2 g/dL   AST 27  0 - 37 U/L   ALT 43 (*) 0  - 35 U/L   Alkaline Phosphatase 108  39 - 117 U/L   Total Bilirubin 0.5  0.3 - 1.2 mg/dL   GFR calc non Af Amer 89 (*) >90 mL/min   GFR calc Af Amer >90  >90 mL/min   Comment:            The eGFR has been calculated     using the CKD EPI equation.     This calculation has not been     validated in all clinical     situations.     eGFR's persistently     <90 mL/min signify     possible Chronic Kidney Disease.     HEENT: normal Cardio: RRR Resp: CTA B/L GI: BS positive Extremity:  Edema pretibial 1+ Skin:   Intact and Wound C/D/I Neuro: Alert/Oriented Musc/Skel:  Swelling bilateral knees, no erythema Gen NAD   Assessment/Plan: 1. Functional deficits secondary to B TKR which require 3+ hours per day of interdisciplinary therapy in a comprehensive inpatient rehab setting. Stable for D/C  if Hgb is stable FIM: FIM - Bathing Bathing Steps Patient Completed: Chest;Right Arm;Left Arm;Abdomen;Front perineal area;Buttocks;Right upper leg;Left upper leg;Right lower leg (including foot);Left lower leg (including foot) Bathing: 6: More than reasonable amount of time  FIM - Upper Body Dressing/Undressing Upper body dressing/undressing steps patient completed: Thread/unthread right bra strap;Thread/unthread left bra strap;Hook/unhook bra;Thread/unthread right sleeve of pullover shirt/dresss;Thread/unthread left sleeve of pullover shirt/dress;Put head through opening of pull over shirt/dress;Pull shirt over trunk Upper body dressing/undressing: 7: Complete Independence: No helper FIM - Lower Body Dressing/Undressing Lower body dressing/undressing steps patient completed: Thread/unthread right underwear leg;Thread/unthread left underwear leg;Pull underwear up/down;Thread/unthread right pants leg;Thread/unthread left pants leg;Pull pants up/down;Don/Doff right sock;Don/Doff left sock;Don/Doff left shoe Lower body dressing/undressing: 4: Min-Patient completed 75 plus % of tasks  FIM -  Toileting Toileting steps completed by patient: Adjust clothing prior to toileting;Performs perineal hygiene;Adjust clothing after toileting Toileting Assistive Devices: Grab bar or rail for support Toileting: 6: More than reasonable amount of time  FIM - Diplomatic Services operational officer Devices: Walker;Elevated toilet seat Toilet Transfers: 6-More than reasonable amt of time;6-To toilet/ BSC;6-From toilet/BSC  FIM - Banker Devices: Therapist, occupational: 6: Assistive device: no helper  FIM - Locomotion: Wheelchair Distance: 100 Locomotion: Wheelchair: 0: Activity did not occur FIM - Locomotion: Ambulation Locomotion: Ambulation Assistive Devices: Designer, industrial/product Ambulation/Gait Assistance: 5: Supervision Locomotion: Ambulation: 5: Travels 150 ft or more with supervision/safety issues  Comprehension Comprehension Mode: Auditory Comprehension: 7-Follows complex conversation/direction: With no assist  Expression Expression Mode: Verbal Expression: 7-Expresses complex ideas: With no assist  Social Interaction Social Interaction: 7-Interacts appropriately with others - No medications needed.  Problem Solving Problem Solving: 7-Solves complex problems: Recognizes & self-corrects  Memory Memory: 7-Complete Independence: No helper  Medical Problem List and Plan:  1. 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 DVT Prophylaxis/Anticoagulation: Coumadin for DVT prophylaxis. Continue Lovenox for INR greater than 2.00. Monitor for any bleeding episodes. Check vascular study  3. Pain Management: Hydrocodone/Robaxin as needed. Monitor with increased mobility  4. Mood/depression. Celexa 10 mg daily. Provide emotional support  5. Neuropsych: This patient is capable of making decisions on her own behalf.  6. Postoperative anemia. Patient has been transfused. Continue iron  supplement. Followup CBC prior to D/C today since BP on low side 7. Hypertension. Maxzide daily. Monitor the increased mobility  8. Hyperlipidemia. Lipitor  9. GERD. Protonix  10.  Leukocytosis no fever, wound looks good, monitor,  UA- 11.  Increased AST,ALT,improved off lipitor, was on Crestor at home may resume and f/u with PCP  LOS (Days) 11 A FACE TO FACE EVALUATION WAS PERFORMED  Jennifer Sanford E 04/02/2013, 9:32 AM

## 2013-04-02 NOTE — Progress Notes (Signed)
Patient discharged to home at 1249 with daughter. [name], PA, provided discharge instructions. Patient and daughter verbalized understanding. Jennifer Sanford

## 2013-04-02 NOTE — Discharge Summary (Signed)
  Discharge summary job [id number]

## 2013-04-02 NOTE — Progress Notes (Signed)
Social Work Discharge Note Discharge Note  The overall goal for the admission was met for:   Discharge location: Yes-HOME WITH DAUGHTER'S ROTATING AND ASSISTING  Length of Stay: Yes-11 DAYS  Discharge activity level: Yes-MOD/I-SUPERVISION LEVEL  Home/community participation: Yes  Services provided included: MD, RD, PT, OT, RN, Pharmacy and SW  Financial Services: Private Insurance: [employer]  Follow-up services arranged: Home Health: [facility] -PT,OT,RN, DME: [employer]-WHEELCHAIR, [facility], BSC, CPM and Patient/Family request agency HH: [facility], DME: PT [facility]  Comments (or additional information):DID WELL-REQUESTED TO LEAVE DAY EARLIER DUE TO READY  Patient/Family verbalized understanding of follow-up arrangements: Yes  Individual responsible for coordination of the follow-up plan: SELF & REGINA-DAUGHTER  Confirmed correct DME delivered: Lucy Chris 04/02/2013    Lucy Chris

## 2013-04-23 ENCOUNTER — Ambulatory Visit: Payer: 59 | Attending: Orthopedic Surgery

## 2013-04-23 DIAGNOSIS — M25569 Pain in unspecified knee: Secondary | ICD-10-CM | POA: Insufficient documentation

## 2013-04-23 DIAGNOSIS — R262 Difficulty in walking, not elsewhere classified: Secondary | ICD-10-CM | POA: Insufficient documentation

## 2013-04-23 DIAGNOSIS — M25669 Stiffness of unspecified knee, not elsewhere classified: Secondary | ICD-10-CM | POA: Insufficient documentation

## 2013-04-23 DIAGNOSIS — IMO0001 Reserved for inherently not codable concepts without codable children: Secondary | ICD-10-CM | POA: Insufficient documentation

## 2013-04-26 ENCOUNTER — Ambulatory Visit: Payer: 59 | Admitting: Physical Therapy

## 2013-04-28 ENCOUNTER — Ambulatory Visit: Payer: 59 | Admitting: Physical Therapy

## 2013-04-29 ENCOUNTER — Ambulatory Visit: Payer: 59 | Admitting: Rehabilitation

## 2013-05-03 ENCOUNTER — Ambulatory Visit: Payer: 59 | Admitting: Rehabilitation

## 2013-05-05 ENCOUNTER — Ambulatory Visit: Payer: 59 | Admitting: Physical Therapy

## 2013-05-07 ENCOUNTER — Ambulatory Visit: Payer: 59 | Admitting: Rehabilitation

## 2013-05-10 ENCOUNTER — Ambulatory Visit: Payer: 59 | Admitting: Physical Therapy

## 2013-05-11 ENCOUNTER — Ambulatory Visit: Payer: 59 | Admitting: Physical Therapy

## 2013-05-12 ENCOUNTER — Ambulatory Visit: Payer: 59 | Admitting: Physical Therapy

## 2013-05-13 ENCOUNTER — Encounter: Payer: 59 | Admitting: Physical Therapy

## 2013-05-13 ENCOUNTER — Ambulatory Visit: Payer: 59 | Admitting: Physical Therapy

## 2013-05-17 ENCOUNTER — Encounter: Payer: 59 | Admitting: Physical Therapy

## 2013-05-18 ENCOUNTER — Ambulatory Visit: Payer: 59 | Admitting: Rehabilitation

## 2013-05-20 ENCOUNTER — Ambulatory Visit: Payer: 59 | Admitting: Physical Therapy

## 2013-05-21 ENCOUNTER — Ambulatory Visit: Payer: 59 | Admitting: Rehabilitation

## 2013-05-26 ENCOUNTER — Encounter: Payer: 59 | Admitting: Physical Therapy

## 2013-05-26 NOTE — Progress Notes (Signed)
NEED ORDERS IN EPIC PLEASE - PT COMING FOR PRE-OP THURS 05/27/13 - THANK YOU

## 2013-05-27 ENCOUNTER — Encounter (HOSPITAL_COMMUNITY): Payer: Self-pay | Admitting: Pharmacy Technician

## 2013-05-27 ENCOUNTER — Encounter (HOSPITAL_COMMUNITY): Payer: Self-pay

## 2013-05-27 ENCOUNTER — Encounter: Payer: 59 | Admitting: Physical Therapy

## 2013-05-27 ENCOUNTER — Encounter (HOSPITAL_COMMUNITY)
Admission: RE | Admit: 2013-05-27 | Discharge: 2013-05-27 | Disposition: A | Discharge status: Home / Self Care | Payer: 59 | Source: Ambulatory Visit | Attending: Orthopedic Surgery | Admitting: Orthopedic Surgery

## 2013-05-27 DIAGNOSIS — Z01812 Encounter for preprocedural laboratory examination: Secondary | ICD-10-CM | POA: Insufficient documentation

## 2013-05-27 LAB — BASIC METABOLIC PANEL
BUN: 24 mg/dL — ABNORMAL HIGH (ref 6–23)
CO2: 28 mEq/L (ref 19–32)
Calcium: 9.9 mg/dL (ref 8.4–10.5)
Chloride: 102 mEq/L (ref 96–112)
Creatinine, Ser: 1 mg/dL (ref 0.50–1.10)
GFR calc Af Amer: 70 mL/min — ABNORMAL LOW (ref >90)
GFR calc non Af Amer: 60 mL/min — ABNORMAL LOW (ref >90)
Glucose, Bld: 74 mg/dL (ref 70–99)
Potassium: 4.1 mEq/L (ref 3.5–5.1)
Sodium: 138 mEq/L (ref 135–145)

## 2013-05-27 LAB — CBC
HCT: 39.6 % (ref 36.0–46.0)
Hemoglobin: 12.7 g/dL (ref 12.0–15.0)
MCH: 27.5 pg (ref 26.0–34.0)
MCHC: 32.1 g/dL (ref 30.0–36.0)
MCV: 85.7 fL (ref 78.0–100.0)
Platelets: 294 10*3/uL (ref 150–400)
RBC: 4.62 MIL/uL (ref 3.87–5.11)
RDW: 14.6 % (ref 11.5–15.5)
WBC: 8 10*3/uL (ref 4.0–10.5)

## 2013-05-27 NOTE — Patient Instructions (Signed)
20 Jennifer Sanford  05/27/2013   Your procedure is scheduled on: 06/02/13  Report to [facility] at 2:00 PM.  Call this number if you have problems the morning of surgery [phone]   Remember:   Do not eat food After Midnight, clear liquids from midnight until 10:30 on 06/02/13 then nothing.      Take these medicines the morning of surgery with A SIP OF WATER: celexa, oxycodone if needed, protonix   Do not wear jewelry, make-up or nail polish.  Do not wear lotions, powders, or perfumes. You may wear deodorant.  Do not shave 48 hours prior to surgery. Men may shave face and neck.  Do not bring valuables to the hospital.  Contacts, dentures or bridgework may not be worn into surgery.    Patients discharged the day of surgery will not be allowed to drive home.  Name and phone number of your driver:Jennifer [phone]    Birdie Sons, RN  pre op nurse call if needed [phone]    FAILURE TO FOLLOW THESE INSTRUCTIONS MAY RESULT IN CANCELLATION OF YOUR SURGERY   Patient Signature: ___________________________________________

## 2013-05-27 NOTE — Progress Notes (Signed)
Chest x-ray 03/12/13 on EPIC, EKG 03/23/13 on EPIC

## 2013-06-01 DIAGNOSIS — T8489XA Other specified complication of internal orthopedic prosthetic devices, implants and grafts, initial encounter: Secondary | ICD-10-CM

## 2013-06-01 DIAGNOSIS — M25669 Stiffness of unspecified knee, not elsewhere classified: Secondary | ICD-10-CM

## 2013-06-01 DIAGNOSIS — Z96659 Presence of unspecified artificial knee joint: Secondary | ICD-10-CM

## 2013-06-01 NOTE — H&P (Signed)
  CC- Jennifer Sanford is a 62 y.o. female who presents with bilateral knee stiffness.  HPI- . Knee Pain: Patient presents with stiffness involving the  bilateral knee. Onset of the symptoms was several months ago. She had bilateral knee replacements on 03/17/13 and has progressed well from a pain relief standpoint but has had significant stiffness in both knees despite prolonged physical therapy.  Past Medical History  Diagnosis Date  . Hypertension   . Hypercholesteremia   . Arthritis   . Depression   . GERD (gastroesophageal reflux disease)     Past Surgical History  Procedure Laterality Date  . Knee arthroscopy Left 2004  . Abdominal hysterectomy  2002    partial  . Breast reduction surgery  2007  . Tonsillectomy  as child  . Total knee arthroplasty Bilateral 03/17/2013    Procedure: TOTAL KNEE BILATERAL;  Surgeon: Loanne Drilling, MD;  Location: [facility];  Service: Orthopedics;  Laterality: Bilateral;    Prior to Admission medications   Medication Sig Start Date End Date Taking? Authorizing Provider  aspirin EC 81 MG tablet Take 81 mg by mouth daily.    Historical Provider, MD  citalopram (CELEXA) 10 MG tablet Take 1 tablet (10 mg total) by mouth every morning. 04/02/13   Mcarthur Rossetti Angiulli, PA-C  methocarbamol (ROBAXIN) 500 MG tablet Take 500 mg by mouth every 6 (six) hours as needed (pain).    Historical Provider, MD  oxycodone (OXY-IR) 5 MG capsule Take 5 mg by mouth every 4 (four) hours as needed for pain.    Historical Provider, MD  pantoprazole (PROTONIX) 40 MG tablet Take 40 mg by mouth daily.    Historical Provider, MD  traMADol (ULTRAM) 50 MG tablet Take 1-2 tablets (50-100 mg total) by mouth every 6 (six) hours as needed (mild pain). 04/02/13   Mcarthur Rossetti Angiulli, PA-C  triamterene-hydrochlorothiazide (MAXZIDE-25) 37.5-25 MG per tablet Take 0.5 tablets by mouth every morning. 04/02/13   Mcarthur Rossetti Angiulli, PA-C  vitamin B-12 (CYANOCOBALAMIN) 1000 MCG tablet Take 1,000 mcg by mouth  daily.    Historical Provider, MD   KNEE EXAM antalgic gait, effusion, Flexion approximately 90 degrees in each knee  Physical Examination: General appearance - alert, well appearing, and in no distress Mental status - alert, oriented to person, place, and time Chest - clear to auscultation, no wheezes, rales or rhonchi, symmetric air entry Heart - normal rate, regular rhythm, normal S1, S2, no murmurs, rubs, clicks or gallops Abdomen - soft, nontender, nondistended, no masses or organomegaly Neurological - alert, oriented, normal speech, no focal findings or movement disorder noted   Asessment/Plan--- Bilateral knee arthrofibrosis- - Plan bilaterat knee closed manipulationt. Procedure risks and potential comps discussed with patient who elects to proceed. Goals are decreased pain and increased function with a high likelihood of achieving both

## 2013-06-02 ENCOUNTER — Encounter (HOSPITAL_COMMUNITY): Payer: Self-pay | Admitting: Anesthesiology

## 2013-06-02 ENCOUNTER — Encounter (HOSPITAL_COMMUNITY): Payer: Self-pay

## 2013-06-02 ENCOUNTER — Ambulatory Visit (HOSPITAL_COMMUNITY): Payer: 59 | Admitting: Anesthesiology

## 2013-06-02 ENCOUNTER — Ambulatory Visit (HOSPITAL_COMMUNITY)
Admission: RE | Admit: 2013-06-02 | Discharge: 2013-06-02 | Disposition: A | Discharge status: Home / Self Care | Payer: 59 | Source: Ambulatory Visit | Attending: Orthopedic Surgery | Admitting: Orthopedic Surgery

## 2013-06-02 ENCOUNTER — Encounter (HOSPITAL_COMMUNITY)
Admission: RE | Disposition: A | Discharge status: Home / Self Care | Payer: Self-pay | Source: Ambulatory Visit | Attending: Orthopedic Surgery

## 2013-06-02 DIAGNOSIS — M24669 Ankylosis, unspecified knee: Secondary | ICD-10-CM | POA: Insufficient documentation

## 2013-06-02 DIAGNOSIS — E7800 Pure hypercholesterolemia, unspecified: Secondary | ICD-10-CM | POA: Insufficient documentation

## 2013-06-02 DIAGNOSIS — Z7982 Long term (current) use of aspirin: Secondary | ICD-10-CM | POA: Insufficient documentation

## 2013-06-02 DIAGNOSIS — Z96659 Presence of unspecified artificial knee joint: Secondary | ICD-10-CM

## 2013-06-02 DIAGNOSIS — I10 Essential (primary) hypertension: Secondary | ICD-10-CM | POA: Insufficient documentation

## 2013-06-02 DIAGNOSIS — Z79899 Other long term (current) drug therapy: Secondary | ICD-10-CM | POA: Insufficient documentation

## 2013-06-02 DIAGNOSIS — M2469 Ankylosis, other specified joint: Secondary | ICD-10-CM | POA: Insufficient documentation

## 2013-06-02 DIAGNOSIS — T8489XA Other specified complication of internal orthopedic prosthetic devices, implants and grafts, initial encounter: Secondary | ICD-10-CM

## 2013-06-02 DIAGNOSIS — M25669 Stiffness of unspecified knee, not elsewhere classified: Secondary | ICD-10-CM

## 2013-06-02 DIAGNOSIS — K219 Gastro-esophageal reflux disease without esophagitis: Secondary | ICD-10-CM | POA: Insufficient documentation

## 2013-06-02 HISTORY — PX: KNEE CLOSED REDUCTION: SHX995

## 2013-06-02 SURGERY — MANIPULATION, KNEE, CLOSED
Anesthesia: General | Site: Knee | Laterality: Bilateral | Wound class: Clean

## 2013-06-02 MED ORDER — SODIUM CHLORIDE 0.9 % IV SOLN: INTRAVENOUS | Status: DC

## 2013-06-02 MED ORDER — FENTANYL CITRATE 0.05 MG/ML IJ SOLN
INTRAMUSCULAR | Status: DC | PRN
  Administered 2013-06-02 (×2): 50 ug via INTRAVENOUS

## 2013-06-02 MED ORDER — MIDAZOLAM HCL 5 MG/5ML IJ SOLN
INTRAMUSCULAR | Status: DC | PRN
  Administered 2013-06-02: 2 mg via INTRAVENOUS

## 2013-06-02 MED ORDER — PROMETHAZINE HCL 25 MG/ML IJ SOLN: 6.2500 mg | INTRAMUSCULAR | Status: DC | PRN

## 2013-06-02 MED ORDER — HYDROMORPHONE HCL PF 1 MG/ML IJ SOLN
INTRAMUSCULAR | Status: AC
  Filled 2013-06-02: qty 1

## 2013-06-02 MED ORDER — ONDANSETRON HCL 4 MG/2ML IJ SOLN
INTRAMUSCULAR | Status: DC | PRN
  Administered 2013-06-02: 4 mg via INTRAVENOUS

## 2013-06-02 MED ORDER — HYDROMORPHONE HCL PF 1 MG/ML IJ SOLN
0.2500 mg | INTRAMUSCULAR | Status: DC | PRN
  Administered 2013-06-02 (×3): 0.5 mg via INTRAVENOUS

## 2013-06-02 MED ORDER — METHOCARBAMOL 100 MG/ML IJ SOLN
500.0000 mg | Freq: Once | INTRAVENOUS | Status: AC
  Administered 2013-06-02: 500 mg via INTRAVENOUS
  Filled 2013-06-02: qty 5

## 2013-06-02 MED ORDER — METHOCARBAMOL 500 MG PO TABS: 500.0000 mg | ORAL_TABLET | Freq: Four times a day (QID) | ORAL | Status: DC | PRN

## 2013-06-02 MED ORDER — LACTATED RINGERS IV SOLN
INTRAVENOUS | Status: DC
  Administered 2013-06-02: 1000 mL via INTRAVENOUS

## 2013-06-02 MED ORDER — PROPOFOL 10 MG/ML IV BOLUS
INTRAVENOUS | Status: DC | PRN
  Administered 2013-06-02: 150 mg via INTRAVENOUS

## 2013-06-02 SURGICAL SUPPLY — 14 items
BANDAGE ADHESIVE 1X3 (GAUZE/BANDAGES/DRESSINGS) IMPLANT
CLOTH BEACON ORANGE TIMEOUT ST (SAFETY) ×1 IMPLANT
GLOVE BIO SURGEON STRL SZ7.5 (GLOVE) ×1 IMPLANT
GLOVE BIOGEL PI IND STRL 8 (GLOVE) ×1 IMPLANT
GLOVE BIOGEL PI INDICATOR 8 (GLOVE)
GOWN STRL NON-REIN LRG LVL3 (GOWN DISPOSABLE) ×1 IMPLANT
GOWN STRL REIN XL XLG (GOWN DISPOSABLE) ×1 IMPLANT
MANIFOLD NEPTUNE II (INSTRUMENTS) ×1 IMPLANT
NDL SAFETY ECLIPSE 18X1.5 (NEEDLE) IMPLANT
NEEDLE HYPO 18GX1.5 SHARP (NEEDLE)
POSITIONER SURGICAL ARM (MISCELLANEOUS) ×1 IMPLANT
SPONGE GAUZE 4X4 12PLY (GAUZE/BANDAGES/DRESSINGS) IMPLANT
SYR CONTROL 10ML LL (SYRINGE) IMPLANT
TOWEL OR 17X26 10 PK STRL BLUE (TOWEL DISPOSABLE) ×2 IMPLANT

## 2013-06-02 NOTE — Interval H&P Note (Signed)
History and Physical Interval Note:  06/02/2013 3:36 PM  Jennifer Sanford  has presented today for surgery, with the diagnosis of BILATERAL KNEE ARTHROFIBROSIS  The various methods of treatment have been discussed with the patient and family. After consideration of risks, benefits and other options for treatment, the patient has consented to  Procedure(s): CLOSED MANIPULATION BILATERAL KNEES (Bilateral) as a surgical intervention .  The patient's history has been reviewed, patient examined, no change in status, stable for surgery.  I have reviewed the patient's chart and labs.  Questions were answered to the patient's satisfaction.     Loanne Drilling

## 2013-06-02 NOTE — Anesthesia Postprocedure Evaluation (Signed)
  Anesthesia Post-op Note  Patient: Jennifer Sanford  Procedure(s) Performed: Procedure(s) (LRB): CLOSED MANIPULATION BILATERAL KNEES (Bilateral)  Patient Location: PACU  Anesthesia Type: General  Level of Consciousness: awake and alert   Airway and Oxygen Therapy: Patient Spontanous Breathing  Post-op Pain: mild  Post-op Assessment: Post-op Vital signs reviewed, Patient's Cardiovascular Status Stable, Respiratory Function Stable, Patent Airway and No signs of Nausea or vomiting  Last Vitals:  Filed Vitals:   06/02/13 1645  BP: 125/65  Pulse: 68  Temp:   Resp: 13    Post-op Vital Signs: stable   Complications: No apparent anesthesia complications

## 2013-06-02 NOTE — Anesthesia Preprocedure Evaluation (Signed)
Anesthesia Evaluation  Patient identified by MRN, date of birth, ID band Patient awake    Reviewed: Allergy & Precautions, H&P , NPO status , Patient's Chart, lab work & pertinent test results  Airway Mallampati: II TM Distance: >3 FB Neck ROM: Full    Dental no notable dental hx.    Pulmonary neg pulmonary ROS,  breath sounds clear to auscultation  Pulmonary exam normal       Cardiovascular hypertension, Pt. on medications negative cardio ROS  Rhythm:Regular Rate:Normal     Neuro/Psych PSYCHIATRIC DISORDERS Depression  Neuromuscular disease    GI/Hepatic Neg liver ROS, GERD-  Medicated,  Endo/Other  negative endocrine ROS  Renal/GU negative Renal ROS  negative genitourinary   Musculoskeletal negative musculoskeletal ROS (+)   Abdominal (+) + obese,   Peds negative pediatric ROS (+)  Hematology negative hematology ROS (+)   Anesthesia Other Findings   Reproductive/Obstetrics negative OB ROS                           Anesthesia Physical Anesthesia Plan  ASA: II  Anesthesia Plan: General   Post-op Pain Management:    Induction: Intravenous  Airway Management Planned: LMA  Additional Equipment:   Intra-op Plan:   Post-operative Plan: Extubation in OR  Informed Consent: I have reviewed the patients History and Physical, chart, labs and discussed the procedure including the risks, benefits and alternatives for the proposed anesthesia with the patient or authorized representative who has indicated his/her understanding and acceptance.   Dental advisory given  Plan Discussed with: CRNA  Anesthesia Plan Comments:         Anesthesia Quick Evaluation

## 2013-06-02 NOTE — Op Note (Signed)
  OPERATIVE REPORT   PREOPERATIVE DIAGNOSIS: Arthrofibrosis, Bilateral  knees.   POSTOPERATIVE DIAGNOSIS: Arthrofibrosis, Bilateral knees.   PROCEDURE:  Bilateral  knee closed manipulation.   SURGEON: Ollen Gross, MD   ASSISTANT: None.   ANESTHESIA: General.   COMPLICATIONS: None.   CONDITION: Stable to Recovery.   BRIEF CLINICAL NOTE:- Jennifer Sanford is a 62 yo female who underwent bilateral TKA approximately 3 months ago and has had considerable stiffness in flexion despite adequate effort with Physical Therapy. She presents now for bilateral knee closed manipulation  Pre-manipulation range of motion is Left 5-95; right 5-90.  Post-manipulation range of  Motion is Left 0-125; right 5-125  PROCEDURE IN DETAIL: After successful administration of general  anesthetic, exam under anesthesia was performed showing range of motion  5-90 on the right and 5-95 degrees on the left. I then placed my chest against the proximal tibia of the right knee first,  flexing the knee with audible lysis of adhesions. I was easily able to  get the knee flexed to 125  degrees. I then put the knee back in extension and with some  patellar manipulation and gentle pressure got within 5 degrees of full  Extension. I then placed my chest against the proximal tibia of the left knee,  flexing the knee with audible lysis of adhesions. I was easily able to  get the knee flexed to 125  degrees. I then put the knee back in extension and with some  patellar manipulation and gentle pressure got to full extension. The patient was subsequently awakened and transported to Recovery in  stable condition.

## 2013-06-02 NOTE — Transfer of Care (Signed)
Immediate Anesthesia Transfer of Care Note  Patient: Jennifer Sanford  Procedure(s) Performed: Procedure(s) (LRB): CLOSED MANIPULATION BILATERAL KNEES (Bilateral)  Patient Location: PACU  Anesthesia Type: General  Level of Consciousness: sedated, patient cooperative and responds to stimulaton  Airway & Oxygen Therapy: Patient Spontanous Breathing and Patient connected to face mask oxgen  Post-op Assessment: Report given to PACU RN and Post -op Vital signs reviewed and stable  Post vital signs: Reviewed and stable  Complications: No apparent anesthesia complications

## 2013-06-02 NOTE — Preoperative (Signed)
Beta Blockers   Reason not to administer Beta Blockers:Not Applicable 

## 2013-06-03 ENCOUNTER — Encounter (HOSPITAL_COMMUNITY): Payer: Self-pay | Admitting: Orthopedic Surgery

## 2013-06-03 ENCOUNTER — Ambulatory Visit: Payer: 59 | Attending: Orthopedic Surgery | Admitting: Physical Therapy

## 2013-06-03 DIAGNOSIS — M25669 Stiffness of unspecified knee, not elsewhere classified: Secondary | ICD-10-CM | POA: Insufficient documentation

## 2013-06-03 DIAGNOSIS — R262 Difficulty in walking, not elsewhere classified: Secondary | ICD-10-CM | POA: Insufficient documentation

## 2013-06-03 DIAGNOSIS — M25569 Pain in unspecified knee: Secondary | ICD-10-CM | POA: Insufficient documentation

## 2013-06-03 DIAGNOSIS — IMO0001 Reserved for inherently not codable concepts without codable children: Secondary | ICD-10-CM | POA: Insufficient documentation

## 2013-06-04 ENCOUNTER — Ambulatory Visit: Payer: 59 | Admitting: Rehabilitation

## 2013-06-07 ENCOUNTER — Ambulatory Visit: Payer: 59 | Admitting: Physical Therapy

## 2013-06-09 ENCOUNTER — Ambulatory Visit: Payer: 59 | Admitting: Rehabilitation

## 2013-06-10 ENCOUNTER — Ambulatory Visit: Payer: 59 | Admitting: Physical Therapy

## 2013-06-14 ENCOUNTER — Ambulatory Visit: Payer: 59 | Admitting: Physical Therapy

## 2013-06-16 ENCOUNTER — Ambulatory Visit: Payer: 59 | Admitting: Physical Therapy

## 2013-06-17 ENCOUNTER — Ambulatory Visit: Payer: 59 | Admitting: Physical Therapy

## 2013-06-21 ENCOUNTER — Ambulatory Visit: Payer: 59 | Admitting: Physical Therapy

## 2013-06-21 ENCOUNTER — Ambulatory Visit: Payer: 59 | Admitting: Rehabilitation

## 2013-06-23 ENCOUNTER — Ambulatory Visit: Payer: 59 | Attending: Orthopedic Surgery | Admitting: Rehabilitation

## 2013-06-23 DIAGNOSIS — M25569 Pain in unspecified knee: Secondary | ICD-10-CM | POA: Insufficient documentation

## 2013-06-23 DIAGNOSIS — R262 Difficulty in walking, not elsewhere classified: Secondary | ICD-10-CM | POA: Insufficient documentation

## 2013-06-23 DIAGNOSIS — M25669 Stiffness of unspecified knee, not elsewhere classified: Secondary | ICD-10-CM | POA: Insufficient documentation

## 2013-06-23 DIAGNOSIS — IMO0001 Reserved for inherently not codable concepts without codable children: Secondary | ICD-10-CM | POA: Insufficient documentation

## 2013-06-24 ENCOUNTER — Ambulatory Visit: Payer: 59 | Admitting: Physical Therapy

## 2013-06-24 ENCOUNTER — Encounter: Payer: 59 | Admitting: Physical Therapy

## 2013-06-28 ENCOUNTER — Ambulatory Visit: Payer: 59 | Admitting: Physical Therapy

## 2013-06-28 ENCOUNTER — Ambulatory Visit: Payer: 59 | Admitting: Rehabilitation

## 2013-06-30 ENCOUNTER — Ambulatory Visit: Payer: 59 | Admitting: Physical Therapy

## 2013-07-01 ENCOUNTER — Ambulatory Visit: Payer: 59 | Admitting: Rehabilitation

## 2013-07-05 ENCOUNTER — Ambulatory Visit: Payer: 59 | Admitting: Rehabilitation

## 2013-07-07 ENCOUNTER — Ambulatory Visit: Payer: 59 | Admitting: Rehabilitation

## 2013-07-08 ENCOUNTER — Ambulatory Visit: Payer: 59 | Admitting: Physical Therapy

## 2013-07-12 ENCOUNTER — Ambulatory Visit: Payer: 59 | Admitting: Physical Therapy

## 2013-07-14 ENCOUNTER — Ambulatory Visit: Payer: 59 | Admitting: Physical Therapy

## 2013-07-15 ENCOUNTER — Ambulatory Visit: Payer: 59 | Admitting: Rehabilitation

## 2013-07-19 ENCOUNTER — Ambulatory Visit: Payer: 59 | Admitting: Physical Therapy

## 2013-07-21 ENCOUNTER — Ambulatory Visit: Payer: 59 | Admitting: Physical Therapy

## 2013-07-22 ENCOUNTER — Ambulatory Visit: Payer: 59 | Admitting: Rehabilitation

## 2013-07-26 ENCOUNTER — Ambulatory Visit: Payer: 59 | Attending: Orthopedic Surgery | Admitting: Physical Therapy

## 2013-07-26 DIAGNOSIS — M25569 Pain in unspecified knee: Secondary | ICD-10-CM | POA: Insufficient documentation

## 2013-07-26 DIAGNOSIS — R262 Difficulty in walking, not elsewhere classified: Secondary | ICD-10-CM | POA: Insufficient documentation

## 2013-07-26 DIAGNOSIS — IMO0001 Reserved for inherently not codable concepts without codable children: Secondary | ICD-10-CM | POA: Insufficient documentation

## 2013-07-26 DIAGNOSIS — M25669 Stiffness of unspecified knee, not elsewhere classified: Secondary | ICD-10-CM | POA: Insufficient documentation

## 2013-07-28 ENCOUNTER — Ambulatory Visit: Payer: 59 | Admitting: Physical Therapy

## 2013-07-29 ENCOUNTER — Ambulatory Visit: Payer: 59 | Admitting: Physical Therapy

## 2013-07-30 ENCOUNTER — Other Ambulatory Visit: Payer: Self-pay

## 2013-07-30 DIAGNOSIS — Z1231 Encounter for screening mammogram for malignant neoplasm of breast: Secondary | ICD-10-CM

## 2013-08-02 ENCOUNTER — Ambulatory Visit: Payer: 59 | Admitting: Physical Therapy

## 2013-08-03 ENCOUNTER — Ambulatory Visit: Payer: 59 | Admitting: Physical Therapy

## 2013-08-05 ENCOUNTER — Ambulatory Visit: Payer: 59 | Admitting: Physical Therapy

## 2013-08-09 ENCOUNTER — Ambulatory Visit: Payer: 59 | Admitting: Rehabilitation

## 2013-08-11 ENCOUNTER — Encounter: Payer: 59 | Admitting: Physical Therapy

## 2013-08-12 ENCOUNTER — Encounter: Payer: 59 | Admitting: Physical Therapy

## 2013-08-31 ENCOUNTER — Ambulatory Visit: Payer: 59

## 2013-09-01 ENCOUNTER — Ambulatory Visit: Payer: 59

## 2013-10-01 ENCOUNTER — Ambulatory Visit: Payer: 59

## 2013-10-20 ENCOUNTER — Ambulatory Visit
Admission: RE | Admit: 2013-10-20 | Discharge: 2013-10-20 | Disposition: A | Discharge status: Home / Self Care | Payer: Self-pay | Source: Ambulatory Visit

## 2013-10-20 DIAGNOSIS — Z1231 Encounter for screening mammogram for malignant neoplasm of breast: Secondary | ICD-10-CM

## 2013-12-16 ENCOUNTER — Encounter: Payer: Self-pay | Admitting: *Deleted

## 2014-02-17 ENCOUNTER — Other Ambulatory Visit: Payer: Self-pay | Admitting: Urology

## 2014-02-17 ENCOUNTER — Encounter (HOSPITAL_BASED_OUTPATIENT_CLINIC_OR_DEPARTMENT_OTHER): Payer: Self-pay | Admitting: *Deleted

## 2014-02-17 NOTE — Progress Notes (Signed)
NPO AFTER MN. ARRIVE AT 0730.  NEEDS ISTAT 8.  CURRENT EKG IN CHART AND EPIC.  WILL TAKE PROTONIX AND TYLENOL AM DOS W/ SIPS OF WATER.

## 2014-02-18 ENCOUNTER — Encounter (HOSPITAL_BASED_OUTPATIENT_CLINIC_OR_DEPARTMENT_OTHER): Payer: 59 | Admitting: Anesthesiology

## 2014-02-18 ENCOUNTER — Encounter (HOSPITAL_BASED_OUTPATIENT_CLINIC_OR_DEPARTMENT_OTHER): Payer: Self-pay

## 2014-02-18 ENCOUNTER — Encounter (HOSPITAL_BASED_OUTPATIENT_CLINIC_OR_DEPARTMENT_OTHER)
Admission: RE | Disposition: A | Discharge status: Home / Self Care | Payer: Self-pay | Source: Ambulatory Visit | Attending: Urology

## 2014-02-18 ENCOUNTER — Ambulatory Visit (HOSPITAL_BASED_OUTPATIENT_CLINIC_OR_DEPARTMENT_OTHER): Payer: 59 | Admitting: Anesthesiology

## 2014-02-18 ENCOUNTER — Ambulatory Visit (HOSPITAL_BASED_OUTPATIENT_CLINIC_OR_DEPARTMENT_OTHER)
Admission: RE | Admit: 2014-02-18 | Discharge: 2014-02-18 | Disposition: A | Discharge status: Home / Self Care | Payer: 59 | Source: Ambulatory Visit | Attending: Urology | Admitting: Urology

## 2014-02-18 ENCOUNTER — Ambulatory Visit (HOSPITAL_COMMUNITY): Payer: 59

## 2014-02-18 DIAGNOSIS — N135 Crossing vessel and stricture of ureter without hydronephrosis: Secondary | ICD-10-CM | POA: Insufficient documentation

## 2014-02-18 DIAGNOSIS — E785 Hyperlipidemia, unspecified: Secondary | ICD-10-CM | POA: Insufficient documentation

## 2014-02-18 DIAGNOSIS — Z79899 Other long term (current) drug therapy: Secondary | ICD-10-CM | POA: Insufficient documentation

## 2014-02-18 DIAGNOSIS — N1330 Unspecified hydronephrosis: Secondary | ICD-10-CM | POA: Insufficient documentation

## 2014-02-18 DIAGNOSIS — I10 Essential (primary) hypertension: Secondary | ICD-10-CM | POA: Insufficient documentation

## 2014-02-18 DIAGNOSIS — M129 Arthropathy, unspecified: Secondary | ICD-10-CM | POA: Insufficient documentation

## 2014-02-18 HISTORY — DX: Adverse effect of unspecified anesthetic, initial encounter: T41.45XA

## 2014-02-18 HISTORY — DX: Other complications of anesthesia, initial encounter: T88.59XA

## 2014-02-18 HISTORY — DX: Unspecified osteoarthritis, unspecified site: M19.90

## 2014-02-18 HISTORY — PX: CYSTOSCOPY WITH STENT PLACEMENT: SHX5790

## 2014-02-18 LAB — POCT I-STAT, CHEM 8
BUN: 20 mg/dL (ref 6–23)
Calcium, Ion: 1.26 mmol/L (ref 1.13–1.30)
Chloride: 104 mEq/L (ref 96–112)
Creatinine, Ser: 1.2 mg/dL — ABNORMAL HIGH (ref 0.50–1.10)
Glucose, Bld: 89 mg/dL (ref 70–99)
HCT: 39 % (ref 36.0–46.0)
Hemoglobin: 13.3 g/dL (ref 12.0–15.0)
Potassium: 3.7 mEq/L (ref 3.7–5.3)
Sodium: 143 mEq/L (ref 137–147)
TCO2: 24 mmol/L (ref 0–100)

## 2014-02-18 SURGERY — CYSTOSCOPY, WITH STENT INSERTION
Anesthesia: General | Site: Ureter | Laterality: Right

## 2014-02-18 MED ORDER — LACTATED RINGERS IV SOLN
INTRAVENOUS | Status: DC
  Administered 2014-02-18: 08:00:00 via INTRAVENOUS
  Filled 2014-02-18: qty 1000

## 2014-02-18 MED ORDER — LIDOCAINE HCL (CARDIAC) 20 MG/ML IV SOLN
INTRAVENOUS | Status: DC | PRN
  Administered 2014-02-18: 80 mg via INTRAVENOUS

## 2014-02-18 MED ORDER — FENTANYL CITRATE 0.05 MG/ML IJ SOLN
INTRAMUSCULAR | Status: DC | PRN
  Administered 2014-02-18 (×2): 50 ug via INTRAVENOUS

## 2014-02-18 MED ORDER — PROPOFOL 10 MG/ML IV BOLUS
INTRAVENOUS | Status: DC | PRN
  Administered 2014-02-18: 200 mg via INTRAVENOUS

## 2014-02-18 MED ORDER — MEPERIDINE HCL 25 MG/ML IJ SOLN
6.2500 mg | INTRAMUSCULAR | Status: DC | PRN
  Filled 2014-02-18: qty 1

## 2014-02-18 MED ORDER — OXYCODONE HCL 5 MG PO TABS
5.0000 mg | ORAL_TABLET | Freq: Once | ORAL | Status: DC | PRN
  Filled 2014-02-18: qty 1

## 2014-02-18 MED ORDER — OXYCODONE HCL 5 MG/5ML PO SOLN
5.0000 mg | Freq: Once | ORAL | Status: DC | PRN
  Filled 2014-02-18: qty 5

## 2014-02-18 MED ORDER — MIDAZOLAM HCL 5 MG/5ML IJ SOLN
INTRAMUSCULAR | Status: DC | PRN
  Administered 2014-02-18: 2 mg via INTRAVENOUS

## 2014-02-18 MED ORDER — PROMETHAZINE HCL 25 MG/ML IJ SOLN
6.2500 mg | INTRAMUSCULAR | Status: DC | PRN
  Filled 2014-02-18: qty 1

## 2014-02-18 MED ORDER — CEFAZOLIN SODIUM-DEXTROSE 2-3 GM-% IV SOLR
2.0000 g | INTRAVENOUS | Status: AC
  Administered 2014-02-18: 2 g via INTRAVENOUS
  Filled 2014-02-18: qty 50

## 2014-02-18 MED ORDER — DEXAMETHASONE SODIUM PHOSPHATE 4 MG/ML IJ SOLN
INTRAMUSCULAR | Status: DC | PRN
  Administered 2014-02-18: 10 mg via INTRAVENOUS

## 2014-02-18 MED ORDER — MIDAZOLAM HCL 2 MG/2ML IJ SOLN
INTRAMUSCULAR | Status: AC
  Filled 2014-02-18: qty 2

## 2014-02-18 MED ORDER — ONDANSETRON HCL 4 MG/2ML IJ SOLN
INTRAMUSCULAR | Status: DC | PRN
  Administered 2014-02-18: 4 mg via INTRAVENOUS

## 2014-02-18 MED ORDER — HYDROMORPHONE HCL PF 1 MG/ML IJ SOLN
0.2500 mg | INTRAMUSCULAR | Status: DC | PRN
  Filled 2014-02-18: qty 1

## 2014-02-18 MED ORDER — GENTAMICIN IN SALINE 1.6-0.9 MG/ML-% IV SOLN
80.0000 mg | INTRAVENOUS | Status: AC
  Administered 2014-02-18: 400 mg via INTRAVENOUS
  Filled 2014-02-18: qty 50

## 2014-02-18 MED ORDER — FENTANYL CITRATE 0.05 MG/ML IJ SOLN
INTRAMUSCULAR | Status: AC
  Filled 2014-02-18: qty 4

## 2014-02-18 MED ORDER — CEFAZOLIN SODIUM 1-5 GM-% IV SOLN
1.0000 g | INTRAVENOUS | Status: DC
  Filled 2014-02-18: qty 50

## 2014-02-18 MED ORDER — IOHEXOL 350 MG/ML SOLN
INTRAVENOUS | Status: DC | PRN
  Administered 2014-02-18: 6 mL

## 2014-02-18 MED ORDER — ACETAMINOPHEN 10 MG/ML IV SOLN
INTRAVENOUS | Status: DC | PRN
  Administered 2014-02-18: 1000 mg via INTRAVENOUS

## 2014-02-18 MED ORDER — KETOROLAC TROMETHAMINE 30 MG/ML IJ SOLN
INTRAMUSCULAR | Status: DC | PRN
  Administered 2014-02-18: 30 mg via INTRAVENOUS

## 2014-02-18 MED ORDER — STERILE WATER FOR IRRIGATION IR SOLN
Status: DC | PRN
  Administered 2014-02-18: 3000 mL via INTRAVESICAL

## 2014-02-18 SURGICAL SUPPLY — 19 items
BAG DRAIN URO-CYSTO SKYTR STRL (DRAIN) ×2 IMPLANT
BAG DRN UROCATH (DRAIN) ×1
CANISTER SUCT LVC 12 LTR MEDI- (MISCELLANEOUS) ×1 IMPLANT
CATH INTERMIT  6FR 70CM (CATHETERS) ×1 IMPLANT
CATH URET 5FR 28IN CONE TIP (BALLOONS)
CATH URET 5FR 70CM CONE TIP (BALLOONS) IMPLANT
CLOTH BEACON ORANGE TIMEOUT ST (SAFETY) ×2 IMPLANT
DRAPE CAMERA CLOSED 9X96 (DRAPES) IMPLANT
GLOVE BIO SURGEON STRL SZ 6.5 (GLOVE) ×1 IMPLANT
GLOVE BIO SURGEON STRL SZ7 (GLOVE) ×2 IMPLANT
GLOVE INDICATOR 6.5 STRL GRN (GLOVE) ×2 IMPLANT
GOWN STRL REUS W/ TWL XL LVL3 (GOWN DISPOSABLE) IMPLANT
GOWN STRL REUS W/TWL XL LVL3 (GOWN DISPOSABLE) ×4
GUIDEWIRE 0.038 PTFE COATED (WIRE) ×2 IMPLANT
GUIDEWIRE ANG ZIPWIRE 038X150 (WIRE) ×1 IMPLANT
GUIDEWIRE STR DUAL SENSOR (WIRE) ×1 IMPLANT
NS IRRIG 500ML POUR BTL (IV SOLUTION) IMPLANT
PACK CYSTOSCOPY (CUSTOM PROCEDURE TRAY) ×2 IMPLANT
STENT CONTOUR 8FR X 24 (STENTS) ×1 IMPLANT

## 2014-02-18 NOTE — Transfer of Care (Signed)
Immediate Anesthesia Transfer of Care Note  Patient: Jennifer Sanford  Procedure(s) Performed: Procedure(s) (LRB): CYSTOSCOPY WITH right STENT PLACEMENT (Right)  Patient Location: PACU  Anesthesia Type: General  Level of Consciousness: awake, alert  and oriented  Airway & Oxygen Therapy: Patient Spontanous Breathing and Patient connected to nasal cannula oxygen  Post-op Assessment: Report given to PACU RN and Post -op Vital signs reviewed and stable  Post vital signs: Reviewed and stable  Complications: No apparent anesthesia complications

## 2014-02-18 NOTE — Anesthesia Preprocedure Evaluation (Signed)
Anesthesia Evaluation  Patient identified by MRN, date of birth, ID band Patient awake    Reviewed: Allergy & Precautions, H&P , NPO status , Patient's Chart, lab work & pertinent test results  History of Anesthesia Complications Negative for: history of anesthetic complications  Airway Mallampati: II TM Distance: >3 FB Neck ROM: Full    Dental no notable dental hx.    Pulmonary neg pulmonary ROS,  breath sounds clear to auscultation  Pulmonary exam normal       Cardiovascular hypertension, Pt. on medications negative cardio ROS  Rhythm:Regular Rate:Normal     Neuro/Psych PSYCHIATRIC DISORDERS Depression  Neuromuscular disease    GI/Hepatic Neg liver ROS, GERD-  Medicated,  Endo/Other  negative endocrine ROS  Renal/GU Renal disease     Musculoskeletal negative musculoskeletal ROS (+)   Abdominal (+) + obese,   Peds  Hematology  (+) anemia ,   Anesthesia Other Findings   Reproductive/Obstetrics                           Anesthesia Physical  Anesthesia Plan  ASA: II  Anesthesia Plan: General   Post-op Pain Management:    Induction: Intravenous  Airway Management Planned: LMA  Additional Equipment:   Intra-op Plan:   Post-operative Plan: Extubation in OR  Informed Consent: I have reviewed the patients History and Physical, chart, labs and discussed the procedure including the risks, benefits and alternatives for the proposed anesthesia with the patient or authorized representative who has indicated his/her understanding and acceptance.   Dental advisory given  Plan Discussed with: CRNA  Anesthesia Plan Comments:         Anesthesia Quick Evaluation

## 2014-02-18 NOTE — Discharge Instructions (Signed)
°  Post Anesthesia Home Care Instructions ° °Activity: °Get plenty of rest for the remainder of the day. A responsible adult should stay with you for 24 hours following the procedure.  °For the next 24 hours, DO NOT: °-Drive a car °-Operate machinery °-Drink alcoholic beverages °-Take any medication unless instructed by your physician °-Make any legal decisions or sign important papers. ° °Meals: °Start with liquid foods such as gelatin or soup. Progress to regular foods as tolerated. Avoid greasy, spicy, heavy foods. If nausea and/or vomiting occur, drink only clear liquids until the nausea and/or vomiting subsides. Call your physician if vomiting continues. ° °Special Instructions/Symptoms: °Your throat may feel dry or sore from the anesthesia or the breathing tube placed in your throat during surgery. If this causes discomfort, gargle with warm salt water. The discomfort should disappear within 24 hours. °CYSTOSCOPY HOME CARE INSTRUCTIONS ° °Activity: °Rest for the remainder of the day.  Do not drive or operate equipment today.  You may resume normal activities in one to two days as instructed by your physician.  ° °Meals: °Drink plenty of liquids and eat light foods such as gelatin or soup this evening.  You may return to a normal meal plan tomorrow. ° °Return to Work: °You may return to work in one to two days or as instructed by your physician. ° °Special Instructions / Symptoms: °Call your physician if any of these symptoms occur: ° ° -persistent or heavy bleeding ° -bleeding which continues after first few urination ° -large blood clots that are difficult to pass ° -urine stream diminishes or stops completely ° -fever equal to or higher than 101 degrees Farenheit. ° -cloudy urine with a strong, foul odor ° -severe pain ° °Females should always wipe from front to back after elimination.  You may feel some burning pain when you urinate.  This should disappear with time.  Applying moist heat to the lower abdomen  or a hot tub bath may help relieve the pain. \ ° °Follow-Up / Date of Return Visit to Your Physician:  *** °Call for an appointment to arrange follow-up. ° °Patient Signature:  ________________________________________________________ ° °Nurse's Signature:  ________________________________________________________ ° °

## 2014-02-18 NOTE — H&P (Signed)
History of Present Illness Jennifer Huston FoleyBrady returns today complaining of moderate to severe right flank pain for the past 2 days. The pain is associated with nausea. 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 She has been managed conservatively with intermittent stent placement with resolution of the hydronephrosis and flank pain. The last stent placement was in September 2011. She had also considered ureteral reimplantation but prefers to have JJ stent as needed. Ultrasound right kidney today shows mild to moderate hydronephrosis. Urinalysis shows 21-50 WBC's, 7-10 RBC's, moderate bacteria.   Past Medical History Problems  1. History of Arthritis (V13.4) 2. History of hypercholesterolemia (V12.29) 3. History of hypertension (V12.59)  Surgical History Problems  1. History of Cystoscopy With Insertion Of Ureteral Stent Right 2. History of Cystoscopy With Insertion Of Ureteral Stent Right 3. History of Cystoscopy With Insertion Of Ureteral Stent Right 4. History of Cystourethroscopy W/ Ureteroscopy W/ Tx Of Ureteral Strict 5. History of Cystourethroscopy W/ Ureteroscopy W/ Tx Of Ureteral Strict 6. History of Cystourethroscopy W/ Ureteroscopy W/ Tx Of Ureteral Strict 7. History of Hysterectomy 8. History of Knee Surgery 9. History of Knee Surgery 10. History of Ureter Procedures  Current Meds 1. ALPRAZolam 0.5 MG Oral Tablet;  Therapy: 01Nov2010 to Recorded 2. Crestor 10 MG Oral Tablet;  Therapy: (Recorded:08May2009) to Recorded 3. Multi-Vitamin TABS;  Therapy: (Recorded:08May2009) to Recorded 4. NexIUM 40 MG Oral Capsule Delayed Release (Esomeprazole Magnesium);  Therapy: (Recorded:19Jul2011) to Recorded 5. Triamterene-HCTZ 37.5-25 MG Oral Capsule;  Therapy: (Recorded:08May2009) to Recorded  Allergies Medication  1. No Known Drug Allergies  Family History Problems  1. Family history of  Arthritis (V17.7) 2. Family history of Family Health Status - Mother's Age : Mother   5376 3. Family history of Family Health Status Number Of Children : Mother   3 daughters 4. Family history of Father Deceased At Age ____ : Mother   5072 5. Family history of Hypertension (V17.49) : Mother  Social History Problems  1. Denied: History of Alcohol Use 2. Caffeine Use   2 a day 3. Marital History - Currently Married 4. Never A Smoker 5. Denied: History of Tobacco Use  Review of Systems Genitourinary, constitutional, skin, eye, otolaryngeal, hematologic/lymphatic, cardiovascular, pulmonary, endocrine, musculoskeletal, gastrointestinal, neurological and psychiatric system(s) were reviewed and pertinent findings if present are noted.  Gastrointestinal: flank pain.    Vitals Vital Signs [Data Includes: Last 1 Day]  Recorded: 28May2015 09:56AM  Blood Pressure: 93 / 62 Temperature: 98.8 F Heart Rate: 64 Respiration: 18  Physical Exam Constitutional: Well nourished and well developed . No acute distress.  ENT:. The ears and nose are normal in appearance.  Neck: The appearance of the neck is normal and no neck mass is present.  Pulmonary: No respiratory distress and normal respiratory rhythm and effort.  Cardiovascular: Heart rate and rhythm are normal . No peripheral edema.  Abdomen: The abdomen is soft and nontender. No masses are palpated. Moderate. moderate right CVA tenderness no CVA tenderness. No hernias are palpable. No hepatosplenomegaly noted.  Genitourinary:  The bladder is non tender and not distended.  Lymphatics: The femoral and inguinal nodes are not enlarged or tender.  Skin: Normal skin turgor, no visible rash and no visible skin lesions.  Neuro/Psych:. Mood and affect are appropriate.    Results/Data Urine [Data Includes: Last 1 Day]   28May2015  COLOR YELLOW   APPEARANCE CLOUDY   SPECIFIC GRAVITY 1.020   pH 6.0  GLUCOSE NEG mg/dL  BILIRUBIN NEG   KETONE  NEG mg/dL  BLOOD LARGE   PROTEIN 100 mg/dL  UROBILINOGEN 0.2 mg/dL  NITRITE NEG   LEUKOCYTE ESTERASE LARGE   SQUAMOUS EPITHELIAL/HPF MODERATE   WBC 21-50 WBC/hpf  RBC 7-10 RBC/hpf  BACTERIA MODERATE   CRYSTALS NONE SEEN   CASTS NONE SEEN     ULTRASOUND RIGHT KIDNEY  INDICATION: H/o ureteral stricture.  The kidney measures 11.8 x 3.2 x 4.4 cm. There is mild to moderate right hydronephrosis. The ureter appears to be moderately dilated.  PVR is 4 ml.  IMPRESSION: Mild to moderate hydronephrosis.1    1 Amended By: Su Grand; Feb 17 2014 11:15 AM EST  Assessment Assessed  1. Ureteral stricture (593.3) 2. Hydronephrosis (591) 3. Urinary tract infection (599.0)  Plan Health Maintenance  1. UA With REFLEX; [Do Not Release]; Status:Resulted - Requires Verification;   Done:  28May2015 08:47AM Hydronephrosis  2. RENAL U/S RIGHT; Status:Resulted - Requires Verification;   Done: 28May2015 12:00AM Urinary tract infection  3. URINE CULTURE; Status:In Progress - Specimen/Data Collected;   Done: 28May2015  Urine culture. Cephalexin 500 mgm 4 times a day. Right JJ stent placement. The procedure, risks, benefits were discussed again with the patient. The risks include but are not limited to hemorrhage, infection, inability to catheterize the ureter. She understands and wishes to proceed.

## 2014-02-18 NOTE — Anesthesia Procedure Notes (Signed)
Procedure Name: LMA Insertion Date/Time: 02/18/2014 9:22 AM Performed by: Norva Pavlov Pre-anesthesia Checklist: Patient identified, Emergency Drugs available, Suction available and Patient being monitored Patient Re-evaluated:Patient Re-evaluated prior to inductionOxygen Delivery Method: Circle System Utilized Preoxygenation: Pre-oxygenation with 100% oxygen Intubation Type: IV induction Ventilation: Mask ventilation without difficulty LMA: LMA with gastric port inserted LMA Size: 4.0 Number of attempts: 1 Placement Confirmation: positive ETCO2 Tube secured with: Tape Dental Injury: Teeth and Oropharynx as per pre-operative assessment

## 2014-02-18 NOTE — Op Note (Signed)
Jennifer Sanford is a 63 y.o.   02/18/2014  General  Preop diagnosis: Right hydronephrosis, right ureteral anastomotic stricture.  Postop diagnosis: Same  Procedure done: Cystoscopy right retrograde pyelogram, dilation of urethral stricture, insertion of double-J stent  Surgeon: Wendie Simmer. Pinchas Reither  Anesthesia: General  Indication: Patient is a 63 years old female who presented to the office yesterday complaining of severe right flank pain for the past 2 days. The pain is associated with nausea. 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 She has been managed conservatively with intermittent stent placement. The last stent was placed in September 2011. Renal ultrasound showed mild to moderate hydronephrosis. She is scheduled today for cystoscopy ureteral dilation and insertion of double-J stent  Procedure: Patient was identified by her wrist band and proper timeout was taken.  Under general anesthesia she was prepped and draped and placed in the dorsolithotomy position. A panendoscope was inserted in the bladder. The bladder mucosa is normal there is no stone or tumor in the bladder. The left ureteral orifice is in its normal position. There is a pinpoint right ureteral orifice at the right dome of the bladder.  Right retrograde pyelogram.  A Glidewire was passed up to a #6 [place] open-ended catheter. With the open-ended catheter at the right ureteral orifice the Glidewire was advanced through the ureter and the open-ended catheter was advanced over the Glidewire into the mid ureter. The Glidewire was removed. Contrast was then injected through the open-ended catheter. The proximal ureter is moderately dilated. The renal pelvis and calyces also moderately dilated. There is no evidence of filling defect in the ureter nor in the renal pelvis. The open-ended catheter was then pulled back into the distal ureter and contrast was  injected in the ureter appears moderately dilated. A sensor wire was passed through the open-ended catheter and the open-ended catheter was removed.  The inner sheath of a ureteroscope access sheath was then passed over the sensor wire. The neo-right ureteral orifice was then dilated with the ureteroscope access sheath. The ureteroscope access sheath was removed. A #8 French-[id number] Polaris stent was then passed over the sensor wire. The proximal curl of the double-J stent is in the renal pelvis and the loops of the Polaris stent are in the bladder.  The bladder was then emptied and the cystoscope and sensor wire were removed.  The patient tolerated the procedure well and left the OR in satisfactory condition to postanesthesia care unit.

## 2014-02-18 NOTE — Anesthesia Postprocedure Evaluation (Signed)
Anesthesia Post Note  Patient: Jennifer Sanford  Procedure(s) Performed: Procedure(s) (LRB): CYSTOSCOPY WITH right STENT PLACEMENT (Right)  Anesthesia type: General  Patient location: PACU  Post pain: Pain level controlled  Post assessment: Post-op Vital signs reviewed  Last Vitals: BP 104/69  Pulse 53  Temp(Src) 36.6 C (Oral)  Resp 13  Ht 5\' 11"  (1.803 m)  Wt 211 lb (95.709 kg)  BMI 29.44 kg/m2  SpO2 100%  Post vital signs: Reviewed  Level of consciousness: sedated  Complications: No apparent anesthesia complications

## 2014-02-19 LAB — URINE CULTURE
Colony Count: NO GROWTH
Culture: NO GROWTH

## 2014-02-21 ENCOUNTER — Encounter (HOSPITAL_BASED_OUTPATIENT_CLINIC_OR_DEPARTMENT_OTHER): Payer: Self-pay | Admitting: Urology

## 2014-08-16 ENCOUNTER — Other Ambulatory Visit (HOSPITAL_COMMUNITY): Payer: Self-pay | Admitting: Urology

## 2014-08-16 ENCOUNTER — Other Ambulatory Visit: Payer: Self-pay | Admitting: Urology

## 2014-08-16 DIAGNOSIS — N135 Crossing vessel and stricture of ureter without hydronephrosis: Secondary | ICD-10-CM

## 2014-08-22 ENCOUNTER — Ambulatory Visit (HOSPITAL_COMMUNITY)
Admission: RE | Admit: 2014-08-22 | Discharge: 2014-08-22 | Disposition: A | Discharge status: Home / Self Care | Payer: 59 | Source: Ambulatory Visit | Attending: Urology | Admitting: Urology

## 2014-08-22 DIAGNOSIS — N135 Crossing vessel and stricture of ureter without hydronephrosis: Secondary | ICD-10-CM | POA: Diagnosis present

## 2014-08-22 DIAGNOSIS — N2889 Other specified disorders of kidney and ureter: Secondary | ICD-10-CM | POA: Diagnosis not present

## 2014-08-22 MED ORDER — FUROSEMIDE 10 MG/ML IJ SOLN
44.0000 mg | Freq: Once | INTRAMUSCULAR | Status: AC
  Administered 2014-08-22: 44 mg via INTRAVENOUS
  Filled 2014-08-22: qty 6

## 2014-08-22 MED ORDER — TECHNETIUM TC 99M MERTIATIDE
15.2000 | Freq: Once | INTRAVENOUS | Status: AC | PRN
  Administered 2014-08-22: 15 via INTRAVENOUS

## 2014-09-09 ENCOUNTER — Encounter (HOSPITAL_COMMUNITY)
Admission: RE | Admit: 2014-09-09 | Discharge: 2014-09-09 | Disposition: A | Discharge status: Home / Self Care | Payer: 59 | Source: Ambulatory Visit | Attending: Urology | Admitting: Urology

## 2014-09-09 ENCOUNTER — Other Ambulatory Visit: Payer: Self-pay

## 2014-09-09 ENCOUNTER — Ambulatory Visit (HOSPITAL_COMMUNITY)
Admission: RE | Admit: 2014-09-09 | Discharge: 2014-09-09 | Disposition: A | Discharge status: Home / Self Care | Payer: 59 | Source: Ambulatory Visit | Attending: Urology | Admitting: Urology

## 2014-09-09 ENCOUNTER — Encounter (HOSPITAL_COMMUNITY): Payer: Self-pay

## 2014-09-09 DIAGNOSIS — I10 Essential (primary) hypertension: Secondary | ICD-10-CM

## 2014-09-09 DIAGNOSIS — Z01818 Encounter for other preprocedural examination: Secondary | ICD-10-CM | POA: Insufficient documentation

## 2014-09-09 LAB — CBC
HCT: 37.6 % (ref 36.0–46.0)
Hemoglobin: 12.2 g/dL (ref 12.0–15.0)
MCH: 29.3 pg (ref 26.0–34.0)
MCHC: 32.4 g/dL (ref 30.0–36.0)
MCV: 90.4 fL (ref 78.0–100.0)
Platelets: 315 10*3/uL (ref 150–400)
RBC: 4.16 MIL/uL (ref 3.87–5.11)
RDW: 13.7 % (ref 11.5–15.5)
WBC: 7 10*3/uL (ref 4.0–10.5)

## 2014-09-09 LAB — BASIC METABOLIC PANEL
Anion gap: 10 (ref 5–15)
BUN: 24 mg/dL — ABNORMAL HIGH (ref 6–23)
CO2: 29 mEq/L (ref 19–32)
Calcium: 10.1 mg/dL (ref 8.4–10.5)
Chloride: 102 mEq/L (ref 96–112)
Creatinine, Ser: 1.06 mg/dL (ref 0.50–1.10)
GFR calc Af Amer: 64 mL/min — ABNORMAL LOW (ref >90)
GFR calc non Af Amer: 55 mL/min — ABNORMAL LOW (ref >90)
Glucose, Bld: 85 mg/dL (ref 70–99)
Potassium: 4.3 mEq/L (ref 3.7–5.3)
Sodium: 141 mEq/L (ref 137–147)

## 2014-09-09 NOTE — Patient Instructions (Addendum)
Jennifer Sanford  09/09/2014   Your procedure is scheduled on: 09/13/14    Comwe thru the main Entrance  and follow signs to               Short Stay Center at    0530 AM.  Call this number if you have problems the morning of surgery [phone]   Remember:  Do not eat food or drink liquids :After Midnight.     Take these medicines the morning of surgery with A SIP OF WATER: Protonix                                You may not have any metal on your body including hair pins and              piercings  Do not wear jewelry, make-up, lotions, powders or perfumes.             Do not wear nail polish.  Do not shave  48 hours prior to surgery.                 Do not bring valuables to the hospital. [facility] IS NOT             RESPONSIBLE   FOR VALUABLES.  Contacts, dentures or bridgework may not be worn into surgery.  Leave suitcase in the car. After surgery it may be brought to your room.       Special Instructions: N/A              Please read over the following fact sheets you were given: _____________________________________________________________________             [facility] - Preparing for [facility] Before surgery, you can play an important role.  Because skin is not sterile, your skin needs to be as free of germs as possible.  You can reduce the number of germs on your skin by washing with CHG (chlorahexidine gluconate) soap before surgery.  CHG is an antiseptic cleaner which kills germs and bonds with the skin to continue killing germs even after washing. Please DO NOT use if you have an allergy to CHG or antibacterial soaps.  If your skin becomes reddened/irritated stop using the CHG and inform your nurse when you arrive at Short Stay. Do not shave (including legs and underarms) for at least 48 hours prior to the first CHG shower.  You may shave your face/neck. Please follow these instructions carefully:  1.  Shower with CHG Soap the night before surgery and  the  morning of Surgery.  2.  If you choose to wash your hair, wash your hair first as usual with your  normal  shampoo.  3.  After you shampoo, rinse your hair and body thoroughly to remove the  shampoo.                           4.  Use CHG as you would any other liquid soap.  You can apply chg directly  to the skin and wash                       Gently with a scrungie or clean washcloth.  5.  Apply the CHG Soap to your body ONLY FROM THE NECK DOWN.  Do not use on face/ open                           Wound or open sores. Avoid contact with eyes, ears mouth and genitals (private parts).                       Wash face,  Genitals (private parts) with your normal soap.             6.  Wash thoroughly, paying special attention to the area where your surgery  will be performed.  7.  Thoroughly rinse your body with warm water from the neck down.  8.  DO NOT shower/wash with your normal soap after using and rinsing off  the CHG Soap.                9.  Pat yourself dry with a clean towel.            10.  Wear clean pajamas.            11.  Place clean sheets on your bed the night of your first shower and do not  sleep with pets. Day of Surgery : Do not apply any lotions/deodorants the morning of surgery.  Please wear clean clothes to the hospital/surgery center.  FAILURE TO FOLLOW THESE INSTRUCTIONS MAY RESULT IN THE CANCELLATION OF YOUR SURGERY PATIENT SIGNATURE_________________________________  NURSE SIGNATURE__________________________________  ________________________________________________________________________  WHAT IS A BLOOD TRANSFUSION? Blood Transfusion Information  A transfusion is the replacement of blood or some of its parts. Blood is made up of multiple cells which provide different functions.  Red blood cells carry oxygen and are used for blood loss replacement.  White blood cells fight against infection.  Platelets control bleeding.  Plasma helps clot blood.  Other  blood products are available for specialized needs, such as hemophilia or other clotting disorders. BEFORE THE TRANSFUSION  Who gives blood for transfusions?   Healthy volunteers who are fully evaluated to make sure their blood is safe. This is blood bank blood. Transfusion therapy is the safest it has ever been in the practice of medicine. Before blood is taken from a donor, a complete history is taken to make sure that person has no history of diseases nor engages in risky social behavior (examples are intravenous drug use or sexual activity with multiple partners). The donor's travel history is screened to minimize risk of transmitting infections, such as malaria. The donated blood is tested for signs of infectious diseases, such as HIV and hepatitis. The blood is then tested to be sure it is compatible with you in order to minimize the chance of a transfusion reaction. If you or a relative donates blood, this is often done in anticipation of surgery and is not appropriate for emergency situations. It takes many days to process the donated blood. RISKS AND COMPLICATIONS Although transfusion therapy is very safe and saves many lives, the main dangers of transfusion include:   Getting an infectious disease.  Developing a transfusion reaction. This is an allergic reaction to something in the blood you were given. Every precaution is taken to prevent this. The decision to have a blood transfusion has been considered carefully by your caregiver before blood is given. Blood is not given unless the benefits outweigh the risks. AFTER THE TRANSFUSION  Right after receiving a blood transfusion, you will usually feel much better and more energetic. This is especially  true if your red blood cells have gotten low (anemic). The transfusion raises the level of the red blood cells which carry oxygen, and this usually causes an energy increase.  The nurse administering the transfusion will monitor you carefully  for complications. HOME CARE INSTRUCTIONS  No special instructions are needed after a transfusion. You may find your energy is better. Speak with your caregiver about any limitations on activity for underlying diseases you may have. SEEK MEDICAL CARE IF:   Your condition is not improving after your transfusion.  You develop redness or irritation at the intravenous (IV) site. SEEK IMMEDIATE MEDICAL CARE IF:  Any of the following symptoms occur over the next 12 hours:  Shaking chills.  You have a temperature by mouth above 102 F (38.9 C), not controlled by medicine.  Chest, back, or muscle pain.  People around you feel you are not acting correctly or are confused.  Shortness of breath or difficulty breathing.  Dizziness and fainting.  You get a rash or develop hives.  You have a decrease in urine output.  Your urine turns a dark color or changes to pink, red, or brown. Any of the following symptoms occur over the next 10 days:  You have a temperature by mouth above 102 F (38.9 C), not controlled by medicine.  Shortness of breath.  Weakness after normal activity.  The white part of the eye turns yellow (jaundice).  You have a decrease in the amount of urine or are urinating less often.  Your urine turns a dark color or changes to pink, red, or brown. Document Released: 09/06/2000 Document Revised: 12/02/2011 Document Reviewed: 04/25/2008 [facility] Patient Information 2014 [place], [place].  _______________________________________________________________________

## 2014-09-09 NOTE — Progress Notes (Signed)
BMP result done 09/09/2014 faxed via EPIC to Dr Marlou PorchHerrick.

## 2014-09-10 LAB — URINE CULTURE: Colony Count: 5000

## 2014-09-12 NOTE — H&P (Signed)
History of Present Illness The office visit today is mainly for discussion regarding the risks, benefits of planned surgery. Renal scan shows 50.1% function right kidney and 49.9% function left with partial obstruction right kidney. She has not had any pain. Her history is outlined in the note below.      Jennifer Sanford returns today for follow-up. 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 She had a right ureteral reimplantation in November 2001 in [place] for a ureteral injury sustained during laparoscopic assisted vaginal hysterectomy. She had a JJ stent reinserted few weeks later for right hydronephrosis. She then relocated to [place]. 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 It has been managed with JJ stent on and off since 2010. The last stent was inserted on 5/29. She has been having right flank discomfort on and off and gross hematuria since last week. She states that she is now ready for definitive management of the ureteral stricture. I asked Dr Marlou PorchHerrick to see her in consultation for definitive surgery. She understands the risks of the surgery. They include but are not limited to hemorrhage, infection, injury to adjacent organs,recurrence of the stricture, possible right nephrectomy.    Since the stent has been indwelling for about 6 months we will remove it. Will get Lasix renal scan for differential function of the kidneys.   Past Medical History Problems  1. History of Arthritis 2. History of hypercholesterolemia (Z86.39) 3. History of hypertension (Z86.79)  Surgical History Problems  1. History of Cystoscopy For Urethral Stricture 2. History of Cystoscopy With Insertion Of Ureteral Stent Right 3. History of Cystoscopy With Insertion Of Ureteral Stent Right 4. History of Cystoscopy With Insertion Of Ureteral Stent Right 5. History of Cystoscopy With Insertion Of Ureteral Stent Right 6. History of Cystourethroscopy W/ Ureteroscopy W/ Tx Of Ureteral Strict 7. History  of Cystourethroscopy W/ Ureteroscopy W/ Tx Of Ureteral Strict 8. History of Cystourethroscopy W/ Ureteroscopy W/ Tx Of Ureteral Strict 9. History of Cystourethroscopy With Treatment Of Ureteral Stricture 10. History of Hysterectomy 11. History of Knee Surgery 12. History of Knee Surgery 13. History of Ureter Procedures  Current Meds 1. ALPRAZolam 0.5 MG Oral Tablet;  Therapy: 01Nov2010 to Recorded 2. Cephalexin 500 MG Oral Capsule; TAKE 1 CAPSULE Every 6 hours for 7 days;  Therapy: 28May2015 to (Last Rx:28May2015)  Requested for: 28May2015 Ordered 3. Crestor 10 MG Oral Tablet;  Therapy: (Recorded:08May2009) to Recorded 4. Multi-Vitamin TABS;  Therapy: (Recorded:08May2009) to Recorded 5. NexIUM 40 MG Oral Capsule Delayed Release;  Therapy: (Recorded:19Jul2011) to Recorded 6. Triamterene-HCTZ 37.5-25 MG Oral Capsule;  Therapy: (Recorded:08May2009) to Recorded  Allergies Medication  1. No Known Drug Allergies  Family History Problems  1. Family history of Arthritis 2. Family history of Family Health Status - Mother's Age : Mother   2576 3. Family history of Family Health Status Number Of Children : Mother   3 daughters 4. Family history of Father Deceased At Age ____ : Mother   3272 5. Family history of Hypertension : Mother  Social History Problems  1. Denied: History of Alcohol Use 2. Caffeine Use   2 a day 3. Marital History - Currently Married 4. Never A Smoker 5. Denied: History of Tobacco Use  Review of Systems Genitourinary, constitutional, skin, eye, otolaryngeal, hematologic/lymphatic, cardiovascular, pulmonary, endocrine, musculoskeletal, gastrointestinal, neurological and psychiatric system(s) were reviewed and pertinent findings if present are noted and are otherwise negative.    Vitals Vital Signs [Data Includes: Last 1 Day]  Recorded:  15Dec2015 04:37PM  Blood Pressure: 107 / 71 Temperature: 97.6 F Heart Rate: 71 Respiration: 18  Physical  Exam Constitutional: Well nourished and well developed . No acute distress.    Assessment Assessed  1. 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 Hydronephrosis (N13.30)  Plan Right ureteral reimplantation. The risks of the procedure include but are not limited to hemorrhage, infection, ureteral injury, injury to adjacent organs, psoas itch, Boari flap, bowel interposition. She understands and wishes to proceed. She does not want to continue to have stent exchanges every 3-6 months.

## 2014-09-13 ENCOUNTER — Inpatient Hospital Stay (HOSPITAL_COMMUNITY)
Admission: RE | Admit: 2014-09-13 | Discharge: 2014-09-16 | DRG: 660 | Disposition: A | Discharge status: Home / Self Care | Payer: 59 | Source: Ambulatory Visit | Attending: Urology | Admitting: Urology

## 2014-09-13 ENCOUNTER — Inpatient Hospital Stay (HOSPITAL_COMMUNITY): Payer: 59 | Admitting: Anesthesiology

## 2014-09-13 ENCOUNTER — Encounter (HOSPITAL_COMMUNITY)
Admission: RE | Disposition: A | Discharge status: Home / Self Care | Payer: Self-pay | Source: Ambulatory Visit | Attending: Urology

## 2014-09-13 ENCOUNTER — Encounter (HOSPITAL_COMMUNITY): Payer: Self-pay | Admitting: *Deleted

## 2014-09-13 DIAGNOSIS — E785 Hyperlipidemia, unspecified: Secondary | ICD-10-CM | POA: Diagnosis present

## 2014-09-13 DIAGNOSIS — I10 Essential (primary) hypertension: Secondary | ICD-10-CM | POA: Diagnosis present

## 2014-09-13 DIAGNOSIS — K219 Gastro-esophageal reflux disease without esophagitis: Secondary | ICD-10-CM | POA: Diagnosis present

## 2014-09-13 DIAGNOSIS — Z90710 Acquired absence of both cervix and uterus: Secondary | ICD-10-CM

## 2014-09-13 DIAGNOSIS — Z9889 Other specified postprocedural states: Secondary | ICD-10-CM

## 2014-09-13 DIAGNOSIS — R310 Gross hematuria: Secondary | ICD-10-CM | POA: Diagnosis present

## 2014-09-13 DIAGNOSIS — Z96653 Presence of artificial knee joint, bilateral: Secondary | ICD-10-CM | POA: Diagnosis present

## 2014-09-13 DIAGNOSIS — K660 Peritoneal adhesions (postprocedural) (postinfection): Secondary | ICD-10-CM | POA: Diagnosis present

## 2014-09-13 DIAGNOSIS — N131 Hydronephrosis with ureteral stricture, not elsewhere classified: Secondary | ICD-10-CM | POA: Diagnosis present

## 2014-09-13 HISTORY — PX: URETERAL REIMPLANTION: SHX2611

## 2014-09-13 LAB — CBC
HCT: 33.3 % — ABNORMAL LOW (ref 36.0–46.0)
Hemoglobin: 11 g/dL — ABNORMAL LOW (ref 12.0–15.0)
MCH: 29.3 pg (ref 26.0–34.0)
MCHC: 33 g/dL (ref 30.0–36.0)
MCV: 88.6 fL (ref 78.0–100.0)
Platelets: 244 10*3/uL (ref 150–400)
RBC: 3.76 MIL/uL — ABNORMAL LOW (ref 3.87–5.11)
RDW: 13.8 % (ref 11.5–15.5)
WBC: 13.1 10*3/uL — ABNORMAL HIGH (ref 4.0–10.5)

## 2014-09-13 LAB — BASIC METABOLIC PANEL
Anion gap: 5 (ref 5–15)
BUN: 17 mg/dL (ref 6–23)
CO2: 29 mmol/L (ref 19–32)
Calcium: 8.6 mg/dL (ref 8.4–10.5)
Chloride: 103 mEq/L (ref 96–112)
Creatinine, Ser: 1.18 mg/dL — ABNORMAL HIGH (ref 0.50–1.10)
GFR calc Af Amer: 56 mL/min — ABNORMAL LOW (ref >90)
GFR calc non Af Amer: 49 mL/min — ABNORMAL LOW (ref >90)
Glucose, Bld: 128 mg/dL — ABNORMAL HIGH (ref 70–99)
Potassium: 4.4 mmol/L (ref 3.5–5.1)
Sodium: 137 mmol/L (ref 135–145)

## 2014-09-13 LAB — TYPE AND SCREEN
ABO/RH(D): O POS
Antibody Screen: NEGATIVE

## 2014-09-13 SURGERY — REIMPLANTATION, URETER
Anesthesia: General | Laterality: Right

## 2014-09-13 MED ORDER — MIDAZOLAM HCL 5 MG/5ML IJ SOLN
INTRAMUSCULAR | Status: DC | PRN
  Administered 2014-09-13: 2 mg via INTRAVENOUS

## 2014-09-13 MED ORDER — LIDOCAINE-EPINEPHRINE (PF) 2 %-1:200000 IJ SOLN
INTRAMUSCULAR | Status: AC
  Filled 2014-09-13: qty 20

## 2014-09-13 MED ORDER — PHENYLEPHRINE HCL 10 MG/ML IJ SOLN
INTRAMUSCULAR | Status: AC
  Filled 2014-09-13: qty 1

## 2014-09-13 MED ORDER — LIDOCAINE HCL (CARDIAC) 20 MG/ML IV SOLN
INTRAVENOUS | Status: DC | PRN
  Administered 2014-09-13: 100 mg via INTRAVENOUS

## 2014-09-13 MED ORDER — ROCURONIUM BROMIDE 100 MG/10ML IV SOLN
INTRAVENOUS | Status: AC
  Filled 2014-09-13: qty 1

## 2014-09-13 MED ORDER — PANTOPRAZOLE SODIUM 40 MG PO TBEC
40.0000 mg | DELAYED_RELEASE_TABLET | Freq: Every morning | ORAL | Status: DC
  Administered 2014-09-14 – 2014-09-16 (×3): 40 mg via ORAL
  Filled 2014-09-13 (×3): qty 1

## 2014-09-13 MED ORDER — SODIUM CHLORIDE 0.9 % IJ SOLN
INTRAMUSCULAR | Status: AC
  Filled 2014-09-13: qty 10

## 2014-09-13 MED ORDER — SODIUM CHLORIDE 0.9 % IR SOLN
Status: DC | PRN
  Administered 2014-09-13: 3000 mL

## 2014-09-13 MED ORDER — PROPOFOL 10 MG/ML IV BOLUS
INTRAVENOUS | Status: AC
  Filled 2014-09-13: qty 20

## 2014-09-13 MED ORDER — DEXTROSE IN LACTATED RINGERS 5 % IV SOLN
INTRAVENOUS | Status: DC
  Administered 2014-09-13 – 2014-09-14 (×2): via INTRAVENOUS

## 2014-09-13 MED ORDER — NEOSTIGMINE METHYLSULFATE 10 MG/10ML IV SOLN
INTRAVENOUS | Status: AC
  Filled 2014-09-13: qty 1

## 2014-09-13 MED ORDER — CIPROFLOXACIN IN D5W 400 MG/200ML IV SOLN
400.0000 mg | INTRAVENOUS | Status: AC
  Administered 2014-09-13: 400 mg via INTRAVENOUS

## 2014-09-13 MED ORDER — LACTATED RINGERS IV BOLUS (SEPSIS)
1000.0000 mL | Freq: Once | INTRAVENOUS | Status: AC
  Administered 2014-09-13: 1000 mL via INTRAVENOUS

## 2014-09-13 MED ORDER — ONDANSETRON HCL 4 MG/2ML IJ SOLN
INTRAMUSCULAR | Status: AC
Start: 2014-09-13 — End: 2014-09-13
  Filled 2014-09-13: qty 2

## 2014-09-13 MED ORDER — PHENYLEPHRINE HCL 10 MG/ML IJ SOLN
INTRAMUSCULAR | Status: DC | PRN
  Administered 2014-09-13: 40 ug via INTRAVENOUS
  Administered 2014-09-13 (×2): 80 ug via INTRAVENOUS

## 2014-09-13 MED ORDER — ROPIVACAINE HCL 2 MG/ML IJ SOLN
14.0000 mL/h | INTRAMUSCULAR | Status: DC
  Administered 2014-09-14 – 2014-09-15 (×3): 14 mL/h via EPIDURAL
  Filled 2014-09-13 (×9): qty 200

## 2014-09-13 MED ORDER — PROPOFOL 10 MG/ML IV BOLUS
INTRAVENOUS | Status: DC | PRN
  Administered 2014-09-13: 10 mg via INTRAVENOUS
  Administered 2014-09-13: 150 mg via INTRAVENOUS

## 2014-09-13 MED ORDER — SUFENTANIL CITRATE 50 MCG/ML IV SOLN
INTRAVENOUS | Status: AC
Start: 2014-09-13 — End: 2014-09-13
  Filled 2014-09-13: qty 1

## 2014-09-13 MED ORDER — SODIUM CHLORIDE 0.9 % IV SOLN
10.0000 mg | INTRAVENOUS | Status: DC | PRN
  Administered 2014-09-13: 10 ug/min via INTRAVENOUS

## 2014-09-13 MED ORDER — LIDOCAINE HCL (CARDIAC) 20 MG/ML IV SOLN
INTRAVENOUS | Status: AC
  Filled 2014-09-13: qty 5

## 2014-09-13 MED ORDER — KETOROLAC TROMETHAMINE 30 MG/ML IJ SOLN
15.0000 mg | Freq: Four times a day (QID) | INTRAMUSCULAR | Status: AC | PRN
  Administered 2014-09-14 (×3): 15 mg via INTRAVENOUS
  Filled 2014-09-13 (×3): qty 1

## 2014-09-13 MED ORDER — BISACODYL 10 MG RE SUPP: 10.0000 mg | Freq: Every day | RECTAL | Status: DC | PRN

## 2014-09-13 MED ORDER — GLYCOPYRROLATE 0.2 MG/ML IJ SOLN
INTRAMUSCULAR | Status: AC
  Filled 2014-09-13: qty 3

## 2014-09-13 MED ORDER — ONDANSETRON HCL 4 MG/2ML IJ SOLN
INTRAMUSCULAR | Status: AC
  Filled 2014-09-13: qty 2

## 2014-09-13 MED ORDER — ACETAMINOPHEN 10 MG/ML IV SOLN
1000.0000 mg | Freq: Four times a day (QID) | INTRAVENOUS | Status: AC
  Administered 2014-09-13 – 2014-09-14 (×4): 1000 mg via INTRAVENOUS
  Filled 2014-09-13 (×5): qty 100

## 2014-09-13 MED ORDER — GLYCOPYRROLATE 0.2 MG/ML IJ SOLN
INTRAMUSCULAR | Status: DC | PRN
Start: 2014-09-13 — End: 2014-09-13
  Administered 2014-09-13: 0.6 mg via INTRAVENOUS

## 2014-09-13 MED ORDER — ONDANSETRON HCL 4 MG/2ML IJ SOLN
4.0000 mg | Freq: Four times a day (QID) | INTRAMUSCULAR | Status: DC | PRN
  Administered 2014-09-13: 4 mg via INTRAVENOUS
  Filled 2014-09-13 (×2): qty 2

## 2014-09-13 MED ORDER — PROMETHAZINE HCL 25 MG/ML IJ SOLN: 6.2500 mg | INTRAMUSCULAR | Status: DC | PRN

## 2014-09-13 MED ORDER — CEFAZOLIN SODIUM-DEXTROSE 2-3 GM-% IV SOLR
INTRAVENOUS | Status: AC
  Filled 2014-09-13: qty 50

## 2014-09-13 MED ORDER — ROPIVACAINE HCL 5 MG/ML IJ SOLN
INTRAMUSCULAR | Status: DC | PRN
  Administered 2014-09-13: 5 mL via EPIDURAL

## 2014-09-13 MED ORDER — SUFENTANIL CITRATE 50 MCG/ML IV SOLN
INTRAVENOUS | Status: DC | PRN
  Administered 2014-09-13: 5 ug via INTRAVENOUS
  Administered 2014-09-13: 10 ug via INTRAVENOUS
  Administered 2014-09-13 (×3): 5 ug via INTRAVENOUS
  Administered 2014-09-13: 10 ug via INTRAVENOUS

## 2014-09-13 MED ORDER — LACTATED RINGERS IV SOLN
INTRAVENOUS | Status: DC
  Administered 2014-09-13: 13:00:00 via INTRAVENOUS

## 2014-09-13 MED ORDER — MIDAZOLAM HCL 2 MG/2ML IJ SOLN
INTRAMUSCULAR | Status: AC
  Filled 2014-09-13: qty 2

## 2014-09-13 MED ORDER — ROCURONIUM BROMIDE 100 MG/10ML IV SOLN
INTRAVENOUS | Status: DC | PRN
  Administered 2014-09-13: 10 mg via INTRAVENOUS
  Administered 2014-09-13: 40 mg via INTRAVENOUS
  Administered 2014-09-13: 20 mg via INTRAVENOUS

## 2014-09-13 MED ORDER — ONDANSETRON HCL 4 MG/2ML IJ SOLN
INTRAMUSCULAR | Status: DC | PRN
  Administered 2014-09-13: 4 mg via INTRAVENOUS

## 2014-09-13 MED ORDER — OXYCODONE HCL 5 MG PO TABS
5.0000 mg | ORAL_TABLET | ORAL | Status: DC | PRN
  Administered 2014-09-15: 10 mg via ORAL
  Administered 2014-09-15 (×3): 5 mg via ORAL
  Administered 2014-09-16: 10 mg via ORAL
  Filled 2014-09-13 (×2): qty 1
  Filled 2014-09-13 (×2): qty 2
  Filled 2014-09-13: qty 1

## 2014-09-13 MED ORDER — ZOLPIDEM TARTRATE 5 MG PO TABS: 5.0000 mg | ORAL_TABLET | Freq: Every evening | ORAL | Status: DC | PRN

## 2014-09-13 MED ORDER — LIDOCAINE-EPINEPHRINE (PF) 2 %-1:200000 IJ SOLN
INTRAMUSCULAR | Status: DC | PRN
  Administered 2014-09-13 (×2): 5 mL via INTRADERMAL

## 2014-09-13 MED ORDER — CIPROFLOXACIN IN D5W 400 MG/200ML IV SOLN
INTRAVENOUS | Status: AC
  Filled 2014-09-13: qty 200

## 2014-09-13 MED ORDER — HYDROMORPHONE HCL 1 MG/ML IJ SOLN: 0.2500 mg | INTRAMUSCULAR | Status: DC | PRN

## 2014-09-13 MED ORDER — LACTATED RINGERS IV SOLN
INTRAVENOUS | Status: DC | PRN
  Administered 2014-09-13: 07:00:00 via INTRAVENOUS

## 2014-09-13 MED ORDER — HYDROMORPHONE HCL 1 MG/ML IJ SOLN
0.5000 mg | INTRAMUSCULAR | Status: DC | PRN
  Administered 2014-09-13: 0.5 mg via INTRAVENOUS
  Filled 2014-09-13: qty 1

## 2014-09-13 MED ORDER — HEPARIN SODIUM (PORCINE) 5000 UNIT/ML IJ SOLN: 5000.0000 [IU] | Freq: Three times a day (TID) | INTRAMUSCULAR | Status: DC

## 2014-09-13 MED ORDER — TRIAMTERENE-HCTZ 37.5-25 MG PO TABS
0.5000 | ORAL_TABLET | Freq: Every morning | ORAL | Status: DC
  Administered 2014-09-14 – 2014-09-16 (×3): 0.5 via ORAL
  Filled 2014-09-13 (×3): qty 0.5

## 2014-09-13 MED ORDER — CEFAZOLIN SODIUM-DEXTROSE 2-3 GM-% IV SOLR
2.0000 g | INTRAVENOUS | Status: AC
  Administered 2014-09-13: 2 g via INTRAVENOUS

## 2014-09-13 MED ORDER — CITALOPRAM HYDROBROMIDE 20 MG PO TABS
20.0000 mg | ORAL_TABLET | Freq: Every morning | ORAL | Status: DC
  Administered 2014-09-14 – 2014-09-16 (×3): 20 mg via ORAL
  Filled 2014-09-13 (×4): qty 1

## 2014-09-13 MED ORDER — ROPIVACAINE HCL 2 MG/ML IJ SOLN
10.0000 mL/h | INTRAMUSCULAR | Status: DC
  Administered 2014-09-13: 10 mL/h via EPIDURAL
  Filled 2014-09-13 (×3): qty 200

## 2014-09-13 MED ORDER — DOCUSATE SODIUM 100 MG PO CAPS
100.0000 mg | ORAL_CAPSULE | Freq: Two times a day (BID) | ORAL | Status: DC
  Administered 2014-09-13 – 2014-09-16 (×6): 100 mg via ORAL
  Filled 2014-09-13 (×6): qty 1

## 2014-09-13 MED ORDER — MEPERIDINE HCL 50 MG/ML IJ SOLN: 6.2500 mg | INTRAMUSCULAR | Status: DC | PRN

## 2014-09-13 MED ORDER — NEOSTIGMINE METHYLSULFATE 10 MG/10ML IV SOLN
INTRAVENOUS | Status: DC | PRN
Start: 2014-09-13 — End: 2014-09-13
  Administered 2014-09-13: 4 mg via INTRAVENOUS

## 2014-09-13 MED ORDER — DEXTROSE 5 % IV SOLN
1.0000 g | Freq: Once | INTRAVENOUS | Status: AC
  Administered 2014-09-13: 1 g via INTRAVENOUS
  Filled 2014-09-13: qty 10

## 2014-09-13 MED ORDER — BELLADONNA ALKALOIDS-OPIUM 16.2-60 MG RE SUPP: 1.0000 | Freq: Three times a day (TID) | RECTAL | Status: DC | PRN

## 2014-09-13 MED ORDER — ONDANSETRON HCL 4 MG/2ML IJ SOLN
4.0000 mg | Freq: Once | INTRAMUSCULAR | Status: AC
  Administered 2014-09-13: 4 mg via INTRAVENOUS

## 2014-09-13 MED ORDER — ROPIVACAINE HCL 5 MG/ML IJ SOLN
INTRAMUSCULAR | Status: AC
  Filled 2014-09-13: qty 30

## 2014-09-13 SURGICAL SUPPLY — 59 items
BAG URINE DRAINAGE (UROLOGICAL SUPPLIES) ×2 IMPLANT
BLADE EXTENDED COATED 6.5IN (ELECTRODE) ×2 IMPLANT
BLADE HEX COATED 2.75 (ELECTRODE) ×2 IMPLANT
CATH FOLEY 2WAY SLVR  5CC 22FR (CATHETERS)
CATH FOLEY 2WAY SLVR 30CC 24FR (CATHETERS) ×1 IMPLANT
CATH FOLEY 2WAY SLVR 5CC 22FR (CATHETERS) ×1 IMPLANT
CHLORAPREP W/TINT 26ML (MISCELLANEOUS) ×2 IMPLANT
CLIP LIGATING HEM O LOK PURPLE (MISCELLANEOUS) ×4 IMPLANT
CLIP LIGATING HEMO O LOK GREEN (MISCELLANEOUS) ×4 IMPLANT
COVER SURGICAL LIGHT HANDLE (MISCELLANEOUS) ×2 IMPLANT
DRAIN CHANNEL 10F 3/8 F FF (DRAIN) IMPLANT
DRAIN CHANNEL 19F RND (DRAIN) ×1 IMPLANT
DRAPE LAPAROSCOPIC ABDOMINAL (DRAPES) ×1 IMPLANT
DRAPE LAPAROTOMY TRNSV 102X78 (DRAPE) ×1 IMPLANT
DRAPE WARM FLUID 44X44 (DRAPE) ×2 IMPLANT
DRSG TEGADERM 4X4.75 (GAUZE/BANDAGES/DRESSINGS) ×1 IMPLANT
DRSG TELFA 4X10 ISLAND STR (GAUZE/BANDAGES/DRESSINGS) ×1 IMPLANT
ELECT REM PT RETURN 9FT ADLT (ELECTROSURGICAL) ×2
ELECTRODE REM PT RTRN 9FT ADLT (ELECTROSURGICAL) ×1 IMPLANT
EVACUATOR SILICONE 100CC (DRAIN) ×1 IMPLANT
GAUZE SPONGE 4X4 16PLY XRAY LF (GAUZE/BANDAGES/DRESSINGS) ×1 IMPLANT
GLOVE BIOGEL M STRL SZ7.5 (GLOVE) ×2 IMPLANT
GOWN STRL REUS W/TWL LRG LVL3 (GOWN DISPOSABLE) ×6 IMPLANT
GUIDEWIRE STR DUAL SENSOR (WIRE) ×1 IMPLANT
KIT BASIN OR (CUSTOM PROCEDURE TRAY) ×2 IMPLANT
LOOP VESSEL MAXI BLUE (MISCELLANEOUS) ×1 IMPLANT
NS IRRIG 1000ML POUR BTL (IV SOLUTION) ×4 IMPLANT
PACK GENERAL/GYN (CUSTOM PROCEDURE TRAY) ×2 IMPLANT
PLUG CATH AND CAP STER (CATHETERS) ×2 IMPLANT
SET IRRIG Y TYPE TUR BLADDER L (SET/KITS/TRAYS/PACK) IMPLANT
SPONGE DRAIN TRACH 4X4 STRL 2S (GAUZE/BANDAGES/DRESSINGS) ×1 IMPLANT
SPONGE LAP 18X18 X RAY DECT (DISPOSABLE) ×2 IMPLANT
SPONGE LAP 4X18 X RAY DECT (DISPOSABLE) ×2 IMPLANT
STAPLER SKIN PROX WIDE 3.9 (STAPLE) ×1 IMPLANT
STAPLER VISISTAT 35W (STAPLE) IMPLANT
STENT CONTOUR 6FRX26X.038 (STENTS) ×1 IMPLANT
SURGILUBE 3G PEEL PACK STRL (MISCELLANEOUS) ×4 IMPLANT
SUT CHROMIC 0 UR 5 27 (SUTURE) IMPLANT
SUT CHROMIC 2 0 UR 5 27 (SUTURE) IMPLANT
SUT ETHILON 3 0 PS 1 (SUTURE) ×1 IMPLANT
SUT ETHILON 4 0 PS 2 18 (SUTURE) IMPLANT
SUT PDS AB 1 CTX 36 (SUTURE) ×4 IMPLANT
SUT PDS AB 1 TP1 96 (SUTURE) ×2 IMPLANT
SUT SILK 2 0 SH CR/8 (SUTURE) ×1 IMPLANT
SUT VIC AB 1 CTX 36 (SUTURE)
SUT VIC AB 1 CTX36XBRD ANBCTR (SUTURE) IMPLANT
SUT VIC AB 2-0 CT2 27 (SUTURE) ×2 IMPLANT
SUT VIC AB 2-0 SH 27 (SUTURE) ×2
SUT VIC AB 2-0 SH 27X BRD (SUTURE) IMPLANT
SUT VIC AB 3-0 SH 27 (SUTURE) ×2
SUT VIC AB 3-0 SH 27XBRD (SUTURE) IMPLANT
SUT VIC AB 4-0 SH 27 (SUTURE) ×4
SUT VIC AB 4-0 SH 27XBRD (SUTURE) IMPLANT
SYR 30ML LL (SYRINGE) ×1 IMPLANT
SYRINGE IRR TOOMEY STRL 70CC (SYRINGE) ×1 IMPLANT
TOWEL BLUE STERILE X RAY DET (MISCELLANEOUS) ×1 IMPLANT
TOWEL OR 17X26 10 PK STRL BLUE (TOWEL DISPOSABLE) ×4 IMPLANT
WATER STERILE IRR 1500ML POUR (IV SOLUTION) ×1 IMPLANT
YANKAUER SUCT BULB TIP NO VENT (SUCTIONS) ×1 IMPLANT

## 2014-09-13 NOTE — Anesthesia Postprocedure Evaluation (Signed)
  Anesthesia Post-op Note  Patient: Jennifer MartinetSusan N Sanford  Procedure(s) Performed: Procedure(s) (LRB): OPEN RIGHT URETERAL REIMPLANT (Right)  Patient Location: PACU  Anesthesia Type: GA combined with regional for post-op pain  Level of Consciousness: awake and alert   Airway and Oxygen Therapy: Patient Spontanous Breathing  Post-op Pain: mild  Post-op Assessment: Post-op Vital signs reviewed, Patient's Cardiovascular Status Stable, Respiratory Function Stable, Patent Airway and No signs of Nausea or vomiting  Last Vitals:  Filed Vitals:   09/13/14 1430  BP: 99/61  Pulse: 64  Temp: 36.3 C  Resp: 13    Post-op Vital Signs: stable   Complications: No apparent anesthesia complications. Epidural orders per Dr. Renold DonGermeroth.

## 2014-09-13 NOTE — Progress Notes (Signed)
Patient under care of Cathlean Cowerarlene Whitlow, R.N.- while pre-op surgical procedure being done.

## 2014-09-13 NOTE — Progress Notes (Signed)
Anesthesia Epidural Note  Pt complaining of 10/10 pain, lower abdominal. Bolused. Pain rapidly improved to 3/10. Infusion increased to 14 ml/hr. Site CDI. Discussed with pt that epidural was placed slightly higher than incision to ensure her best chance of being able to ambulate in the AM.  Please call with other concerns  [facility]  Phone [phone]

## 2014-09-13 NOTE — Transfer of Care (Signed)
Immediate Anesthesia Transfer of Care Note  Patient: Jennifer Sanford  Procedure(s) Performed: Procedure(s): OPEN RIGHT URETERAL REIMPLANT (Right)  Patient Location: PACU  Anesthesia Type:General  Level of Consciousness: awake, alert  and oriented  Airway & Oxygen Therapy: Patient Spontanous Breathing and Patient connected to nasal cannula oxygen  Post-op Assessment: Report given to PACU RN and Post -op Vital signs reviewed and stable  Post vital signs: Reviewed and unstable  Complications: No apparent anesthesia complications

## 2014-09-13 NOTE — Progress Notes (Signed)
Dr. Renold DonGermeroth in- made aware of patient's blood pressures

## 2014-09-13 NOTE — Progress Notes (Signed)
Dr. Renold DonGermeroth in- aware of patient's blood pressures-made aware of patient moving all extremities -having numbness of both legs and feet.

## 2014-09-13 NOTE — Anesthesia Preprocedure Evaluation (Signed)
Anesthesia Evaluation  Patient identified by MRN, date of birth, ID band Patient awake    Reviewed: Allergy & Precautions, H&P , NPO status , Patient's Chart, lab work & pertinent test results  History of Anesthesia Complications Negative for: history of anesthetic complications  Airway Mallampati: II  TM Distance: >3 FB Neck ROM: Full    Dental no notable dental hx.    Pulmonary neg pulmonary ROS,  breath sounds clear to auscultation  Pulmonary exam normal       Cardiovascular hypertension, Pt. on medications Rhythm:Regular Rate:Normal     Neuro/Psych PSYCHIATRIC DISORDERS Depression  Neuromuscular disease    GI/Hepatic Neg liver ROS, GERD-  Medicated,  Endo/Other  negative endocrine ROS  Renal/GU Renal disease     Musculoskeletal  (+) Arthritis -,   Abdominal (+) + obese,   Peds  Hematology  (+) anemia ,   Anesthesia Other Findings   Reproductive/Obstetrics                             Anesthesia Physical  Anesthesia Plan  ASA: II  Anesthesia Plan: General   Post-op Pain Management:    Induction: Intravenous  Airway Management Planned: Oral ETT  Additional Equipment:   Intra-op Plan:   Post-operative Plan: Extubation in OR  Informed Consent: I have reviewed the patients History and Physical, chart, labs and discussed the procedure including the risks, benefits and alternatives for the proposed anesthesia with the patient or authorized representative who has indicated his/her understanding and acceptance.   Dental advisory given  Plan Discussed with: CRNA  Anesthesia Plan Comments:         Anesthesia Quick Evaluation

## 2014-09-13 NOTE — Op Note (Signed)
Preoperative diagnosis:  1. Right distal ureteral stricture   Postoperative diagnosis:  1. same   Procedure: 1. Open right ureteral reimplant 2. Lysis of adhesions  Surgeon: Crist FatBenjamin W. Herrick, MD First assistant: Dr. Su GrandMarc Nesi, M.D.  Anesthesia: General with spinal epidural  Complications: small colonic enterotomy sigmoid colon was repaired primarily.   Intraoperative findings: There were dense adhesions both the colon and small intestines in the pelvis, this required extensive lysis of adhesions in order to get the bowel out of the pelvis so that we could perform the reimplant. There was a small sigmoid colon enterotomy which was closed primarily with 3-0 Vicryl and then imbricated with 3-0 silk.  EBL: 150cc  Specimens: None  Indication: Jennifer Sanford is a 63 y.o. patient with a right ureteral stricture secondary to a gynecologic misadventure during a laparoscopic-assisted vaginal hysterectomy.  The patient has had a failed reimplanted and over the past 5 years has been managed with intermittent indwelling stents.  This point the patient is ready to proceed with definitive therapy.  A Lasix renogram confirms 49% function of the right kidney.the risks and benefits as well as the numerous possibilities were discussed with the patient in regards to her repair.  She was prepared for a reimplant with a psoas hitch, biaori flap, or a ileal interposition graft.  After reviewing the management options for treatment, she elected to proceed with the above surgical procedure(s). We have discussed the potential benefits and risks of the procedure, side effects of the proposed treatment, the likelihood of the patient achieving the goals of the procedure, and any potential problems that might occur during the procedure or recuperation. Informed consent has been obtained.  Description of procedure:  An epidural was placed prior to the patient proceeding to the operating room.  Once in the operating  room general anesthesia was induced.the patient was then placed on the table and prepped and draped in the routine sterile fashion.  After an adequate timeout with confirmation of antibiotics as well as the appropriate site and laterality a Foley catheter was placed for sterile conditions.  The Foley catheter was then capped.  A lower midline incision was then made with a 15 blade and carried down through the patient's scar tissue to her rectus fascia.  The rectus fascia was then incised and access to the peritoneal cavity was obtained.  We then diligently opened up the posterior rectus fascia in the peritoneal cavity taking care to avoid any adhesive bowel.  This point we began lysing the dense adhesions that had formed around the pelvis and the bladder at the site of the patient's previous reimplant on the right sidewall.  There was an inadvertent enterotomy made in the sigmoid colon which was repaired primarily using a 3-0 Vicryl in a running fashion and then imbricated with a 3-0 silk.  We then continued lysing adhesions in order to get the bowel off the bladder and the pelvic sidewalls.  Once the bowel had been freed from the pelvis a Bookwalter retractor was placed over the patient and the bowel was retracted into the patient's abdomen and several body wall retractors were placed as well as long malleable retractors keeping the bowel out of the working space sidewalls retracted open.  The previous right ureteral implant at the dome of the right side of the bladder was easily recognized and the ureter was then meticulously dissected off of the surrounding tissues.  It was fairly densely adhered to the external iliac artery and vein.  Using Metzenbaum scissors this was carefully freed up taking care not to injure any of the blood vessels. Once the ureter had been freed proximally and there was enough laxity so that we could perform a tension-free reimplant I placed 2 holding sutures in the bladder wall at the  dome and open the bladder longitudinally.  I then tied off the right ureter at the ureterovesical junction using a 2-0 Vicryl and cut the ureter proximal.I then spatulated the ureter for approximately 15 mm.  I then made a separate cystotomy more medially and towards the dome of the bladder and pulled the Ureter through to the inside of the bladder.  I then did a vesicoureteral reanastomosis starting at the crotch of the spatulated aspect with 2 separate 2-0 Vicryl sutures and running around each side to the apex of the ureter.  Once the ureter was adequately sewn into the bladder I passed a 6 French 26 cm double-J stent into the right ureter up into the right renal pelvis.  There was a nice curl noted within the bladder as well.  I then copiously irrigated the lumen of the bladder ensuring to remove all blood clots.  I then closed the bladder in 2 layers, the first layer was with 3-0 Vicryl.  The second layer was with a 2-0 Vicryl.  Each layer was performed in a running fashion.  I then reinspected the bowel for any serosal injuries.  There were several that were concerning and for which we reapproximated the serosa with 3-0 silk ties.  The remaining bowel appeared to be in good shape.  We then meticulously irrigated the peritoneum.  I placed a 4119 [place]French Blake drain through the right lateral sidewall of the patient's abdomen and placed a drain at the new right ureterovesical junction.  The drain was secured with a 3-0 nylon stitch.  I then placed the omental apron back over top of the bowel and closed the rectus fascia with a 10 looped PDS starting from each end and meeting in the [place].  The subcutaneous tissue was then copiously irrigated and the incision was closed with skin staples.  Sterile dressings and a drain sponge were then applied to the incision and the drain site.  The catheter was left to gravity.  The patient was subsequently awoken and returned to the PACU in stable condition.  At the end of  the case all laps and needles and sponges had been accounted for.   Crist FatBenjamin W. Herrick, M.D.

## 2014-09-13 NOTE — Progress Notes (Signed)
Dr. Marlou PorchHerrick made aware of Heparin order- stated to hold Heparin until further notice- talked with Juanda CrumbleMichael Nanney, R.N.

## 2014-09-13 NOTE — Progress Notes (Signed)
CBC and B Met drawn by lab

## 2014-09-13 NOTE — Progress Notes (Signed)
Dr. Renold DonGermeroth made aware of patient's blood pressures- [facility]

## 2014-09-13 NOTE — Progress Notes (Signed)
AssistedDr. Renold DonGermeroth with epidual block. Side rails up, monitors on throughout procedure. See vital signs in flow sheet. Tolerated Procedure well.

## 2014-09-13 NOTE — Progress Notes (Signed)
Dr. Renold DonGermeroth made aware of patient's blood pressures and heart rates- patient to be bolused with 1000cc LR  I.V. Rapidly in PACU

## 2014-09-13 NOTE — Anesthesia Procedure Notes (Signed)
Epidural Patient location during procedure: OB  Staffing Anesthesiologist: Lewie LoronGERMEROTH, Suheyla Mortellaro R Performed by: anesthesiologist   Preanesthetic Checklist Completed: patient identified, pre-op evaluation, timeout performed, IV checked, risks and benefits discussed and monitors and equipment checked  Epidural Patient position: sitting Prep: site prepped and draped and DuraPrep Patient monitoring: heart rate Approach: midline Location: thoracic (1-12) (T9-10) Injection technique: LOR air and LOR saline  Needle:  Needle type: Tuohy  Needle gauge: 17 G Needle length: 9 cm Needle insertion depth: 8 cm Catheter type: closed end flexible Catheter size: 19 Gauge Catheter at skin depth: 14 cm Test dose: negative and 1.5% lidocaine with Epi 1:200 K  Assessment Sensory level: T8 Events: blood not aspirated, injection not painful, no injection resistance, negative IV test and no paresthesia  Additional Notes Reason for block:procedure for pain

## 2014-09-13 NOTE — Interval H&P Note (Signed)
History and Physical Interval Note:  09/13/2014 7:34 AM  Jennifer Sanford  has presented today for surgery, with the diagnosis of RIGHT URETERAL STRICTURE  The various methods of treatment have been discussed with the patient and family. After consideration of risks, benefits and other options for treatment, the patient has consented to  Procedure(s): OPEN RIGHT URETERAL REIMPLANT (Right) as a surgical intervention .  The patient's history has been reviewed, patient examined, no change in status, stable for surgery.  I have reviewed the patient's chart and labs.  Questions were answered to the patient's satisfaction.     Berniece SalinesHERRICK, BENJAMIN W

## 2014-09-14 ENCOUNTER — Encounter (HOSPITAL_COMMUNITY): Payer: Self-pay | Admitting: Urology

## 2014-09-14 LAB — BASIC METABOLIC PANEL
Anion gap: 3 — ABNORMAL LOW (ref 5–15)
BUN: 13 mg/dL (ref 6–23)
CO2: 28 mmol/L (ref 19–32)
Calcium: 8.3 mg/dL — ABNORMAL LOW (ref 8.4–10.5)
Chloride: 106 mEq/L (ref 96–112)
Creatinine, Ser: 1.01 mg/dL (ref 0.50–1.10)
GFR calc Af Amer: 68 mL/min — ABNORMAL LOW (ref >90)
GFR calc non Af Amer: 59 mL/min — ABNORMAL LOW (ref >90)
Glucose, Bld: 108 mg/dL — ABNORMAL HIGH (ref 70–99)
Potassium: 3.8 mmol/L (ref 3.5–5.1)
Sodium: 137 mmol/L (ref 135–145)

## 2014-09-14 LAB — CBC
HCT: 32 % — ABNORMAL LOW (ref 36.0–46.0)
Hemoglobin: 10.4 g/dL — ABNORMAL LOW (ref 12.0–15.0)
MCH: 29 pg (ref 26.0–34.0)
MCHC: 32.5 g/dL (ref 30.0–36.0)
MCV: 89.1 fL (ref 78.0–100.0)
Platelets: 249 10*3/uL (ref 150–400)
RBC: 3.59 MIL/uL — ABNORMAL LOW (ref 3.87–5.11)
RDW: 14 % (ref 11.5–15.5)
WBC: 10.1 10*3/uL (ref 4.0–10.5)

## 2014-09-14 MED ORDER — KETOROLAC TROMETHAMINE 15 MG/ML IJ SOLN
INTRAMUSCULAR | Status: AC
  Administered 2014-09-14: 15 mg
  Filled 2014-09-14: qty 1

## 2014-09-14 MED ORDER — KCL IN DEXTROSE-NACL 20-5-0.45 MEQ/L-%-% IV SOLN
INTRAVENOUS | Status: DC
  Administered 2014-09-14 – 2014-09-15 (×3): via INTRAVENOUS
  Filled 2014-09-14 (×7): qty 1000

## 2014-09-14 MED ORDER — DSS 100 MG PO CAPS: 100.0000 mg | ORAL_CAPSULE | Freq: Two times a day (BID) | ORAL | Status: DC

## 2014-09-14 MED ORDER — POTASSIUM CHLORIDE 2 MEQ/ML IV SOLN: INTRAVENOUS | Status: DC

## 2014-09-14 MED ORDER — OXYCODONE HCL 5 MG PO TABS: 5.0000 mg | ORAL_TABLET | ORAL | Status: DC | PRN

## 2014-09-14 MED ORDER — ACETAMINOPHEN 10 MG/ML IV SOLN
1000.0000 mg | Freq: Four times a day (QID) | INTRAVENOUS | Status: AC
  Administered 2014-09-14: 1000 mg via INTRAVENOUS
  Filled 2014-09-14: qty 100

## 2014-09-14 NOTE — Progress Notes (Signed)
Patient doing well this PM. Plan to slowly advance diet D/C epidural tomorrow in AM - start oral pain meds Remove JP if creatinine from drain is consistent with serum. Hopefully, she'll be ready for discharge home with foley catheter on 12/25.

## 2014-09-14 NOTE — Evaluation (Signed)
Physical Therapy Evaluation Patient Details Name: Simonne MartinetSusan N Sachdev MRN: [id number] DOB: 05/04/1951 Today's Date: 09/14/2014   History of Present Illness  63 yo female s/p ureteral reimplantation 12/22.   Clinical Impression  On eval, pt required Min guard assist to stand and ambulate ~100 feet with use of IV pole for support. Demonstrates general weakness and decreased activity tolerance. Mobility also limited by pain. Pt has been ambulating with assistance of nursing and family. Will follow.    Follow Up Recommendations Home health PT;Supervision - Intermittent    Equipment Recommendations  None recommended by PT    Recommendations for Other Services OT consult     Precautions / Restrictions Precautions Precautions: Fall Precaution Comments: epidural, drain. Restrictions Weight Bearing Restrictions: No      Mobility  Bed Mobility               General bed mobility comments: pt OOB in recliner  Transfers Overall transfer level: Needs assistance   Transfers: Sit to/from Stand Sit to Stand: Min guard         General transfer comment: close guard for safety. Increased time.   Ambulation/Gait Ambulation/Gait assistance: Min guard Ambulation Distance (Feet): 100 Feet Assistive device:  (IV pole) Gait Pattern/deviations: Step-through pattern;Decreased stride length     General Gait Details: slow gait speed. close guard for safety. fatigues fairly easily.   Stairs            Wheelchair Mobility    Modified Rankin (Stroke Patients Only)       Balance                                             Pertinent Vitals/Pain Pain Assessment: 0-10 Pain Score: 7  Pain Location: back Pain Intervention(s): Monitored during session    Home Living Family/patient expects to be discharged to:: Private residence Living Arrangements: Alone Available Help at Discharge: Friend(s);Available 24 hours/day Type of Home: House Home Access: Stairs to  enter Entrance Stairs-Rails: None Entrance Stairs-Number of Steps: 1 Home Layout: Two level;Able to live on main level with bedroom/bathroom        Prior Function Level of Independence: Independent               Hand Dominance        Extremity/Trunk Assessment   Upper Extremity Assessment: Overall WFL for tasks assessed           Lower Extremity Assessment: Generalized weakness      Cervical / Trunk Assessment: Normal  Communication   Communication: No difficulties  Cognition Arousal/Alertness: Awake/alert Behavior During Therapy: WFL for tasks assessed/performed Overall Cognitive Status: Within Functional Limits for tasks assessed                      General Comments      Exercises        Assessment/Plan    PT Assessment Patient needs continued PT services  PT Diagnosis Difficulty walking;Generalized weakness;Acute pain   PT Problem List Decreased strength;Decreased activity tolerance;Decreased balance;Decreased mobility;Pain  PT Treatment Interventions Gait training;Stair training;Functional mobility training;Therapeutic exercise;Therapeutic activities;Patient/family education;Balance training   PT Goals (Current goals can be found in the Care Plan section) Acute Rehab PT Goals Patient Stated Goal: to go home after hospital stay PT Goal Formulation: With patient Time For Goal Achievement: 09/28/14 Potential to Achieve Goals: Good  Frequency Min 3X/week   Barriers to discharge        Co-evaluation               End of Session   Activity Tolerance: Patient limited by fatigue;Patient limited by pain Patient left: in chair;with call bell/phone within reach;with family/visitor present           Time: 1147-1208 PT Time Calculation (min) (ACUTE ONLY): 21 min   Charges:   PT Evaluation $Initial PT Evaluation Tier I: 1 Procedure PT Treatments $Gait Training: 8-22 mins   PT G Codes:          Rebeca AlertJannie Patric Buckhalter,  MPT Pager: [phone]

## 2014-09-14 NOTE — Progress Notes (Signed)
Urology Inpatient Progress Report S/p right open ureteral reimplant, 09/13/14  Intv/Subj: Needed epidural  bolused yesterday, otherwise pain has been well controlled with epidural, IV tylenol and Toradol No acute events overnight. Patient is without complaint. Intermittent nausea, no emesis Has not been out of bed  Past Medical History  Diagnosis Date  . Hypertension   . Depression   . GERD (gastroesophageal reflux disease)   . Complication of anesthesia     VERY CLAUSTROPHIC W/ MASK  . Ureteral stricture, right   . Hydronephrosis, right   . Hyperlipemia   . OA (osteoarthritis)    Current Facility-Administered Medications  Medication Dose Route Frequency Provider Last Rate Last Dose  . acetaminophen (OFIRMEV) IV 1,000 mg  1,000 mg Intravenous Q6H Crist FatBenjamin W Herrick, MD   1,000 mg at 09/14/14 0352  . bisacodyl (DULCOLAX) suppository 10 mg  10 mg Rectal Daily PRN Crist FatBenjamin W Herrick, MD      . citalopram (CELEXA) tablet 20 mg  20 mg Oral q morning - 10a Crist FatBenjamin W Herrick, MD   20 mg at 09/13/14 1800  . dextrose 5 % in lactated ringers infusion   Intravenous Continuous Crist FatBenjamin W Herrick, MD 125 mL/hr at 09/14/14 0103    . docusate sodium (COLACE) capsule 100 mg  100 mg Oral BID Crist FatBenjamin W Herrick, MD   100 mg at 09/13/14 2204  . HYDROmorphone (DILAUDID) injection 0.5 mg  0.5 mg Intravenous Q2H PRN Crist FatBenjamin W Herrick, MD   0.5 mg at 09/13/14 1743  . ketorolac (TORADOL) 30 MG/ML injection 15 mg  15 mg Intravenous Q6H PRN Crist FatBenjamin W Herrick, MD   15 mg at 09/14/14 0104  . ondansetron (ZOFRAN) injection 4 mg  4 mg Intravenous Q6H PRN Crist FatBenjamin W Herrick, MD   4 mg at 09/13/14 1734  . opium-belladonna (B&O SUPPRETTES) suppository 1 suppository  1 suppository Rectal Q8H PRN Crist FatBenjamin W Herrick, MD      . oxyCODONE (Oxy IR/ROXICODONE) immediate release tablet 5-10 mg  5-10 mg Oral Q4H PRN Crist FatBenjamin W Herrick, MD      . pantoprazole (PROTONIX) EC tablet 40 mg  40 mg Oral q morning - 10a Crist FatBenjamin  W Herrick, MD      . ropivacaine (PF) 2 mg/ml (0.2%) (NAROPIN) epidural  14 mL/hr Epidural Continuous Gaylan GeroldJohn R Germeroth, MD 14 mL/hr at 09/14/14 0101 14 mL/hr at 09/14/14 0101  . triamterene-hydrochlorothiazide (MAXZIDE-25) 37.5-25 MG per tablet 0.5 tablet  0.5 tablet Oral q morning - 10a Crist FatBenjamin W Herrick, MD      . zolpidem [facility](AMBIEN) tablet 5 mg  5 mg Oral QHS PRN Crist FatBenjamin W Herrick, MD         Objective: Vital: Filed Vitals:   09/13/14 2050 09/14/14 0101 09/14/14 0210 09/14/14 0559  BP: 104/59  114/73 122/80  Pulse: 74  74 72  Temp: 97.8 F (36.6 C)  98.2 F (36.8 C) 98.4 F (36.9 C)  TempSrc: Oral  Oral Oral  Resp: 17 17 16 12   Height:      Weight:      SpO2: 100% 99% 100% 100%   I/Os: I/O last 3 completed shifts: In: 3705 [P.O.:480; I.V.:2125; IV Piggyback:1100] Out: 485 [Urine:150; Drains:185; Blood:150]  Physical Exam:  General: Patient is in no apparent distress Lungs: Normal respiratory effort, chest expands symmetrically. GI: The abdomen is soft, incision is c/d/i JP draining serosang with reasonable volume Foley is blood tinged Ext: lower extremities symmetric  Lab Results:  Recent Labs  09/13/14 1215 09/14/14 0525  WBC 13.1* 10.1  HGB 11.0* 10.4*  HCT 33.3* 32.0*    Recent Labs  09/13/14 1215 09/14/14 0525  NA 137 137  K 4.4 3.8  CL 103 106  CO2 29 28  GLUCOSE 128* 108*  BUN 17 13  CREATININE 1.18* 1.01  CALCIUM 8.6 8.3*   No results for input(s): LABPT, INR in the last 72 hours. No results for input(s): LABURIN in the last 72 hours. Results for orders placed or performed during the hospital encounter of 09/09/14  Urine culture     Status: None   Collection Time: 09/09/14  8:37 AM  Result Value Ref Range Status   Specimen Description URINE, CLEAN CATCH  Final   Special Requests NONE  Final   Culture  Setup Time   Final    09/09/2014 10:27 Performed at [facility]    Colony Count   Final    5,000 COLONIES/ML Performed at  [facility]    Culture   Final    INSIGNIFICANT GROWTH Performed at [facility]    Report Status 09/10/2014 FINAL  Final    Studies/Results:   Assessment: 1 Day Post-Op s/p right open ureteral reimplant, doing well  Plan: Would keep epidural today and removal tomorrow Up and moving today ADAT - advised patient to take it slow MIVF Keep drain in for now Home 12/25 if things are uncomplicated.  Berniece SalinesHERRICK, BENJAMIN W 09/14/2014, 6:59 AM

## 2014-09-14 NOTE — Discharge Instructions (Signed)

## 2014-09-15 LAB — CREATININE, FLUID (PLEURAL, PERITONEAL, JP DRAINAGE): Creat, Fluid: 1.2 mg/dL

## 2014-09-15 MED ORDER — HEPARIN SODIUM (PORCINE) 5000 UNIT/ML IJ SOLN
5000.0000 [IU] | Freq: Three times a day (TID) | INTRAMUSCULAR | Status: DC
  Administered 2014-09-15 – 2014-09-16 (×2): 5000 [IU] via SUBCUTANEOUS
  Filled 2014-09-15 (×3): qty 1

## 2014-09-15 MED ORDER — ONDANSETRON HCL 4 MG PO TABS
4.0000 mg | ORAL_TABLET | Freq: Four times a day (QID) | ORAL | Status: DC | PRN
  Administered 2014-09-15: 4 mg via ORAL
  Filled 2014-09-15: qty 1

## 2014-09-15 MED ORDER — KETOROLAC TROMETHAMINE 60 MG/2ML IM SOLN
30.0000 mg | Freq: Four times a day (QID) | INTRAMUSCULAR | Status: AC
  Administered 2014-09-15 – 2014-09-16 (×4): 30 mg via INTRAMUSCULAR
  Filled 2014-09-15 (×4): qty 2

## 2014-09-15 NOTE — Progress Notes (Signed)
Came to visit patient on behalf of [employer] program for [employer] employees/dependents with [employer] insurance. Appreciative of visit.  Raiford NobleAtika Korah Hufstedler, MSN- RN,BSN- [facility] [phone]

## 2014-09-15 NOTE — Progress Notes (Signed)
Urology Inpatient Progress Report S/p right open ureteral reimplant, 09/13/14  Intv/Subj: Worked with PT, doing well with them Passing flatus, Tolerated full liquids Pain reasonably well  controlled  Past Medical History  Diagnosis Date  . Hypertension   . Depression   . GERD (gastroesophageal reflux disease)   . Complication of anesthesia     VERY CLAUSTROPHIC W/ MASK  . Ureteral stricture, right   . Hydronephrosis, right   . Hyperlipemia   . OA (osteoarthritis)    Current Facility-Administered Medications  Medication Dose Route Frequency Provider Last Rate Last Dose  . bisacodyl (DULCOLAX) suppository 10 mg  10 mg Rectal Daily PRN Crist FatBenjamin W Herrick, MD      . citalopram (CELEXA) tablet 20 mg  20 mg Oral q morning - 10a Crist FatBenjamin W Herrick, MD   20 mg at 09/14/14 1001  . dextrose 5 % and 0.45 % NaCl with KCl 20 mEq/L infusion   Intravenous Continuous Crist FatBenjamin W Herrick, MD 100 mL/hr at 09/15/14 [phone]    . docusate sodium (COLACE) capsule 100 mg  100 mg Oral BID Crist FatBenjamin W Herrick, MD   100 mg at 09/14/14 2102  . HYDROmorphone (DILAUDID) injection 0.5 mg  0.5 mg Intravenous Q2H PRN Crist FatBenjamin W Herrick, MD   0.5 mg at 09/13/14 1743  . ondansetron (ZOFRAN) injection 4 mg  4 mg Intravenous Q6H PRN Crist FatBenjamin W Herrick, MD   4 mg at 09/13/14 1734  . opium-belladonna (B&O SUPPRETTES) suppository 1 suppository  1 suppository Rectal Q8H PRN Crist FatBenjamin W Herrick, MD      . oxyCODONE (Oxy IR/ROXICODONE) immediate release tablet 5-10 mg  5-10 mg Oral Q4H PRN Crist FatBenjamin W Herrick, MD      . pantoprazole (PROTONIX) EC tablet 40 mg  40 mg Oral q morning - 10a Crist FatBenjamin W Herrick, MD   40 mg at 09/14/14 1001  . ropivacaine (PF) 2 mg/ml (0.2%) (NAROPIN) epidural  14 mL/hr Epidural Continuous Gaylan GeroldJohn R Germeroth, MD 14 mL/hr at 09/15/14 0658 14 mL/hr at 09/15/14 0658  . triamterene-hydrochlorothiazide (MAXZIDE-25) 37.5-25 MG per tablet 0.5 tablet  0.5 tablet Oral q morning - 10a Crist FatBenjamin W Herrick, MD   0.5  tablet at 09/14/14 1001  . zolpidem (AMBIEN) tablet 5 mg  5 mg Oral QHS PRN Crist FatBenjamin W Herrick, MD         Objective: Vital: Filed Vitals:   09/14/14 2300 09/15/14 0201 09/15/14 0613 09/15/14 0658  BP:  131/81 122/81   Pulse:  65 69   Temp:  98.4 F (36.9 C) 98.3 F (36.8 C)   TempSrc:  Oral Oral   Resp:  18 14 17   Height:      Weight:      SpO2: 98% 98% 98% 100%   I/Os: I/O last 3 completed shifts: In: 4081.7 [P.O.:680; I.V.:3101.7; IV Piggyback:300] Out: 5025 [Urine:4850; Drains:175]  Physical Exam:  General: Patient is in no apparent distress Lungs: Normal respiratory effort, chest expands symmetrically. GI: The abdomen is soft, incision is c/d/i JP draining serosang with reasonable volume Foley is blood tinged Ext: lower extremities symmetric  Lab Results:  Recent Labs  09/13/14 1215 09/14/14 0525  WBC 13.1* 10.1  HGB 11.0* 10.4*  HCT 33.3* 32.0*    Recent Labs  09/13/14 1215 09/14/14 0525  NA 137 137  K 4.4 3.8  CL 103 106  CO2 29 28  GLUCOSE 128* 108*  BUN 17 13  CREATININE 1.18* 1.01  CALCIUM 8.6 8.3*   No results for input(s):  LABPT, INR in the last 72 hours. No results for input(s): LABURIN in the last 72 hours. Results for orders placed or performed during the hospital encounter of 09/09/14  Urine culture     Status: None   Collection Time: 09/09/14  8:37 AM  Result Value Ref Range Status   Specimen Description URINE, CLEAN CATCH  Final   Special Requests NONE  Final   Culture  Setup Time   Final    09/09/2014 10:27 Performed at [facility]    Colony Count   Final    5,000 COLONIES/ML Performed at [facility]    Culture   Final    INSIGNIFICANT GROWTH Performed at [facility]    Report Status 09/10/2014 FINAL  Final    Studies/Results:   Assessment: 2 Days Post-Op s/p right open ureteral reimplant, doing well  Plan: Hold heparin this AM Give 10mg  oxycodone and then stop epidural.  D/c if pain  reasonable well controlled with pain meds ADAT Remove drain if JP creatinine consistent with serum Continue to work with PT Expect will be ready for d/c tomorrow.  Crist FatHERRICK, BENJAMIN W 09/15/2014, 7:43 AM

## 2014-09-15 NOTE — Addendum Note (Signed)
Addendum  created 09/15/14 1240 by Phillips GroutPeter Jermani Pund, MD   Modules edited: Clinical Notes, Lines/Drains/Airways Properties Editor   Clinical Notes:  File: [id number]   Lines/Drains/Airways Properties Editor:  Line/drain/airway/wound Epidural Catheter 09/13/14 has been removed from the patient.

## 2014-09-15 NOTE — Progress Notes (Signed)
POD#2.  Epidural stopped at 9am. Now on PO pain meds. Pt is comfortable. Sitting up eating lunch. No c/o.  Epidural removed, tip intact. Site clean and dry. Moving bil LE.  [employer]

## 2014-09-16 MED ORDER — TRIMETHOPRIM 100 MG PO TABS: 100.0000 mg | ORAL_TABLET | ORAL | Status: DC

## 2014-09-16 MED ORDER — OXYCODONE-ACETAMINOPHEN 5-325 MG PO TABS: 1.0000 | ORAL_TABLET | ORAL | Status: DC | PRN

## 2014-09-16 NOTE — Progress Notes (Signed)
Urology Progress Note  3 Days Post-Op   Subjective: post op Right ureteral re-implantation. Pt doing well, and desires d/c today.      No acute urologic events overnight. Ambulation:   positive Flatus:    positive Bowel movement  negative  Pain: some relief  Objective:  Blood pressure 112/64, pulse 66, temperature 98.1 F (36.7 C), temperature source Oral, resp. rate 16, height 5\' 10"  (1.778 m), weight 91.627 kg (202 lb), SpO2 100 %.  Physical Exam:  General:  No acute distress, awake  Genitourinary:  Catheter draining well Epidural out Foley:  remains    I/O last 3 completed shifts: In: 2525 [P.O.:320; I.V.:2105; IV Piggyback:100] Out: 3705 [Urine:3650; Drains:55]  Recent Labs     09/14/14  0525  HGB  10.4*  WBC  10.1  PLT  249    Recent Labs     09/14/14  0525  NA  137  K  3.8  CL  106  CO2  28  BUN  13  CREATININE  1.01  CALCIUM  8.3*  GFRNONAA  59*  GFRAA  68*     No results for input(s): INR, APTT in the last 72 hours.  Invalid input(s): PT   Invalid input(s): ABG  Assessment/Plan:  Catheter not removed.  D/c today.

## 2014-09-18 NOTE — Discharge Summary (Signed)
Date of admission: 09/13/2014  Date of discharge: 09/18/2014  Admission diagnosis: right ureteral stricture  Discharge diagnosis: right ureteral reimplant  Secondary diagnoses:  Patient Active Problem List   Diagnosis Date Noted  . S/P ureteral reimplantation 09/13/2014  . Postoperative stiffness of total knee replacement 06/01/2013  . S/P bilateral unicompartmental knee replacement 03/22/2013  . Postop Transfusion 03/21/2013  . Postoperative anemia due to acute blood loss 03/18/2013  . Hyponatremia 03/18/2013  . OA (osteoarthritis) of knee 03/17/2013  . TENOSYNOVITIS OF FOOT AND ANKLE 08/28/2009  . FOOT PAIN, RIGHT 08/28/2009  . PES PLANUS 08/28/2009  . ABNORMALITY OF GAIT 08/28/2009    History and Physical: For full details, please see admission history and physical. Briefly, Jennifer Sanford is a 63 y.o. year old patient with long history of right distal ureteral stricture.   Hospital Course: Patient tolerated the procedure well.  She was then transferred to the floor after an uneventful PACU stay.  Her hospital course was uncomplicated.  On POD#3  she had met discharge criteria: was eating a regular diet, was up and ambulating independently,  pain was well controlled, was voiding without a catheter, and was ready to for discharge.   Laboratory values:  No results for input(s): WBC, HGB, HCT in the last 72 hours. No results for input(s): NA, K, CL, CO2, GLUCOSE, BUN, CREATININE, CALCIUM in the last 72 hours. No results for input(s): LABPT, INR in the last 72 hours. No results for input(s): LABURIN in the last 72 hours. Results for orders placed or performed during the hospital encounter of 09/09/14  Urine culture     Status: None   Collection Time: 09/09/14  8:37 AM  Result Value Ref Range Status   Specimen Description URINE, CLEAN CATCH  Final   Special Requests NONE  Final   Culture  Setup Time   Final    09/09/2014 10:27 Performed at [place]    Final    5,000 COLONIES/ML Performed at [place]   Final    INSIGNIFICANT GROWTH Performed at [facility]    Report Status 09/10/2014 FINAL  Final    Disposition: Home  Discharge instruction: The patient was instructed to be ambulatory but told to refrain from heavy lifting, strenuous activity, or driving.   Discharge medications:    Medication List    STOP taking these medications        Fish Oil 1000 MG Caps      TAKE these medications        acetaminophen 500 MG tablet  Commonly known as:  TYLENOL  Take 1,000 mg by mouth every 6 (six) hours as needed for headache.     aspirin EC 81 MG tablet  Take 81 mg by mouth every morning.     citalopram 20 MG tablet  Commonly known as:  CELEXA  Take 20 mg by mouth every morning.     citalopram 10 MG tablet  Commonly known as:  CELEXA  Take 1 tablet (10 mg total) by mouth every morning.     DSS 100 MG Caps  Take 100 mg by mouth 2 (two) times daily.     fenofibrate 160 MG tablet  Take 160 mg by mouth every morning.     oxyCODONE 5 MG immediate release tablet  Commonly known as:  Oxy IR/ROXICODONE  Take 1-2 tablets (5-10 mg total) by mouth every 4 (four) hours as needed for severe  pain.     oxyCODONE-acetaminophen 5-325 MG per tablet  Commonly known as:  ROXICET  Take 1 tablet by mouth every 4 (four) hours as needed for severe pain.     pantoprazole 40 MG tablet  Commonly known as:  PROTONIX  Take 40 mg by mouth every morning.     phentermine 37.5 MG tablet  Commonly known as:  ADIPEX-P  Take 18.75 mg by mouth daily before breakfast.     pyridOXINE 100 MG tablet  Commonly known as:  VITAMIN B-6  Take 100 mg by mouth every morning.     triamterene-hydrochlorothiazide 37.5-25 MG per tablet  Commonly known as:  MAXZIDE-25  Take 0.5 tablets by mouth every morning.     trimethoprim 100 MG tablet  Commonly known as:  TRIMPEX  Take 1 tablet (100 mg total) by mouth 1 day or 1  dose.     vitamin B-12 1000 MCG tablet  Commonly known as:  CYANOCOBALAMIN  Take 1,000 mcg by mouth every morning.        Followup:      Follow-up Information    Follow up with Ardis Hughs, MD On 09/26/2014.   Specialty:  Urology   Why:  10:00am, For suture removal, For wound re-check   Contact information:   [place] [address] [phone]       Follow up with Ardis Hughs, MD.   Specialty:  Urology   Contact information:   [place] [id number] [phone]

## 2014-09-20 ENCOUNTER — Inpatient Hospital Stay (HOSPITAL_COMMUNITY)
Admission: EM | Admit: 2014-09-20 | Discharge: 2014-10-01 | DRG: 395 | Disposition: A | Discharge status: Home / Self Care | Payer: 59 | Attending: Urology | Admitting: Urology

## 2014-09-20 ENCOUNTER — Encounter (HOSPITAL_COMMUNITY): Payer: Self-pay | Admitting: *Deleted

## 2014-09-20 ENCOUNTER — Emergency Department (HOSPITAL_COMMUNITY): Payer: 59

## 2014-09-20 DIAGNOSIS — Z9889 Other specified postprocedural states: Secondary | ICD-10-CM

## 2014-09-20 DIAGNOSIS — I10 Essential (primary) hypertension: Secondary | ICD-10-CM | POA: Diagnosis present

## 2014-09-20 DIAGNOSIS — Z801 Family history of malignant neoplasm of trachea, bronchus and lung: Secondary | ICD-10-CM

## 2014-09-20 DIAGNOSIS — N135 Crossing vessel and stricture of ureter without hydronephrosis: Secondary | ICD-10-CM | POA: Diagnosis present

## 2014-09-20 DIAGNOSIS — K219 Gastro-esophageal reflux disease without esophagitis: Secondary | ICD-10-CM | POA: Diagnosis present

## 2014-09-20 DIAGNOSIS — Z7982 Long term (current) use of aspirin: Secondary | ICD-10-CM

## 2014-09-20 DIAGNOSIS — Z90710 Acquired absence of both cervix and uterus: Secondary | ICD-10-CM

## 2014-09-20 DIAGNOSIS — M1990 Unspecified osteoarthritis, unspecified site: Secondary | ICD-10-CM | POA: Diagnosis present

## 2014-09-20 DIAGNOSIS — R109 Unspecified abdominal pain: Secondary | ICD-10-CM | POA: Diagnosis not present

## 2014-09-20 DIAGNOSIS — Z833 Family history of diabetes mellitus: Secondary | ICD-10-CM

## 2014-09-20 DIAGNOSIS — R319 Hematuria, unspecified: Secondary | ICD-10-CM | POA: Diagnosis not present

## 2014-09-20 DIAGNOSIS — F329 Major depressive disorder, single episode, unspecified: Secondary | ICD-10-CM | POA: Diagnosis present

## 2014-09-20 DIAGNOSIS — Y838 Other surgical procedures as the cause of abnormal reaction of the patient, or of later complication, without mention of misadventure at the time of the procedure: Secondary | ICD-10-CM | POA: Diagnosis present

## 2014-09-20 DIAGNOSIS — K913 Postprocedural intestinal obstruction: Principal | ICD-10-CM | POA: Diagnosis present

## 2014-09-20 DIAGNOSIS — Z79899 Other long term (current) drug therapy: Secondary | ICD-10-CM

## 2014-09-20 DIAGNOSIS — K56600 Partial intestinal obstruction, unspecified as to cause: Secondary | ICD-10-CM

## 2014-09-20 DIAGNOSIS — K56609 Unspecified intestinal obstruction, unspecified as to partial versus complete obstruction: Secondary | ICD-10-CM

## 2014-09-20 DIAGNOSIS — Z79891 Long term (current) use of opiate analgesic: Secondary | ICD-10-CM

## 2014-09-20 DIAGNOSIS — E785 Hyperlipidemia, unspecified: Secondary | ICD-10-CM | POA: Diagnosis present

## 2014-09-20 DIAGNOSIS — Z8249 Family history of ischemic heart disease and other diseases of the circulatory system: Secondary | ICD-10-CM

## 2014-09-20 DIAGNOSIS — Z96653 Presence of artificial knee joint, bilateral: Secondary | ICD-10-CM | POA: Diagnosis present

## 2014-09-20 LAB — CBC WITH DIFFERENTIAL/PLATELET
Basophils Absolute: 0 10*3/uL (ref 0.0–0.1)
Basophils Relative: 0 % (ref 0–1)
Eosinophils Absolute: 0.2 10*3/uL (ref 0.0–0.7)
Eosinophils Relative: 2 % (ref 0–5)
HCT: 38.6 % (ref 36.0–46.0)
Hemoglobin: 13 g/dL (ref 12.0–15.0)
Lymphocytes Relative: 32 % (ref 12–46)
Lymphs Abs: 3.3 10*3/uL (ref 0.7–4.0)
MCH: 29.5 pg (ref 26.0–34.0)
MCHC: 33.7 g/dL (ref 30.0–36.0)
MCV: 87.7 fL (ref 78.0–100.0)
Monocytes Absolute: 0.7 10*3/uL (ref 0.1–1.0)
Monocytes Relative: 7 % (ref 3–12)
Neutro Abs: 6 10*3/uL (ref 1.7–7.7)
Neutrophils Relative %: 59 % (ref 43–77)
Platelets: 427 10*3/uL — ABNORMAL HIGH (ref 150–400)
RBC: 4.4 MIL/uL (ref 3.87–5.11)
RDW: 13.8 % (ref 11.5–15.5)
WBC: 10.2 10*3/uL (ref 4.0–10.5)

## 2014-09-20 LAB — URINALYSIS, ROUTINE W REFLEX MICROSCOPIC
Bilirubin Urine: NEGATIVE
Glucose, UA: NEGATIVE mg/dL
Ketones, ur: NEGATIVE mg/dL
Nitrite: NEGATIVE
Protein, ur: 100 mg/dL — AB
Specific Gravity, Urine: 1.019 (ref 1.005–1.030)
Urobilinogen, UA: 0.2 mg/dL (ref 0.0–1.0)
pH: 8.5 — ABNORMAL HIGH (ref 5.0–8.0)

## 2014-09-20 LAB — COMPREHENSIVE METABOLIC PANEL
ALT: 22 U/L (ref 0–35)
AST: 37 U/L (ref 0–37)
Albumin: 4 g/dL (ref 3.5–5.2)
Alkaline Phosphatase: 72 U/L (ref 39–117)
Anion gap: 10 (ref 5–15)
BUN: 16 mg/dL (ref 6–23)
CO2: 28 mmol/L (ref 19–32)
Calcium: 10 mg/dL (ref 8.4–10.5)
Chloride: 101 mEq/L (ref 96–112)
Creatinine, Ser: 0.98 mg/dL (ref 0.50–1.10)
GFR calc Af Amer: 71 mL/min — ABNORMAL LOW (ref >90)
GFR calc non Af Amer: 61 mL/min — ABNORMAL LOW (ref >90)
Glucose, Bld: 97 mg/dL (ref 70–99)
Potassium: 4 mmol/L (ref 3.5–5.1)
Sodium: 139 mmol/L (ref 135–145)
Total Bilirubin: 0.3 mg/dL (ref 0.3–1.2)
Total Protein: 8.4 g/dL — ABNORMAL HIGH (ref 6.0–8.3)

## 2014-09-20 LAB — URINE MICROSCOPIC-ADD ON

## 2014-09-20 LAB — BASIC METABOLIC PANEL
Anion gap: 7 (ref 5–15)
BUN: 14 mg/dL (ref 6–23)
CO2: 28 mmol/L (ref 19–32)
Calcium: 8.8 mg/dL (ref 8.4–10.5)
Chloride: 103 mEq/L (ref 96–112)
Creatinine, Ser: 0.92 mg/dL (ref 0.50–1.10)
GFR calc Af Amer: 76 mL/min — ABNORMAL LOW (ref >90)
GFR calc non Af Amer: 66 mL/min — ABNORMAL LOW (ref >90)
Glucose, Bld: 108 mg/dL — ABNORMAL HIGH (ref 70–99)
Potassium: 3.8 mmol/L (ref 3.5–5.1)
Sodium: 138 mmol/L (ref 135–145)

## 2014-09-20 LAB — LIPASE, BLOOD: Lipase: 21 U/L (ref 11–59)

## 2014-09-20 MED ORDER — ONDANSETRON HCL 4 MG/2ML IJ SOLN
4.0000 mg | Freq: Four times a day (QID) | INTRAMUSCULAR | Status: DC | PRN
Start: 2014-09-20 — End: 2014-10-01
  Administered 2014-09-20 – 2014-09-26 (×17): 4 mg via INTRAVENOUS
  Filled 2014-09-20 (×18): qty 2

## 2014-09-20 MED ORDER — SODIUM CHLORIDE 0.9 % IV BOLUS (SEPSIS)
1000.0000 mL | Freq: Once | INTRAVENOUS | Status: DC
  Administered 2014-09-20: 1000 mL via INTRAVENOUS

## 2014-09-20 MED ORDER — HYDROMORPHONE HCL 1 MG/ML IJ SOLN
0.5000 mg | INTRAMUSCULAR | Status: DC | PRN
  Administered 2014-09-22: 0.5 mg via INTRAVENOUS
  Filled 2014-09-20 (×4): qty 1

## 2014-09-20 MED ORDER — ONDANSETRON HCL 4 MG/2ML IJ SOLN
4.0000 mg | Freq: Once | INTRAMUSCULAR | Status: AC
  Administered 2014-09-20: 4 mg via INTRAVENOUS
  Filled 2014-09-20: qty 2

## 2014-09-20 MED ORDER — IOHEXOL 300 MG/ML  SOLN
100.0000 mL | Freq: Once | INTRAMUSCULAR | Status: AC | PRN
  Administered 2014-09-20: 100 mL via INTRAVENOUS

## 2014-09-20 MED ORDER — ACETAMINOPHEN 10 MG/ML IV SOLN
1000.0000 mg | Freq: Four times a day (QID) | INTRAVENOUS | Status: AC | PRN
  Administered 2014-09-20 – 2014-09-21 (×3): 1000 mg via INTRAVENOUS
  Filled 2014-09-20 (×4): qty 100

## 2014-09-20 MED ORDER — ACETAMINOPHEN 10 MG/ML IV SOLN
1000.0000 mg | Freq: Four times a day (QID) | INTRAVENOUS | Status: DC
  Filled 2014-09-20 (×2): qty 100

## 2014-09-20 MED ORDER — BELLADONNA ALKALOIDS-OPIUM 16.2-60 MG RE SUPP: 1.0000 | Freq: Three times a day (TID) | RECTAL | Status: DC | PRN

## 2014-09-20 MED ORDER — HYDROMORPHONE HCL 1 MG/ML IJ SOLN
1.0000 mg | Freq: Once | INTRAMUSCULAR | Status: AC
  Administered 2014-09-20: 1 mg via INTRAVENOUS
  Filled 2014-09-20: qty 1

## 2014-09-20 MED ORDER — DEXTROSE IN LACTATED RINGERS 5 % IV SOLN
INTRAVENOUS | Status: AC
  Administered 2014-09-20 – 2014-09-23 (×8): via INTRAVENOUS

## 2014-09-20 MED ORDER — BISACODYL 10 MG RE SUPP
10.0000 mg | Freq: Every day | RECTAL | Status: DC | PRN
  Administered 2014-09-28 – 2014-09-29 (×2): 10 mg via RECTAL
  Filled 2014-09-20 (×3): qty 1

## 2014-09-20 MED ORDER — HYDRALAZINE HCL 20 MG/ML IJ SOLN
5.0000 mg | INTRAMUSCULAR | Status: DC | PRN
Start: 2014-09-20 — End: 2014-10-01
  Administered 2014-09-20: 5 mg via INTRAVENOUS
  Filled 2014-09-20: qty 1

## 2014-09-20 MED ORDER — HEPARIN SODIUM (PORCINE) 5000 UNIT/ML IJ SOLN
5000.0000 [IU] | Freq: Three times a day (TID) | INTRAMUSCULAR | Status: DC
Start: 2014-09-20 — End: 2014-10-01
  Administered 2014-09-20 – 2014-09-30 (×29): 5000 [IU] via SUBCUTANEOUS
  Filled 2014-09-20 (×34): qty 1

## 2014-09-20 MED ORDER — SODIUM CHLORIDE 0.9 % IV BOLUS (SEPSIS)
500.0000 mL | Freq: Once | INTRAVENOUS | Status: AC
  Administered 2014-09-20: 500 mL via INTRAVENOUS

## 2014-09-20 MED ORDER — IOHEXOL 300 MG/ML  SOLN: 50.0000 mL | Freq: Once | INTRAMUSCULAR | Status: DC | PRN

## 2014-09-20 MED ORDER — DEXTROSE 5 % IV SOLN
1.0000 g | Freq: Once | INTRAVENOUS | Status: AC
  Administered 2014-09-20: 1 g via INTRAVENOUS
  Filled 2014-09-20: qty 10

## 2014-09-20 NOTE — ED Notes (Signed)
Patient transported to CT 

## 2014-09-20 NOTE — ED Provider Notes (Signed)
PROGRESS NOTE                                                                                                                 This is a sign-out from PA McBainO'Malley at shift change: Jennifer Sanford is a 63 y.o. female presenting with acute onset of diffuse abdominal pain onset last night associated with 2 episodes of nonbloody, nonbilious emesis this a.m. The first episode was at 11 AM this morning and patient states that she vomited yesterday's lunch; which was strange to her. She reports that she was passing flatus but she stopped this afternoon. She's had a series of abdominal surgeries after her ureter was damaged post hysterectomy. Patient is status post ureter reimplantation on 12/22 Dr. Marlou PorchHerrick. Patient has been taking Roxicet at home and has had little pain up until today. States that this is not typical for her baseline bladder spasms. Abdominal exam with diffuse tenderness to palpation, no focal findings. Blood work with no significant abnormalities, no leukocytosis. Patient has Foley in place. Incision is clean dry and intact without signs of infection.   Plan is to follow-up CT scan and discuss results with Dr. Marlou PorchHerrick to try to expedite outpatient care.  Please refer to previous note for full HPI, ROS, PMH and PE.   Patient seen and evaluated the bedside, she states that her pain has returned, severe, 8 out of 10. Patient is afebrile and well-appearing. Heart is regular rate and rhythm with no murmurs rubs or gallops, lung sounds are clear to auscultation bilaterally. Patient has normoactive bowel sounds. She is diffusely tender to palpation with no guarding or rebound. Midline surgical scar with staples in place, clean dry and intact.  Filed Vitals:   09/20/14 1316 09/20/14 1637  BP: 155/89 147/85  Pulse: 66 63  Temp: 98.2 F (36.8 C)   TempSrc: Oral   Resp: 16 18  SpO2: 100% 99%    Medications  iohexol (OMNIPAQUE) 300 MG/ML solution 50 mL (not administered)  HYDROmorphone (DILAUDID)  injection 1 mg (1 mg Intravenous Given 09/20/14 1434)  sodium chloride 0.9 % bolus 500 mL (0 mLs Intravenous Stopped 09/20/14 1525)  ondansetron (ZOFRAN) injection 4 mg (4 mg Intravenous Given 09/20/14 1434)  iohexol (OMNIPAQUE) 300 MG/ML solution 100 mL (100 mLs Intravenous Contrast Given 09/20/14 1544)     Ct Abdomen Pelvis W Contrast  09/20/2014   CLINICAL DATA:  Bladder spasms. Right ureteral reimplantation on 09/13/2014.  EXAM: CT ABDOMEN AND PELVIS WITH CONTRAST  TECHNIQUE: Multidetector CT imaging of the abdomen and pelvis was performed using the standard protocol following bolus administration of intravenous contrast.  CONTRAST:  100mL OMNIPAQUE IOHEXOL 300 MG/ML  SOLN  COMPARISON:  CT scan dated 09/28/2009 and 02/01/2008  FINDINGS: There are multiple dilated loops of mid small bowel. Proximal and distal small bowel are not distended. The colon is not distended. Terminal ileum is normal.  Double pigtail right ureteral stent is in place. The pelvicaliceal system and proximal right ureter are dilated. The distal right ureter  is not distended. There is some excretion of contrast into the dilated right pelvocaliceal system. There is slight dilatation of the biliary tree including the intrahepatic ducts. Common bile duct measures 10 mm in diameter. There is no visible stone or mass in the distal duct. The gallbladder appears normal. No focal lesions in the liver.  Spleen, pancreas, and adrenal glands are normal. No free air or free fluid. Foley catheter in the decompressed bladder.  No acute osseous abnormality. 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 Moderate arthritis of both hips.  IMPRESSION: 1. New dilatation of mid small bowel loops which could represent a focal ileus or partial small bowel obstruction. 2. Persistent dilatation of the proximal right renal collecting system. Double pigtail stent appears in good position on the right. 3. Slight dilatation of the biliary tree.  No elevation of the patient's bilirubin. This is therefore not felt to be significant.   Electronically Signed   By: Geanie CooleyJim  Maxwell M.D.   On: 09/20/2014 16:28     Surgery consult from Dr. Carolynne Edouardoth appreciated: He would recommend consultation urology as this is a postsurgical complication from the recent urologic surgery, their service would be happy to consult with the urology service as needed.  Discussed case with Dr. Marlou PorchHerrick who will admit the patient to manage her on the floor. He will put in holding orders.        Wynetta Emeryicole Manuela Halbur, PA-C 09/20/14 1741  Wynetta EmeryNicole Coty Larsh, PA-C 09/20/14 1749  Suzi RootsKevin E Steinl, MD 09/21/14 [phone]

## 2014-09-20 NOTE — H&P (Signed)
63 year old female who is 7 days status post open right ureteral implant with extensive pelvic lysis of adhesions. She was discharged from the hospital on 09/16/14 at which point she was tolerating a regular diet, albeit smaller meals, without nausea and was having normal and regular bowel movements. Her pain was well-controlled. She presented to the emergency department this evening with 12 hours of intense abdominal cramping and nausea, and emesis. The patient denies any fevers. She denies any bladder spasms or leakage around the catheter. The urine in her catheter is clear. The patient had had normal bowel movements even as early as this morning. However, she has not tolerated any oral intake today. She states that she feels a knot of pain rumbling inside her abdomen. Otherwise, the patient is up and moving around without significant pain. She denies any suprapubic or bladder pain. She denies any neurological changes.  Comprehensive review of systems per history of present illness, otherwise negative.  Past Medical History  Diagnosis Date  . Hypertension   . Depression   . GERD (gastroesophageal reflux disease)   . Complication of anesthesia     VERY CLAUSTROPHIC W/ MASK  . Ureteral stricture, right   . Hydronephrosis, right   . Hyperlipemia   . OA (osteoarthritis)    Past Surgical History  Procedure Laterality Date  . Knee arthroscopy  left  2004//   bilateral 1980  . Breast reduction surgery Bilateral 04-12-2008  . Total knee arthroplasty Bilateral 03/17/2013    Procedure: TOTAL KNEE BILATERAL;  Surgeon: Loanne Drilling, MD;  Location: [facility];  Service: Orthopedics;  Laterality: Bilateral;  . Knee closed reduction Bilateral 06/02/2013    Procedure: CLOSED MANIPULATION BILATERAL KNEES;  Surgeon: Loanne Drilling, MD;  Location: [facility];  Service: Orthopedics;  Laterality: Bilateral;  . Tonsillectomy and adenoidectomy  as child  . Exploratory laparotomy w/ right ureter repair  2000  . Cysto/   right retrograde pyelogram/  balloon dilation right ureter stricture/  stent placement  05-25-2010//   03-14-2009//   01-28-2009  . Cardiovascular stress test  12-26-2009    NORMAL  . Vaginal hysterectomy  2000    w/ right salpingoophorectomy  . Cystoscopy with stent placement Right 02/18/2014    Procedure: CYSTOSCOPY WITH right STENT PLACEMENT;  Surgeon: Danae Chen, MD;  Location: [facility];  Service: Urology;  Laterality: Right;  . Ureteral reimplantion Right 09/13/2014    Procedure: OPEN RIGHT URETERAL REIMPLANT;  Surgeon: Crist Fat, MD;  Location: [facility];  Service: Urology;  Laterality: Right;   No current facility-administered medications on file prior to encounter.   Current Outpatient Prescriptions on File Prior to Encounter  Medication Sig Dispense Refill  . acetaminophen (TYLENOL) 500 MG tablet Take 1,000 mg by mouth every 6 (six) hours as needed for moderate pain (pain).     . citalopram (CELEXA) 20 MG tablet Take 20 mg by mouth every morning.    . fenofibrate 160 MG tablet Take 160 mg by mouth every morning.    Marland Kitchen oxyCODONE-acetaminophen (ROXICET) 5-325 MG per tablet Take 1 tablet by mouth every 4 (four) hours as needed for severe pain. 30 tablet 0  . pantoprazole (PROTONIX) 40 MG tablet Take 40 mg by mouth every morning.     . phentermine (ADIPEX-P) 37.5 MG tablet Take 18.75 mg by mouth daily before breakfast.    . pyridOXINE (VITAMIN B-6) 100 MG tablet Take 100 mg by mouth every morning.    . triamterene-hydrochlorothiazide (MAXZIDE-25)  37.5-25 MG per tablet Take 0.5 tablets by mouth every morning. 30 tablet 1  . trimethoprim (TRIMPEX) 100 MG tablet Take 1 tablet (100 mg total) by mouth 1 day or 1 dose. (Patient taking differently: Take 100 mg by mouth daily. ) 30 tablet 1  . vitamin B-12 (CYANOCOBALAMIN) 1000 MCG tablet Take 1,000 mcg by mouth every morning.     Marland Kitchen. aspirin EC 81 MG tablet Take 81 mg by mouth every morning.     . citalopram (CELEXA) 10  MG tablet Take 1 tablet (10 mg total) by mouth every morning. (Patient not taking: Reported on 09/20/2014) 30 tablet 1  . docusate sodium 100 MG CAPS Take 100 mg by mouth 2 (two) times daily. 60 capsule 0  . oxyCODONE (OXY IR/ROXICODONE) 5 MG immediate release tablet Take 1-2 tablets (5-10 mg total) by mouth every 4 (four) hours as needed for severe pain. 30 tablet 0   PE: Filed Vitals:   09/20/14 1836  BP: 149/84  Pulse: 63  Temp: 98.3 F (36.8 C)  Resp: 20  patient appears in no acute distress, she is alert and oriented, pleasant She has nonlabored breathing No significant peripheral edema, normal pulses Her abdomen is soft, slightly tender to palpation, no rebound, no peritonitis, she has bowel sounds. Her incision is clean without erythema or purulence and is appropriately tender. Her Foley catheter is draining clear yellow urine   Recent Labs  09/20/14 1425  WBC 10.2  HGB 13.0  HCT 38.6    Recent Labs  09/20/14 1425 09/20/14 1910  NA 139 138  K 4.0 3.8  CL 101 103  CO2 28 28  GLUCOSE 97 108*  BUN 16 14  CREATININE 0.98 0.92  CALCIUM 10.0 8.8   No results for input(s): LABPT, INR in the last 72 hours. No results for input(s): LABURIN in the last 72 hours. Results for orders placed or performed during the hospital encounter of 09/09/14  Urine culture     Status: None   Collection Time: 09/09/14  8:37 AM  Result Value Ref Range Status   Specimen Description URINE, CLEAN CATCH  Final   Special Requests NONE  Final   Culture  Setup Time   Final    09/09/2014 10:27 Performed at [facility]    Colony Count   Final    5,000 COLONIES/ML Performed at [facility]    Culture   Final    INSIGNIFICANT GROWTH Performed at [facility]    Report Status 09/10/2014 FINAL  Final    CT scan report: 1. New dilatation of mid small bowel loops which could represent a focal ileus or partial small bowel obstruction. 2. Persistent dilatation of  the proximal right renal collecting system. Double pigtail stent appears in good position on the right. 3. Slight dilatation of the biliary tree. No elevation of the patient's bilirubin. This is therefore not felt to be significant.  Impression: Mrs. Huston FoleyBrady appears to have a small bowel ileus versus partial small bowel obstruction. Her current abdominal exam is reassuring and she has no peritonitis. She has persistent dilation of the right kidney but her stent appears to be in good position. Her renal function is normal and she does not have a significant white blood cell count nor fevers.  Plan: The plan is to admit the patient for observation and bowel rest. We will support her with IV fluids and IV pain and nausea medication. I will obtain a KUB in the morning  to evaluate for any interval change in her gas pattern. In addition, I will ask general surgery to look at her CT scan in the morning.

## 2014-09-20 NOTE — ED Provider Notes (Signed)
CSN: [id number]     Arrival date & time 09/20/14  1257 History   First MD Initiated Contact with Patient 09/20/14 1331     Chief Complaint  Patient presents with  . Spasms     (Consider location/radiation/quality/duration/timing/severity/associated sxs/prior Treatment) HPI Pt is a 63yo female with hx of HTN, depression, GERD, ureteral stricture, hydronephrosis, hyperlipidemia, and OA,  7 days s/o ureter re-implantation by Dr. Marlou PorchHerrick, urology, on 09/13/14, discharged on 09/18/14, presenting to ED with c/o gradually worsening lower abdominal cramping and spasms.   Pt states these spasms do not feel like bladder spasms as she has had those in the past.  Pain is 10/10, unrelieved with Roxicet at home. Pain does not radiate. Denies change in color of urine as pt has foley catheter in place.    Past Medical History  Diagnosis Date  . Hypertension   . Depression   . GERD (gastroesophageal reflux disease)   . Complication of anesthesia     VERY CLAUSTROPHIC W/ MASK  . Ureteral stricture, right   . Hydronephrosis, right   . Hyperlipemia   . OA (osteoarthritis)    Past Surgical History  Procedure Laterality Date  . Knee arthroscopy  left  2004//   bilateral 1980  . Breast reduction surgery Bilateral 04-12-2008  . Total knee arthroplasty Bilateral 03/17/2013    Procedure: TOTAL KNEE BILATERAL;  Surgeon: Loanne DrillingFrank V Aluisio, MD;  Location: [facility];  Service: Orthopedics;  Laterality: Bilateral;  . Knee closed reduction Bilateral 06/02/2013    Procedure: CLOSED MANIPULATION BILATERAL KNEES;  Surgeon: Loanne DrillingFrank V Aluisio, MD;  Location: [facility];  Service: Orthopedics;  Laterality: Bilateral;  . Tonsillectomy and adenoidectomy  as child  . Exploratory laparotomy w/ right ureter repair  2000  . Cysto/  right retrograde pyelogram/  balloon dilation right ureter stricture/  stent placement  05-25-2010//   03-14-2009//   01-28-2009  . Cardiovascular stress test  12-26-2009    NORMAL  . Vaginal hysterectomy   2000    w/ right salpingoophorectomy  . Cystoscopy with stent placement Right 02/18/2014    Procedure: CYSTOSCOPY WITH right STENT PLACEMENT;  Surgeon: Danae ChenMarc H Nesi, MD;  [facility] [place];  Service: Urology;  Laterality: Right;  . Ureteral reimplantion Right 09/13/2014    Procedure: OPEN RIGHT URETERAL REIMPLANT;  Surgeon: Crist FatBenjamin W Herrick, MD;  Location: [facility];  Service: Urology;  Laterality: Right;   Family History  Problem Relation Age of Onset  . Diabetes Mother   . Hypertension Father   . Lung cancer Father   . Cancer Other   . Heart attack Mother   . Dementia Mother   . Heart Problems Brother     CABG  . Bipolar disorder Sister    History  Substance Use Topics  . Smoking status: Never Smoker   . Smokeless tobacco: Never Used  . Alcohol Use: No   OB History    No data available     Review of Systems  Constitutional: Positive for appetite change. Negative for fever and chills.  Respiratory: Negative for cough and shortness of breath.   Cardiovascular: Negative for chest pain and palpitations.  Gastrointestinal: Positive for nausea, vomiting and abdominal pain. Negative for diarrhea and constipation.  Genitourinary:       Foley in place  All other systems reviewed and are negative.     Allergies  Review of patient's allergies indicates no known allergies.  Home Medications   Prior to Admission medications  Medication Sig Start Date End Date Taking? Authorizing Provider  acetaminophen (TYLENOL) 500 MG tablet Take 1,000 mg by mouth every 6 (six) hours as needed for moderate pain (pain).    Yes Historical Provider, MD  citalopram (CELEXA) 20 MG tablet Take 20 mg by mouth every morning.   Yes Historical Provider, MD  fenofibrate 160 MG tablet Take 160 mg by mouth every morning.   Yes Historical Provider, MD  oxyCODONE-acetaminophen (ROXICET) 5-325 MG per tablet Take 1 tablet by mouth every 4 (four) hours as needed for severe pain. 09/16/14  Yes  Kathi Ludwig, MD  pantoprazole (PROTONIX) 40 MG tablet Take 40 mg by mouth every morning.    Yes Historical Provider, MD  phentermine (ADIPEX-P) 37.5 MG tablet Take 18.75 mg by mouth daily before breakfast.   Yes Historical Provider, MD  pyridOXINE (VITAMIN B-6) 100 MG tablet Take 100 mg by mouth every morning.   Yes Historical Provider, MD  triamterene-hydrochlorothiazide (MAXZIDE-25) 37.5-25 MG per tablet Take 0.5 tablets by mouth every morning. 04/02/13  Yes Daniel J Angiulli, PA-C  trimethoprim (TRIMPEX) 100 MG tablet Take 1 tablet (100 mg total) by mouth 1 day or 1 dose. Patient taking differently: Take 100 mg by mouth daily.  09/16/14  Yes Sigmund Bridget Hartshorn, MD  vitamin B-12 (CYANOCOBALAMIN) 1000 MCG tablet Take 1,000 mcg by mouth every morning.    Yes Historical Provider, MD  aspirin EC 81 MG tablet Take 81 mg by mouth every morning.     Historical Provider, MD  citalopram (CELEXA) 10 MG tablet Take 1 tablet (10 mg total) by mouth every morning. Patient not taking: Reported on 09/20/2014 04/02/13   Mcarthur Rossetti Angiulli, PA-C  docusate sodium 100 MG CAPS Take 100 mg by mouth 2 (two) times daily. 09/14/14   Crist Fat, MD  oxyCODONE (OXY IR/ROXICODONE) 5 MG immediate release tablet Take 1-2 tablets (5-10 mg total) by mouth every 4 (four) hours as needed for severe pain. 09/14/14   Crist Fat, MD   BP 97/63 mmHg  Pulse 66  Temp(Src) 98.9 F (37.2 C) (Oral)  Resp 20  Ht 5\' 10"  (1.778 m)  Wt 202 lb (91.627 kg)  BMI 28.98 kg/m2  SpO2 100% Physical Exam  Constitutional: She appears well-developed and well-nourished. She appears distressed.  Pt lying in exam bed, holding her abdomen, appears uncomfortable.  HENT:  Head: Normocephalic and atraumatic.  Eyes: Conjunctivae are normal. No scleral icterus.  Neck: Normal range of motion.  Cardiovascular: Normal rate, regular rhythm and normal heart sounds.   Pulmonary/Chest: Effort normal and breath sounds normal. No  respiratory distress. She has no wheezes. She has no rales. She exhibits no tenderness.  Abdominal: Soft. Bowel sounds are normal. She exhibits distension. She exhibits no mass. There is tenderness. There is no rebound and no guarding.  Mildly distended abdomen, soft, diffuse tenderness with guarding.  Surgical incision with staples appears to be healing well, no signs of underlying infection at surgical site.  Musculoskeletal: Normal range of motion.  Neurological: She is alert.  Skin: Skin is warm and dry. She is not diaphoretic.  Nursing note and vitals reviewed.   ED Course  Procedures (including critical care time) Labs Review Labs Reviewed  CBC WITH DIFFERENTIAL - Abnormal; Notable for the following:    Platelets 427 (*)    All other components within normal limits  COMPREHENSIVE METABOLIC PANEL - Abnormal; Notable for the following:    Total Protein 8.4 (*)    GFR  calc non Af Amer 61 (*)    GFR calc Af Amer 71 (*)    All other components within normal limits  URINALYSIS, ROUTINE W REFLEX MICROSCOPIC - Abnormal; Notable for the following:    pH 8.5 (*)    Hgb urine dipstick LARGE (*)    Protein, ur 100 (*)    Leukocytes, UA SMALL (*)    All other components within normal limits  URINE MICROSCOPIC-ADD ON - Abnormal; Notable for the following:    Casts HYALINE CASTS (*)    All other components within normal limits  BASIC METABOLIC PANEL - Abnormal; Notable for the following:    Glucose, Bld 108 (*)    GFR calc non Af Amer 66 (*)    GFR calc Af Amer 76 (*)    All other components within normal limits  BASIC METABOLIC PANEL - Abnormal; Notable for the following:    Glucose, Bld 107 (*)    GFR calc non Af Amer 69 (*)    GFR calc Af Amer 79 (*)    All other components within normal limits  CBC - Abnormal; Notable for the following:    RBC 3.37 (*)    Hemoglobin 10.2 (*)    HCT 29.7 (*)    All other components within normal limits  URINE CULTURE  LIPASE, BLOOD     Imaging Review Ct Abdomen Pelvis W Contrast  09/20/2014   CLINICAL DATA:  Bladder spasms. Right ureteral reimplantation on 09/13/2014.  EXAM: CT ABDOMEN AND PELVIS WITH CONTRAST  TECHNIQUE: Multidetector CT imaging of the abdomen and pelvis was performed using the standard protocol following bolus administration of intravenous contrast.  CONTRAST:  OMNIPAQUE IOHEXOL 300 MG/ML  SOLN  COMPARISON:  CT scan dated 09/28/2009 and 02/01/2008  FINDINGS: There are multiple dilated loops of mid small bowel. Proximal and distal small bowel are not distended. The colon is not distended. Terminal ileum is normal.  Double pigtail right ureteral stent is in place. The pelvicaliceal system and proximal right ureter are dilated. The distal right ureter is not distended. There is some excretion of contrast into the dilated right pelvocaliceal system. There is slight dilatation of the biliary tree including the intrahepatic ducts. Common bile duct measures 10 mm in diameter. There is no visible stone or mass in the distal duct. The gallbladder appears normal. No focal lesions in the liver.  Spleen, pancreas, and adrenal glands are normal. No free air or free fluid. Foley catheter in the decompressed bladder.  No acute osseous abnormality. 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 Moderate arthritis of both hips.  IMPRESSION: 1. New dilatation of mid small bowel loops which could represent a focal ileus or partial small bowel obstruction. 2. Persistent dilatation of the proximal right renal collecting system. Double pigtail stent appears in good position on the right. 3. Slight dilatation of the biliary tree. No elevation of the patient's bilirubin. This is therefore not felt to be significant.   Electronically Signed   By: Geanie Cooley M.D.   On: 09/20/2014 16:28     EKG Interpretation None      MDM   Final diagnoses:  Small bowel obstruction, partial    Pt is a 63yo female c/o  abdominal pain that started this morning, gradually worsening. Pt is 7 days s/p ureter re-implantation by Dr. Marlou Porch on 09/13/14.  Afebrile, denies change in color of urine in foley bag. Surgical incision appears to be healing well, however, mild  abdominal distension with diffuse abdominal tenderness and guarding on exam.  Concern for post-op complication. Labs: unremarkable. CT abd: significant for new dilation of mid-small bowel loops which could represent a focal ileus or partial SBO. Pt signed out to [employer] Pisciotta, PA-C. Plan to consult with general surgery to help determine disposition.     Junius FinnerErin O'Malley, PA-C 09/21/14 1356  Suzi RootsKevin E Steinl, MD 09/21/14 [phone]

## 2014-09-20 NOTE — ED Notes (Signed)
Per pt report: pt had a ureter re-implantation on 12/22 and was discharged home on Christmas.  Pt was fine but last night pt developed spasms that have become progressively worse.  Pt reports pain in the upper part of the bladder rather than the lower part.  Pt endorses N/V. Pt a/o x 4. Skin warm and dry.

## 2014-09-21 ENCOUNTER — Observation Stay (HOSPITAL_COMMUNITY): Payer: 59

## 2014-09-21 DIAGNOSIS — Z7982 Long term (current) use of aspirin: Secondary | ICD-10-CM | POA: Diagnosis not present

## 2014-09-21 DIAGNOSIS — E785 Hyperlipidemia, unspecified: Secondary | ICD-10-CM | POA: Diagnosis present

## 2014-09-21 DIAGNOSIS — K219 Gastro-esophageal reflux disease without esophagitis: Secondary | ICD-10-CM | POA: Diagnosis present

## 2014-09-21 DIAGNOSIS — R109 Unspecified abdominal pain: Secondary | ICD-10-CM | POA: Diagnosis present

## 2014-09-21 DIAGNOSIS — F329 Major depressive disorder, single episode, unspecified: Secondary | ICD-10-CM | POA: Diagnosis present

## 2014-09-21 DIAGNOSIS — Z833 Family history of diabetes mellitus: Secondary | ICD-10-CM | POA: Diagnosis not present

## 2014-09-21 DIAGNOSIS — Z8249 Family history of ischemic heart disease and other diseases of the circulatory system: Secondary | ICD-10-CM | POA: Diagnosis not present

## 2014-09-21 DIAGNOSIS — R319 Hematuria, unspecified: Secondary | ICD-10-CM | POA: Diagnosis not present

## 2014-09-21 DIAGNOSIS — Y838 Other surgical procedures as the cause of abnormal reaction of the patient, or of later complication, without mention of misadventure at the time of the procedure: Secondary | ICD-10-CM | POA: Diagnosis present

## 2014-09-21 DIAGNOSIS — Z79899 Other long term (current) drug therapy: Secondary | ICD-10-CM | POA: Diagnosis not present

## 2014-09-21 DIAGNOSIS — Z801 Family history of malignant neoplasm of trachea, bronchus and lung: Secondary | ICD-10-CM | POA: Diagnosis not present

## 2014-09-21 DIAGNOSIS — Z79891 Long term (current) use of opiate analgesic: Secondary | ICD-10-CM | POA: Diagnosis not present

## 2014-09-21 DIAGNOSIS — K913 Postprocedural intestinal obstruction: Secondary | ICD-10-CM | POA: Diagnosis present

## 2014-09-21 DIAGNOSIS — I10 Essential (primary) hypertension: Secondary | ICD-10-CM | POA: Diagnosis present

## 2014-09-21 DIAGNOSIS — Z90710 Acquired absence of both cervix and uterus: Secondary | ICD-10-CM | POA: Diagnosis not present

## 2014-09-21 DIAGNOSIS — Z96653 Presence of artificial knee joint, bilateral: Secondary | ICD-10-CM | POA: Diagnosis present

## 2014-09-21 DIAGNOSIS — M1990 Unspecified osteoarthritis, unspecified site: Secondary | ICD-10-CM | POA: Diagnosis present

## 2014-09-21 LAB — BASIC METABOLIC PANEL
Anion gap: 5 (ref 5–15)
BUN: 12 mg/dL (ref 6–23)
CO2: 28 mmol/L (ref 19–32)
Calcium: 8.8 mg/dL (ref 8.4–10.5)
Chloride: 104 mEq/L (ref 96–112)
Creatinine, Ser: 0.89 mg/dL (ref 0.50–1.10)
GFR calc Af Amer: 79 mL/min — ABNORMAL LOW (ref >90)
GFR calc non Af Amer: 69 mL/min — ABNORMAL LOW (ref >90)
Glucose, Bld: 107 mg/dL — ABNORMAL HIGH (ref 70–99)
Potassium: 3.6 mmol/L (ref 3.5–5.1)
Sodium: 137 mmol/L (ref 135–145)

## 2014-09-21 LAB — CBC
HCT: 29.7 % — ABNORMAL LOW (ref 36.0–46.0)
Hemoglobin: 10.2 g/dL — ABNORMAL LOW (ref 12.0–15.0)
MCH: 30.3 pg (ref 26.0–34.0)
MCHC: 34.3 g/dL (ref 30.0–36.0)
MCV: 88.1 fL (ref 78.0–100.0)
Platelets: 338 10*3/uL (ref 150–400)
RBC: 3.37 MIL/uL — ABNORMAL LOW (ref 3.87–5.11)
RDW: 13.8 % (ref 11.5–15.5)
WBC: 7.1 10*3/uL (ref 4.0–10.5)

## 2014-09-21 MED ORDER — TRIAMTERENE-HCTZ 37.5-25 MG PO TABS
1.0000 | ORAL_TABLET | Freq: Every day | ORAL | Status: DC
  Filled 2014-09-21 (×12): qty 1

## 2014-09-21 MED ORDER — ACETAMINOPHEN 10 MG/ML IV SOLN
1000.0000 mg | Freq: Four times a day (QID) | INTRAVENOUS | Status: AC | PRN
  Filled 2014-09-21: qty 100

## 2014-09-21 MED ORDER — PANTOPRAZOLE SODIUM 40 MG IV SOLR
40.0000 mg | Freq: Every day | INTRAVENOUS | Status: DC
  Administered 2014-09-21 – 2014-09-30 (×10): 40 mg via INTRAVENOUS
  Filled 2014-09-21 (×12): qty 40

## 2014-09-21 NOTE — Consult Note (Signed)
Reason for Consult:vomiting Referring Physician: Dr. Chalmers Cater Jennifer Sanford is an 63 y.o. female.  HPI: The pt is a 63yo black female who is 8 days s/p ureteral reimplantation surgery. She was at home doing well and began developing crampy central abdominal pain on Sunday night and Monday. This was associated with nausea and vomiting. She did have a normal bm on Tuesday. She came to ER and CT shows evidence of at least a partial sbo. She apparently also had an extensive lysis of adhesions during her surgery  Past Medical History  Diagnosis Date  . Hypertension   . Depression   . GERD (gastroesophageal reflux disease)   . Complication of anesthesia     VERY CLAUSTROPHIC W/ MASK  . Ureteral stricture, right   . Hydronephrosis, right   . Hyperlipemia   . OA (osteoarthritis)     Past Surgical History  Procedure Laterality Date  . Knee arthroscopy  left  2004//   bilateral 1980  . Breast reduction surgery Bilateral 04-12-2008  . Total knee arthroplasty Bilateral 03/17/2013    Procedure: TOTAL KNEE BILATERAL;  Surgeon: Gearlean Alf, MD;  Location: [facility];  Service: Orthopedics;  Laterality: Bilateral;  . Knee closed reduction Bilateral 06/02/2013    Procedure: CLOSED MANIPULATION BILATERAL KNEES;  Surgeon: Gearlean Alf, MD;  Location: [facility];  Service: Orthopedics;  Laterality: Bilateral;  . Tonsillectomy and adenoidectomy  as child  . Exploratory laparotomy w/ right ureter repair  2000  . Cysto/  right retrograde pyelogram/  balloon dilation right ureter stricture/  stent placement  05-25-2010//   03-14-2009//   01-28-2009  . Cardiovascular stress test  12-26-2009    NORMAL  . Vaginal hysterectomy  2000    w/ right salpingoophorectomy  . Cystoscopy with stent placement Right 02/18/2014    Procedure: CYSTOSCOPY WITH right STENT PLACEMENT;  Surgeon: Arvil Persons, MD;  Location: [facility];  Service: Urology;  Laterality: Right;  . Ureteral reimplantion Right 09/13/2014     Procedure: OPEN RIGHT URETERAL REIMPLANT;  Surgeon: Ardis Hughs, MD;  Location: [facility];  Service: Urology;  Laterality: Right;    Family History  Problem Relation Age of Onset  . Diabetes Mother   . Hypertension Father   . Lung cancer Father   . Cancer Other   . Heart attack Mother   . Dementia Mother   . Heart Problems Brother     CABG  . Bipolar disorder Sister     Social History:  reports that she has never smoked. She has never used smokeless tobacco. She reports that she does not drink alcohol or use illicit drugs.  Allergies: No Known Allergies  Medications: I have reviewed the patient's current medications.  Results for orders placed or performed during the hospital encounter of 09/20/14 (from the past 48 hour(s))  CBC with Differential     Status: Abnormal   Collection Time: 09/20/14  2:25 PM  Result Value Ref Range   WBC 10.2 4.0 - 10.5 K/uL   RBC 4.40 3.87 - 5.11 MIL/uL   Hemoglobin 13.0 12.0 - 15.0 g/dL   HCT 38.6 36.0 - 46.0 %   MCV 87.7 78.0 - 100.0 fL   MCH 29.5 26.0 - 34.0 pg   MCHC 33.7 30.0 - 36.0 g/dL   RDW 13.8 11.5 - 15.5 %   Platelets 427 (H) 150 - 400 K/uL   Neutrophils Relative % 59 43 - 77 %   Neutro Abs 6.0  1.7 - 7.7 K/uL   Lymphocytes Relative 32 12 - 46 %   Lymphs Abs 3.3 0.7 - 4.0 K/uL   Monocytes Relative 7 3 - 12 %   Monocytes Absolute 0.7 0.1 - 1.0 K/uL   Eosinophils Relative 2 0 - 5 %   Eosinophils Absolute 0.2 0.0 - 0.7 K/uL   Basophils Relative 0 0 - 1 %   Basophils Absolute 0.0 0.0 - 0.1 K/uL  Comprehensive metabolic panel     Status: Abnormal   Collection Time: 09/20/14  2:25 PM  Result Value Ref Range   Sodium 139 135 - 145 mmol/L    Comment: Please note change in reference range.   Potassium 4.0 3.5 - 5.1 mmol/L    Comment: Please note change in reference range.   Chloride 101 96 - 112 mEq/L   CO2 28 19 - 32 mmol/L   Glucose, Bld 97 70 - 99 mg/dL   BUN 16 6 - 23 mg/dL   Creatinine, Ser 0.98 0.50 - 1.10 mg/dL    Calcium 10.0 8.4 - 10.5 mg/dL   Total Protein 8.4 (H) 6.0 - 8.3 g/dL   Albumin 4.0 3.5 - 5.2 g/dL   AST 37 0 - 37 U/L   ALT 22 0 - 35 U/L   Alkaline Phosphatase 72 39 - 117 U/L   Total Bilirubin 0.3 0.3 - 1.2 mg/dL   GFR calc non Af Amer 61 (L) >90 mL/min   GFR calc Af Amer 71 (L) >90 mL/min    Comment: (NOTE) The eGFR has been calculated using the CKD EPI equation. This calculation has not been validated in all clinical situations. eGFR's persistently <90 mL/min signify possible Chronic Kidney Disease.    Anion gap 10 5 - 15  Lipase, blood     Status: None   Collection Time: 09/20/14  2:25 PM  Result Value Ref Range   Lipase 21 11 - 59 U/L  Urinalysis, Routine w reflex microscopic     Status: Abnormal   Collection Time: 09/20/14  2:43 PM  Result Value Ref Range   Color, Urine YELLOW YELLOW   APPearance CLEAR CLEAR   Specific Gravity, Urine 1.019 1.005 - 1.030   pH 8.5 (H) 5.0 - 8.0   Glucose, UA NEGATIVE NEGATIVE mg/dL   Hgb urine dipstick LARGE (A) NEGATIVE   Bilirubin Urine NEGATIVE NEGATIVE   Ketones, ur NEGATIVE NEGATIVE mg/dL   Protein, ur 100 (A) NEGATIVE mg/dL   Urobilinogen, UA 0.2 0.0 - 1.0 mg/dL   Nitrite NEGATIVE NEGATIVE   Leukocytes, UA SMALL (A) NEGATIVE  Urine microscopic-add on     Status: Abnormal   Collection Time: 09/20/14  2:43 PM  Result Value Ref Range   Squamous Epithelial / LPF RARE RARE   WBC, UA 21-50 <3 WBC/hpf   RBC / HPF 3-6 <3 RBC/hpf   Bacteria, UA RARE RARE   Casts HYALINE CASTS (A) NEGATIVE  Basic metabolic panel     Status: Abnormal   Collection Time: 09/20/14  7:10 PM  Result Value Ref Range   Sodium 138 135 - 145 mmol/L    Comment: Please note change in reference range.   Potassium 3.8 3.5 - 5.1 mmol/L    Comment: Please note change in reference range.   Chloride 103 96 - 112 mEq/L   CO2 28 19 - 32 mmol/L   Glucose, Bld 108 (H) 70 - 99 mg/dL   BUN 14 6 - 23 mg/dL   Creatinine, Ser 0.92 0.50 -  1.10 mg/dL   Calcium 8.8 8.4 -  10.5 mg/dL   GFR calc non Af Amer 66 (L) >90 mL/min   GFR calc Af Amer 76 (L) >90 mL/min    Comment: (NOTE) The eGFR has been calculated using the CKD EPI equation. This calculation has not been validated in all clinical situations. eGFR's persistently <90 mL/min signify possible Chronic Kidney Disease.    Anion gap 7 5 - 15    Ct Abdomen Pelvis W Contrast  09/20/2014   CLINICAL DATA:  Bladder spasms. Right ureteral reimplantation on 09/13/2014.  EXAM: CT ABDOMEN AND PELVIS WITH CONTRAST  TECHNIQUE: Multidetector CT imaging of the abdomen and pelvis was performed using the standard protocol following bolus administration of intravenous contrast.  CONTRAST:  146mL OMNIPAQUE IOHEXOL 300 MG/ML  SOLN  COMPARISON:  CT scan dated 09/28/2009 and 02/01/2008  FINDINGS: There are multiple dilated loops of mid small bowel. Proximal and distal small bowel are not distended. The colon is not distended. Terminal ileum is normal.  Double pigtail right ureteral stent is in place. The pelvicaliceal system and proximal right ureter are dilated. The distal right ureter is not distended. There is some excretion of contrast into the dilated right pelvocaliceal system. There is slight dilatation of the biliary tree including the intrahepatic ducts. Common bile duct measures 10 mm in diameter. There is no visible stone or mass in the distal duct. The gallbladder appears normal. No focal lesions in the liver.  Spleen, pancreas, and adrenal glands are normal. No free air or free fluid. Foley catheter in the decompressed bladder.  No acute osseous abnormality. 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 Moderate arthritis of both hips.  IMPRESSION: 1. New dilatation of mid small bowel loops which could represent a focal ileus or partial small bowel obstruction. 2. Persistent dilatation of the proximal right renal collecting system. Double pigtail stent appears in good position on the right. 3. Slight  dilatation of the biliary tree. No elevation of the patient's bilirubin. This is therefore not felt to be significant.   Electronically Signed   By: Rozetta Nunnery M.D.   On: 09/20/2014 16:28    Review of Systems  Constitutional: Negative.   HENT: Negative.   Eyes: Negative.   Respiratory: Negative.   Cardiovascular: Negative.   Gastrointestinal: Positive for nausea, vomiting and abdominal pain.  Genitourinary: Negative.   Musculoskeletal: Negative.   Skin: Negative.   Neurological: Negative.   Endo/Heme/Allergies: Negative.   Psychiatric/Behavioral: Negative.    Blood pressure 126/78, pulse 61, temperature 98.4 F (36.9 C), temperature source Oral, resp. rate 18, height $RemoveBe'5\' 10"'[id number]$  (1.778 m), weight 202 lb (91.627 kg), SpO2 100 %. Physical Exam  Constitutional: She is oriented to person, place, and time. She appears well-developed and well-nourished.  HENT:  Head: Normocephalic and atraumatic.  Eyes: Conjunctivae and EOM are normal. Pupils are equal, round, and reactive to light.  Neck: Normal range of motion. Neck supple.  Cardiovascular: Normal rate, regular rhythm and normal heart sounds.   Respiratory: Effort normal and breath sounds normal.  GI: Soft. Bowel sounds are normal.  There is tenderness of abdomen centrally and on right. No guarding. Lower midline incision looks good with staples intact  Musculoskeletal: Normal range of motion.  Neurological: She is alert and oriented to person, place, and time.  Skin: Skin is warm and dry.  Psychiatric: She has a normal mood and affect. Her behavior is normal.    Assessment/Plan: The pt appears to  have an sbo most likely related to adhesions from her recent surgery. At this point she appears to be tolerating this well. I would agree with bowel rest. I would recheck abdominal xrays in AM. If she vomits again then I would recommend placing an ng. She is reaching a difficult time point postoperatively if she does not open up. We will follow  her closely with you  TOTH III,Tayton Decaire S 09/21/2014, 2:58 AM

## 2014-09-21 NOTE — Progress Notes (Signed)
Paged MD with patient concerns about pain. Pt states that the Tylenol IV works great for her pain and she is worried about taking the IV dilaudid for fear it will cause her SBO to become worse. Pt is asking for the MD to reorder the tylenol IV while she remains in the hospital. Will continue to monitor patient.

## 2014-09-21 NOTE — Progress Notes (Signed)
Came to visit patient at bedside on behalf of [employer] program for [employer] employees/dependents with [employer]. Will continue to follow and offer support as needed. Raiford NobleAtika Michaelangelo Mittelman, MSN- RN,BSN- [facility] Liaison-[phone]

## 2014-09-21 NOTE — Progress Notes (Signed)
Admitted on 12/29 with pSBO, s/p open right ureteral reimplant on 09/13/14 with extensive pelvic lysis of adhesions.  Intv: No issues overnight KUB this AM without air/fluid levels Pain reasonably well controlled Persistent nausea  PE Filed Vitals:   09/20/14 1637 09/20/14 1836 09/20/14 2048 09/20/14 2244  BP: 147/85 149/84 150/90 126/78  Pulse: 63 63 60 61  Temp:  98.3 F (36.8 C) 98.4 F (36.9 C)   TempSrc:  Oral Oral   Resp: 18 20 18    Height:  5\' 10"  (1.778 m)    Weight:  91.627 kg (202 lb)    SpO2: 99% 100% 100%     Intake/Output Summary (Last 24 hours) at 09/21/14 0745 Last data filed at 09/20/14 2300  Gross per 24 hour  Intake      0 ml  Output    900 ml  Net   -900 ml   NAD Abdomen is soft Epigastric/upper abdominal tenderness Incision c/d/i Foley draining clear yellow urine Extremities symmetric   Recent Labs  09/20/14 1425 09/21/14 0407  WBC 10.2 7.1  HGB 13.0 10.2*  HCT 38.6 29.7*    Recent Labs  09/20/14 1425 09/20/14 1910 09/21/14 0407  NA 139 138 137  K 4.0 3.8 3.6  CL 101 103 104  CO2 28 28 28   GLUCOSE 97 108* 107*  BUN 16 14 12   CREATININE 0.98 0.92 0.89  CALCIUM 10.0 8.8 8.8   No results for input(s): LABPT, INR in the last 72 hours. No results for input(s): LABURIN in the last 72 hours. Results for orders placed or performed during the hospital encounter of 09/09/14  Urine culture     Status: None   Collection Time: 09/09/14  8:37 AM  Result Value Ref Range Status   Specimen Description URINE, CLEAN CATCH  Final   Special Requests NONE  Final   Culture  Setup Time   Final    09/09/2014 10:27 Performed at [facility]    Colony Count   Final    5,000 COLONIES/ML Performed at [facility]    Culture   Final    INSIGNIFICANT GROWTH Performed at [facility]    Report Status 09/10/2014 FINAL  Final     KUB - normal appearing gas pattern - contrast appears to have made it through SB and is now in  colon.  Imp- pSBO from post-surgical adhesions Plan -  Bowel rest IV medications Serial KUB and BMP Low threshold for NG Appreciate GS input.

## 2014-09-21 NOTE — Progress Notes (Signed)
UR completed 

## 2014-09-21 NOTE — Progress Notes (Signed)
Subjective: Some nausea, no vomiting, no flatus, wound OK, few BS.  Objective: Vital signs in last 24 hours: Temp:  [98.2 F (36.8 C)-98.4 F (36.9 C)] 98.4 F (36.9 C) (12/29 2048) Pulse Rate:  [60-66] 61 (12/29 2244) Resp:  [16-20] 18 (12/29 2048) BP: (126-155)/(78-90) 126/78 mmHg (12/29 2244) SpO2:  [99 %-100 %] 100 % (12/29 2048) Weight:  [91.627 kg (202 lb)] 91.627 kg (202 lb) (12/29 1836) Last BM Date: 09/20/14 900 urine recorded this AM Afebrile, VSS Labs OK this AM Film shows contrast in the right and transverse colon, with some SB loops still distended. Intake/Output from previous day: 12/29 0701 - 12/30 0700 In: -  Out: 900 [Urine:900] Intake/Output this shift:    General appearance: alert, cooperative and no distress GI: soft, still distended, few BS, no flatus or BM.  Lab Results:   Recent Labs  09/20/14 1425 09/21/14 0407  WBC 10.2 7.1  HGB 13.0 10.2*  HCT 38.6 29.7*  PLT 427* 338    BMET  Recent Labs  09/20/14 1910 09/21/14 0407  NA 138 137  K 3.8 3.6  CL 103 104  CO2 28 28  GLUCOSE 108* 107*  BUN 14 12  CREATININE 0.92 0.89  CALCIUM 8.8 8.8   PT/INR No results for input(s): LABPROT, INR in the last 72 hours.   Recent Labs Lab 09/20/14 1425  AST 37  ALT 22  ALKPHOS 72  BILITOT 0.3  PROT 8.4*  ALBUMIN 4.0     Lipase     Component Value Date/Time   LIPASE 21 09/20/2014 1425     Studies/Results: Dg Abd 1 View  09/21/2014   CLINICAL DATA:  Small-bowel obstruction.  EXAM: ABDOMEN - 1 VIEW  COMPARISON:  CT 09/20/2014.  FINDINGS: Soft tissues unremarkable. Right ureteral stent noted in good anatomic position. Surgical staples noted over the pelvis. Persistent dilated loops of small bowel noted. Oral contrast from prior CT noted in the colon. No free air. No acute bony abnormality.  IMPRESSION: 1. Persistent dilated loops of small bowel. Oral contrast from prior CT is in the colon. Partial small bowel obstruction and/or  adynamic ileus could present in this fashion. 2. Right ureteral double-J stent in good anatomic position.   Electronically Signed   By: Maisie Fushomas  Register   On: 09/21/2014 07:55   Ct Abdomen Pelvis W Contrast  09/20/2014   CLINICAL DATA:  Bladder spasms. Right ureteral reimplantation on 09/13/2014.  EXAM: CT ABDOMEN AND PELVIS WITH CONTRAST  TECHNIQUE: Multidetector CT imaging of the abdomen and pelvis was performed using the standard protocol following bolus administration of intravenous contrast.  CONTRAST:  100mL OMNIPAQUE IOHEXOL 300 MG/ML  SOLN  COMPARISON:  CT scan dated 09/28/2009 and 02/01/2008  FINDINGS: There are multiple dilated loops of mid small bowel. Proximal and distal small bowel are not distended. The colon is not distended. Terminal ileum is normal.  Double pigtail right ureteral stent is in place. The pelvicaliceal system and proximal right ureter are dilated. The distal right ureter is not distended. There is some excretion of contrast into the dilated right pelvocaliceal system. There is slight dilatation of the biliary tree including the intrahepatic ducts. Common bile duct measures 10 mm in diameter. There is no visible stone or mass in the distal duct. The gallbladder appears normal. No focal lesions in the liver.  Spleen, pancreas, and adrenal glands are normal. No free air or free fluid. Foley catheter in the decompressed bladder.  No acute osseous abnormality. Degenerative disc  and joint disease in the lower lumbar spine. Moderate arthritis of both hips.  IMPRESSION: 1. New dilatation of mid small bowel loops which could represent a focal ileus or partial small bowel obstruction. 2. Persistent dilatation of the proximal right renal collecting system. Double pigtail stent appears in good position on the right. 3. Slight dilatation of the biliary tree. No elevation of the patient's bilirubin. This is therefore not felt to be significant.   Electronically Signed   By: Geanie CooleyJim  Maxwell M.D.    On: 09/20/2014 16:28    Medications: . heparin subcutaneous  5,000 Units Subcutaneous 3 times per day  . pantoprazole (PROTONIX) IV  40 mg Intravenous Daily  . triamterene-hydrochlorothiazide  1 tablet Oral Daily    Assessment/Plan SBO Right distal ureteral stricture, S/p  OPEN RIGHT URETERAL REIMPLANT; Surgeon: Crist FatBenjamin W Herrick, MD, 09/13/14. Hypertension GERD DEpression Osteoarthritis Hyperlipidemia  Plan:  Continue bowel rest, hydration, OK right now with just some nausea, no vomiting.  Film show contrast moving into the colon so maybe this will resolve with time.  Recheck film in AM.     LOS: 1 day    Satori Krabill 09/21/2014

## 2014-09-22 ENCOUNTER — Inpatient Hospital Stay (HOSPITAL_COMMUNITY): Payer: 59

## 2014-09-22 LAB — BASIC METABOLIC PANEL
Anion gap: 8 (ref 5–15)
BUN: 9 mg/dL (ref 6–23)
CO2: 27 mmol/L (ref 19–32)
Calcium: 8.7 mg/dL (ref 8.4–10.5)
Chloride: 104 mEq/L (ref 96–112)
Creatinine, Ser: 1.01 mg/dL (ref 0.50–1.10)
GFR calc Af Amer: 68 mL/min — ABNORMAL LOW (ref >90)
GFR calc non Af Amer: 59 mL/min — ABNORMAL LOW (ref >90)
Glucose, Bld: 95 mg/dL (ref 70–99)
Potassium: 3.3 mmol/L — ABNORMAL LOW (ref 3.5–5.1)
Sodium: 139 mmol/L (ref 135–145)

## 2014-09-22 MED ORDER — POTASSIUM CHLORIDE 10 MEQ/100ML IV SOLN
10.0000 meq | INTRAVENOUS | Status: AC
  Administered 2014-09-22 (×6): 10 meq via INTRAVENOUS
  Filled 2014-09-22 (×7): qty 100

## 2014-09-22 MED ORDER — KETOROLAC TROMETHAMINE 30 MG/ML IJ SOLN
15.0000 mg | Freq: Four times a day (QID) | INTRAMUSCULAR | Status: DC | PRN
  Administered 2014-09-22 – 2014-09-25 (×2): 15 mg via INTRAVENOUS
  Filled 2014-09-22 (×2): qty 1

## 2014-09-22 MED ORDER — SODIUM CHLORIDE 0.9 % IJ SOLN
10.0000 mL | INTRAMUSCULAR | Status: DC | PRN
  Administered 2014-09-23 – 2014-09-29 (×2): 10 mL
  Filled 2014-09-22 (×2): qty 40

## 2014-09-22 NOTE — Progress Notes (Signed)
PARENTERAL NUTRITION CONSULT NOTE - INITIAL  Pharmacy Consult for TPN Indication: Prolonged ileus  No Known Allergies  Patient Measurements: Height: 5\' 10"  (177.8 cm) Weight: 202 lb (91.627 kg) IBW/kg (Calculated) : 68.5 Adjusted Body Weight:  Usual Weight:   Vital Signs: Temp: 98 F (36.7 C) (12/31 1404) Temp Source: Oral (12/31 1404) BP: 123/78 mmHg (12/31 1404) Pulse Rate: 61 (12/31 1404) Intake/Output from previous day: 12/30 0701 - 12/31 0700 In: 2175 [I.V.:1875; IV Piggyback:300] Out: 1750 [Urine:1750] Intake/Output from this shift:    Labs:  Recent Labs  09/20/14 1425 09/21/14 0407  WBC 10.2 7.1  HGB 13.0 10.2*  HCT 38.6 29.7*  PLT 427* 338     Recent Labs  09/20/14 1425 09/20/14 1910 09/21/14 0407 09/22/14 0355  NA 139 138 137 139  K 4.0 3.8 3.6 3.3*  CL 101 103 104 104  CO2 28 28 28 27   GLUCOSE 97 108* 107* 95  BUN 16 14 12 9   CREATININE 0.98 0.92 0.89 1.01  CALCIUM 10.0 8.8 8.8 8.7  PROT 8.4*  --   --   --   ALBUMIN 4.0  --   --   --   AST 37  --   --   --   ALT 22  --   --   --   ALKPHOS 72  --   --   --   BILITOT 0.3  --   --   --    Estimated Creatinine Clearance: 71.7 mL/min (by C-G formula based on Cr of 1.01).   No results for input(s): GLUCAP in the last 72 hours.  Medical History: Past Medical History  Diagnosis Date  . Hypertension   . Depression   . GERD (gastroesophageal reflux disease)   . Complication of anesthesia     VERY CLAUSTROPHIC W/ MASK  . Ureteral stricture, right   . Hydronephrosis, right   . Hyperlipemia   . OA (osteoarthritis)     Insulin Requirements: No insulin required yet, No hx DM  Current Nutrition: NPO  IVF: D5LR @ 125 ml/hr  Central access: R brachial PICC 12/31 TPN start date: 09/23/14 - tentative  ASSESSMENT                                                                                                          HPI: [id number] yoF admitted on 12/29 with pSBO, s/p open right ureteral reimplant  on 09/13/14 with extensive pelvic lysis of adhesions.  PICC placed 12/31 and pharmacy consulted to start TPN for prolonged ileus.    Significant events:   Today:    Glucose - at goal  Electrolytes - K a little low, replaced  Renal - stable, no issues noted  LFTs - WNL  TGs - Will check baseline tomorrow AM  Prealbumin - Will check baseline tomorrow AM  NUTRITIONAL GOALS  RD recs: pending Clinimix E5/20 at a goal rate of 8480ml/hr + 20% fat emulsion at 7510ml/hr to provide: 96g/day protein, 2169Kcal/day.  PLAN                                                                                                                          Order TPN labs for tomorrow morning F/u RD recommendations Start TPN at 1800 tomorrow night and plan to advance as tolerated to the goal rate. TPN to contain standard multivitamins and trace elements.  Haynes Hoehnolleen Vanassa Penniman, PharmD, BCPS 09/22/2014, 6:06 PM  Pager: [phone]

## 2014-09-22 NOTE — Progress Notes (Addendum)
Admitted on 12/29 with pSBO, s/p open right ureteral reimplant on 09/13/14 with extensive pelvic lysis of adhesions.  Intv: No issues overnight KUB this AM pending Pain reasonably well controlled, improved from yesterday Persistent nausea   PE Filed Vitals:   09/21/14 1109 09/21/14 1345 09/21/14 2050 09/22/14 0521  BP: 111/70 97/63 108/71 151/88  Pulse: 64 66 61 67  Temp:  98.9 F (37.2 C) 98.5 F (36.9 C) 98.1 F (36.7 C)  TempSrc:  Oral Oral Oral  Resp:  20 20 20   Height:      Weight:      SpO2:  100% 100% 97%    Intake/Output Summary (Last 24 hours) at 09/22/14 0746 Last data filed at 09/22/14 [id number]  Gross per 24 hour  Intake   2175 ml  Output   1750 ml  Net    425 ml   NAD Abdomen is soft Epigastric/upper abdominal tenderness - improved from yesterday Incision c/d/i Foley draining clear yellow urine Extremities symmetric   Recent Labs  09/20/14 1425 09/21/14 0407  WBC 10.2 7.1  HGB 13.0 10.2*  HCT 38.6 29.7*    Recent Labs  09/20/14 1910 09/21/14 0407 09/22/14 0355  NA 138 137 139  K 3.8 3.6 3.3*  CL 103 104 104  CO2 28 28 27   GLUCOSE 108* 107* 95  BUN 14 12 9   CREATININE 0.92 0.89 1.01  CALCIUM 8.8 8.8 8.7   No results for input(s): LABPT, INR in the last 72 hours. No results for input(s): LABURIN in the last 72 hours. Results for orders placed or performed during the hospital encounter of 09/20/14  Urine culture     Status: None (Preliminary result)   Collection Time: 09/20/14  4:21 PM  Result Value Ref Range Status   Specimen Description URINE, CLEAN CATCH  Final   Special Requests NONE  Final   Colony Count   Final    >=100,000 COLONIES/ML Performed at [facility]    Culture   Final    GRAM NEGATIVE RODS Performed at [facility]    Report Status PENDING  Incomplete     12/30: KUB - normal appearing gas pattern - contrast appears to have made it through SB and is now in colon.  Imp- pSBO from post-surgical  adhesions, replacing potassium today. Plan -  Bowel rest - will consider PICC placement and starting TPN if SBO doesn't resolve within next 48hrs. IV medications Serial KUB and BMP Low threshold for NG Appreciate GS input.

## 2014-09-22 NOTE — Progress Notes (Signed)
Subjective: Some flatus and walking, no nausea this Am.   Objective: Vital signs in last 24 hours: Temp:  [98.1 F (36.7 C)-98.9 F (37.2 C)] 98.1 F (36.7 C) (12/31 0521) Pulse Rate:  [61-67] 67 (12/31 0521) Resp:  [20] 20 (12/31 0521) BP: (97-151)/(63-88) 151/88 mmHg (12/31 0521) SpO2:  [97 %-100 %] 97 % (12/31 0521) Last BM Date: 09/20/14 NPO Good urine output Afebrile, VSS K+3.3 Intake/Output from previous day: 12/30 0701 - 12/31 0700 In: 2175 [I.V.:1875; IV Piggyback:300] Out: 1750 [Urine:1750] Intake/Output this shift:    General appearance: alert, cooperative and no distress GI: soft up in chair so I can't really tell if she's distended she thinks she is.  Few BS.   Lab Results:   Recent Labs  09/20/14 1425 09/21/14 0407  WBC 10.2 7.1  HGB 13.0 10.2*  HCT 38.6 29.7*  PLT 427* 338    BMET  Recent Labs  09/21/14 0407 09/22/14 0355  NA 137 139  K 3.6 3.3*  CL 104 104  CO2 28 27  GLUCOSE 107* 95  BUN 12 9  CREATININE 0.89 1.01  CALCIUM 8.8 8.7   PT/INR No results for input(s): LABPROT, INR in the last 72 hours.   Recent Labs Lab 09/20/14 1425  AST 37  ALT 22  ALKPHOS 72  BILITOT 0.3  PROT 8.4*  ALBUMIN 4.0     Lipase     Component Value Date/Time   LIPASE 21 09/20/2014 1425     Studies/Results: Dg Abd 1 View  09/21/2014   CLINICAL DATA:  Small-bowel obstruction.  EXAM: ABDOMEN - 1 VIEW  COMPARISON:  CT 09/20/2014.  FINDINGS: Soft tissues unremarkable. Right ureteral stent noted in good anatomic position. Surgical staples noted over the pelvis. Persistent dilated loops of small bowel noted. Oral contrast from prior CT noted in the colon. No free air. No acute bony abnormality.  IMPRESSION: 1. Persistent dilated loops of small bowel. Oral contrast from prior CT is in the colon. Partial small bowel obstruction and/or adynamic ileus could present in this fashion. 2. Right ureteral double-J stent in good anatomic position.    Electronically Signed   By: Maisie Fushomas  Register   On: 09/21/2014 07:55   Ct Abdomen Pelvis W Contrast  09/20/2014   CLINICAL DATA:  Bladder spasms. Right ureteral reimplantation on 09/13/2014.  EXAM: CT ABDOMEN AND PELVIS WITH CONTRAST  TECHNIQUE: Multidetector CT imaging of the abdomen and pelvis was performed using the standard protocol following bolus administration of intravenous contrast.  CONTRAST:  100mL OMNIPAQUE IOHEXOL 300 MG/ML  SOLN  COMPARISON:  CT scan dated 09/28/2009 and 02/01/2008  FINDINGS: There are multiple dilated loops of mid small bowel. Proximal and distal small bowel are not distended. The colon is not distended. Terminal ileum is normal.  Double pigtail right ureteral stent is in place. The pelvicaliceal system and proximal right ureter are dilated. The distal right ureter is not distended. There is some excretion of contrast into the dilated right pelvocaliceal system. There is slight dilatation of the biliary tree including the intrahepatic ducts. Common bile duct measures 10 mm in diameter. There is no visible stone or mass in the distal duct. The gallbladder appears normal. No focal lesions in the liver.  Spleen, pancreas, and adrenal glands are normal. No free air or free fluid. Foley catheter in the decompressed bladder.  No acute osseous abnormality. 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 Moderate arthritis of both hips.  IMPRESSION: 1. New dilatation of  mid small bowel loops which could represent a focal ileus or partial small bowel obstruction. 2. Persistent dilatation of the proximal right renal collecting system. Double pigtail stent appears in good position on the right. 3. Slight dilatation of the biliary tree. No elevation of the patient's bilirubin. This is therefore not felt to be significant.   Electronically Signed   By: Geanie CooleyJim  Maxwell M.D.   On: 09/20/2014 16:28   Dg Abd 2 Views  09/22/2014   CLINICAL DATA:  Small bowel obstruction, persistent  abdominal pain and nausea  EXAM: ABDOMEN - 2 VIEW  COMPARISON:  Abdomen films of 09/21/2014 and CT abdomen pelvis of 09/20/2014  FINDINGS: Supine and erect views of the abdomen show persistently dilated loops of small bowel with differential air-fluid levels consistent with a persistent partial small bowel obstruction. No free air is seen on the erect view. Some contrast is noted throughout the nondistended colon. A right double-J stent is present although there is now coiling of the proximal portion of the stent possibly within the proximal right ureter.  IMPRESSION: 1. Persistent partial small bowel obstruction.  No free air. 2. The proximal portion of the right double-J stent now coils, possibly within the proximal right ureter.   Electronically Signed   By: Dwyane DeePaul  Barry M.D.   On: 09/22/2014 08:08    Medications: . heparin subcutaneous  5,000 Units Subcutaneous 3 times per day  . pantoprazole (PROTONIX) IV  40 mg Intravenous Daily  . potassium chloride  10 mEq Intravenous Q1 Hr x 6  . triamterene-hydrochlorothiazide  1 tablet Oral Daily    Assessment/Plan SBO Right distal ureteral stricture, S/p OPEN RIGHT URETERAL REIMPLANT; Surgeon: Crist FatBenjamin W Herrick, MD, 09/13/14. Hypertension GERD DEpression Osteoarthritis Hyperlipidemia   Continue current treatment.  Dr. Marlou PorchHerrick is replacing K+    LOS: 2 days    Candus Braud 09/22/2014

## 2014-09-22 NOTE — Progress Notes (Signed)
Peripherally Inserted Central Catheter/Midline Placement  The IV Nurse has discussed with the patient and/or persons authorized to consent for the patient, the purpose of this procedure and the potential benefits and risks involved with this procedure.  The benefits include less needle sticks, lab draws from the catheter and patient may be discharged home with the catheter.  Risks include, but not limited to, infection, bleeding, blood clot (thrombus formation), and puncture of an artery; nerve damage and irregular heat beat.  Alternatives to this procedure were also discussed.  PICC/Midline Placement Documentation        Jennifer Sanford, Jennifer Sanford 09/22/2014, 12:26 PM

## 2014-09-23 ENCOUNTER — Inpatient Hospital Stay (HOSPITAL_COMMUNITY): Payer: 59

## 2014-09-23 LAB — DIFFERENTIAL
Basophils Absolute: 0 10*3/uL (ref 0.0–0.1)
Basophils Relative: 0 % (ref 0–1)
Eosinophils Absolute: 0.2 10*3/uL (ref 0.0–0.7)
Eosinophils Relative: 4 % (ref 0–5)
Lymphocytes Relative: 43 % (ref 12–46)
Lymphs Abs: 2.3 10*3/uL (ref 0.7–4.0)
Monocytes Absolute: 0.6 10*3/uL (ref 0.1–1.0)
Monocytes Relative: 11 % (ref 3–12)
Neutro Abs: 2.3 10*3/uL (ref 1.7–7.7)
Neutrophils Relative %: 42 % — ABNORMAL LOW (ref 43–77)

## 2014-09-23 LAB — COMPREHENSIVE METABOLIC PANEL
ALT: 14 U/L (ref 0–35)
AST: 18 U/L (ref 0–37)
Albumin: 2.8 g/dL — ABNORMAL LOW (ref 3.5–5.2)
Alkaline Phosphatase: 50 U/L (ref 39–117)
Anion gap: 3 — ABNORMAL LOW (ref 5–15)
BUN: 8 mg/dL (ref 6–23)
CO2: 28 mmol/L (ref 19–32)
Calcium: 8.6 mg/dL (ref 8.4–10.5)
Chloride: 107 mEq/L (ref 96–112)
Creatinine, Ser: 0.95 mg/dL (ref 0.50–1.10)
GFR calc Af Amer: 73 mL/min — ABNORMAL LOW (ref >90)
GFR calc non Af Amer: 63 mL/min — ABNORMAL LOW (ref >90)
Glucose, Bld: 92 mg/dL (ref 70–99)
Potassium: 3.7 mmol/L (ref 3.5–5.1)
Sodium: 138 mmol/L (ref 135–145)
Total Bilirubin: 0.3 mg/dL (ref 0.3–1.2)
Total Protein: 5.9 g/dL — ABNORMAL LOW (ref 6.0–8.3)

## 2014-09-23 LAB — GLUCOSE, CAPILLARY
Glucose-Capillary: 88 mg/dL (ref 70–99)
Glucose-Capillary: 93 mg/dL (ref 70–99)

## 2014-09-23 LAB — CBC
HCT: 28.2 % — ABNORMAL LOW (ref 36.0–46.0)
Hemoglobin: 9.1 g/dL — ABNORMAL LOW (ref 12.0–15.0)
MCH: 29 pg (ref 26.0–34.0)
MCHC: 32.3 g/dL (ref 30.0–36.0)
MCV: 89.8 fL (ref 78.0–100.0)
Platelets: 375 10*3/uL (ref 150–400)
RBC: 3.14 MIL/uL — ABNORMAL LOW (ref 3.87–5.11)
RDW: 13.7 % (ref 11.5–15.5)
WBC: 5.4 10*3/uL (ref 4.0–10.5)

## 2014-09-23 LAB — TRIGLYCERIDES: Triglycerides: 99 mg/dL (ref <150)

## 2014-09-23 LAB — PHOSPHORUS: Phosphorus: 4.2 mg/dL (ref 2.3–4.6)

## 2014-09-23 LAB — MAGNESIUM: Magnesium: 1.6 mg/dL (ref 1.5–2.5)

## 2014-09-23 MED ORDER — DEXTROSE IN LACTATED RINGERS 5 % IV SOLN
INTRAVENOUS | Status: AC
  Administered 2014-09-23 (×2): via INTRAVENOUS

## 2014-09-23 MED ORDER — TRACE MINERALS CR-CU-F-FE-I-MN-MO-SE-ZN IV SOLN
INTRAVENOUS | Status: AC
  Administered 2014-09-23: 17:00:00 via INTRAVENOUS
  Filled 2014-09-23: qty 1000

## 2014-09-23 MED ORDER — FAT EMULSION 20 % IV EMUL
250.0000 mL | INTRAVENOUS | Status: AC
  Administered 2014-09-23: 250 mL via INTRAVENOUS
  Filled 2014-09-23: qty 250

## 2014-09-23 MED ORDER — INSULIN ASPART 100 UNIT/ML ~~LOC~~ SOLN: 0.0000 [IU] | Freq: Four times a day (QID) | SUBCUTANEOUS | Status: DC

## 2014-09-23 NOTE — Progress Notes (Signed)
PARENTERAL NUTRITION CONSULT NOTE - INITIAL  Pharmacy Consult for TPN Indication: Prolonged ileus  No Known Allergies  Patient Measurements: Height:  (177.8 cm) Weight: 202 lb (91.627 kg) IBW/kg (Calculated) : 68.5 Adjusted Body Weight: 75 kg  Vital Signs: Temp: 98.2 F (36.8 C) (01/01 0609) Temp Source: Oral (01/01 0609) BP: 135/76 mmHg (01/01 0609) Pulse Rate: 64 (01/01 0609) Intake/Output from previous day: 12/31 0701 - 01/01 0700 In: 1500 [I.V.:1500] Out: 1700 [Urine:1700] Intake/Output from this shift:    Labs:  Recent Labs  09/20/14 1425 09/21/14 0407 09/23/14 0615  WBC 10.2 7.1 5.4  HGB 13.0 10.2* 9.1*  HCT 38.6 29.7* 28.2*  PLT 427* 338 375     Recent Labs  09/20/14 1425  09/21/14 0407 09/22/14 0355 09/23/14 0615  NA 139  < > 137 139 138  K 4.0  < > 3.6 3.3* 3.7  CL 101  < > 104 104 107  CO2 28  < > GLUCOSE 97  < > 107* 95 92  BUN 16  < > CREATININE 0.98  < > 0.89 1.01 0.95  CALCIUM 10.0  < > 8.8 8.7 8.6  MG  --   --   --   --  1.6  PHOS  --   --   --   --  4.2  PROT 8.4*  --   --   --  5.9*  ALBUMIN 4.0  --   --   --  2.8*  AST 37  --   --   --  18  ALT 22  --   --   --  14  ALKPHOS 72  --   --   --  50  BILITOT 0.3  --   --   --  0.3  TRIG  --   --   --   --  99  < > = values in this interval not displayed. Estimated Creatinine Clearance: 76.3 mL/min (by C-G formula based on Cr of 0.95).   No results for input(s): GLUCAP in the last 72 hours.  Medical History: Past Medical History  Diagnosis Date  . Hypertension   . Depression   . GERD (gastroesophageal reflux disease)   . Complication of anesthesia     VERY CLAUSTROPHIC W/ MASK  . Ureteral stricture, right   . Hydronephrosis, right   . Hyperlipemia   . OA (osteoarthritis)     Insulin Requirements: No insulin required yet, No hx DM  Current Nutrition: NPO  IVF: D5LR @ 125 ml/hr  Central access: R brachial PICC 12/31 TPN start date: 09/23/14    ASSESSMENT                                                                                                          HPI: 45 yoF admitted on 12/29 with pSBO, s/p open right ureteral reimplant on 09/13/14 with extensive pelvic lysis of adhesions. Urology and CCS managing; suspecting will resolve without operative management. PICC placed 12/31 and pharmacy consulted  to start TPN for prolonged ileus.    Significant events:   Today: 09/23/2014  Glucose - at goal < 150  Electrolytes - K now WNL after replacement, all others WNL  Renal - stable, no issues noted  LFTs - WNL  TGs - 99 today  Prealbumin - pending today  NUTRITIONAL GOALS                                                                                             RD recs: pending Clinimix E5/20 at a goal rate of 48ml/hr + 20% fat emulsion at 107ml/hr to provide: 96g/day protein, 2169Kcal/day.  PLAN  1. At 1800 today:  Begin Clinimix-E 5/20 at 40 mL/hr.  20% fat emulsion at 10 mL/hr.  Plan to advance as tolerated to the goal rate.  TPN to contain multivitamin and standard trace elements daily  Reduce IVF to 75 mL/hr.  Chec CBGs q6h and cover with Novolog SSI sensitive-scale. 2. CMet, Mg, Phos tomorrow AM 3. TPN lab panels on Mondays & Thursdays. 4. Follow up RD recommendations and adjust formula/rate as needed 5. Follow daily.  Loralee Pacas, PharmD, BCPS Pager: [phone] 09/23/2014, 10:31 AM

## 2014-09-23 NOTE — Progress Notes (Signed)
Patient ID: Jennifer Sanford, female   DOB: 1951-05-20, 64 y.o.   MRN: [id number]  General Surgery - [facility] - Progress Note  Subjective: Patient up in chair.  Ambulating frequently.  Denies pain.  Small flatus last night, no BM.  Mild nausea controlled with Zofran.  No emesis.  Objective: Vital signs in last 24 hours: Temp:  [98 F (36.7 C)-98.3 F (36.8 C)] 98.2 F (36.8 C) (01/01 0609) Pulse Rate:  [60-64] 64 (01/01 0609) Resp:  [18] 18 (01/01 0609) BP: (123-135)/(75-78) 135/76 mmHg (01/01 0609) SpO2:  [100 %] 100 % (01/01 0609) Last BM Date: 09/20/14  Intake/Output from previous day: 09-29-2023 0701 - 01/01 0700 In: 1500 [I.V.:1500] Out: 1700 [Urine:1700]  Exam: HEENT - clear, not icteric Abd - soft without distension; few BS present; non-tender Ext - no significant edema Neuro - grossly intact, no focal deficits  Lab Results:   Recent Labs  09/21/14 0407 09/23/14 0615  WBC 7.1 5.4  HGB 10.2* 9.1*  HCT 29.7* 28.2*  PLT 338 375     Recent Labs  09/28/2014 0355 09/23/14 0615  NA 139 138  K 3.3* 3.7  CL 104 107  CO2 27 28  GLUCOSE 95 92  BUN 9 8  CREATININE 1.01 0.95  CALCIUM 8.7 8.6    Studies/Results: Dg Abd 2 Views  09-28-14   CLINICAL DATA:  Small bowel obstruction, persistent abdominal pain and nausea  EXAM: ABDOMEN - 2 VIEW  COMPARISON:  Abdomen films of 09/21/2014 and CT abdomen pelvis of 09/20/2014  FINDINGS: Supine and erect views of the abdomen show persistently dilated loops of small bowel with differential air-fluid levels consistent with a persistent partial small bowel obstruction. No free air is seen on the erect view. Some contrast is noted throughout the nondistended colon. A right double-J stent is present although there is now coiling of the proximal portion of the stent possibly within the proximal right ureter.  IMPRESSION: 1. Persistent partial small bowel obstruction.  No free air. 2. The proximal portion of the right  double-J stent now coils, possibly within the proximal right ureter.   Electronically Signed   By: Dwyane Dee M.D.   On: 09/28/2014 08:08    Assessment / Plan: Small bowel obstruction  Continue NPO, IVF  Encouraged ambulation  No need for NG tube at present Right distal ureteral stricture, OPEN RIGHT URETERAL REIMPLANT on 09/13/14. Hypertension GERD Depression Osteoarthritis Hyperlipidemia  Continue present management.  Will check AXR next 24 hours.  Suspect this will resolve without operative management.  Encouraged further ambulation, OOB.  Velora Heckler, MD, [facility] Office: [phone]  09/23/2014

## 2014-09-23 NOTE — Progress Notes (Signed)
  Subjective: Patient reports that she had some flatus last night. Still with nausea and some abdominal pain. No bowel movements yet.  Objective: Vital signs in last 24 hours: Temp:  [98 F (36.7 C)-98.3 F (36.8 C)] 98.2 F (36.8 C) (01/01 0609) Pulse Rate:  [60-64] 64 (01/01 0609) Resp:  [18] 18 (01/01 0609) BP: (123-135)/(75-78) 135/76 mmHg (01/01 0609) SpO2:  [100 %] 100 % (01/01 0609)  Intake/Output from previous day: 10/03/23 0701 - 01/01 0700 In: 500 [I.V.:500] Out: 1700 [Urine:1700] Intake/Output this shift:    Physical Exam:  Constitutional: Vital signs reviewed. WD WN in NAD   Eyes: PERRL, No scleral icterus.   Cardiovascular: RRR Pulmonary/Chest: Normal effort Abdominal: Soft, without rebound. Some tenderness more on the right and upper abdomen. Bowel sounds present, incision seems to be healing well.   Lab Results:  Recent Labs  09/20/14 1425 09/21/14 0407 09/23/14 0615  HGB 13.0 10.2* 9.1*  HCT 38.6 29.7* 28.2*   BMET  Recent Labs  October 02, 2014 0355 09/23/14 0615  NA 139 138  K 3.3* 3.7  CL 104 107  CO2 27 28  GLUCOSE 95 92  BUN 9 8  CREATININE 1.01 0.95  CALCIUM 8.7 8.6   No results for input(s): LABPT, INR in the last 72 hours. No results for input(s): LABURIN in the last 72 hours. Results for orders placed or performed during the hospital encounter of 09/20/14  Urine culture     Status: None (Preliminary result)   Collection Time: 09/20/14  4:21 PM  Result Value Ref Range Status   Specimen Description URINE, CLEAN CATCH  Final   Special Requests NONE  Final   Colony Count   Final    >=100,000 COLONIES/ML Performed at [facility]    Culture   Final    GRAM NEGATIVE RODS Performed at [facility]    Report Status PENDING  Incomplete    Studies/Results: Dg Abd 2 Views  10-02-14   CLINICAL DATA:  Small bowel obstruction, persistent abdominal pain and nausea  EXAM: ABDOMEN - 2 VIEW  COMPARISON:  Abdomen films of  09/21/2014 and CT abdomen pelvis of 09/20/2014  FINDINGS: Supine and erect views of the abdomen show persistently dilated loops of small bowel with differential air-fluid levels consistent with a persistent partial small bowel obstruction. No free air is seen on the erect view. Some contrast is noted throughout the nondistended colon. A right double-J stent is present although there is now coiling of the proximal portion of the stent possibly within the proximal right ureter.  IMPRESSION: 1. Persistent partial small bowel obstruction.  No free air. 2. The proximal portion of the right double-J stent now coils, possibly within the proximal right ureter.   Electronically Signed   By: Dwyane Dee M.D.   On: 10-02-2014 08:08   I reviewed abdominal series images with patient. She has a small amount of small bowel gas present. There is contrast within the colon and gas in the rectum. Assessment/Plan:   Small bowel obstruction, stable/slightly improving. I do think that she would benefit from TNA. We will start that today.   LOS: 3 days   Marcine Matar M 09/23/2014, 7:31 AM

## 2014-09-24 ENCOUNTER — Inpatient Hospital Stay (HOSPITAL_COMMUNITY): Payer: 59

## 2014-09-24 LAB — COMPREHENSIVE METABOLIC PANEL
ALT: 14 U/L (ref 0–35)
AST: 20 U/L (ref 0–37)
Albumin: 3 g/dL — ABNORMAL LOW (ref 3.5–5.2)
Alkaline Phosphatase: 53 U/L (ref 39–117)
Anion gap: 6 (ref 5–15)
BUN: 9 mg/dL (ref 6–23)
CO2: 29 mmol/L (ref 19–32)
Calcium: 8.9 mg/dL (ref 8.4–10.5)
Chloride: 107 mEq/L (ref 96–112)
Creatinine, Ser: 0.99 mg/dL (ref 0.50–1.10)
GFR calc Af Amer: 70 mL/min — ABNORMAL LOW (ref >90)
GFR calc non Af Amer: 60 mL/min — ABNORMAL LOW (ref >90)
Glucose, Bld: 100 mg/dL — ABNORMAL HIGH (ref 70–99)
Potassium: 3.5 mmol/L (ref 3.5–5.1)
Sodium: 142 mmol/L (ref 135–145)
Total Bilirubin: 0.3 mg/dL (ref 0.3–1.2)
Total Protein: 6.1 g/dL (ref 6.0–8.3)

## 2014-09-24 LAB — GLUCOSE, CAPILLARY
Glucose-Capillary: 98 mg/dL (ref 70–99)
Glucose-Capillary: 86 mg/dL (ref 70–99)
Glucose-Capillary: 99 mg/dL (ref 70–99)

## 2014-09-24 LAB — PHOSPHORUS: Phosphorus: 4.4 mg/dL (ref 2.3–4.6)

## 2014-09-24 LAB — MAGNESIUM: Magnesium: 1.6 mg/dL (ref 1.5–2.5)

## 2014-09-24 MED ORDER — TRACE MINERALS CR-CU-F-FE-I-MN-MO-SE-ZN IV SOLN
INTRAVENOUS | Status: AC
  Administered 2014-09-24: 18:00:00 via INTRAVENOUS
  Filled 2014-09-24: qty 2000

## 2014-09-24 MED ORDER — DEXTROSE IN LACTATED RINGERS 5 % IV SOLN: INTRAVENOUS | Status: AC

## 2014-09-24 MED ORDER — MAGNESIUM SULFATE IN D5W 10-5 MG/ML-% IV SOLN
1.0000 g | Freq: Once | INTRAVENOUS | Status: AC
  Administered 2014-09-24: 1 g via INTRAVENOUS
  Filled 2014-09-24: qty 100

## 2014-09-24 MED ORDER — FAT EMULSION 20 % IV EMUL
250.0000 mL | INTRAVENOUS | Status: AC
  Administered 2014-09-24: 250 mL via INTRAVENOUS
  Filled 2014-09-24: qty 250

## 2014-09-24 MED ORDER — POTASSIUM CHLORIDE 10 MEQ/100ML IV SOLN
10.0000 meq | INTRAVENOUS | Status: AC
  Administered 2014-09-24 (×3): 10 meq via INTRAVENOUS
  Filled 2014-09-24 (×3): qty 100

## 2014-09-24 NOTE — Progress Notes (Signed)
PARENTERAL NUTRITION CONSULT NOTE  Pharmacy Consult for TPN Indication: Prolonged ileus  No Known Allergies  Patient Measurements: Height:  (177.8 cm) Weight: 202 lb (91.627 kg) IBW/kg (Calculated) : 68.5 Adjusted Body Weight: 75 kg  Vital Signs: Temp: 98.4 F (36.9 C) (01/02 0657) Temp Source: Oral (01/02 0657) BP: 132/87 mmHg (01/02 0657) Pulse Rate: 96 (01/02 0657) Intake/Output from previous day: 01/01 0701 - 01/02 0700 In: 2388.8 [I.V.:2388.8] Out: 2175 [Urine:2175] Intake/Output from this shift:    Labs:  Recent Labs  09/23/14 0615  WBC 5.4  HGB 9.1*  HCT 28.2*  PLT 375     Recent Labs  09/22/14 0355 09/23/14 0615 09/24/14 0500  NA 139 138 142  K 3.3* 3.7 3.5  CL 104 107 107  CO2 GLUCOSE 95 92 100*  BUN CREATININE 1.01 0.95 0.99  CALCIUM 8.7 8.6 8.9  MG  --  1.6 1.6  PHOS  --  4.2 4.4  PROT  --  5.9* 6.1  ALBUMIN  --  2.8* 3.0*  AST  --  18 20  ALT  --  14 14  ALKPHOS  --  50 53  BILITOT  --  0.3 0.3  TRIG  --  99  --    Estimated Creatinine Clearance: 73.2 mL/min (by C-G formula based on Cr of 0.99).    Recent Labs  09/23/14 1814 09/23/14 2333 09/24/14 0629  GLUCAP 88 93 98    Medical History: Past Medical History  Diagnosis Date  . Hypertension   . Depression   . GERD (gastroesophageal reflux disease)   . Complication of anesthesia     VERY CLAUSTROPHIC W/ MASK  . Ureteral stricture, right   . Hydronephrosis, right   . Hyperlipemia   . OA (osteoarthritis)     Insulin Requirements:  No insulin required yet, No hx DM  Current Nutrition:  NPO Clinimix E 5/20 @ 40 ml/hr 20% fat emulsion @ 10 ml/hr  IVF: D5LR @ 75 ml/hr  Central access: R brachial PICC 12/31 TPN start date: 09/23/14   ASSESSMENT                                                                                                          HPI: 52 yoF admitted on 12/29 with pSBO, s/p open right ureteral reimplant on 09/13/14 with  extensive pelvic lysis of adhesions. Urology and CCS managing; suspecting will resolve without operative management. PICC placed 12/31 and pharmacy consulted to start TPN for prolonged ileus.    Significant events:  1/2: pt passing flatus, less abdominal pain, no BMs yet  Today: 09/24/2014  Glucose - at goal < 150  Electrolytes - K at low end of WNL at 3.5, Mag at low end of WNL at 1.6, all others WNL  Renal - stable, no issues noted  LFTs - WNL  TGs - 99 (1/1)  Prealbumin - pending  NUTRITIONAL GOALS  RD recs: pending Clinimix E5/20 at a goal rate of 59ml/hr + 20% fat emulsion at 99ml/hr to provide: 96g/day protein, 2169Kcal/day.  PLAN  1. At 1800 today:  Increase Clinimix-E 5/20 to 60 mL/hr.  20% fat emulsion at 10 mL/hr.  Plan to advance as tolerated to the goal rate.  TPN to contain multivitamin and standard trace elements daily  Reduce IVF to 55 mL/hr.  Continiue CBGs q6h and cover with Novolog SSI sensitive-scale. 2. Magnesium 1g IV bolus 3. KCL 10 mEq runs x3 4. CMet, Mg, Phos tomorrow AM 5. TPN lab panels on Mondays & Thursdays. 6. Follow up RD recommendations and adjust formula/rate as needed 7. Follow daily.  Thank you for the consult.  Ross Ludwig, PharmD, BCPS Pager: [phone] Pharmacy: [phone] 09/24/2014 10:02 AM

## 2014-09-24 NOTE — Progress Notes (Signed)
INITIAL NUTRITION ASSESSMENT  DOCUMENTATION CODES Per approved criteria  -Not Applicable   INTERVENTION: -TPN per pharmacy  -RD to continue to monitor  NUTRITION DIAGNOSIS: Inadequate oral intake related to inability to eat as evidenced by NPO status.   Goal: Pt to meet >/= 90% of their estimated nutrition needs   Monitor:  TPN regimen and tolerance, weight, labs, I/O's  Reason for Assessment: Consult for New TPN  Admitting Dx: Uretal Stricture  ASSESSMENT: 64 year old female who is 7 days status post open right ureteral implant with extensive pelvic lysis of adhesions. She was discharged from the hospital on 09/16/14 at which point she was tolerating a regular diet, albeit smaller meals, without nausea and was having normal and regular bowel movements.   Pt started on TPN d/t prolonged ileus.  Per pharmacy, pt is receiving Clinimix 5/20 at 40 ml/hr and 20% fat emulsion at 10 ml/hr, providing 1325 kcal and 48g of protein.  Pt reports N/V symptoms starting last Monday (12/28), states that she has not eaten since then.   Pt has had 9 lb weight loss, insignificant for time frame.  Labs reviewed: WNL  Height: Ht Readings from Last 1 Encounters:  09/20/14  (1.778 m)    Weight: Wt Readings from Last 1 Encounters:  09/20/14 202 lb (91.627 kg)    Ideal Body Weight: 150 lb  % Ideal Body Weight: 135%  Wt Readings from Last 10 Encounters:  09/20/14 202 lb (91.627 kg)  09/13/14 202 lb (91.627 kg)  09/09/14 202 lb 1.6 oz (91.672 kg)  08/22/14 195 lb (88.451 kg)  02/18/14 211 lb (95.709 kg)  05/27/13 216 lb (97.977 kg)  03/31/13 224 lb 9.6 oz (101.878 kg)  03/17/13 224 lb (101.606 kg)  03/12/13 224 lb (101.606 kg)  08/28/09 198 lb (89.812 kg)    Usual Body Weight: 195-202 lb -per pt  % Usual Body Weight: 100%  BMI:  Body mass index is 28.98 kg/(m^2).  Estimated Nutritional Needs: Kcal: 2100-2300 Protein: 110-120g Fluid: 2.1L/day  Skin: surgical  incision  Diet Order: Diet NPO time specified TPN (CLINIMIX-E) Adult TPN (CLINIMIX-E) Adult  EDUCATION NEEDS: -No education needs identified at this time   Intake/Output Summary (Last 24 hours) at 09/24/14 1128 Last data filed at 09/24/14 0658  Gross per 24 hour  Intake 2388.75 ml  Output   2175 ml  Net 213.75 ml    Last BM: 12/29  Labs:   Recent Labs Lab 09/22/14 0355 09/23/14 0615 09/24/14 0500  NA 139 138 142  K 3.3* 3.7 3.5  CL 104 107 107  CO2 BUN CREATININE 1.01 0.95 0.99  CALCIUM 8.7 8.6 8.9  MG  --  1.6 1.6  PHOS  --  4.2 4.4  GLUCOSE 95 92 100*    CBG (last 3)   Recent Labs  09/23/14 1814 09/23/14 2333 09/24/14 0629  GLUCAP 88 93 98    Scheduled Meds: . heparin subcutaneous  5,000 Units Subcutaneous 3 times per day  . insulin aspart  0-9 Units Subcutaneous Q6H  . magnesium sulfate 1 - 4 g bolus IVPB  1 g Intravenous Once  . pantoprazole (PROTONIX) IV  40 mg Intravenous Daily  . potassium chloride  10 mEq Intravenous Q1 Hr x 3  . triamterene-hydrochlorothiazide  1 tablet Oral Daily    Continuous Infusions: . dextrose 5% lactated ringers 75 mL/hr at 09/23/14 2337  . dextrose 5% lactated ringers    . Marland KitchenTPN (CLINIMIX-E)  Adult 40 mL/hr at 09/23/14 1724   And  . fat emulsion 250 mL (09/23/14 1724)  . Marland KitchenTPN (CLINIMIX-E) Adult     And  . fat emulsion      Past Medical History  Diagnosis Date  . Hypertension   . Depression   . GERD (gastroesophageal reflux disease)   . Complication of anesthesia     VERY CLAUSTROPHIC W/ MASK  . Ureteral stricture, right   . Hydronephrosis, right   . Hyperlipemia   . OA (osteoarthritis)     Past Surgical History  Procedure Laterality Date  . Knee arthroscopy  left  2004//   bilateral 1980  . Breast reduction surgery Bilateral 04-12-2008  . 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 Knee closed reduction Bilateral 06/02/2013    Procedure: CLOSED MANIPULATION BILATERAL KNEES;  Surgeon: Loanne Drilling, MD;  Location: [facility];  Service: Orthopedics;  Laterality: Bilateral;  . Tonsillectomy and adenoidectomy  as child  . Exploratory laparotomy w/ right ureter repair  2000  . Cysto/  right retrograde pyelogram/  balloon dilation right ureter stricture/  stent placement  05-25-2010//   03-14-2009//   01-28-2009  . Cardiovascular stress test  12-26-2009    NORMAL  . Vaginal hysterectomy  2000    w/ right salpingoophorectomy  . Cystoscopy with stent placement Right 02/18/2014    Procedure: CYSTOSCOPY WITH right STENT PLACEMENT;  Surgeon: Danae Chen, MD;  Location: [facility];  Service: Urology;  Laterality: Right;  . Ureteral reimplantion Right 09/13/2014    Procedure: OPEN RIGHT URETERAL REIMPLANT;  Surgeon: Crist Fat, MD;  Location: [facility];  Service: Urology;  Laterality: Right;    Tilda Franco, MS, RD, LDN Pager: [phone] After Hours Pager: [phone]

## 2014-09-24 NOTE — Progress Notes (Signed)
  Subjective: Patient reports a bit less abdominal pain. She had flatus 2-3 times yesterday. She is getting up and around pretty well.  Objective: Vital signs in last 24 hours: Temp:  [98.5 F (36.9 C)-98.9 F (37.2 C)] 98.5 F (36.9 C) (01/01 2053) Pulse Rate:  [57-70] 70 (01/01 2053) Resp:  [16-18] 18 (01/01 2053) BP: (129-137)/(72-78) 137/72 mmHg (01/01 2053) SpO2:  [97 %-100 %] 97 % (01/01 2053)  Intake/Output from previous day: 01/01 0701 - 01/02 0700 In: 1500 [I.V.:1500] Out: 875 [Urine:875] Intake/Output this shift: Total I/O In: -  Out: 475 [Urine:475]  Physical Exam:  Constitutional: Vital signs reviewed. WD WN in NAD   Eyes: PERRL, No scleral icterus.   Pulmonary/Chest: Normal effort Abdominal: Soft. Bit less upper abdominal tenderness. No rebound or guarding.   Lab Results:  Recent Labs  09/23/14 0615  HGB 9.1*  HCT 28.2*   BMET  Recent Labs  09/22/14 0355 09/23/14 0615  NA 139 138  K 3.3* 3.7  CL 104 107  CO2 27 28  GLUCOSE 95 92  BUN 9 8  CREATININE 1.01 0.95  CALCIUM 8.7 8.6   No results for input(s): LABPT, INR in the last 72 hours. No results for input(s): LABURIN in the last 72 hours. Results for orders placed or performed during the hospital encounter of 09/20/14  Urine culture     Status: None (Preliminary result)   Collection Time: 09/20/14  4:21 PM  Result Value Ref Range Status   Specimen Description URINE, CLEAN CATCH  Final   Special Requests NONE  Final   Colony Count   Final    >=100,000 COLONIES/ML Performed at [facility]    Culture   Final    GRAM NEGATIVE RODS Performed at [facility]    Report Status PENDING  Incomplete    Studies/Results: Dg Abd 1 View  09/23/2014   CLINICAL DATA:  Small bowel obstruction.  Continued abdominal pain.  EXAM: ABDOMEN - 1 VIEW  COMPARISON:  09/22/2014  FINDINGS: Oral contrast material again noted within the: . Continued mildly prominent small bowel loops, similar  to prior study. No free air. No organomegaly. Right ureteral stent remains in place, unchanged.  IMPRESSION: Continued partial small bowel obstruction pattern. No significant change.   Electronically Signed   By: Charlett Nose M.D.   On: 09/23/2014 09:37   Dg Abd 2 Views  09/22/2014   CLINICAL DATA:  Small bowel obstruction, persistent abdominal pain and nausea  EXAM: ABDOMEN - 2 VIEW  COMPARISON:  Abdomen films of 09/21/2014 and CT abdomen pelvis of 09/20/2014  FINDINGS: Supine and erect views of the abdomen show persistently dilated loops of small bowel with differential air-fluid levels consistent with a persistent partial small bowel obstruction. No free air is seen on the erect view. Some contrast is noted throughout the nondistended colon. A right double-J stent is present although there is now coiling of the proximal portion of the stent possibly within the proximal right ureter.  IMPRESSION: 1. Persistent partial small bowel obstruction.  No free air. 2. The proximal portion of the right double-J stent now coils, possibly within the proximal right ureter.   Electronically Signed   By: Dwyane Dee M.D.   On: 09/22/2014 08:08    Assessment/Plan:    Partial small bowel obstruction, resolving. On TPN. Continue conservative management.  LOS: 4 days   Marcine Matar M 09/24/2014, 6:42 AM

## 2014-09-24 NOTE — Progress Notes (Signed)
Patient ID: Jennifer Sanford, female   DOB: [birth year], 64 y.o.   MRN: [id number]  General Surgery - [facility] - Progress Note  Subjective: Patient up in room, family at bedside.  Mild nausea, no emesis.  Passing flatus last night and first thing this AM.  No BM's.  No crampy abdominal pain.  Objective: Vital signs in last 24 hours: Temp:  [98.4 F (36.9 C)-98.9 F (37.2 C)] 98.4 F (36.9 C) (01/02 0657) Pulse Rate:  [57-96] 96 (01/02 0657) Resp:  [16-18] 18 (01/02 0657) BP: (129-137)/(72-87) 132/87 mmHg (01/02 0657) SpO2:  [97 %-100 %] 100 % (01/02 0657) Last BM Date: 09/20/14  Intake/Output from previous day: 01/01 0701 - 01/02 0700 In: 2388.8 [I.V.:2388.8] Out: 2175 [Urine:2175]  Exam: HEENT - clear, not icteric Neck - soft Chest - clear bilaterally Cor - RRR, no murmur Abd - soft, mild distension; few BS present; mild diffuse tenderness, no rebound, no guarding Ext - no significant edema Neuro - grossly intact, no focal deficits  Lab Results:   Recent Labs  09/23/14 0615  WBC 5.4  HGB 9.1*  HCT 28.2*  PLT 375     Recent Labs  09/23/14 0615 09/24/14 0500  NA 138 142  K 3.7 3.5  CL 107 107  CO2 28 29  GLUCOSE 92 100*  BUN 8 9  CREATININE 0.95 0.99  CALCIUM 8.6 8.9    Studies/Results: Dg Abd 1 View  09/23/2014   CLINICAL DATA:  Small bowel obstruction.  Continued abdominal pain.  EXAM: ABDOMEN - 1 VIEW  COMPARISON:  09/22/2014  FINDINGS: Oral contrast material again noted within the: . Continued mildly prominent small bowel loops, similar to prior study. No free air. No organomegaly. Right ureteral stent remains in place, unchanged.  IMPRESSION: Continued partial small bowel obstruction pattern. No significant change.   Electronically Signed   By: Charlett Nose M.D.   On: 09/23/2014 09:37    Assessment / Plan: 1. Small bowel obstruction Continue NPO, IVF, TNA Encouraged ambulation, OOB No need for  NG tube at present  Repeat AXR in AM 1/3 2. Right distal ureteral stricture, OPEN RIGHT URETERAL REIMPLANT on 09/13/14. 3. Hypertension  Patient reassured.  Will continue to follow.  Velora Heckler, MD, [facility] Office: [phone]  09/24/2014

## 2014-09-24 NOTE — Progress Notes (Signed)
Pt felt like her catheter was leaking and about to fall out. Upon inspection, catheter was in place, so this RN added 5 cc of NS to the balloon and patient stated that the catheter felt much better and when she stood she did not feel leaking. Will continue to monitor.

## 2014-09-25 ENCOUNTER — Inpatient Hospital Stay (HOSPITAL_COMMUNITY): Payer: 59

## 2014-09-25 LAB — URINE CULTURE: Colony Count: >=100000

## 2014-09-25 LAB — PREALBUMIN: Prealbumin: 17.8 mg/dL — ABNORMAL LOW (ref 17.0–34.0)

## 2014-09-25 LAB — COMPREHENSIVE METABOLIC PANEL
ALT: 16 U/L (ref 0–35)
AST: 25 U/L (ref 0–37)
Albumin: 2.9 g/dL — ABNORMAL LOW (ref 3.5–5.2)
Alkaline Phosphatase: 55 U/L (ref 39–117)
Anion gap: 4 — ABNORMAL LOW (ref 5–15)
BUN: 11 mg/dL (ref 6–23)
CO2: 28 mmol/L (ref 19–32)
Calcium: 8.7 mg/dL (ref 8.4–10.5)
Chloride: 107 mEq/L (ref 96–112)
Creatinine, Ser: 0.93 mg/dL (ref 0.50–1.10)
GFR calc Af Amer: 75 mL/min — ABNORMAL LOW (ref >90)
GFR calc non Af Amer: 65 mL/min — ABNORMAL LOW (ref >90)
Glucose, Bld: 95 mg/dL (ref 70–99)
Potassium: 3.5 mmol/L (ref 3.5–5.1)
Sodium: 139 mmol/L (ref 135–145)
Total Bilirubin: 0.3 mg/dL (ref 0.3–1.2)
Total Protein: 6.3 g/dL (ref 6.0–8.3)

## 2014-09-25 LAB — GLUCOSE, CAPILLARY
Glucose-Capillary: 102 mg/dL — ABNORMAL HIGH (ref 70–99)
Glucose-Capillary: 79 mg/dL (ref 70–99)
Glucose-Capillary: 82 mg/dL (ref 70–99)
Glucose-Capillary: 95 mg/dL (ref 70–99)

## 2014-09-25 LAB — MAGNESIUM: Magnesium: 1.7 mg/dL (ref 1.5–2.5)

## 2014-09-25 LAB — PHOSPHORUS: Phosphorus: 4.7 mg/dL — ABNORMAL HIGH (ref 2.3–4.6)

## 2014-09-25 MED ORDER — CIPROFLOXACIN HCL 500 MG PO TABS
500.0000 mg | ORAL_TABLET | Freq: Two times a day (BID) | ORAL | Status: AC
  Administered 2014-09-25 – 2014-09-30 (×10): 500 mg via ORAL
  Filled 2014-09-25 (×10): qty 1

## 2014-09-25 MED ORDER — TRACE MINERALS CR-CU-F-FE-I-MN-MO-SE-ZN IV SOLN
INTRAVENOUS | Status: AC
  Administered 2014-09-25: 18:00:00 via INTRAVENOUS
  Filled 2014-09-25: qty 2000

## 2014-09-25 MED ORDER — MAGNESIUM SULFATE IN D5W 10-5 MG/ML-% IV SOLN
1.0000 g | Freq: Once | INTRAVENOUS | Status: AC
  Administered 2014-09-25: 1 g via INTRAVENOUS
  Filled 2014-09-25: qty 100

## 2014-09-25 MED ORDER — POTASSIUM CHLORIDE 10 MEQ/100ML IV SOLN
10.0000 meq | INTRAVENOUS | Status: AC
  Administered 2014-09-25 (×4): 10 meq via INTRAVENOUS
  Filled 2014-09-25 (×4): qty 100

## 2014-09-25 MED ORDER — DEXTROSE IN LACTATED RINGERS 5 % IV SOLN
INTRAVENOUS | Status: DC
  Administered 2014-09-26 – 2014-09-30 (×2): via INTRAVENOUS

## 2014-09-25 MED ORDER — FAT EMULSION 20 % IV EMUL
240.0000 mL | INTRAVENOUS | Status: AC
  Administered 2014-09-25: 240 mL via INTRAVENOUS
  Filled 2014-09-25: qty 250

## 2014-09-25 NOTE — Progress Notes (Signed)
Patient ID: Jennifer Sanford, female   DOB: 03/01/1951, 64 y.o.   MRN: [id number]  General Surgery - [facility] - Progress Note  Subjective: Patient up in chair.  Mild nausea, no emesis.  Passing flatus, no BM's.  AXR this AM just completed.  Objective: Vital signs in last 24 hours: Temp:  [98 F (36.7 C)-98.6 F (37 C)] 98 F (36.7 C) (01/03 0556) Pulse Rate:  [56-60] 59 (01/03 0556) Resp:  [18] 18 (01/03 0556) BP: (129-138)/(83-86) 138/83 mmHg (01/03 0556) SpO2:  [100 %] 100 % (01/03 0556) Last BM Date: 09/20/14  Intake/Output from previous day: 01/02 0701 - 01/03 0700 In: 1728.5 [I.V.:212.7; TPN:1515.8] Out: 3050 [Urine:3050]  Exam: HEENT - clear, not icteric Neck - soft Chest - clear bilaterally Cor - RRR, no murmur Abd - soft, minimal distension; BS present; minimal tenderness Ext - no significant edema Neuro - grossly intact, no focal deficits  Lab Results:   Recent Labs  09/23/14 0615  WBC 5.4  HGB 9.1*  HCT 28.2*  PLT 375     Recent Labs  09/24/14 0500 09/25/14 0430  NA 142 139  K 3.5 3.5  CL 107 107  CO2 29 28  GLUCOSE 100* 95  BUN 9 11  CREATININE 0.99 0.93  CALCIUM 8.9 8.7    Studies/Results: Dg Abd 1 View  09/24/2014   CLINICAL DATA:  Small bowel obstruction. Status post distal right ureteral reimplantation on 09/13/2014.  EXAM: ABDOMEN - 1 VIEW  COMPARISON:  09/23/2014  FINDINGS: Stable to mildly more prominent small bowel dilatation with maximal small bowel caliber of approximately 3.7 cm. Contrast remains in the colon. Right-sided ureteral stent shows stable positioning.  IMPRESSION: Stable to mildly more prominent small bowel dilatation consistent with partial small bowel obstruction/ileus. Pattern of oral contrast in the colon is similar to the film yesterday.   Electronically Signed   By: Irish Lack M.D.   On: 09/24/2014 09:36   Dg Abd 1 View  09/23/2014   CLINICAL DATA:  Small bowel obstruction.  Continued abdominal  pain.  EXAM: ABDOMEN - 1 VIEW  COMPARISON:  09/22/2014  FINDINGS: Oral contrast material again noted within the: . Continued mildly prominent small bowel loops, similar to prior study. No free air. No organomegaly. Right ureteral stent remains in place, unchanged.  IMPRESSION: Continued partial small bowel obstruction pattern. No significant change.   Electronically Signed   By: Charlett Nose M.D.   On: 09/23/2014 09:37    Assessment / Plan: 1. Small bowel obstruction  AXR this AM improved with less SB distension, gas in colon Encouraged ambulation, OOB Will trial clear liquid diet today 2. Right distal ureteral stricture, OPEN RIGHT URETERAL REIMPLANT on 09/13/14.  Velora Heckler, MD, [facility] Office: [phone]  09/25/2014

## 2014-09-25 NOTE — Progress Notes (Addendum)
Admitted on 12/29 with pSBO, s/p open right ureteral reimplant on 09/13/14 with extensive pelvic lysis of adhesions.  Intv: Continues to pass small amt of flatus Nausea improved, pain minimal   PE Filed Vitals:   09/24/14 1452 09/24/14 1823 09/24/14 2155 09/25/14 0556  BP: 129/84 131/86 133/85 138/83  Pulse: 60 58 56 59  Temp: 98.2 F (36.8 C)  98.6 F (37 C) 98 F (36.7 C)  TempSrc: Oral  Oral Oral  Resp: Height:      Weight:      SpO2: 100%  100% 100%    Intake/Output Summary (Last 24 hours) at 09/25/14 0843 Last data filed at 09/25/14 0601  Gross per 24 hour  Intake 1728.5 ml  Output   3050 ml  Net -1321.5 ml   NAD Abdomen is soft Minimal epigastric/upper abdominal tenderness Incision c/d/i Foley with brown/straw colored urine Extremities symmetric   Recent Labs  09/23/14 0615  WBC 5.4  HGB 9.1*  HCT 28.2*    Recent Labs  09/23/14 0615 09/24/14 0500 09/25/14 0430  NA 138 142 139  K 3.7 3.5 3.5  CL 107 107 107  CO2 GLUCOSE 92 100* 95  BUN CREATININE 0.95 0.99 0.93  CALCIUM 8.6 8.9 8.7   No results for input(s): LABPT, INR in the last 72 hours. No results for input(s): LABURIN in the last 72 hours. Results for orders placed or performed during the hospital encounter of 09/20/14  Urine culture     Status: None (Preliminary result)   Collection Time: 09/20/14  4:21 PM  Result Value Ref Range Status   Specimen Description URINE, CLEAN CATCH  Final   Special Requests NONE  Final   Colony Count   Final    >=100,000 COLONIES/ML Performed at [facility]    Culture   Final    GRAM NEGATIVE RODS Performed at [facility]    Report Status PENDING  Incomplete     KUB - some improvement in small bowel distention from yesterday, contrast in colon unchanged  Imp- pSBO from post-surgical adhesions Plan -  Will try sips of clears today and see how well she tolerates it, continue TPN IV  medications Serial KUB and BMP Tomorrow will get cystogram and consider removal of catheter. Appreciate GS input.

## 2014-09-25 NOTE — Progress Notes (Signed)
PARENTERAL NUTRITION CONSULT NOTE  Pharmacy Consult for TPN Indication: Prolonged ileus  No Known Allergies  Patient Measurements: Height:  (177.8 cm) Weight: 202 lb (91.627 kg) IBW/kg (Calculated) : 68.5 Adjusted Body Weight: 75 kg  Vital Signs: Temp: 98 F (36.7 C) (01/03 0556) Temp Source: Oral (01/03 0556) BP: 138/83 mmHg (01/03 0556) Pulse Rate: 59 (01/03 0556) Intake/Output from previous day: 01/02 0701 - 01/03 0700 In: 1728.5 [I.V.:212.7; TPN:1515.8] Out: 3050 [Urine:3050] Intake/Output from this shift:    Labs:  Recent Labs  09/23/14 0615  WBC 5.4  HGB 9.1*  HCT 28.2*  PLT 375     Recent Labs  09/23/14 0615 09/24/14 0500 09/25/14 0430  NA 138 142 139  K 3.7 3.5 3.5  CL 107 107 107  CO2 GLUCOSE 92 100* 95  BUN CREATININE 0.95 0.99 0.93  CALCIUM 8.6 8.9 8.7  MG 1.6 1.6 1.7  PHOS 4.2 4.4 4.7*  PROT 5.9* 6.1 6.3  ALBUMIN 2.8* 3.0* 2.9*  AST ALT ALKPHOS 50 53 55  BILITOT 0.3 0.3 0.3  TRIG 99  --   --    Estimated Creatinine Clearance: 77.9 mL/min (by C-G formula based on Cr of 0.93).    Recent Labs  09/24/14 1803 09/25/14 0002 09/25/14 0555  GLUCAP 99 102* 95    Medical History: Past Medical History  Diagnosis Date  . Hypertension   . Depression   . GERD (gastroesophageal reflux disease)   . Complication of anesthesia     VERY CLAUSTROPHIC W/ MASK  . Ureteral stricture, right   . Hydronephrosis, right   . Hyperlipemia   . OA (osteoarthritis)     Insulin Requirements:  No insulin required yet, No hx DM  Current Nutrition:  CLD started this AM, no PO intake yet charted Clinimix E 5/20 @ 60 ml/hr 20% fat emulsion @ 10 ml/hr  IVF: D5LR @ 55 ml/hr  Central access: R brachial PICC 12/31 TPN start date: 09/23/14   ASSESSMENT                                                                                                          HPI: 12 yoF admitted on 12/29 with pSBO, s/p open  right ureteral reimplant on 09/13/14 with extensive pelvic lysis of adhesions. Urology and CCS managing; suspecting will resolve without operative management. PICC placed 12/31 and pharmacy consulted to start TPN for prolonged ileus.    Significant events:  1/2-3: pt passing flatus, less abdominal pain, no BMs yet  Today: 09/25/2014  Glucose - at goal < 150  Electrolytes - K at low end of WNL at 3.5, Mag at low end of WNL at 1.7, all others WNL  Renal - stable, no issues noted  LFTs - WNL  TGs - 99 (1/1)  Prealbumin - pending  NUTRITIONAL GOALS  RD recs:  Kcal: 2100-2300 Protein: 110-120g Fluid: 2.1L/day  Clinimix E5/15 at a goal rate of 156ml/hr + 20% fat emulsion at 34ml/hr to provide: 120g/day protein, 2184Kcal/day.  PLAN  1. At 1800 today:  Change Clinimix-E to 5/15 formulation at 80 mL/hr.  20% fat emulsion at 10 mL/hr.  Plan to advance as tolerated to the goal rate.  TPN to contain multivitamin and standard trace elements daily  Reduce IVF to [facility]  Continiue CBGs q6h and cover with Novolog SSI sensitive-scale. 2. Magnesium 1g IV bolus 3. KCL 10 mEq runs x4 4. TPN lab panels on Mondays & Thursdays. 5. Follow up toleration of CLD 6. Follow daily.  Thank you for the consult.  Ross Ludwig, PharmD, BCPS Pager: [phone] Pharmacy: [phone] 09/25/2014 9:45 AM

## 2014-09-26 ENCOUNTER — Inpatient Hospital Stay (HOSPITAL_COMMUNITY): Payer: 59

## 2014-09-26 LAB — COMPREHENSIVE METABOLIC PANEL
ALT: 23 U/L (ref 0–35)
AST: 32 U/L (ref 0–37)
Albumin: 2.9 g/dL — ABNORMAL LOW (ref 3.5–5.2)
Alkaline Phosphatase: 58 U/L (ref 39–117)
Anion gap: 9 (ref 5–15)
BUN: 16 mg/dL (ref 6–23)
CO2: 25 mmol/L (ref 19–32)
Calcium: 8.7 mg/dL (ref 8.4–10.5)
Chloride: 105 mEq/L (ref 96–112)
Creatinine, Ser: 1 mg/dL (ref 0.50–1.10)
GFR calc Af Amer: 69 mL/min — ABNORMAL LOW (ref >90)
GFR calc non Af Amer: 60 mL/min — ABNORMAL LOW (ref >90)
Glucose, Bld: 87 mg/dL (ref 70–99)
Potassium: 3.8 mmol/L (ref 3.5–5.1)
Sodium: 139 mmol/L (ref 135–145)
Total Bilirubin: 0.4 mg/dL (ref 0.3–1.2)
Total Protein: 6.3 g/dL (ref 6.0–8.3)

## 2014-09-26 LAB — CBC
HCT: 25.4 % — ABNORMAL LOW (ref 36.0–46.0)
Hemoglobin: 8.2 g/dL — ABNORMAL LOW (ref 12.0–15.0)
MCH: 28.7 pg (ref 26.0–34.0)
MCHC: 32.3 g/dL (ref 30.0–36.0)
MCV: 88.8 fL (ref 78.0–100.0)
Platelets: 433 10*3/uL — ABNORMAL HIGH (ref 150–400)
RBC: 2.86 MIL/uL — ABNORMAL LOW (ref 3.87–5.11)
RDW: 13.6 % (ref 11.5–15.5)
WBC: 9.4 10*3/uL (ref 4.0–10.5)

## 2014-09-26 LAB — PHOSPHORUS: Phosphorus: 4.6 mg/dL (ref 2.3–4.6)

## 2014-09-26 LAB — DIFFERENTIAL
Basophils Absolute: 0 10*3/uL (ref 0.0–0.1)
Basophils Relative: 0 % (ref 0–1)
Eosinophils Absolute: 0.5 10*3/uL (ref 0.0–0.7)
Eosinophils Relative: 5 % (ref 0–5)
Lymphocytes Relative: 41 % (ref 12–46)
Lymphs Abs: 3.8 10*3/uL (ref 0.7–4.0)
Monocytes Absolute: 0.8 10*3/uL (ref 0.1–1.0)
Monocytes Relative: 8 % (ref 3–12)
Neutro Abs: 4.3 10*3/uL (ref 1.7–7.7)
Neutrophils Relative %: 46 % (ref 43–77)

## 2014-09-26 LAB — GLUCOSE, CAPILLARY
Glucose-Capillary: 71 mg/dL (ref 70–99)
Glucose-Capillary: 78 mg/dL (ref 70–99)
Glucose-Capillary: 86 mg/dL (ref 70–99)
Glucose-Capillary: 93 mg/dL (ref 70–99)

## 2014-09-26 LAB — PREALBUMIN: Prealbumin: 21.5 mg/dL (ref 17.0–34.0)

## 2014-09-26 LAB — MAGNESIUM: Magnesium: 1.8 mg/dL (ref 1.5–2.5)

## 2014-09-26 LAB — TRIGLYCERIDES: Triglycerides: 141 mg/dL (ref <150)

## 2014-09-26 MED ORDER — TRACE MINERALS CR-CU-F-FE-I-MN-MO-SE-ZN IV SOLN
INTRAVENOUS | Status: AC
  Administered 2014-09-26: 18:00:00 via INTRAVENOUS
  Filled 2014-09-26: qty 2400

## 2014-09-26 MED ORDER — POTASSIUM CHLORIDE 10 MEQ/100ML IV SOLN
10.0000 meq | INTRAVENOUS | Status: AC
  Administered 2014-09-26 (×2): 10 meq via INTRAVENOUS
  Filled 2014-09-26 (×2): qty 100

## 2014-09-26 MED ORDER — PHENAZOPYRIDINE HCL 200 MG PO TABS
200.0000 mg | ORAL_TABLET | Freq: Two times a day (BID) | ORAL | Status: DC | PRN
  Filled 2014-09-26: qty 1

## 2014-09-26 MED ORDER — FAT EMULSION 20 % IV EMUL
240.0000 mL | INTRAVENOUS | Status: AC
  Administered 2014-09-26: 240 mL via INTRAVENOUS
  Filled 2014-09-26: qty 250

## 2014-09-26 NOTE — Progress Notes (Signed)
Subjective: She looks good, knows she did well with cystogram, worried about blood loss via foley.  Objective: Vital signs in last 24 hours: Temp:  [98.1 F (36.7 C)-99.1 F (37.3 C)] 98.3 F (36.8 C) (01/04 1432) Pulse Rate:  [55-78] 60 (01/04 1432) Resp:  [18] 18 (01/04 1432) BP: (112-134)/(77-92) 121/86 mmHg (01/04 1432) SpO2:  [100 %] 100 % (01/04 1432) Last BM Date: 09/25/14 360 PO today, nothing recorded for yesterday.  Diet: clears Afebrile, VSS No BM  recorded Lab OK H/H down some Cystogram today show;  No evidence of bladder leak at the site of right ureteral reimplantation or elsewhere during filling of the bladder lumen to the limit of patient tolerance (280 cc) Abdominal film shows mild distension, still ileus or SBO contrast in the colon.   Intake/Output from previous day: 01/03 0701 - 01/04 0700 In: -  Out: 2900 [Urine:2900] Intake/Output this shift: Total I/O In: 560 [P.O.:360; IV Piggyback:200] Out: 670 [Urine:670]  General appearance: alert, cooperative and no distress GI: soft + BS, not much flatus, no Bm, not very distended.  Comfortable up in chair.  Lab Results:   Recent Labs  09/26/14 0415  WBC 9.4  HGB 8.2*  HCT 25.4*  PLT 433*    BMET  Recent Labs  09/25/14 0430 09/26/14 0415  NA 139 139  K 3.5 3.8  CL 107 105  CO2 28 25  GLUCOSE 95 87  BUN 11 16  CREATININE 0.93 1.00  CALCIUM 8.7 8.7   PT/INR No results for input(s): LABPROT, INR in the last 72 hours.   Recent Labs Lab 09/20/14 1425 09/23/14 0615 09/24/14 0500 09/25/14 0430 09/26/14 0415  AST 37 32  ALT ALKPHOS 72 50 53 55 58  BILITOT 0.3 0.3 0.3 0.3 0.4  PROT 8.4* 5.9* 6.1 6.3 6.3  ALBUMIN 4.0 2.8* 3.0* 2.9* 2.9*     Lipase     Component Value Date/Time   LIPASE 21 09/20/2014 1425     Studies/Results: Dg Abd 1 View  09/26/2014   CLINICAL DATA:  Small bowel obstruction  EXAM: ABDOMEN - 1 VIEW  COMPARISON:  09/25/2014  FINDINGS:  Persistent mild gaseous distended small bowel loops mid and left abdomen probable ileus or partial bowel obstruction. Again noted contrast material within right and transverse colon. Right ureteral stent in place. Midline pelvic wall skin staples.  IMPRESSION: Persistent mild gaseous distended small bowel loops mid and left abdomen probable ileus or partial bowel obstruction. Again noted contrast material within right and transverse colon. Right ureteral stent in place.   Electronically Signed   By: Natasha Mead M.D.   On: 09/26/2014 08:29   Dg Cystogram  09/26/2014   CLINICAL DATA:  Subsequent encounter for Patient with a history of right ureteral injury in 2001 with reimplantation at that time. Subsequent distal stricturing required recent repeat reimplantation.  EXAM: CYSTOGRAM  TECHNIQUE: After catheterization of the urinary bladder following sterile technique the bladder was filled with mL Cysto-Hypaque 30% by drip infusion. Serial spot images were obtained during bladder filling and post draining.  FLUOROSCOPY TIME:  2 min and 10 seconds  COMPARISON:  Abdominal films from 09/25/2014.  FINDINGS: Using the patient's existing Foley catheter, bladder lumen was opacified with water-soluble contrast material using retrograde gravity flow. The bladder was filled to the limit of patient tolerance which was 280 cc. Frontal and both steep oblique views of the bladder were obtained during filling. A slight "  bulge" in in the posterior bladder dome where the right ureteral catheter enters the bladder lumen. This is compatible with the site of reimplantation.  There is no evidence of contrast extravasation in the region of surgery or elsewhere to suggest bladder leak.  IMPRESSION: No evidence of bladder leak at the site of right ureteral reimplantation or elsewhere during filling of the bladder lumen to the limit of patient tolerance (280 cc).   Electronically Signed   By: Kennith Center M.D.   On: 09/26/2014 13:20   Dg  Abd 2 Views  09/25/2014   CLINICAL DATA:  SBO. No abdominal pain today. Nausea. Recent bilateral ureteral stent placement  EXAM: ABDOMEN - 2 VIEW  COMPARISON:  09/24/2014  FINDINGS: Visualized lung bases clear.  No free air.  There a few gas distended small bowel loops in the mid abdomen, decreased in number and degree of distension since previous exam. Progression of enteral contrast in the non distended colon.  Right double-J ureteral stent stable. Skin staples project over the pelvis.  Degenerative spurring in bilateral hips.  IMPRESSION: 1. Improving partial small bowel obstruction/ileus.   Electronically Signed   By: Oley Balm M.D.   On: 09/25/2014 09:08    Medications: . ciprofloxacin  500 mg Oral BID  . heparin subcutaneous  5,000 Units Subcutaneous 3 times per day  . insulin aspart  0-9 Units Subcutaneous Q6H  . pantoprazole (PROTONIX) IV  40 mg Intravenous Daily  . potassium chloride  10 mEq Intravenous Q1 Hr x 2  . triamterene-hydrochlorothiazide  1 tablet Oral Daily    Assessment/Plan SBO Right distal ureteral stricture, S/p OPEN RIGHT URETERAL REIMPLANT; Surgeon: Crist Fat, MD, 09/13/14. Hypertension GERD DEpression Osteoarthritis Hyperlipidemia   She has some stuff coming thru, on film, contrast in colon, but not much in the was of flatus, no BM.  She is tolerating clears, but not taking much.  Continue current rx. I will check labs in AM.    LOS: 6 days    Jennifer Sanford 09/26/2014

## 2014-09-26 NOTE — Progress Notes (Signed)
PARENTERAL NUTRITION CONSULT NOTE - FOLLOW UP  Pharmacy Consult for TPN Indication: Prolonged ileus  No Known Allergies  Patient Measurements: Height:  (177.8 cm) Weight: 202 lb (91.627 kg) IBW/kg (Calculated) : 68.5 Adjusted Body Weight: 75kg  Vital Signs: Temp: 99.1 F (37.3 C) (01/04 0611) Temp Source: Oral (01/04 0611) BP: 117/77 mmHg (01/04 0611) Pulse Rate: 61 (01/04 0611) Intake/Output from previous day: 01/03 0701 - 01/04 0700 In: -  Out: 2900 [Urine:2900]  Labs:  Recent Labs  09/26/14 0415  WBC 9.4  HGB 8.2*  HCT 25.4*  PLT 433*     Recent Labs  09/24/14 0500 09/25/14 0430 09/26/14 0415 09/26/14 0416  NA 142 139 139  --   K 3.5 3.5 3.8  --   CL 107 107 105  --   CO2 --   GLUCOSE 100* 95 87  --   BUN --   CREATININE 0.99 0.93 1.00  --   CALCIUM 8.9 8.7 8.7  --   MG 1.6 1.7 1.8  --   PHOS 4.4 4.7* 4.6  --   PROT 6.1 6.3 6.3  --   ALBUMIN 3.0* 2.9* 2.9*  --   AST 20 25 32  --   ALT --   ALKPHOS 53 55 58  --   BILITOT 0.3 0.3 0.4  --   TRIG  --   --   --  141   Estimated Creatinine Clearance: 72.5 mL/min (by C-G formula based on Cr of 1).    Recent Labs  09/25/14 1701 09/26/14 0010 09/26/14 0609  GLUCAP 79 93 86    Medications:  Infusions:  . dextrose 5% lactated ringers 10 mL/hr at 09/25/14 2125  . Marland KitchenTPN (CLINIMIX-E) Adult 80 mL/hr at 09/25/14 1745   And  . fat emulsion 240 mL (09/25/14 1745)   Insulin Requirements:  Sensitive SSI q6h - none used.  No hx DM.  Current Nutrition:  Clinimix E 5/15 at 80 ml/hr, Lipids at 10 ml/hr Diet: clear liquids (started 1/3)  IVF: D5LR at [facility] access: R brachial PICC (12/31) TPN start date: 09/23/14  ASSESSMENT                                                                                                          HPI: 67 yoF admitted on 12/29 with pSBO, s/p open right ureteral reimplant on 09/13/14 with extensive pelvic lysis of adhesions.  Urology and CCS managing; suspecting will resolve without operative management. PICC placed 12/31 and pharmacy consulted to start TPN for prolonged ileus.   Significant events:  1/3:  Pt passing flatus and had tiny BM.  Tried clears, but had crampy abdominal pain, nausea. 1/4: KUB shows ongoing obstruction  Today, 09/26/2014:   Glucose: 87, remains at goal < 150.    Electrolytes:  WNL, including Mag (Mag 1g 1/3), K (KCl 40 mEq 1/3), Phos, and Corr Ca 9.58  Renal: SCr 1, remains stable and WNL  LFTs: AST/ALT 32/23, remains  stable and WNL   TGs: 99 (1/1), 141 (1/4)  Prealbumin: 17.8 (1/1), pending (1/4)  NUTRITIONAL GOALS                                                                                              RD recs (09/24/14):  2100-2300 kcal/day, 110-120 g protein/day  Clinimix 5/15 at a goal rate of 100 ml/hr + 20% fat emulsion at 10 ml/hr to provide: 120 g/day protein, 2184 Kcal/day.  PLAN                                                                                                                         Now:  KCl 10 mEq runs x2 At 1800 today:  Increase to Clinimix E 5/15 at 100 ml/hr.  Continue 20% fat emulsion at 10 ml/hr.  TPN to contain standard multivitamins and trace elements.  Continue IVF at 10 ml/hr.  Continue CBGs and senstive SSI q6h  TPN lab panels on Mondays & Thursdays.  F/u daily, toleration of CLD.   Lynann Beaver PharmD, BCPS Pager [phone] 09/26/2014 8:24 AM

## 2014-09-26 NOTE — Progress Notes (Signed)
Admitted on 12/29 with pSBO, s/p open right ureteral reimplant on 09/13/14 with extensive pelvic lysis of adhesions.  Intv: Tiny BM yesterday Tried clears and had some crampy abdominal pain Persistent nausea Gross hematuria - no spasms/pain/fevers.  Started on Cipro for Pseudomonas in urine upon admission.   PE Filed Vitals:   09/25/14 0556 09/25/14 1500 09/25/14 2140 09/26/14 0611  BP: 138/83 112/92 134/91 117/77  Pulse: 59 78 55 61  Temp: 98 F (36.7 C) 98.1 F (36.7 C) 98.2 F (36.8 C) 99.1 F (37.3 C)  TempSrc: Oral Oral Oral Oral  Resp: Height:      Weight:      SpO2: 100% 100% 100% 100%    Intake/Output Summary (Last 24 hours) at 09/26/14 0801 Last data filed at 09/26/14 1610  Gross per 24 hour  Intake      0 ml  Output   2900 ml  Net  -2900 ml   NAD Abdomen is soft Minimal epigastric/upper abdominal tenderness Incision c/d/i Foley with grossly bloody urine Extremities symmetric   Recent Labs  09/26/14 0415  WBC 9.4  HGB 8.2*  HCT 25.4*    Recent Labs  09/24/14 0500 09/25/14 0430 09/26/14 0415  NA 142 139 139  K 3.5 3.5 3.8  CL 107 107 105  CO2 GLUCOSE 100* 95 87  BUN CREATININE 0.99 0.93 1.00  CALCIUM 8.9 8.7 8.7   No results for input(s): LABPT, INR in the last 72 hours. No results for input(s): LABURIN in the last 72 hours. Results for orders placed or performed during the hospital encounter of 09/20/14  Urine culture     Status: None   Collection Time: 09/20/14  4:21 PM  Result Value Ref Range Status   Specimen Description URINE, CLEAN CATCH  Final   Special Requests NONE  Final   Colony Count   Final    >=100,000 COLONIES/ML Performed at [facility]    Culture   Final    PSEUDOMONAS AERUGINOSA Performed at [facility]    Report Status 09/25/2014 FINAL  Final   Organism ID, Bacteria PSEUDOMONAS AERUGINOSA  Final      Susceptibility   Pseudomonas aeruginosa - MIC*    CEFEPIME  8 SENSITIVE Sensitive     CEFTAZIDIME 8 SENSITIVE Sensitive     CIPROFLOXACIN 1 SENSITIVE Sensitive     GENTAMICIN <=1 SENSITIVE Sensitive     IMIPENEM 2 SENSITIVE Sensitive     PIP/TAZO 32 SENSITIVE Sensitive     TOBRAMYCIN <=1 SENSITIVE Sensitive     * PSEUDOMONAS AERUGINOSA     KUB - today's xray pending  Imp- pSBO from post-surgical adhesions, anemia which maybe at least in part due to gross hematuria - ?hemorrhagic cystitis. Plan -  Bowel rest, continue TPN IV medications Serial KUB and BMP Cystogram today.  Cipro to cover bacteruria once foley is removed. Appreciate GS input.

## 2014-09-27 ENCOUNTER — Inpatient Hospital Stay (HOSPITAL_COMMUNITY): Payer: 59

## 2014-09-27 LAB — BASIC METABOLIC PANEL
Anion gap: 5 (ref 5–15)
BUN: 23 mg/dL (ref 6–23)
CO2: 27 mmol/L (ref 19–32)
Calcium: 9.1 mg/dL (ref 8.4–10.5)
Chloride: 107 mEq/L (ref 96–112)
Creatinine, Ser: 0.96 mg/dL (ref 0.50–1.10)
GFR calc Af Amer: 72 mL/min — ABNORMAL LOW (ref >90)
GFR calc non Af Amer: 63 mL/min — ABNORMAL LOW (ref >90)
Glucose, Bld: 92 mg/dL (ref 70–99)
Potassium: 4.2 mmol/L (ref 3.5–5.1)
Sodium: 139 mmol/L (ref 135–145)

## 2014-09-27 LAB — TYPE AND SCREEN
ABO/RH(D): O POS
Antibody Screen: NEGATIVE

## 2014-09-27 LAB — CBC
HCT: 30.8 % — ABNORMAL LOW (ref 36.0–46.0)
Hemoglobin: 9.9 g/dL — ABNORMAL LOW (ref 12.0–15.0)
MCH: 28.6 pg (ref 26.0–34.0)
MCHC: 32.1 g/dL (ref 30.0–36.0)
MCV: 89 fL (ref 78.0–100.0)
Platelets: 404 10*3/uL — ABNORMAL HIGH (ref 150–400)
RBC: 3.46 MIL/uL — ABNORMAL LOW (ref 3.87–5.11)
RDW: 13.8 % (ref 11.5–15.5)
WBC: 9 10*3/uL (ref 4.0–10.5)

## 2014-09-27 LAB — GLUCOSE, CAPILLARY
Glucose-Capillary: 103 mg/dL — ABNORMAL HIGH (ref 70–99)
Glucose-Capillary: 84 mg/dL (ref 70–99)
Glucose-Capillary: 87 mg/dL (ref 70–99)
Glucose-Capillary: 99 mg/dL (ref 70–99)

## 2014-09-27 MED ORDER — FAT EMULSION 20 % IV EMUL
240.0000 mL | INTRAVENOUS | Status: AC
  Administered 2014-09-27: 240 mL via INTRAVENOUS
  Filled 2014-09-27: qty 250

## 2014-09-27 MED ORDER — TRACE MINERALS CR-CU-F-FE-I-MN-MO-SE-ZN IV SOLN
INTRAVENOUS | Status: AC
  Administered 2014-09-27: 18:00:00 via INTRAVENOUS
  Filled 2014-09-27: qty 2400

## 2014-09-27 MED ORDER — DIPHENHYDRAMINE HCL 25 MG PO CAPS
25.0000 mg | ORAL_CAPSULE | Freq: Four times a day (QID) | ORAL | Status: DC | PRN
  Administered 2014-09-28 – 2014-09-30 (×4): 25 mg via ORAL
  Filled 2014-09-27 (×4): qty 1

## 2014-09-27 NOTE — Progress Notes (Signed)
PARENTERAL NUTRITION CONSULT NOTE - FOLLOW UP  Pharmacy Consult for TPN Indication: Prolonged ileus  No Known Allergies  Patient Measurements: Height:  (177.8 cm) Weight: 202 lb (91.627 kg) IBW/kg (Calculated) : 68.5 Adjusted Body Weight: 75kg  Vital Signs: Temp: 98.1 F (36.7 C) (01/05 0600) Temp Source: Oral (01/05 0600) BP: 124/80 mmHg (01/05 0600) Pulse Rate: 61 (01/05 0600) Intake/Output from previous day: 01/04 0701 - 01/05 0700 In: 1858 [P.O.:600; I.V.:210; IV Piggyback:200; TPN:848] Out: 2170 [Urine:2170]  Labs:  Recent Labs  09/26/14 0415 09/27/14 0555  WBC 9.4 9.0  HGB 8.2* 9.9*  HCT 25.4* 30.8*  PLT 433* 404*     Recent Labs  09/25/14 0430 09/26/14 0415 09/26/14 0416 09/27/14 0555  NA 139 139  --  139  K 3.5 3.8  --  4.2  CL 107 105  --  107  CO2 28 25  --  27  GLUCOSE 95 87  --  92  BUN 11 16  --  23  CREATININE 0.93 1.00  --  0.96  CALCIUM 8.7 8.7  --  9.1  MG 1.7 1.8  --   --   PHOS 4.7* 4.6  --   --   PROT 6.3 6.3  --   --   ALBUMIN 2.9* 2.9*  --   --   AST 25 32  --   --   ALT 16 23  --   --   ALKPHOS 55 58  --   --   BILITOT 0.3 0.4  --   --   PREALBUMIN  --  21.5  --   --   TRIG  --   --  141  --    Estimated Creatinine Clearance: 75.5 mL/min (by C-G formula based on Cr of 0.96).    Recent Labs  09/27/14 09/27/14 0600 09/27/14 1147  GLUCAP 103* 84 99    Medications:  Infusions:  . dextrose 5% lactated ringers 10 mL/hr at 09/26/14 1247  . Marland KitchenTPN (CLINIMIX-E) Adult 100 mL/hr at 09/26/14 1824   And  . fat emulsion 240 mL (09/26/14 1825)  . Marland KitchenTPN (CLINIMIX-E) Adult     And  . fat emulsion     Insulin Requirements:  Sensitive SSI q6h - none used.  No hx DM.  Current Nutrition:  Clinimix E 5/15 at 100 ml/hr, Lipids at 10 ml/hr Diet: clear liquids (started 1/3)  IVF: D5LR at [facility] access: R brachial PICC (12/31) TPN start date: 09/23/14  ASSESSMENT                                                                                                           HPI: 21 yoF admitted on 12/29 with pSBO, s/p open right ureteral reimplant on 09/13/14 with extensive pelvic lysis of adhesions. Urology and CCS managing; suspecting will resolve without operative management. PICC placed 12/31 and pharmacy consulted to start TPN for prolonged ileus.   Significant events:  1/3:  Pt passing flatus and had tiny BM.  Tried clears, but had crampy abdominal  pain, nausea. 1/4: KUB shows ongoing obstruction 1/5: Less nausea, no pain or cramping ~ 36 hours, tolerating jello/cranberry juice without emesis per surgery. FC removed after cystogram showed no bladder leak. Abd x-ray shows stable mildly dilated small bowel loops noted centrally suggesting focal ileus or partial small bowel obstruction.  Today, 09/27/2014:   Glucose: CBGs remain at goal < 150.    Electrolytes:  WNL  Renal: SCr 0.96, remains stable and WNL  LFTs: AST/ALT remains stable and WNL   TGs: 99 (1/1), 141 (1/4)  Prealbumin: 17.8 (1/1), 21.5 (1/4)  NUTRITIONAL GOALS                                                                                              RD recs (09/24/14):  2100-2300 kcal/day, 110-120 g protein/day  Clinimix 5/15 at a goal rate of 100 ml/hr + 20% fat emulsion at 10 ml/hr to provide: 120 g/day protein, 2184 Kcal/day.  PLAN                                                                                                                         At 1800 today:  Continue Clinimix E 5/15 at goal rate of 100 ml/hr.  Continue 20% fat emulsion at 10 ml/hr.  TPN to contain standard multivitamins and trace elements.  Continue IVF at 10 ml/hr.  Continue CBGs and sensitive SSI q6h.  BMET, Mag, Phos in AM.  TPN lab panels on Mondays & Thursdays.  F/u daily, toleration of CLD.   Greer PickerelJigna Darnice Comrie, PharmD, BCPS Pager: [phone] 09/27/2014 12:50 PM

## 2014-09-27 NOTE — Progress Notes (Signed)
Admitted on 12/29 with pSBO, s/p open right ureteral reimplant on 09/13/14 with extensive pelvic lysis of adhesions.  Intv: Foley catheter removed after cystogram showed no bladder leak.  Patient voiding without issue - passing lots of clots.  No dysuria. Less nausea today, no pain or cramping for ~36 hrs.  Tolerated jello and glass of cranberry juice without emesis.  Receiving Zofran 4mg  IV twice daily. On Cipro for Pseudomonas in urine obtain upon admission.   PE Filed Vitals:   09/26/14 1258 09/26/14 1432 09/26/14 2132 09/27/14 0600  BP: 124/86 121/86 115/75 124/80  Pulse: 60 60 60 61  Temp:  98.3 F (36.8 C) 98 F (36.7 C) 98.1 F (36.7 C)  TempSrc:  Oral Oral Oral  Resp:  18 18 18   Height:      Weight:      SpO2:  100% 100% 100%    Intake/Output Summary (Last 24 hours) at 09/27/14 0752 Last data filed at 09/27/14 0700  Gross per 24 hour  Intake   1618 ml  Output   2170 ml  Net   -552 ml   NAD Abdomen is soft Point tenderness above incision in midline - less than prior Incision c/d/i Foley with grossly bloody urine Extremities symmetric   Recent Labs  09/26/14 0415  WBC 9.4  HGB 8.2*  HCT 25.4*    Recent Labs  09/25/14 0430 09/26/14 0415 09/27/14 0555  NA 139 139 139  K 3.5 3.8 4.2  CL 107 105 107  CO2 28 25 27   GLUCOSE 95 87 92  BUN 11 16 23   CREATININE 0.93 1.00 0.96  CALCIUM 8.7 8.7 9.1   No results for input(s): LABPT, INR in the last 72 hours. No results for input(s): LABURIN in the last 72 hours. Results for orders placed or performed during the hospital encounter of 09/20/14  Urine culture     Status: None   Collection Time: 09/20/14  4:21 PM  Result Value Ref Range Status   Specimen Description URINE, CLEAN CATCH  Final   Special Requests NONE  Final   Colony Count   Final    >=100,000 COLONIES/ML Performed at [facility]    Culture   Final    PSEUDOMONAS AERUGINOSA Performed at [facility]    Report Status  09/25/2014 FINAL  Final   Organism ID, Bacteria PSEUDOMONAS AERUGINOSA  Final      Susceptibility   Pseudomonas aeruginosa - MIC*    CEFEPIME 8 SENSITIVE Sensitive     CEFTAZIDIME 8 SENSITIVE Sensitive     CIPROFLOXACIN 1 SENSITIVE Sensitive     GENTAMICIN <=1 SENSITIVE Sensitive     IMIPENEM 2 SENSITIVE Sensitive     PIP/TAZO 32 SENSITIVE Sensitive     TOBRAMYCIN <=1 SENSITIVE Sensitive     * PSEUDOMONAS AERUGINOSA     KUB - today's xray pending, yesterday's film showed little change from prior.  Imp- pSBO from post-surgical adhesions, anemia which maybe at least in part due to gross hematuria - ?hemorrhagic cystitis. Plan -  Clear liquids, continue TPN IV medications Serial KUB and BMP Cipro to cover bacteruria now that foley is out, will continue for 5 days. Will remove staples this PM Appreciate GS input.

## 2014-09-27 NOTE — Progress Notes (Signed)
Subjective: She looks great having some gas, Ok with her clears.  Film results below.  Objective: Vital signs in last 24 hours: Temp:  [98 F (36.7 C)-98.3 F (36.8 C)] 98.1 F (36.7 C) (01/05 0600) Pulse Rate:  [60-61] 61 (01/05 0600) Resp:  [18] 18 (01/05 0600) BP: (115-124)/(75-86) 124/80 mmHg (01/05 0600) SpO2:  [100 %] 100 % (01/05 0600) Last BM Date: 09/20/14 600 PO No BM recorded Afebrile, VSS Labs OK , K+4.2 Film shows contrast right and transverse colon moving toward descending, just not there yet. Intake/Output from previous day: 01/04 0701 - 01/05 0700 In: 1858 [P.O.:600; I.V.:210; IV Piggyback:200; TPN:848] Out: 2170 [Urine:2170] Intake/Output this shift:    General appearance: alert, cooperative and no distress Resp: clear to auscultation bilaterally GI: soft + Bs, feels better having flatus.  Lab Results:   Recent Labs  09/26/14 0415 09/27/14 0555  WBC 9.4 9.0  HGB 8.2* 9.9*  HCT 25.4* 30.8*  PLT 433* 404*    BMET  Recent Labs  09/26/14 0415 09/27/14 0555  NA 139 139  K 3.8 4.2  CL 105 107  CO2 25 27  GLUCOSE 87 92  BUN 16 23  CREATININE 1.00 0.96  CALCIUM 8.7 9.1   PT/INR No results for input(s): LABPROT, INR in the last 72 hours.   Recent Labs Lab 09/20/14 1425 09/23/14 0615 09/24/14 0500 09/25/14 0430 09/26/14 0415  AST 37 18 20 25  32  ALT 22 14 14 16 23   ALKPHOS 72 50 53 55 58  BILITOT 0.3 0.3 0.3 0.3 0.4  PROT 8.4* 5.9* 6.1 6.3 6.3  ALBUMIN 4.0 2.8* 3.0* 2.9* 2.9*     Lipase     Component Value Date/Time   LIPASE 21 09/20/2014 1425     Studies/Results: Dg Abd 1 View  09/27/2014   CLINICAL DATA:  Small bowel obstruction.  EXAM: ABDOMEN - 1 VIEW  COMPARISON:  September 26, 2014.  FINDINGS: No colonic dilatation is noted. Stable mildly dilated small bowel loops are noted centrally within the abdomen suggesting focal ileus or partial small bowel obstruction. Right-sided ureteral stent is unchanged in position. Residual  contrast is again noted in the right and transverse colon. Surgical staples are noted midline in the pelvis.  IMPRESSION: Stable mildly dilated small bowel loops noted centrally suggesting focal ileus or partial small bowel obstruction.   Electronically Signed   By: Roque LiasJames  Green M.D.   On: 09/27/2014 08:39   Dg Abd 1 View  09/26/2014   CLINICAL DATA:  Small bowel obstruction  EXAM: ABDOMEN - 1 VIEW  COMPARISON:  09/25/2014  FINDINGS: Persistent mild gaseous distended small bowel loops mid and left abdomen probable ileus or partial bowel obstruction. Again noted contrast material within right and transverse colon. Right ureteral stent in place. Midline pelvic wall skin staples.  IMPRESSION: Persistent mild gaseous distended small bowel loops mid and left abdomen probable ileus or partial bowel obstruction. Again noted contrast material within right and transverse colon. Right ureteral stent in place.   Electronically Signed   By: Natasha MeadLiviu  Pop M.D.   On: 09/26/2014 08:29   Dg Cystogram  09/26/2014   CLINICAL DATA:  Subsequent encounter for Patient with a history of right ureteral injury in 2001 with reimplantation at that time. Subsequent distal stricturing required recent repeat reimplantation.  EXAM: CYSTOGRAM  TECHNIQUE: After catheterization of the urinary bladder following sterile technique the bladder was filled with mL Cysto-Hypaque 30% by drip infusion. Serial spot images were obtained during bladder  filling and post draining.  FLUOROSCOPY TIME:  2 min and 10 seconds  COMPARISON:  Abdominal films from 09/25/2014.  FINDINGS: Using the patient's existing Foley catheter, bladder lumen was opacified with water-soluble contrast material using retrograde gravity flow. The bladder was filled to the limit of patient tolerance which was 280 cc. Frontal and both steep oblique views of the bladder were obtained during filling. A slight "bulge" in in the posterior bladder dome where the right ureteral catheter enters  the bladder lumen. This is compatible with the site of reimplantation.  There is no evidence of contrast extravasation in the region of surgery or elsewhere to suggest bladder leak.  IMPRESSION: No evidence of bladder leak at the site of right ureteral reimplantation or elsewhere during filling of the bladder lumen to the limit of patient tolerance (280 cc).   Electronically Signed   By: Kennith Center M.D.   On: 09/26/2014 13:20    Medications: . ciprofloxacin  500 mg Oral BID  . heparin subcutaneous  5,000 Units Subcutaneous 3 times per day  . insulin aspart  0-9 Units Subcutaneous Q6H  . pantoprazole (PROTONIX) IV  40 mg Intravenous Daily  . triamterene-hydrochlorothiazide  1 tablet Oral Daily    Assessment/Plan SBO Right distal ureteral stricture, S/p OPEN RIGHT URETERAL REIMPLANT; Surgeon: Crist Fat, MD, 09/13/14. Hypertension GERD DEpression Osteoarthritis Hyperlipidemia   Plan:  Continue current conservative care.  Consider suppository tomorrow if she is still at the same place.      LOS: 7 days    Arielys Wandersee 09/27/2014

## 2014-09-28 LAB — MAGNESIUM: Magnesium: 1.8 mg/dL (ref 1.5–2.5)

## 2014-09-28 LAB — BASIC METABOLIC PANEL
Anion gap: 7 (ref 5–15)
BUN: 26 mg/dL — ABNORMAL HIGH (ref 6–23)
CO2: 27 mmol/L (ref 19–32)
Calcium: 9.2 mg/dL (ref 8.4–10.5)
Chloride: 106 mEq/L (ref 96–112)
Creatinine, Ser: 0.94 mg/dL (ref 0.50–1.10)
GFR calc Af Amer: 74 mL/min — ABNORMAL LOW (ref >90)
GFR calc non Af Amer: 64 mL/min — ABNORMAL LOW (ref >90)
Glucose, Bld: 92 mg/dL (ref 70–99)
Potassium: 4 mmol/L (ref 3.5–5.1)
Sodium: 140 mmol/L (ref 135–145)

## 2014-09-28 LAB — GLUCOSE, CAPILLARY
Glucose-Capillary: 84 mg/dL (ref 70–99)
Glucose-Capillary: 87 mg/dL (ref 70–99)
Glucose-Capillary: 88 mg/dL (ref 70–99)
Glucose-Capillary: 99 mg/dL (ref 70–99)

## 2014-09-28 LAB — PHOSPHORUS: Phosphorus: 4.6 mg/dL (ref 2.3–4.6)

## 2014-09-28 MED ORDER — TRACE MINERALS CR-CU-F-FE-I-MN-MO-SE-ZN IV SOLN
INTRAVENOUS | Status: AC
  Administered 2014-09-28: 18:00:00 via INTRAVENOUS
  Filled 2014-09-28: qty 2400

## 2014-09-28 MED ORDER — BISACODYL 10 MG RE SUPP: 10.0000 mg | Freq: Every day | RECTAL | Status: DC | PRN

## 2014-09-28 MED ORDER — FAT EMULSION 20 % IV EMUL
240.0000 mL | INTRAVENOUS | Status: AC
  Administered 2014-09-28: 240 mL via INTRAVENOUS
  Filled 2014-09-28: qty 250

## 2014-09-28 MED ORDER — MAGNESIUM SULFATE IN D5W 10-5 MG/ML-% IV SOLN
1.0000 g | Freq: Once | INTRAVENOUS | Status: AC
  Administered 2014-09-28: 1 g via INTRAVENOUS
  Filled 2014-09-28: qty 100

## 2014-09-28 NOTE — Progress Notes (Addendum)
Admitted on 12/29 with pSBO, s/p open right ureteral reimplant on 09/13/14 with extensive pelvic lysis of adhesions.  Intv: Large BM yesterday with suppository Advanced to full liquids for dinner - tolerated grits and pudding Abdomen softer, less nausea Persistent hematuria  PE Filed Vitals:   09/27/14 0600 09/27/14 1406 09/27/14 2115 09/28/14 0613  BP: 124/80 92/65 121/82 109/70  Pulse: 61 64 64 59  Temp: 98.1 F (36.7 C) 97.9 F (36.6 C) 97.4 F (36.3 C) 98.1 F (36.7 C)  TempSrc: Oral Oral Oral Oral  Resp: 18 18 18 18   Height:      Weight:      SpO2: 100% 95% 99% 100%    Intake/Output Summary (Last 24 hours) at 09/28/14 0715 Last data filed at 09/28/14 0654  Gross per 24 hour  Intake    600 ml  Output   3275 ml  Net  -2675 ml   NAD Abdomen is soft Minimal abdominal tenderness Incision c/d/i Foley with grossly bloody urine Extremities symmetric   Recent Labs  09/26/14 0415 09/27/14 0555  WBC 9.4 9.0  HGB 8.2* 9.9*  HCT 25.4* 30.8*    Recent Labs  09/26/14 0415 09/27/14 0555  NA 139 139  K 3.8 4.2  CL 105 107  CO2 25 27  GLUCOSE 87 92  BUN 16 23  CREATININE 1.00 0.96  CALCIUM 8.7 9.1   No results for input(s): LABPT, INR in the last 72 hours. No results for input(s): LABURIN in the last 72 hours. Results for orders placed or performed during the hospital encounter of 09/20/14  Urine culture     Status: None   Collection Time: 09/20/14  4:21 PM  Result Value Ref Range Status   Specimen Description URINE, CLEAN CATCH  Final   Special Requests NONE  Final   Colony Count   Final    >=100,000 COLONIES/ML Performed at [facility]    Culture   Final    PSEUDOMONAS AERUGINOSA Performed at [facility]    Report Status 09/25/2014 FINAL  Final   Organism ID, Bacteria PSEUDOMONAS AERUGINOSA  Final      Susceptibility   Pseudomonas aeruginosa - MIC*    CEFEPIME 8 SENSITIVE Sensitive     CEFTAZIDIME 8 SENSITIVE Sensitive    CIPROFLOXACIN 1 SENSITIVE Sensitive     GENTAMICIN <=1 SENSITIVE Sensitive     IMIPENEM 2 SENSITIVE Sensitive     PIP/TAZO 32 SENSITIVE Sensitive     TOBRAMYCIN <=1 SENSITIVE Sensitive     * PSEUDOMONAS AERUGINOSA     KUB - no xray today  Imp- resolving post-op pSBO secondary to adhesions.   Plan -  Full liquids with potential advance this PM to regular diet, taper TPN, potentially stop tomorrow PM. IV medications Repeat urine culture to ensure bacturia has cleared Repeat dulculax supp today. Appreciate GS input.

## 2014-09-28 NOTE — Progress Notes (Signed)
Subjective: Feels better walking and making some progress.  Small BM with suppository.  Objective: Vital signs in last 24 hours: Temp:  [97.4 F (36.3 C)-98.1 F (36.7 C)] 98.1 F (36.7 C) (01/06 1610) Pulse Rate:  [59-64] 59 (01/06 0613) Resp:  [18] 18 (01/06 0613) BP: (92-121)/(65-82) 109/70 mmHg (01/06 0613) SpO2:  [95 %-100 %] 100 % (01/06 9604) Last BM Date: 09/28/14  Intake/Output from previous day: 01/05 0701 - 01/06 0700 In: 600 [P.O.:600] Out: 3275 [Urine:3275] Intake/Output this shift: Total I/O In: 240 [P.O.:240] Out: 400 [Urine:400]  General appearance: alert, cooperative and no distress GI: soft, non-tender; bowel sounds normal; no masses,  no organomegaly  Lab Results:   Recent Labs  09/26/14 0415 09/27/14 0555  WBC 9.4 9.0  HGB 8.2* 9.9*  HCT 25.4* 30.8*  PLT 433* 404*    BMET  Recent Labs  09/27/14 0555 09/28/14 0540  NA 139 140  K 4.2 4.0  CL 107 106  CO2 27 27  GLUCOSE 92 92  BUN 23 26*  CREATININE 0.96 0.94  CALCIUM 9.1 9.2   PT/INR No results for input(s): LABPROT, INR in the last 72 hours.   Recent Labs Lab 09/23/14 0615 09/24/14 0500 09/25/14 0430 09/26/14 0415  AST 32  ALT ALKPHOS 50 53 55 58  BILITOT 0.3 0.3 0.3 0.4  PROT 5.9* 6.1 6.3 6.3  ALBUMIN 2.8* 3.0* 2.9* 2.9*     Lipase     Component Value Date/Time   LIPASE 21 09/20/2014 1425     Studies/Results: Dg Abd 1 View  09/27/2014   CLINICAL DATA:  Small bowel obstruction.  EXAM: ABDOMEN - 1 VIEW  COMPARISON:  September 26, 2014.  FINDINGS: No colonic dilatation is noted. Stable mildly dilated small bowel loops are noted centrally within the abdomen suggesting focal ileus or partial small bowel obstruction. Right-sided ureteral stent is unchanged in position. Residual contrast is again noted in the right and transverse colon. Surgical staples are noted midline in the pelvis.  IMPRESSION: Stable mildly dilated small bowel loops noted centrally  suggesting focal ileus or partial small bowel obstruction.   Electronically Signed   By: Roque Lias M.D.   On: 09/27/2014 08:39   Dg Cystogram  09/26/2014   CLINICAL DATA:  Subsequent encounter for Patient with a history of right ureteral injury in 2001 with reimplantation at that time. Subsequent distal stricturing required recent repeat reimplantation.  EXAM: CYSTOGRAM  TECHNIQUE: After catheterization of the urinary bladder following sterile technique the bladder was filled with mL Cysto-Hypaque 30% by drip infusion. Serial spot images were obtained during bladder filling and post draining.  FLUOROSCOPY TIME:  2 min and 10 seconds  COMPARISON:  Abdominal films from 09/25/2014.  FINDINGS: Using the patient's existing Foley catheter, bladder lumen was opacified with water-soluble contrast material using retrograde gravity flow. The bladder was filled to the limit of patient tolerance which was 280 cc. Frontal and both steep oblique views of the bladder were obtained during filling. A slight "bulge" in in the posterior bladder dome where the right ureteral catheter enters the bladder lumen. This is compatible with the site of reimplantation.  There is no evidence of contrast extravasation in the region of surgery or elsewhere to suggest bladder leak.  IMPRESSION: No evidence of bladder leak at the site of right ureteral reimplantation or elsewhere during filling of the bladder lumen to the limit of patient tolerance (280 cc).   Electronically Signed  By: Kennith CenterEric  Mansell M.D.   On: 09/26/2014 13:20    Medications: . ciprofloxacin  500 mg Oral BID  . heparin subcutaneous  5,000 Units Subcutaneous 3 times per day  . insulin aspart  0-9 Units Subcutaneous Q6H  . pantoprazole (PROTONIX) IV  40 mg Intravenous Daily  . triamterene-hydrochlorothiazide  1 tablet Oral Daily    Assessment/Plan SBO Right distal ureteral stricture, S/p OPEN RIGHT URETERAL REIMPLANT; Surgeon: Crist FatBenjamin W Herrick, MD,  09/13/14. Hypertension GERD DEpression Osteoarthritis Hyperlipidemia    Plan:  Try full liquids and see how she does.     LOS: 8 days    JENNINGS,WILLARD 09/28/2014

## 2014-09-28 NOTE — Progress Notes (Signed)
PARENTERAL NUTRITION CONSULT NOTE - FOLLOW UP  Pharmacy Consult for TPN Indication: Prolonged ileus  No Known Allergies  Patient Measurements: Height: 5\' 10"  (177.8 cm) Weight: 202 lb (91.627 kg) IBW/kg (Calculated) : 68.5 Adjusted Body Weight: 75kg  Vital Signs: Temp: 98.1 F (36.7 C) (01/06 0613) Temp Source: Oral (01/06 16100613) BP: 109/70 mmHg (01/06 96040613) Pulse Rate: 59 (01/06 0613) Intake/Output from previous day: 01/05 0701 - 01/06 0700 In: 600 [P.O.:600] Out: 3275 [Urine:3275]  Labs:  Recent Labs  09/26/14 0415 09/27/14 0555  WBC 9.4 9.0  HGB 8.2* 9.9*  HCT 25.4* 30.8*  PLT 433* 404*     Recent Labs  09/26/14 0415 09/26/14 0416 09/27/14 0555 09/28/14 0540  NA 139  --  139 140  K 3.8  --  4.2 4.0  CL 105  --  107 106  CO2 25  --  27 27  GLUCOSE 87  --  92 92  BUN 16  --  23 26*  CREATININE 1.00  --  0.96 0.94  CALCIUM 8.7  --  9.1 9.2  MG 1.8  --   --  1.8  PHOS 4.6  --   --  4.6  PROT 6.3  --   --   --   ALBUMIN 2.9*  --   --   --   AST 32  --   --   --   ALT 23  --   --   --   ALKPHOS 58  --   --   --   BILITOT 0.4  --   --   --   PREALBUMIN 21.5  --   --   --   TRIG  --  141  --   --    Estimated Creatinine Clearance: 77.1 mL/min (by C-G formula based on Cr of 0.94).    Recent Labs  09/27/14 1757 09/27/14 2347 09/28/14 0603  GLUCAP 87 88 99    Medications:  Infusions:  . dextrose 5% lactated ringers 10 mL/hr at 09/26/14 1247  . Marland Kitchen.TPN (CLINIMIX-E) Adult 100 mL/hr at 09/27/14 1806   And  . fat emulsion 240 mL (09/27/14 1805)  . Marland Kitchen.TPN (CLINIMIX-E) Adult     And  . fat emulsion     Insulin Requirements:  Sensitive SSI q6h - none used.  No hx DM.  Current Nutrition:  Clinimix E 5/15 at 100 ml/hr, Lipids at 10 ml/hr Diet: clear liquids (started 1/3)  [facility]  Central access: R brachial PICC (12/31) TPN start date: 09/23/14  ASSESSMENT                                                                                                           HPI: [id number] yoF admitted on 12/29 with pSBO, s/p open right ureteral reimplant on 09/13/14 with extensive pelvic lysis of adhesions. Urology and CCS managing; suspecting will resolve without operative management. PICC placed 12/31 and pharmacy consulted to start TPN for prolonged ileus.   Significant events:  1/3:  Pt passing flatus and had  tiny BM.  Tried clears, but had crampy abdominal pain, nausea. 1/4: KUB shows ongoing obstruction 1/5: Less nausea, no pain or cramping ~ 36 hours, tolerating jello/cranberry juice without emesis per surgery. FC removed after cystogram showed no bladder leak. Abd x-ray shows stable mildly dilated small bowel loops noted centrally suggesting focal ileus or partial small bowel obstruction. 1/6: Less nausea, no PRN zofran usage since 1/4. Tolerating small amt of clears. No BM, + flatus.  Today, 09/28/2014:   Glucose: CBGs remain at goal < 150.    Electrolytes: all WNL, Mag on low end of normal, 1.8   Renal: SCr 0.94, remains stable and WNL  LFTs: AST/ALT (1/4) remains stable and WNL   TGs: 99 (1/1), 141 (1/4)  Prealbumin: 17.8 (1/1), 21.5 (1/4)  NUTRITIONAL GOALS                                                                                              RD recs (09/24/14):  2100-2300 kcal/day, 110-120 g protein/day  Clinimix 5/15 at a goal rate of 100 ml/hr + 20% fat emulsion at 10 ml/hr to provide: 120 g/day protein, 2184 Kcal/day.  PLAN                                                                                                                         At 1000 today:  Magnesium Sulfate 1 gram IV x 1 dose At 1800 today:  Continue Clinimix E 5/15 at goal rate of 100 ml/hr.  Continue 20% fat emulsion at 10 ml/hr.  TPN to contain standard multivitamins and trace elements.  Continue IVF at 10 ml/hr.  Continue CBGs and sensitive SSI q6h.  CMET, Mag, Phos in AM.  TPN lab panels on Mondays & Thursdays.  F/u daily,  toleration of CLD.   Greer Pickerel, PharmD, BCPS Pager: [phone] 09/28/2014 8:11 AM

## 2014-09-29 LAB — COMPREHENSIVE METABOLIC PANEL
ALT: 26 U/L (ref 0–35)
AST: 25 U/L (ref 0–37)
Albumin: 3.2 g/dL — ABNORMAL LOW (ref 3.5–5.2)
Alkaline Phosphatase: 63 U/L (ref 39–117)
Anion gap: 6 (ref 5–15)
BUN: 27 mg/dL — ABNORMAL HIGH (ref 6–23)
CO2: 28 mmol/L (ref 19–32)
Calcium: 9.2 mg/dL (ref 8.4–10.5)
Chloride: 107 mEq/L (ref 96–112)
Creatinine, Ser: 0.97 mg/dL (ref 0.50–1.10)
GFR calc Af Amer: 72 mL/min — ABNORMAL LOW (ref >90)
GFR calc non Af Amer: 62 mL/min — ABNORMAL LOW (ref >90)
Glucose, Bld: 106 mg/dL — ABNORMAL HIGH (ref 70–99)
Potassium: 4.2 mmol/L (ref 3.5–5.1)
Sodium: 141 mmol/L (ref 135–145)
Total Bilirubin: 0.3 mg/dL (ref 0.3–1.2)
Total Protein: 6.6 g/dL (ref 6.0–8.3)

## 2014-09-29 LAB — MAGNESIUM: Magnesium: 1.8 mg/dL (ref 1.5–2.5)

## 2014-09-29 LAB — GLUCOSE, CAPILLARY
Glucose-Capillary: 105 mg/dL — ABNORMAL HIGH (ref 70–99)
Glucose-Capillary: 84 mg/dL (ref 70–99)
Glucose-Capillary: 88 mg/dL (ref 70–99)
Glucose-Capillary: 95 mg/dL (ref 70–99)

## 2014-09-29 LAB — PHOSPHORUS: Phosphorus: 4.6 mg/dL (ref 2.3–4.6)

## 2014-09-29 MED ORDER — FAT EMULSION 20 % IV EMUL
240.0000 mL | INTRAVENOUS | Status: AC
Start: 2014-09-29 — End: 2014-09-30
  Administered 2014-09-29: 240 mL via INTRAVENOUS
  Filled 2014-09-29: qty 250

## 2014-09-29 MED ORDER — BOOST / RESOURCE BREEZE PO LIQD
1.0000 | Freq: Three times a day (TID) | ORAL | Status: DC
  Administered 2014-09-30 – 2014-10-01 (×3): 1 via ORAL

## 2014-09-29 MED ORDER — TRACE MINERALS CR-CU-F-FE-I-MN-MO-SE-ZN IV SOLN
INTRAVENOUS | Status: AC
  Administered 2014-09-29: 18:00:00 via INTRAVENOUS
  Filled 2014-09-29: qty 2400

## 2014-09-29 NOTE — Progress Notes (Signed)
PARENTERAL NUTRITION CONSULT NOTE - FOLLOW UP  Pharmacy Consult for TPN Indication: Prolonged ileus  No Known Allergies  Patient Measurements: Height: '5\' 10"'  (177.8 cm) Weight: 202 lb (91.627 kg) IBW/kg (Calculated) : 68.5 Adjusted Body Weight: 75kg  Intake/Output from previous day: 01/06 0701 - 01/07 0700 In: 360 [P.O.:360] Out: 2650 [Urine:2650] Intake/Output from this shift:    Labs:  Recent Labs  09/27/14 0555  WBC 9.0  HGB 9.9*  HCT 30.8*  PLT 404*    Recent Labs  09/27/14 0555 09/28/14 0540 09/29/14 0432  NA 139 140 141  K 4.2 4.0 4.2  CL 107 106 107  CO2 '27 27 28  ' GLUCOSE 92 92 106*  BUN 23 26* 27*  CREATININE 0.96 0.94 0.97  CALCIUM 9.1 9.2 9.2  MG  --  1.8 1.8  PHOS  --  4.6 4.6  PROT  --   --  6.6  ALBUMIN  --   --  3.2*  AST  --   --  25  ALT  --   --  26  ALKPHOS  --   --  63  BILITOT  --   --  0.3   Estimated Creatinine Clearance: 74.7 mL/min (by C-G formula based on Cr of 0.97).    Recent Labs  09/28/14 1656 09/29/14 09/29/14 0621  GLUCAP 87 95 105*    Medications:  Infusions:  . dextrose 5% lactated ringers 10 mL/hr at 09/26/14 1247  . Marland KitchenTPN (CLINIMIX-E) Adult 100 mL/hr at 09/28/14 1737   And  . fat emulsion 240 mL (09/28/14 1738)    Insulin Requirements:  Sensitive SSI q6h - none used   Current Nutrition:  Clinimix and Lipids at goal rate 100 ml/hr + 10 ml/hr  Full liquid diet (start 1/6) Resource Breeze TID (start 1/7)  IVF: D5LR @ 10 ml/hr  Central access: R brachial PICC (12/31) TPN start date: 09/23/14  ASSESSMENT                                                                                                          HPI: 43 yoF admitted on 12/29 with pSBO, s/p open right ureteral reimplant on 09/13/14 with extensive pelvic lysis of adhesions. Urology and CCS managing; suspecting will resolve without operative management. PICC placed 12/31 and pharmacy consulted to start TPN for prolonged ileus.  Significant  events:  1/3: Pt passing flatus and had tiny BM.  Started CLD. 1/4: KUB shows ongoing obstruction 1/5: Tolerating CLD. FC removed after cystogram showed no bladder leak. 1/6: BM x2 after suppository.  Tolerating FLD.   Today, 09/29/2014 :   Glucose - CBGs remain at goal < 150  Electrolytes - remain WNL  Renal - SCr 0.97, stable  LFTs - AST/ALT, Tbili, alk phos WNL  TGs - 99 (1/1), 141 (1/4)  Prealbumin - 17.8 (1/1), 21.5 (1/4)  NUTRITIONAL GOALS  RD recs (1/2): 2100 - 2300 kcal/day, 110-120 g protein/day  Clinimix 5/15 at a goal rate of 100 ml/hr + 20% fat emulsion at 10 ml/hr to provide: 120 g/day protein, 2184 Kcal/day.  PLAN                                                                                                                         At 1800 today:  Continue Clinimix E 5/15 at 100 ml/hr.  20% fat emulsion at 10 ml/hr.  TPN to contain standard multivitamins and trace elements.  Continue IVF at [facility].  Continue SSI and CBGs q6h   TPN lab panels on Mondays & Thursdays.  F/u daily.  If FLD and supplements are tolerated, consider weaning TPN.   Gretta Arab PharmD, BCPS Pager [phone] 09/29/2014 11:38 AM

## 2014-09-29 NOTE — Progress Notes (Signed)
NUTRITION FOLLOW UP  Intervention:   -Recommend Resource Breeze po TID, each supplement provides 250 kcal and 9 grams of protein -Pt to order [employer] Instant Breakfast PRN (with 8-oz 2% milk provides 250 kcal, 12 gram protein) -Encouraged high protein full liquid diet foods -TPN per pharmacy; dicussed possible wean to begin on 1/08 -Diet advancement per MD -RD to continue to monitor  Nutrition Dx:   Inadequate oral intake related to inability to eat as evidenced by NPO status; now as evidenced by PO intake <75%  Goal:   Pt to meet >/= 90% of their estimated nutrition needs    Monitor:   Total protein/energy intake, labs, weights, TPN tolerance, diet order  Assessment:   1/02: Pt started on TPN d/t prolonged ileus.  Per pharmacy, pt is receiving Clinimix 5/20 at 40 ml/hr and 20% fat emulsion at 10 ml/hr, providing 1325 kcal and 48g of protein.  Pt reports N/V symptoms starting last Monday (12/28), states that she has not eaten since then.   Pt has had 9 lb weight loss, insignificant for time frame.  1/07: -Clinimix 5/15 infusing at 100 ml/hr + 20% lipids at 10 ml/hr to provide 2184 kcal, 120 gram protein (100% estimated kcal and protein needs) -Diet advanced to Full Liquid on 1/06 -Pt reported tolerating diet;  Consuming ~75% of small meals grits, sherbet and juices. Is being conservative with intake  -Pt willing to consume Resource Breeze TID. Is warying of trying Boost or Ensure, RD did provide pt samples for alternative supplement as warranated -Encouraged intake of milk w/Carnation Instant Breakfast, and yogurts to assist in meeting protein needs -Discussed TPN wean with pharmacy. Will likely begin wean tomorrow (1/08) after pt has been consuming additional supplementation for >24 hours. -MD noted pt with BMs s/p suppository, and is passing flatus.  -Encouraged fluids and physical activity to assist with BM.  -Pt may d/c on full liquids, will assess diet education needs to  ensure pt meets nutrient needs upon d/c -TPN labs WNL, CBG at goal < 150  Height: Ht Readings from Last 1 Encounters:  09/20/14 5\' 10"  (1.778 m)    Weight Status:   Wt Readings from Last 1 Encounters:  09/20/14 202 lb (91.627 kg)    Re-estimated needs:  Kcal: 2100-2300 Protein: 110-120g Fluid: 2.1L/day  Skin: surgical incision  Diet Order: TPN (CLINIMIX-E) Adult Diet full liquid TPN (CLINIMIX-E) Adult   Intake/Output Summary (Last 24 hours) at 09/29/14 1241 Last data filed at 09/29/14 [id number]  Gross per 24 hour  Intake    120 ml  Output   2250 ml  Net  -2130 ml    Last BM: 1/06   Labs:   Recent Labs Lab 09/26/14 0415 09/27/14 0555 09/28/14 0540 09/29/14 0432  NA 139 139 140 141  K 3.8 4.2 4.0 4.2  CL 105 107 106 107  CO2 25 27 27 28   BUN 16 23 26* 27*  CREATININE 1.00 0.96 0.94 0.97  CALCIUM 8.7 9.1 9.2 9.2  MG 1.8  --  1.8 1.8  PHOS 4.6  --  4.6 4.6  GLUCOSE 87 92 92 106*    CBG (last 3)   Recent Labs  09/29/14 09/29/14 0621 09/29/14 1145  GLUCAP 95 105* 84    Scheduled Meds: . ciprofloxacin  500 mg Oral BID  . feeding supplement (RESOURCE BREEZE)  1 Container Oral TID WC  . heparin subcutaneous  5,000 Units Subcutaneous 3 times per day  . insulin aspart  0-9 Units  Subcutaneous Q6H  . pantoprazole (PROTONIX) IV  40 mg Intravenous Daily  . triamterene-hydrochlorothiazide  1 tablet Oral Daily    Continuous Infusions: . dextrose 5% lactated ringers 10 mL/hr at 09/26/14 1247  . Marland KitchenTPN (CLINIMIX-E) Adult 100 mL/hr at 09/28/14 1737   And  . fat emulsion 240 mL (09/28/14 1738)  . Marland KitchenTPN (CLINIMIX-E) Adult     And  . fat emulsion      Lloyd Huger MS RD LDN Clinical Dietitian [phone]

## 2014-09-29 NOTE — Progress Notes (Signed)
Admitted on 12/29 with pSBO, s/p open right ureteral reimplant on 09/13/14 with extensive pelvic lysis of adhesions.  Intv: Large BM yesterday with suppository Advanced to full liquids for dinner - tolerated grits and pudding Abdomen softer, less nausea Persistent hematuria  PE Filed Vitals:   09/28/14 0613 09/28/14 1500 09/28/14 2149 09/29/14 1308  BP: 109/70 99/47 97/53  97/60  Pulse: 59 68 65 75  Temp: 98.1 F (36.7 C) 98.1 F (36.7 C) 98 F (36.7 C) 98 F (36.7 C)  TempSrc: Oral Oral Oral Oral  Resp: 18 18 18 18   Height:      Weight:      SpO2: 100% 100% 90% 100%    Intake/Output Summary (Last 24 hours) at 09/29/14 1655 Last data filed at 09/29/14 1520  Gross per 24 hour  Intake    240 ml  Output   5350 ml  Net  -5110 ml   NAD Abdomen is soft Minimal abdominal tenderness Incision c/d/i Foley with grossly bloody urine Extremities symmetric   Recent Labs  09/27/14 0555  WBC 9.0  HGB 9.9*  HCT 30.8*    Recent Labs  09/27/14 0555 09/28/14 0540 09/29/14 0432  NA 139 140 141  K 4.2 4.0 4.2  CL 107 106 107  CO2 27 27 28   GLUCOSE 92 92 106*  BUN 23 26* 27*  CREATININE 0.96 0.94 0.97  CALCIUM 9.1 9.2 9.2   No results for input(s): LABPT, INR in the last 72 hours. No results for input(s): LABURIN in the last 72 hours. Results for orders placed or performed during the hospital encounter of 09/20/14  Urine culture     Status: None   Collection Time: 09/20/14  4:21 PM  Result Value Ref Range Status   Specimen Description URINE, CLEAN CATCH  Final   Special Requests NONE  Final   Colony Count   Final    >=100,000 COLONIES/ML Performed at [facility]    Culture   Final    PSEUDOMONAS AERUGINOSA Performed at [facility]    Report Status 09/25/2014 FINAL  Final   Organism ID, Bacteria PSEUDOMONAS AERUGINOSA  Final      Susceptibility   Pseudomonas aeruginosa - MIC*    CEFEPIME 8 SENSITIVE Sensitive     CEFTAZIDIME 8 SENSITIVE  Sensitive     CIPROFLOXACIN 1 SENSITIVE Sensitive     GENTAMICIN <=1 SENSITIVE Sensitive     IMIPENEM 2 SENSITIVE Sensitive     PIP/TAZO 32 SENSITIVE Sensitive     TOBRAMYCIN <=1 SENSITIVE Sensitive     * PSEUDOMONAS AERUGINOSA     KUB - no xray today  Imp- resolving post-op pSBO secondary to adhesions.   Plan -  Full liquids with potential advance this PM to regular diet, taper TPN, potentially stop tomorrow PM. IV medications Repeat urine culture to ensure bacturia has cleared Repeat dulculax supp today. Appreciate GS input.

## 2014-09-29 NOTE — Progress Notes (Signed)
  Subjective: No real change, she had 2 Bm's yesterday, one after suppository and one about 2 hours later.  None today so far.   Some flatus not much.    Objective: Vital signs in last 24 hours: Temp:  [98 F (36.7 C)-98.1 F (36.7 C)] 98 F (36.7 C) (01/06 2149) Pulse Rate:  [65-68] 65 (01/06 2149) Resp:  [18] 18 (01/06 2149) BP: (97-99)/(47-53) 97/53 mmHg (01/06 2149) SpO2:  [90 %-100 %] 90 % (01/06 2149) Last BM Date: 09/28/14 360 PO No BM reported,  Full liquid diet BP a little soft Labs OK Intake/Output from previous day: 01/06 0701 - 01/07 0700 In: 360 [P.O.:360] Out: 2650 [Urine:2650] Intake/Output this shift:    General appearance: alert, cooperative and no distress Resp: clear to auscultation bilaterally GI: soft very few BS, some flatus, but not much.    Lab Results:   Recent Labs  09/27/14 0555  WBC 9.0  HGB 9.9*  HCT 30.8*  PLT 404*    BMET  Recent Labs  09/28/14 0540 09/29/14 0432  NA 140 141  K 4.0 4.2  CL 106 107  CO2 27 28  GLUCOSE 92 106*  BUN 26* 27*  CREATININE 0.94 0.97  CALCIUM 9.2 9.2   PT/INR No results for input(s): LABPROT, INR in the last 72 hours.   Recent Labs Lab 09/23/14 0615 09/24/14 0500 09/25/14 0430 09/26/14 0415 09/29/14 0432  AST 18 20 25  32 25  ALT 14 14 16 23 26   ALKPHOS 50 53 55 58 63  BILITOT 0.3 0.3 0.3 0.4 0.3  PROT 5.9* 6.1 6.3 6.3 6.6  ALBUMIN 2.8* 3.0* 2.9* 2.9* 3.2*     Lipase     Component Value Date/Time   LIPASE 21 09/20/2014 1425     Studies/Results: No results found.  Medications: . ciprofloxacin  500 mg Oral BID  . heparin subcutaneous  5,000 Units Subcutaneous 3 times per day  . insulin aspart  0-9 Units Subcutaneous Q6H  . pantoprazole (PROTONIX) IV  40 mg Intravenous Daily  . triamterene-hydrochlorothiazide  1 tablet Oral Daily    Assessment/Plan SBO Right distal ureteral stricture, S/p OPEN RIGHT URETERAL REIMPLANT; Surgeon: Crist FatBenjamin W Herrick, MD,  09/13/14. Hypertension GERD DEpression Osteoarthritis Hyperlipidemia   Plan:  She is walking the halls off, tolerating full liquids, some flatus, not much.  She is going to get another suppository.  Discussed with Dr. Daphine DeutscherMartin.  She could go go home on full liquids.  i will leave her on fulls, and see how she does.  CT scan done on 09/20/14.      LOS: 9 days    JENNINGS,WILLARD 09/29/2014

## 2014-09-30 LAB — URINE CULTURE
Colony Count: NO GROWTH
Culture: NO GROWTH

## 2014-09-30 LAB — GLUCOSE, CAPILLARY
Glucose-Capillary: 102 mg/dL — ABNORMAL HIGH (ref 70–99)
Glucose-Capillary: 91 mg/dL (ref 70–99)
Glucose-Capillary: 93 mg/dL (ref 70–99)
Glucose-Capillary: 96 mg/dL (ref 70–99)

## 2014-09-30 MED ORDER — ONDANSETRON HCL 4 MG PO TABS: 4.0000 mg | ORAL_TABLET | Freq: Three times a day (TID) | ORAL | Status: DC | PRN

## 2014-09-30 MED ORDER — FAT EMULSION 20 % IV EMUL
240.0000 mL | INTRAVENOUS | Status: AC
  Administered 2014-09-30: 240 mL via INTRAVENOUS
  Filled 2014-09-30: qty 250

## 2014-09-30 MED ORDER — BISACODYL 10 MG RE SUPP: 10.0000 mg | Freq: Every day | RECTAL | Status: DC | PRN

## 2014-09-30 MED ORDER — TRACE MINERALS CR-CU-F-FE-I-MN-MO-SE-ZN IV SOLN
INTRAVENOUS | Status: AC
  Administered 2014-09-30: 18:00:00 via INTRAVENOUS
  Filled 2014-09-30 (×2): qty 1100

## 2014-09-30 NOTE — Progress Notes (Signed)
  Subjective: Stable and coming along slowly.  She plans to go home tomorrow with liquids.    Objective: Vital signs in last 24 hours: Temp:  [97.9 F (36.6 C)-98.2 F (36.8 C)] 98.2 F (36.8 C) (01/08 0504) Pulse Rate:  [66-75] 66 (01/08 0504) Resp:  [18] 18 (01/08 0504) BP: (97-124)/(59-71) 124/71 mmHg (01/08 0504) SpO2:  [100 %] 100 % (01/08 0504) Last BM Date: 09/29/14 240 PO recorded No BM recorded Afebrile, VSS No labs Intake/Output from previous day: 01/07 0701 - 01/08 0700 In: 240 [P.O.:240] Out: 5100 [Urine:5100] Intake/Output this shift: Total I/O In: -  Out: 800 [Urine:800]  General appearance: alert, cooperative and no distress GI: soft, non-tender; bowel sounds normal; no masses,  no organomegaly  Lab Results:  No results for input(s): WBC, HGB, HCT, PLT in the last 72 hours.  BMET  Recent Labs  09/28/14 0540 09/29/14 0432  NA 140 141  K 4.0 4.2  CL 106 107  CO2 27 28  GLUCOSE 92 106*  BUN 26* 27*  CREATININE 0.94 0.97  CALCIUM 9.2 9.2   PT/INR No results for input(s): LABPROT, INR in the last 72 hours.   Recent Labs Lab 09/24/14 0500 09/25/14 0430 09/26/14 0415 09/29/14 0432  AST 20 25 32 25  ALT 14 16 23 26   ALKPHOS 53 55 58 63  BILITOT 0.3 0.3 0.4 0.3  PROT 6.1 6.3 6.3 6.6  ALBUMIN 3.0* 2.9* 2.9* 3.2*     Lipase     Component Value Date/Time   LIPASE 21 09/20/2014 1425     Studies/Results: No results found.  Medications: . feeding supplement (RESOURCE BREEZE)  1 Container Oral TID WC  . heparin subcutaneous  5,000 Units Subcutaneous 3 times per day  . insulin aspart  0-9 Units Subcutaneous Q6H  . pantoprazole (PROTONIX) IV  40 mg Intravenous Daily  . triamterene-hydrochlorothiazide  1 tablet Oral Daily    Assessment/Plan SBO Right distal ureteral stricture, S/p OPEN RIGHT URETERAL REIMPLANT; Surgeon: Crist FatBenjamin W Herrick, MD, 09/13/14. Hypertension GERD Depression Osteoarthritis Hyperlipidemia   Plan:   Tentatively planning to go home tomorrow.  We will be available as needed.    LOS: 10 days    Jennifer Sanford 09/30/2014

## 2014-09-30 NOTE — Progress Notes (Signed)
Admitted on 12/29 with pSBO, s/p open right ureteral reimplant on 09/13/14 with extensive pelvic lysis of adhesions.  Intv: Nausea largely resolved Pain minimal No BM to speak of yesterday Tolerating fulls - will keep her there and d/c her on full liquids per GS. Hematuria improving  PE Filed Vitals:   09/28/14 2149 09/29/14 1308 09/29/14 2212 09/30/14 0504  BP: 97/53 97/60 107/59 124/71  Pulse: 65 75 75 66  Temp: 98 F (36.7 C) 98 F (36.7 C) 97.9 F (36.6 C) 98.2 F (36.8 C)  TempSrc: Oral Oral Oral Oral  Resp: 18 18 18 18   Height:      Weight:      SpO2: 90% 100% 100% 100%    Intake/Output Summary (Last 24 hours) at 09/30/14 0910 Last data filed at 09/30/14 0846  Gross per 24 hour  Intake    240 ml  Output   5900 ml  Net  -5660 ml   NAD Abdomen is soft Minimal abdominal tenderness Incision c/d/i Extremities symmetric  No results for input(s): WBC, HGB, HCT in the last 72 hours.  Recent Labs  09/28/14 0540 09/29/14 0432  NA 140 141  K 4.0 4.2  CL 106 107  CO2 27 28  GLUCOSE 92 106*  BUN 26* 27*  CREATININE 0.94 0.97  CALCIUM 9.2 9.2   No results for input(s): LABPT, INR in the last 72 hours. No results for input(s): LABURIN in the last 72 hours. Results for orders placed or performed during the hospital encounter of 09/20/14  Urine culture     Status: None   Collection Time: 09/20/14  4:21 PM  Result Value Ref Range Status   Specimen Description URINE, CLEAN CATCH  Final   Special Requests NONE  Final   Colony Count   Final    >=100,000 COLONIES/ML Performed at [facility]    Culture   Final    PSEUDOMONAS AERUGINOSA Performed at [facility]    Report Status 09/25/2014 FINAL  Final   Organism ID, Bacteria PSEUDOMONAS AERUGINOSA  Final      Susceptibility   Pseudomonas aeruginosa - MIC*    CEFEPIME 8 SENSITIVE Sensitive     CEFTAZIDIME 8 SENSITIVE Sensitive     CIPROFLOXACIN 1 SENSITIVE Sensitive     GENTAMICIN <=1  SENSITIVE Sensitive     IMIPENEM 2 SENSITIVE Sensitive     PIP/TAZO 32 SENSITIVE Sensitive     TOBRAMYCIN <=1 SENSITIVE Sensitive     * PSEUDOMONAS AERUGINOSA     KUB - no xray today  Imp- resolving post-op pSBO secondary to adhesions.   Plan -  Full liquids - up to patient now to maintain caloric/protein needs, appreciate nutrition helping with supplemental protein drinks Taper TPN Cipro ends today Appreciate GS input Hoping to d/c tomorrow but need to make sure she is able to maintain her nutrition orally.

## 2014-09-30 NOTE — Discharge Summary (Signed)
Date of admission: 09/20/2014  Date of discharge: 10/01/14  Admission diagnosis: early post-operative partial small bowel obstruction  Discharge diagnosis: same  Secondary diagnoses:  Patient Active Problem List   Diagnosis Date Noted  . 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 S/P ureteral reimplantation 09/13/2014  . Postoperative stiffness of total knee replacement 06/01/2013  . S/P bilateral unicompartmental knee replacement 03/22/2013  . Postop Transfusion 03/21/2013  . Postoperative anemia due to acute blood loss 03/18/2013  . Hyponatremia 03/18/2013  . OA (osteoarthritis) of knee 03/17/2013  . TENOSYNOVITIS OF FOOT AND ANKLE 08/28/2009  . FOOT PAIN, RIGHT 08/28/2009  . PES PLANUS 08/28/2009  . ABNORMALITY OF GAIT 08/28/2009    History and Physical: For full details, please see admission history and physical. Briefly, Jennifer Sanford is a 64 y.o. year old patient who Underwent an open right ureteral reimplant on 09/13/14.  She subsequently presented to the hospital after being discharged on 09/20/14 complaining of intense intermittent/colicky abdominal pain with associated nausea and vomiting.  A CT scan was performed showing a partial small bowel obstruction.  Patient was admitted for ongoing management.   Hospital Course:  The patient was admitted to the floor from the emergency department and placed on bowel rest.  On hospital day #3 a PICC line was placed and she was started on TPN.  She continued to be followed by general surgery who recommended conservative management.  Over time her abdomen slowly improved, her pain subsided as did her nausea.  On hospital day #12 the patient was tolerating a full liquid/soft diet without significant nausea or pain.  She was taking protein supplements so that she can maintain her caloric/protein intake.  While in the hospital the patient underwent a cystogram confirmed that her bladder had healed from her reimplant and as such the Foley catheter  was removed.  In addition, her staples were removed and her incision was Steri-Stripped.  She was placed on a 5 day course of ciprofloxacin for a urine culture that was obtained in the emergency department which grew pseudomonas aeruginosa.  She did have some hematuria which ultimately resolved after being treated with antibiotics.   Laboratory values:  No results for input(s): WBC, HGB, HCT in the last 72 hours. No results for input(s): NA, K, CL, CO2, GLUCOSE, BUN, CREATININE, CALCIUM in the last 72 hours. No results for input(s): LABPT, INR in the last 72 hours. No results for input(s): LABURIN in the last 72 hours. Results for orders placed or performed during the hospital encounter of 09/20/14  Urine culture     Status: None   Collection Time: 09/20/14  4:21 PM  Result Value Ref Range Status   Specimen Description URINE, CLEAN CATCH  Final   Special Requests NONE  Final   Colony Count   Final    >=100,000 COLONIES/ML Performed at [facility]    Culture   Final    PSEUDOMONAS AERUGINOSA Performed at [facility]    Report Status 09/25/2014 FINAL  Final   Organism ID, Bacteria PSEUDOMONAS AERUGINOSA  Final      Susceptibility   Pseudomonas aeruginosa - MIC*    CEFEPIME 8 SENSITIVE Sensitive     CEFTAZIDIME 8 SENSITIVE Sensitive     CIPROFLOXACIN 1 SENSITIVE Sensitive     GENTAMICIN <=1 SENSITIVE Sensitive     IMIPENEM 2 SENSITIVE Sensitive     PIP/TAZO 32 SENSITIVE Sensitive     TOBRAMYCIN <=1 SENSITIVE Sensitive     * PSEUDOMONAS AERUGINOSA  Urine culture     Status: None   Collection Time: 09/29/14  5:48 PM  Result Value Ref Range Status   Specimen Description URINE, CLEAN CATCH  Final   Special Requests NONE  Final   Colony Count NO GROWTH Performed at [facility]   Final   Culture NO GROWTH Performed at [facility]   Final   Report Status 09/30/2014 FINAL  Final    Disposition: Home  Discharge instruction: The patient was  instructed to be ambulatory but told to refrain from heavy lifting, strenuous activity, or driving.   Discharge medications:    Medication List    TAKE these medications        acetaminophen 500 MG tablet  Commonly known as:  TYLENOL  Take 1,000 mg by mouth every 6 (six) hours as needed for moderate pain (pain).     aspirin EC 81 MG tablet  Take 81 mg by mouth every morning.     bisacodyl 10 MG suppository  Commonly known as:  DULCOLAX  Place 1 suppository (10 mg total) rectally daily as needed for moderate constipation.     citalopram 20 MG tablet  Commonly known as:  CELEXA  Take 20 mg by mouth every morning.     DSS 100 MG Caps  Take 100 mg by mouth 2 (two) times daily.     fenofibrate 160 MG tablet  Take 160 mg by mouth every morning.     ondansetron 4 MG tablet  Commonly known as:  ZOFRAN  Take 1 tablet (4 mg total) by mouth every 8 (eight) hours as needed for nausea or vomiting.     oxyCODONE 5 MG immediate release tablet  Commonly known as:  Oxy IR/ROXICODONE  Take 1-2 tablets (5-10 mg total) by mouth every 4 (four) hours as needed for severe pain.     oxyCODONE-acetaminophen 5-325 MG per tablet  Commonly known as:  ROXICET  Take 1 tablet by mouth every 4 (four) hours as needed for severe pain.     pantoprazole 40 MG tablet  Commonly known as:  PROTONIX  Take 40 mg by mouth every morning.     phentermine 37.5 MG tablet  Commonly known as:  ADIPEX-P  Take 18.75 mg by mouth daily before breakfast.     pyridOXINE 100 MG tablet  Commonly known as:  VITAMIN B-6  Take 100 mg by mouth every morning.     triamterene-hydrochlorothiazide 37.5-25 MG per tablet  Commonly known as:  MAXZIDE-25  Take 0.5 tablets by mouth every morning.     trimethoprim 100 MG tablet  Commonly known as:  TRIMPEX  Take 1 tablet (100 mg total) by mouth 1 day or 1 dose.     vitamin B-12 1000 MCG tablet  Commonly known as:  CYANOCOBALAMIN  Take 1,000 mcg by mouth every morning.         Followup:  Follow-up Information    Follow up with Crist Fat, MD On 10/27/2014.   Specialty:  Urology   Why:  10:45am for cystoscopy and stent removal   Contact information:   [address] [place] [address] [phone]

## 2014-09-30 NOTE — Progress Notes (Signed)
PARENTERAL NUTRITION CONSULT NOTE - FOLLOW UP  Pharmacy Consult for TPN Indication: Prolonged ileus  No Known Allergies  Patient Measurements: Height: '5\' 10"'  (177.8 cm) Weight: 202 lb (91.627 kg) IBW/kg (Calculated) : 68.5 Adjusted Body Weight: 75kg  Intake/Output from previous day: 01/07 0701 - 01/08 0700 In: 240 [P.O.:240] Out: 5100 [Urine:5100] Intake/Output from this shift: Total I/O In: -  Out: 800 [Urine:800]  Labs: No results for input(s): WBC, HGB, HCT, PLT, APTT, INR in the last 72 hours.  Recent Labs  09/28/14 0540 09/29/14 0432  NA 140 141  K 4.0 4.2  CL 106 107  CO2 27 28  GLUCOSE 92 106*  BUN 26* 27*  CREATININE 0.94 0.97  CALCIUM 9.2 9.2  MG 1.8 1.8  PHOS 4.6 4.6  PROT  --  6.6  ALBUMIN  --  3.2*  AST  --  25  ALT  --  26  ALKPHOS  --  63  BILITOT  --  0.3   Estimated Creatinine Clearance: 74.7 mL/min (by C-G formula based on Cr of 0.97).    Recent Labs  09/29/14 1704 09/30/14 0035 09/30/14 0653  GLUCAP 88 102* 91    Medications:  Infusions:  . dextrose 5% lactated ringers 10 mL/hr at 09/26/14 1247  . Marland KitchenTPN (CLINIMIX-E) Adult 100 mL/hr at 09/29/14 1746   And  . fat emulsion 240 mL (09/29/14 1744)    Insulin Requirements:  Sensitive SSI q6h - none used   Current Nutrition:  Clinimix and Lipids at goal rate 100 ml/hr + 10 ml/hr  Full liquid diet (start 1/6) Resource Breeze TID (start 1/7)  IVF: D5LR @ 10 ml/hr  Central access: R brachial PICC (12/31) TPN start date: 09/23/14  ASSESSMENT                                                                                                          HPI: 89 yoF admitted on 12/29 with pSBO, s/p open right ureteral reimplant on 09/13/14 with extensive pelvic lysis of adhesions. Urology and CCS managing; suspecting will resolve without operative management. PICC placed 12/31 and pharmacy consulted to start TPN for prolonged ileus.  Significant events:  1/3: Pt passing flatus and had tiny  BM.  Started CLD. 1/4: KUB shows ongoing obstruction 1/5: Tolerating CLD. FC removed after cystogram showed no bladder leak. 1/6: BM x2 after suppository.  Tolerating FLD. 1/8: tolerating supplements, as well as protein-containing foods.  Sgx planning to discharge patient tomorrow; would like to wean TPN off overnight but continue at full rate as long as possible to provide maximal caloric intake before discharge.   Today, 09/30/2014 :   Glucose - CBGs remain at goal < 150  Electrolytes - no BMP today though previously stable and wnl  Renal - SCr 0.97, stable  LFTs - AST/ALT, Tbili, alk phos WNL  TGs - 99 (1/1), 141 (1/4)  Prealbumin - 17.8 (1/1), 21.5 (1/4)  NUTRITIONAL GOALS  RD recs (1/2): 2100 - 2300 kcal/day, 110-120 g protein/day  Clinimix 5/15 at a goal rate of 100 ml/hr + 20% fat emulsion at 10 ml/hr to provide: 120 g/day protein, 2184 Kcal/day.  PLAN                                                                                                                         At 1800 today:  Continue Clinimix E 5/15 at 100 ml/hr.  Will decrease rate to 50 ml/hr at 0400 hours tomorrow, then off at 0600 hours  Continue 20% fat emulsion at 10 ml/hr, to stop at 0600 hours tomorrow AM  TPN to contain standard multivitamins and trace elements.  Continue IVF at [facility] and CBGs q6h   TPN lab panels on Mondays & Thursdays until TPN off  F/u daily   Reuel Boom, PharmD Pager: [phone] 09/30/2014, 11:30 AM

## 2014-09-30 NOTE — Discharge Instructions (Signed)
·   Activity:  You are encouraged to ambulate frequently (about every hour during waking hours) to help prevent blood clots from forming in your legs or lungs.  However, you should not engage in any heavy lifting (> 10-15 lbs), strenuous activity, or straining.   Diet: You should maintain a soft diet with protein shake supplements until you feel you can tolerate a more substantial diet.  In the beginning it is often more effective to eat more frequent small meals rather than three bigger meals daily.   Prescriptions:  You have been given Zofran for nausea and dulcolax suppositories as needed for constipation.  Take Tylenol for pain as needed.   Incisions: No special care needed.  The steri-strips will fall off over the next 5-10 days.   What to call us about: You should call the office [phone]) if you develop fever > 101 or develop persistent vomiting. Activity:  You are encouraged to ambulate frequently (about every hour during waking hours) to help prevent blood clots from forming in your legs or lungs.  However, you should not engage in any heavy lifting (> 10-15 lbs), strenuous activity, or straining.

## 2014-09-30 NOTE — Progress Notes (Signed)
NUTRITION FOLLOW UP  Intervention:   -Recommend Resource Breeze po TID, each supplement provides 250 kcal and 9 grams of protein -Reviewed nutrition education handouts regarding [place] diet protein sources and smoothie recipe booklet -Reviewed nutrition needs for discharge -Provided pt nutrition supplement coupons -TPN wean per pharmacy, plan to d/c TPN by 6AM 1/09 -RD to continue to monitor  Nutrition Dx:   Inadequate oral intake related to inability to eat as evidenced by NPO status; now as evidenced by PO intake <75%  Goal:   Pt to meet >/= 90% of their estimated nutrition needs    Monitor:   Total protein/energy intake, labs, weights, TPN tolerance, diet order  Assessment:   1/02: Pt started on TPN d/t prolonged ileus.  Per pharmacy, pt is receiving Clinimix 5/20 at 40 ml/hr and 20% fat emulsion at 10 ml/hr, providing 1325 kcal and 48g of protein.  Pt reports N/V symptoms starting last Monday (12/28), states that she has not eaten since then.   Pt has had 9 lb weight loss, insignificant for time frame.  1/07: -Clinimix 5/15 infusing at 100 ml/hr + 20% lipids at 10 ml/hr to provide 2184 kcal, 120 gram protein (100% estimated kcal and protein needs) -Diet advanced to Full Liquid on 1/06 -Pt reported tolerating diet;  Consuming ~75% of small meals grits, sherbet and juices. Is being conservative with intake  -Pt willing to consume Resource Breeze TID. Is warying of trying Boost or Ensure, RD did provide pt samples for alternative supplement as warranated -Encouraged intake of milk w/Carnation Instant Breakfast, and yogurts to assist in meeting protein needs -Discussed TPN wean with pharmacy. Will likely begin wean tomorrow (1/08) after pt has been consuming additional supplementation for >24 hours. -MD noted pt with BMs s/p suppository, and is passing flatus.  -Encouraged fluids and physical activity to assist with BM.  -Pt may d/c on full liquids, will assess diet education needs  to ensure pt meets nutrient needs upon d/c -TPN labs WNL, CBG at goal < 150  1/08: -Pt tolerating Full liquid diet; consuming 100% of meals consisting of grits, juice, cream of chicken soup, and pudding -Drinking Resource Breeze TID, has case of supplement ordered from Outpatient pharmacy to consume upon d/c -Provided nutrition education handouts and supplement coupons to assist in meeting nutrition needs -Reviewed protein and kcal needs for pt; plan for pt to continue on Full liquid diet and advance to solids as tolerated once pt starts having regular BM  Height: Ht Readings from Last 1 Encounters:  09/20/14 5\' 10"  (1.778 m)    Weight Status:   Wt Readings from Last 1 Encounters:  09/20/14 202 lb (91.627 kg)    Estimated needs for discharge:  Kcal: 2000-2200 Protein: ~90g Fluid: 2.1L/day  Skin: surgical incision  Diet Order: Diet full liquid TPN (CLINIMIX-E) Adult .TPN (CLINIMIX-E) Adult   Intake/Output Summary (Last 24 hours) at 09/30/14 1254 Last data filed at 09/30/14 0846  Gross per 24 hour  Intake    120 ml  Output   3000 ml  Net  -2880 ml    Last BM: 1/07   Labs:   Recent Labs Lab 09/26/14 0415 09/27/14 0555 09/28/14 0540 09/29/14 0432  NA 139 139 140 141  K 3.8 4.2 4.0 4.2  CL 105 107 106 107  CO2 25 27 27 28   BUN 16 23 26* 27*  CREATININE 1.00 0.96 0.94 0.97  CALCIUM 8.7 9.1 9.2 9.2  MG 1.8  --  1.8 1.8  PHOS  4.6  --  4.6 4.6  GLUCOSE 87 92 92 106*    CBG (last 3)   Recent Labs  09/30/14 0035 09/30/14 0653 09/30/14 1118  GLUCAP 102* 91 96    Scheduled Meds: . feeding supplement (RESOURCE BREEZE)  1 Container Oral TID WC  . heparin subcutaneous  5,000 Units Subcutaneous 3 times per day  . insulin aspart  0-9 Units Subcutaneous Q6H  . pantoprazole (PROTONIX) IV  40 mg Intravenous Daily  . triamterene-hydrochlorothiazide  1 tablet Oral Daily    Continuous Infusions: . Marland KitchenTPN (CLINIMIX-E) Adult     And  . fat emulsion    .  dextrose 5% lactated ringers 10 mL/hr at 09/30/14 1225  . Marland KitchenTPN (CLINIMIX-E) Adult 100 mL/hr at 09/29/14 1746   And  . fat emulsion 240 mL (09/29/14 1744)    Lloyd Huger MS RD LDN Clinical Dietitian Pager:[phone]

## 2014-10-01 LAB — GLUCOSE, CAPILLARY: Glucose-Capillary: 91 mg/dL (ref 70–99)

## 2014-10-01 NOTE — Progress Notes (Signed)
  Subjective: No complaints. Feels better  Objective: Vital signs in last 24 hours: Temp:  [97.9 F (36.6 C)-98 F (36.7 C)] 98 F (36.7 C) (01/09 16100633) Pulse Rate:  [72-94] 72 (01/09 0633) Resp:  [18] 18 (01/09 96040633) BP: (97-119)/(47-66) 97/66 mmHg (01/09 0633) SpO2:  [96 %-100 %] 96 % (01/09 54090633) Last BM Date: 09/30/14  Intake/Output from previous day: 01/08 0701 - 01/09 0700 In: 2552 [I.V.:230; TPN:2322] Out: 1350 [Urine:1350] Intake/Output this shift:    Resp: clear to auscultation bilaterally Cardio: regular rate and rhythm GI: soft, nontender  Lab Results:  No results for input(s): WBC, HGB, HCT, PLT in the last 72 hours. BMET  Recent Labs  09/29/14 0432  NA 141  K 4.2  CL 107  CO2 28  GLUCOSE 106*  BUN 27*  CREATININE 0.97  CALCIUM 9.2   PT/INR No results for input(s): LABPROT, INR in the last 72 hours. ABG No results for input(s): PHART, HCO3 in the last 72 hours.  Invalid input(s): PCO2, PO2  Studies/Results: No results found.  Anti-infectives: Anti-infectives    Start     Dose/Rate Route Frequency Ordered Stop   09/25/14 2000  ciprofloxacin (CIPRO) tablet 500 mg     500 mg Oral 2 times daily 09/25/14 1812 09/30/14 0809   09/20/14 1645  cefTRIAXone (ROCEPHIN) 1 g in dextrose 5 % 50 mL IVPB     1 g100 mL/hr over 30 Minutes Intravenous  Once 09/20/14 1638 09/20/14 1745      Assessment/Plan: s/p * No surgery found * she is planning to go home today on full liquid diet and advance as she feels like she can tolerate  Call if we can help  LOS: 11 days    TOTH III,Kammi Hechler S 10/01/2014

## 2014-10-01 NOTE — Progress Notes (Signed)
  Subjective: Patient reports wanting to go home this morning. She is continued to have flatus and small BMs. She is tolerating full liquids well. She has no pain or discomfort. She is voiding well with pink tinged urine. She has no new complaints or concerns or questions discharge today.  Objective: Vital signs in last 24 hours: Temp:  [97.9 F (36.6 C)-98 F (36.7 C)] 98 F (36.7 C) (01/09 16100633) Pulse Rate:  [72-94] 72 (01/09 0633) Resp:  [18] 18 (01/09 0633) BP: (97-119)/(47-66) 97/66 mmHg (01/09 0633) SpO2:  [96 %-100 %] 96 % (01/09 96040633)  Intake/Output from previous day: 01/08 0701 - 01/09 0700 In: 2552 [I.V.:230; TPN:2322] Out: 1350 [Urine:1350] Intake/Output this shift:    Physical Exam:  Constitutional: Vital signs reviewed. WD WN in NAD   Eyes: PERRL, No scleral icterus.   Cardiovascular: RRR Pulmonary/Chest: Normal effort Abdominal: Soft. Non-tender, non-distended Extremities: No cyanosis or edema   Lab Results: No results for input(s): HGB, HCT in the last 72 hours. BMET  Recent Labs  09/29/14 0432  NA 141  K 4.2  CL 107  CO2 28  GLUCOSE 106*  BUN 27*  CREATININE 0.97  CALCIUM 9.2   No results for input(s): LABPT, INR in the last 72 hours. No results for input(s): LABURIN in the last 72 hours. Results for orders placed or performed during the hospital encounter of 09/20/14  Urine culture     Status: None   Collection Time: 09/20/14  4:21 PM  Result Value Ref Range Status   Specimen Description URINE, CLEAN CATCH  Final   Special Requests NONE  Final   Colony Count   Final    >=100,000 COLONIES/ML Performed at [facility]    Culture   Final    PSEUDOMONAS AERUGINOSA Performed at [facility]    Report Status 09/25/2014 FINAL  Final   Organism ID, Bacteria PSEUDOMONAS AERUGINOSA  Final      Susceptibility   Pseudomonas aeruginosa - MIC*    CEFEPIME 8 SENSITIVE Sensitive     CEFTAZIDIME 8 SENSITIVE Sensitive    CIPROFLOXACIN 1 SENSITIVE Sensitive     GENTAMICIN <=1 SENSITIVE Sensitive     IMIPENEM 2 SENSITIVE Sensitive     PIP/TAZO 32 SENSITIVE Sensitive     TOBRAMYCIN <=1 SENSITIVE Sensitive     * PSEUDOMONAS AERUGINOSA  Urine culture     Status: None   Collection Time: 09/29/14  5:48 PM  Result Value Ref Range Status   Specimen Description URINE, CLEAN CATCH  Final   Special Requests NONE  Final   Colony Count NO GROWTH Performed at [facility]   Final   Culture NO GROWTH Performed at [facility]   Final   Report Status 09/30/2014 FINAL  Final    Studies/Results: No results found.   Assessment/Plan: Partial small bowel obstruction now resolved. Patient would like to go home today. I see no contraindication. PICC line will need to be removed. This was discussed with nursing services.   LOS: 11 days   Jennifer Sanford S 10/01/2014, 8:21 AM

## 2014-11-23 ENCOUNTER — Encounter (HOSPITAL_BASED_OUTPATIENT_CLINIC_OR_DEPARTMENT_OTHER): Payer: Self-pay | Admitting: *Deleted

## 2014-11-23 ENCOUNTER — Other Ambulatory Visit: Payer: Self-pay | Admitting: Urology

## 2014-11-24 ENCOUNTER — Encounter (HOSPITAL_BASED_OUTPATIENT_CLINIC_OR_DEPARTMENT_OTHER): Payer: Self-pay | Admitting: *Deleted

## 2014-11-24 NOTE — Progress Notes (Signed)
Pt instructed npo pmn 3/6 x celexa, protonix, and fenofibrate w sip of water.  [facility].  Needs hgb on arrival

## 2014-11-27 ENCOUNTER — Encounter (HOSPITAL_BASED_OUTPATIENT_CLINIC_OR_DEPARTMENT_OTHER): Payer: Self-pay | Admitting: Anesthesiology

## 2014-11-27 NOTE — Anesthesia Preprocedure Evaluation (Addendum)
Anesthesia Evaluation  Patient identified by MRN, date of birth, ID band Patient awake    Reviewed: Allergy & Precautions, NPO status , Patient's Chart, lab work & pertinent test results  History of Anesthesia Complications (+) history of anesthetic complications  Airway Mallampati: II  TM Distance: >3 FB Neck ROM: Full    Dental no notable dental hx. (+) Dental Advisory Given, Teeth Intact,    Pulmonary neg pulmonary ROS,  breath sounds clear to auscultation  Pulmonary exam normal       Cardiovascular Exercise Tolerance: Good hypertension, Rhythm:Regular Rate:Normal  Off blood pressure medicines since late December because her blood pressure has been controlled.   Neuro/Psych PSYCHIATRIC DISORDERS Depression Claustrophobianegative neurological ROS     GI/Hepatic Neg liver ROS, GERD-  Medicated and Controlled,  Endo/Other  negative endocrine ROS  Renal/GU negative Renal ROS  negative genitourinary   Musculoskeletal  (+) Arthritis -,   Abdominal   Peds negative pediatric ROS (+)  Hematology  (+) anemia ,   Anesthesia Other Findings Permanent bridge  Reproductive/Obstetrics negative OB ROS                        Anesthesia Physical Anesthesia Plan  ASA: II  Anesthesia Plan: General   Post-op Pain Management:    Induction: Intravenous  Airway Management Planned: LMA  Additional Equipment:   Intra-op Plan:   Post-operative Plan: Extubation in OR  Informed Consent: I have reviewed the patients History and Physical, chart, labs and discussed the procedure including the risks, benefits and alternatives for the proposed anesthesia with the patient or authorized representative who has indicated his/her understanding and acceptance.   Dental advisory given  Plan Discussed with: CRNA  Anesthesia Plan Comments:         Anesthesia Quick Evaluation

## 2014-11-28 ENCOUNTER — Encounter (HOSPITAL_BASED_OUTPATIENT_CLINIC_OR_DEPARTMENT_OTHER)
Admission: RE | Disposition: A | Discharge status: Home / Self Care | Payer: Self-pay | Source: Ambulatory Visit | Attending: Urology

## 2014-11-28 ENCOUNTER — Encounter (HOSPITAL_BASED_OUTPATIENT_CLINIC_OR_DEPARTMENT_OTHER): Payer: Self-pay | Admitting: *Deleted

## 2014-11-28 ENCOUNTER — Ambulatory Visit (HOSPITAL_BASED_OUTPATIENT_CLINIC_OR_DEPARTMENT_OTHER): Payer: 59 | Admitting: Anesthesiology

## 2014-11-28 ENCOUNTER — Ambulatory Visit (HOSPITAL_BASED_OUTPATIENT_CLINIC_OR_DEPARTMENT_OTHER)
Admission: RE | Admit: 2014-11-28 | Discharge: 2014-11-28 | Disposition: A | Discharge status: Home / Self Care | Payer: 59 | Source: Ambulatory Visit | Attending: Urology | Admitting: Urology

## 2014-11-28 DIAGNOSIS — N135 Crossing vessel and stricture of ureter without hydronephrosis: Secondary | ICD-10-CM

## 2014-11-28 DIAGNOSIS — M1990 Unspecified osteoarthritis, unspecified site: Secondary | ICD-10-CM | POA: Diagnosis not present

## 2014-11-28 DIAGNOSIS — K219 Gastro-esophageal reflux disease without esophagitis: Secondary | ICD-10-CM | POA: Diagnosis not present

## 2014-11-28 DIAGNOSIS — D649 Anemia, unspecified: Secondary | ICD-10-CM | POA: Insufficient documentation

## 2014-11-28 DIAGNOSIS — F329 Major depressive disorder, single episode, unspecified: Secondary | ICD-10-CM | POA: Insufficient documentation

## 2014-11-28 DIAGNOSIS — I10 Essential (primary) hypertension: Secondary | ICD-10-CM | POA: Insufficient documentation

## 2014-11-28 DIAGNOSIS — Z9889 Other specified postprocedural states: Secondary | ICD-10-CM

## 2014-11-28 DIAGNOSIS — T814XXA Infection following a procedure, initial encounter: Secondary | ICD-10-CM | POA: Insufficient documentation

## 2014-11-28 HISTORY — DX: Other specified postprocedural states: Z98.890

## 2014-11-28 HISTORY — DX: Infection following a procedure, other surgical site, initial encounter: T81.49XA

## 2014-11-28 HISTORY — PX: INCISION AND DRAINAGE ABSCESS: SHX5864

## 2014-11-28 HISTORY — DX: Hyperlipidemia, unspecified: E78.5

## 2014-11-28 HISTORY — DX: Personal history of other medical treatment: Z92.89

## 2014-11-28 LAB — POCT HEMOGLOBIN-HEMACUE: Hemoglobin: 10.9 g/dL — ABNORMAL LOW (ref 12.0–15.0)

## 2014-11-28 SURGERY — INCISION AND DRAINAGE, ABSCESS
Anesthesia: General | Site: Abdomen

## 2014-11-28 MED ORDER — LACTATED RINGERS IV SOLN
INTRAVENOUS | Status: DC
  Administered 2014-11-28: 07:00:00 via INTRAVENOUS
  Filled 2014-11-28: qty 1000

## 2014-11-28 MED ORDER — FENTANYL CITRATE 0.05 MG/ML IJ SOLN
INTRAMUSCULAR | Status: AC
  Filled 2014-11-28: qty 4

## 2014-11-28 MED ORDER — ONDANSETRON HCL 4 MG/2ML IJ SOLN
INTRAMUSCULAR | Status: DC | PRN
  Administered 2014-11-28: 4 mg via INTRAVENOUS

## 2014-11-28 MED ORDER — MIDAZOLAM HCL 2 MG/2ML IJ SOLN
INTRAMUSCULAR | Status: AC
  Filled 2014-11-28: qty 2

## 2014-11-28 MED ORDER — PROPOFOL INFUSION 10 MG/ML OPTIME
INTRAVENOUS | Status: DC | PRN
  Administered 2014-11-28: 200 mL via INTRAVENOUS
  Administered 2014-11-28: 70 mL via INTRAVENOUS
  Administered 2014-11-28: 150 mL via INTRAVENOUS

## 2014-11-28 MED ORDER — PROMETHAZINE HCL 25 MG/ML IJ SOLN
6.2500 mg | INTRAMUSCULAR | Status: DC | PRN
  Filled 2014-11-28: qty 1

## 2014-11-28 MED ORDER — MIDAZOLAM HCL 5 MG/5ML IJ SOLN
INTRAMUSCULAR | Status: DC | PRN
  Administered 2014-11-28: 2 mg via INTRAVENOUS

## 2014-11-28 MED ORDER — BUPIVACAINE LIPOSOME 1.3 % IJ SUSP
INTRAMUSCULAR | Status: DC | PRN
  Administered 2014-11-28: 20 mL

## 2014-11-28 MED ORDER — SODIUM CHLORIDE 0.9 % IR SOLN
Status: DC | PRN
  Administered 2014-11-28: 3000 mL

## 2014-11-28 MED ORDER — SODIUM CHLORIDE 0.9 % IV SOLN
2000.0000 mg | INTRAVENOUS | Status: AC
  Administered 2014-11-28 (×2): 1000 mg via INTRAVENOUS
  Filled 2014-11-28: qty 2000

## 2014-11-28 MED ORDER — OXYCODONE HCL 5 MG PO TABS: 5.0000 mg | ORAL_TABLET | ORAL | Status: DC | PRN

## 2014-11-28 MED ORDER — DEXAMETHASONE SODIUM PHOSPHATE 10 MG/ML IJ SOLN
INTRAMUSCULAR | Status: DC | PRN
  Administered 2014-11-28: 10 mg via INTRAVENOUS

## 2014-11-28 MED ORDER — FENTANYL CITRATE 0.05 MG/ML IJ SOLN
25.0000 ug | INTRAMUSCULAR | Status: DC | PRN
  Filled 2014-11-28: qty 1

## 2014-11-28 MED ORDER — POLYMYXIN B SULFATE 500000 UNITS IJ SOLR
INTRAMUSCULAR | Status: DC | PRN
  Administered 2014-11-28: 500 mL

## 2014-11-28 MED ORDER — LIDOCAINE HCL (CARDIAC) 20 MG/ML IV SOLN
INTRAVENOUS | Status: DC | PRN
  Administered 2014-11-28: 60 mg via INTRAVENOUS

## 2014-11-28 MED ORDER — FENTANYL CITRATE 0.05 MG/ML IJ SOLN
INTRAMUSCULAR | Status: DC | PRN
  Administered 2014-11-28: 100 ug via INTRAVENOUS

## 2014-11-28 SURGICAL SUPPLY — 49 items
BLADE CLIPPER SURG (BLADE) IMPLANT
BLADE SURG 15 STRL LF DISP TIS (BLADE) ×1 IMPLANT
BLADE SURG 15 STRL SS (BLADE) ×2
BNDG GAUZE ELAST 4 BULKY (GAUZE/BANDAGES/DRESSINGS) ×2 IMPLANT
BRIEF STRETCH FOR OB PAD LRG (UNDERPADS AND DIAPERS) ×1 IMPLANT
CANISTER SUCTION 1200CC (MISCELLANEOUS) IMPLANT
CANISTER SUCTION 2500CC (MISCELLANEOUS) IMPLANT
CLEANER CAUTERY TIP 5X5 PAD (MISCELLANEOUS) ×1 IMPLANT
CLOTH BEACON ORANGE TIMEOUT ST (SAFETY) ×1 IMPLANT
COVER MAYO STAND STRL (DRAPES) ×2 IMPLANT
COVER TABLE BACK 60X90 (DRAPES) ×2 IMPLANT
DISSECTOR ROUND CHERRY 3/8 STR (MISCELLANEOUS) IMPLANT
DRAIN PENROSE 18X1/4 LTX STRL (WOUND CARE) IMPLANT
DRAPE PED LAPAROTOMY (DRAPES) ×2 IMPLANT
ELECT NDL BLADE 2-5/6 (NEEDLE) ×1 IMPLANT
ELECT NDL TIP 2.8 STRL (NEEDLE) IMPLANT
ELECT NEEDLE BLADE 2-5/6 (NEEDLE) ×2 IMPLANT
ELECT NEEDLE TIP 2.8 STRL (NEEDLE) IMPLANT
ELECT REM PT RETURN 9FT ADLT (ELECTROSURGICAL) ×2
ELECTRODE REM PT RTRN 9FT ADLT (ELECTROSURGICAL) ×1 IMPLANT
GAUZE PACKING IODOFORM 1/4X15 (GAUZE/BANDAGES/DRESSINGS) ×1 IMPLANT
GAUZE SPONGE 4X4 16PLY XRAY LF (GAUZE/BANDAGES/DRESSINGS) ×1 IMPLANT
GLOVE BIO SURGEON STRL SZ7.5 (GLOVE) ×2 IMPLANT
GOWN PREVENTION PLUS LG XLONG (DISPOSABLE) ×1 IMPLANT
GOWN STRL REIN XL XLG (GOWN DISPOSABLE) ×1 IMPLANT
GOWN STRL REUS W/ TWL LRG LVL3 (GOWN DISPOSABLE) IMPLANT
GOWN STRL REUS W/ TWL XL LVL3 (GOWN DISPOSABLE) IMPLANT
GOWN STRL REUS W/TWL LRG LVL3 (GOWN DISPOSABLE) ×2
GOWN STRL REUS W/TWL XL LVL3 (GOWN DISPOSABLE) ×2
IODOFORM GAUZE ×1 IMPLANT
LIQUID BAND (GAUZE/BANDAGES/DRESSINGS) ×1 IMPLANT
NEEDLE HYPO 22GX1.5 SAFETY (NEEDLE) IMPLANT
NS IRRIG 500ML POUR BTL (IV SOLUTION) ×1 IMPLANT
PACK BASIN DAY SURGERY FS (CUSTOM PROCEDURE TRAY) ×2 IMPLANT
PAD CLEANER CAUTERY TIP 5X5 (MISCELLANEOUS) ×1
PENCIL BUTTON HOLSTER BLD 10FT (ELECTRODE) ×2 IMPLANT
SPONGE GAUZE 4X4 12PLY STER LF (GAUZE/BANDAGES/DRESSINGS) ×1 IMPLANT
SUT MNCRL AB 4-0 PS2 18 (SUTURE) IMPLANT
SUT VIC AB 3-0 SH 27 (SUTURE) ×4
SUT VIC AB 3-0 SH 27X BRD (SUTURE) ×2 IMPLANT
SUT VICRYL 3 0 BR 18  UND (SUTURE)
SUT VICRYL 3 0 BR 18 UND (SUTURE) ×1 IMPLANT
SYR BULB IRRIGATION 50ML (SYRINGE) ×2 IMPLANT
SYR CONTROL 10ML LL (SYRINGE) ×2 IMPLANT
TOWEL OR 17X24 6PK STRL BLUE (TOWEL DISPOSABLE) ×5 IMPLANT
TRAY DSU PREP LF (CUSTOM PROCEDURE TRAY) ×2 IMPLANT
TUBE CONNECTING 12X1/4 (SUCTIONS) ×2 IMPLANT
WATER STERILE IRR 500ML POUR (IV SOLUTION) ×2 IMPLANT
YANKAUER SUCT BULB TIP NO VENT (SUCTIONS) ×2 IMPLANT

## 2014-11-28 NOTE — Anesthesia Procedure Notes (Signed)
Procedure Name: LMA Insertion Date/Time: 11/28/2014 7:37 AM Performed by: Tyrone NineSAUVE, Zakyla Tonche F Pre-anesthesia Checklist: Patient identified, Timeout performed, Emergency Drugs available, Suction available and Patient being monitored Patient Re-evaluated:Patient Re-evaluated prior to inductionOxygen Delivery Method: Circle system utilized Preoxygenation: Pre-oxygenation with 100% oxygen Intubation Type: IV induction Ventilation: Mask ventilation without difficulty LMA: LMA inserted LMA Size: 4.0 Number of attempts: 1 Airway Equipment and Method: Bite block Placement Confirmation: breath sounds checked- equal and bilateral and positive ETCO2 Tube secured with: Tape Dental Injury: Teeth and Oropharynx as per pre-operative assessment

## 2014-11-28 NOTE — Transfer of Care (Signed)
Immediate Anesthesia Transfer of Care Note  Patient: Jennifer MartinetSusan N Pellum  Procedure(s) Performed: Procedure(s): INCISION AND DRAINAGE ABSCESS (N/A)  Patient Location: PACU  Anesthesia Type:General  Level of Consciousness: awake, alert , oriented and patient cooperative  Airway & Oxygen Therapy: Patient Spontanous Breathing and Patient connected to nasal cannula oxygen  Post-op Assessment: Report given to RN and Post -op Vital signs reviewed and stable  Post vital signs: Reviewed and stable  Last Vitals:  Filed Vitals:   11/28/14 0618  BP: 102/68  Pulse: 57  Temp: 37 C  Resp: 16    Complications: No apparent anesthesia complications

## 2014-11-28 NOTE — Op Note (Signed)
Preoperative diagnosis:  1. Peri-incisional abscess   Postoperative diagnosis:  1. same   Procedure: 1. Incision and drainage  Surgeon: Crist FatBenjamin W. Meryn Sarracino, MD  Anesthesia: General  Complications: None  Intraoperative findings: There was a pocket of infection inferior the suprapubic region which was opened and drained and then packed with half-inch iodoform dressing. I made a second counterincision on the right inferior aspect of the incision and opened up this pocket laterally and again packed it with half-inch iodoform dressing.  EBL: Minimal  Specimens: None  Indication: Jennifer Sanford is a [age] y.o. patient who is status post right ureteral reimplant. We have been treating her for small wound infections over the last 3 weeks. Most recently she developed a small fluid collection at the inferior right aspect of the wound. Ultrasound showed a collection approximately 5 cm long and several centimeters wide. She opted to proceed with incision and drainage under general anesthesia. After reviewing the management options for treatment, he elected to proceed with the above surgical procedure(s). We have discussed the potential benefits and risks of the procedure, side effects of the proposed treatment, the likelihood of the patient achieving the goals of the procedure, and any potential problems that might occur during the procedure or recuperation. Informed consent has been obtained.  Description of procedure:  The patient was taken to the operating room and general anesthesia was induced.  The patient was then prepped and draped in the routine sterile fashion. I then made a 1 cm cruciate incision the right lateral aspect of the inferior portion of the patient's incision. Using Kelly's I dissected down into the infected pocket of fluid and drained it. I then did the same on the inferior aspect of the patient's midline incision over the pubic bone. I then used the pulse lavage and irrigated the  wound with several liters of normal saline. I then irrigated the wound with an antibiotic solution. I then packed it with half-inch iodoform packing strip. I injected the wound with 20 mL of X Perella. 4 x 4's were then placed over the packing and taped. The patient subsequently awoke and returned to PACU in good condition.   Crist FatBenjamin W. Shekinah Pitones, M.D.

## 2014-11-28 NOTE — Interval H&P Note (Signed)
History and Physical Interval Note: RRR  CTA-B  I&D of incisional abscess 11/28/2014 7:22 AM  Jennifer MartinetSusan N Rask  has presented today for surgery, with the diagnosis of WOUND INFECTION  The various methods of treatment have been discussed with the patient and family. After consideration of risks, benefits and other options for treatment, the patient has consented to  Procedure(s): INCISION AND DRAINAGE ABSCESS (N/A) as a surgical intervention .  The patient's history has been reviewed, patient examined, no change in status, stable for surgery.  I have reviewed the patient's chart and labs.  Questions were answered to the patient's satisfaction.     Berniece SalinesHERRICK, Doy Taaffe W

## 2014-11-28 NOTE — Discharge Instructions (Signed)
Pack incisions twice daily using the 1/2 packing strips provided.   Dr. Marlou PorchHerrick will contact this afternoon to review the findings and additional instructions.   Activity:  You are encouraged to ambulate frequently (about every hour during waking hours) to help prevent blood clots from forming in your legs or lungs.  However, you should not engage in any heavy lifting (> 10-15 lbs), strenuous activity, or straining.   Diet: You should advance your diet as instructed by your physician.  It will be normal to have some bloating, nausea, and abdominal discomfort intermittently.   Prescriptions:  You will be provided a prescription for pain medication to take as needed.  If your pain is not severe enough to require the prescription pain medication, you may take extra strength Tylenol instead which will have less side effects.  You should also take a prescribed stool softener to avoid straining with bowel movements as the prescription pain medication may constipate you.    What to call us about: You should call the office [phone]) if you develop fever > 101 or develop persistent vomiting. Activity:  You are encouraged to ambulate frequently (about every hour during waking hours) to help prevent blood clots from forming in your legs or lungs.  However, you should not engage in any heavy lifting (> 10-15 lbs), strenuous activity, or straining.    Bupivacaine Liposomal Suspension for Injection What is this medicine? BUPIVACAINE LIPOSOMAL (bue PIV a kane LIP oh som al) is an anesthetic. It causes loss of feeling in the skin or other tissues. It is used to prevent and to treat pain from some procedures. This medicine may be used for other purposes; ask your health care provider or pharmacist if you have questions. COMMON BRAND NAME(S): EXPAREL What should I tell my health care provider before I take this medicine? They need to know if you have any of these conditions: -heart disease -kidney  disease -liver disease -an unusual or allergic reaction to bupivacaine, other medicines, foods, dyes, or preservatives -pregnant or trying to get pregnant -breast-feeding How should I use this medicine? This medicine is for injection into the affected area. It is given by a health care professional in a hospital or clinic setting. Talk to your pediatrician regarding the use of this medicine in children. Special care may be needed. Overdosage: If you think you've taken too much of this medicine contact a poison control center or emergency room at once. Overdosage: If you think you have taken too much of this medicine contact a poison control center or emergency room at once. NOTE: This medicine is only for you. Do not share this medicine with others. What if I miss a dose? This does not apply. What may interact with this medicine? -other anesthetics This list may not describe all possible interactions. Give your health care provider a list of all the medicines, herbs, non-prescription drugs, or dietary supplements you use. Also tell them if you smoke, drink alcohol, or use illegal drugs. Some items may interact with your medicine. What should I watch for while using this medicine? Your condition will be monitored carefully while you are receiving this medicine. Be careful to avoid injury while the area is numb and you are not aware of pain. Do not use another medicine that contains bupivacaine for 96 hours after receiving bupivacaine liposomal. You may have more side effects. Talk to your doctor if you have questions. What side effects may I notice from receiving this medicine? Side effects that  you should report to your doctor or health care professional as soon as possible: -allergic reactions like skin rash, itching or hives, swelling of the face, lips, or tongue -breathing problems -changes in vision -dizziness -fast or slow, irregular heartbeat -joint pain, stiffness, or loss of  motion -seizures Side effects that usually do not require medical attention (Report these to your doctor or health care professional if they continue or are bothersome.): -constipation -irritation at site where injected -nausea, vomiting -tiredness This list may not describe all possible side effects. Call your doctor for medical advice about side effects. You may report side effects to FDA at 1-800-FDA-1088. Where should I keep my medicine? This drug is given in a hospital or clinic and will not be stored at home. NOTE: This sheet is a summary. It may not cover all possible information. If you have questions about this medicine, talk to your doctor, pharmacist, or health care provider.  2015, [employer]vier/Gold Standard. (2013-02-02 12:35:57)      Post Anesthesia Home Care Instructions  Activity: Get plenty of rest for the remainder of the day. A responsible adult should stay with you for 24 hours following the procedure.  For the next 24 hours, DO NOT: -Drive a car -Advertising copywriter -Drink alcoholic beverages -Take any medication unless instructed by your physician -Make any legal decisions or sign important papers.  Meals: Start with liquid foods such as gelatin or soup. Progress to regular foods as tolerated. Avoid greasy, spicy, heavy foods. If nausea and/or vomiting occur, drink only clear liquids until the nausea and/or vomiting subsides. Call your physician if vomiting continues.  Special Instructions/Symptoms: Your throat may feel dry or sore from the anesthesia or the breathing tube placed in your throat during surgery. If this causes discomfort, gargle with warm salt water. The discomfort should disappear within 24 hours.

## 2014-11-28 NOTE — H&P (Signed)
55F s/p right open ureteral reimplant on 09/13/14. Her post-op course was complicated by a pSBO from adhesions which was managed conservatively with bowel rest and TPN.  Foley catheter was removed in the hospital after a negative cystogram. She was treated for a pseudomonas UTI which caused her to have a lot of gross hematuria. Her staples were removed in the hospital as well.  Stent removed on 10/27/14. Renal ultrasound showed minimal hydro.  Interval: The patient's wound is continuing to heal well, her wound edges are healthy appearing with good granulation tissue. There is a small area of tenderness the most inferior aspect on the right side. There is no drainage from this area. The patient has no fevers or chills. She has no ongoing pain. He is eating well. Her bowels are moving without issue. She denies any flank pain or voiding symptoms.   Vitals Vital Signs [Data Includes: Last 1 Day]  Recorded: 23Feb2016 02:10PM  Blood Pressure: 113 / 77 Temperature: 98 F Heart Rate: 83  Physical Exam Lower midline incision partially opened with healthy granulating tissue approximately 5 mm in width. There is a small indurated area in the right inferior aspect that is nonpurulent.   Bladder/suprapubic ultrasound.  Superior to the pubic bone on the patient's right side there is an elevated area of the skin with a hypoechoic area subcutaneous, likely a pocket of fluid or purulence measuring 5.1 cm 1.2 cm 3.5 cm. This is likely peri-incisional abscess.  The bladder measures 212cc without other abnormalities.   Imp/Plan: Miss Huston FoleyBrady has a subcutaneous fluid collection, likely an organizing abscess at the inferior aspect on the right side of her lower midline incision. Her plans initially were to drain this abscess in clinic. However on second thought the patient would like to have this done more aggressively and more thoroughly under general anesthesia. I think this is a reasonable approach. As such, we'll  get this done as soon as possible.

## 2014-11-28 NOTE — Anesthesia Postprocedure Evaluation (Signed)
  Anesthesia Post-op Note  Patient: Simonne MartinetSusan N Scheiderer  Procedure(s) Performed: Procedure(s) (LRB): INCISION AND DRAINAGE ABSCESS (N/A)  Patient Location: PACU  Anesthesia Type: General  Level of Consciousness: awake and alert   Airway and Oxygen Therapy: Patient Spontanous Breathing  Post-op Pain: mild  Post-op Assessment: Post-op Vital signs reviewed, Patient's Cardiovascular Status Stable, Respiratory Function Stable, Patent Airway and No signs of Nausea or vomiting  Last Vitals:  Filed Vitals:   11/28/14 1044  BP: 124/84  Pulse: 54  Temp: 36.6 C  Resp: 16    Post-op Vital Signs: stable   Complications: No apparent anesthesia complications

## 2014-11-29 ENCOUNTER — Encounter (HOSPITAL_BASED_OUTPATIENT_CLINIC_OR_DEPARTMENT_OTHER): Payer: Self-pay | Admitting: Urology

## 2014-11-30 NOTE — Addendum Note (Signed)
Addendum  created 11/30/14 1223 by Sherrian DiversBruce Cataleya Cristina, MD   Modules edited: Anesthesia Responsible Staff

## 2014-12-27 IMAGING — CR DG CHEST 2V
2 series · 2 of 2 positions shown · non-contrast
Comparison: None.

CLINICAL DATA: Preop

CHEST - 2 VIEW

[w chest pa]
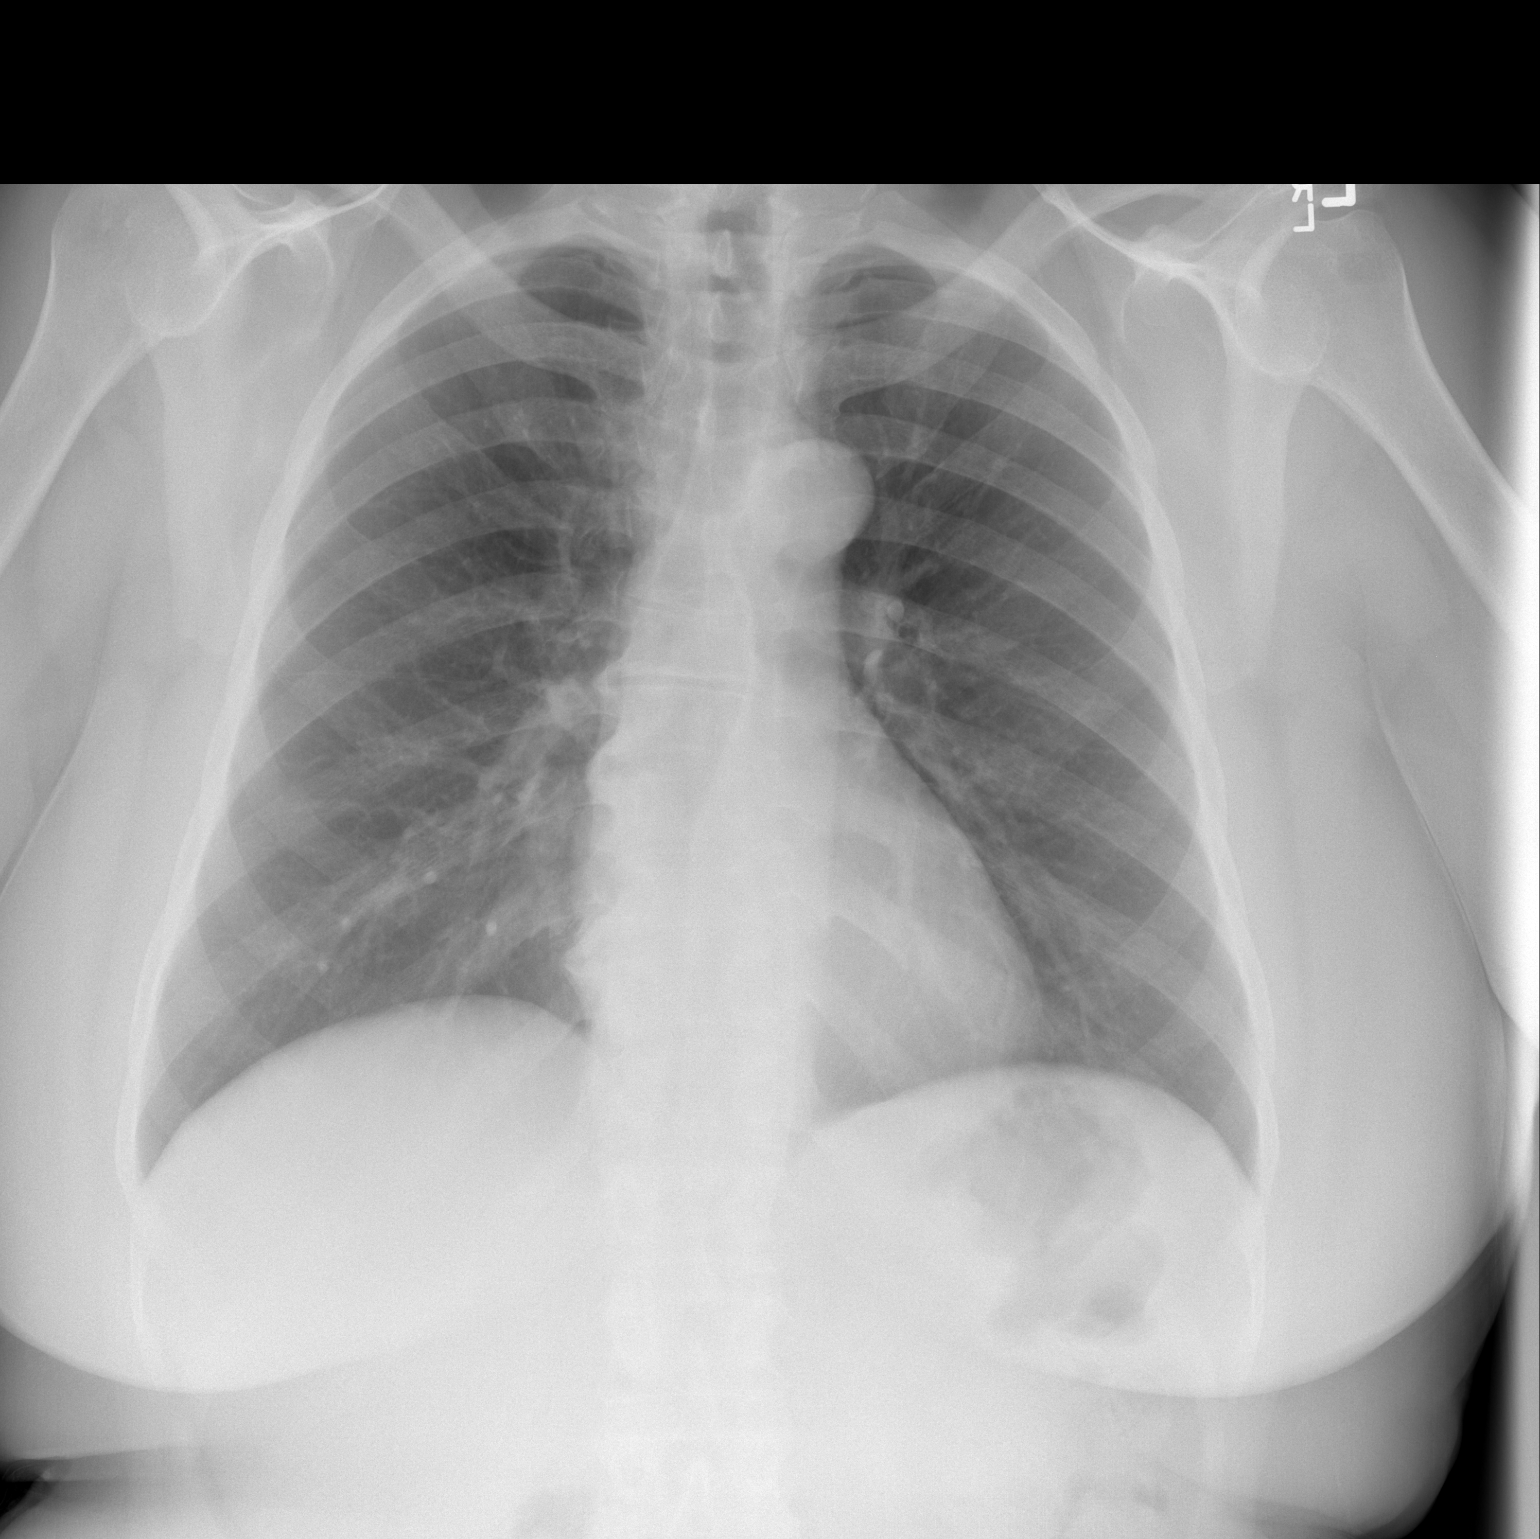

[w chest lat]
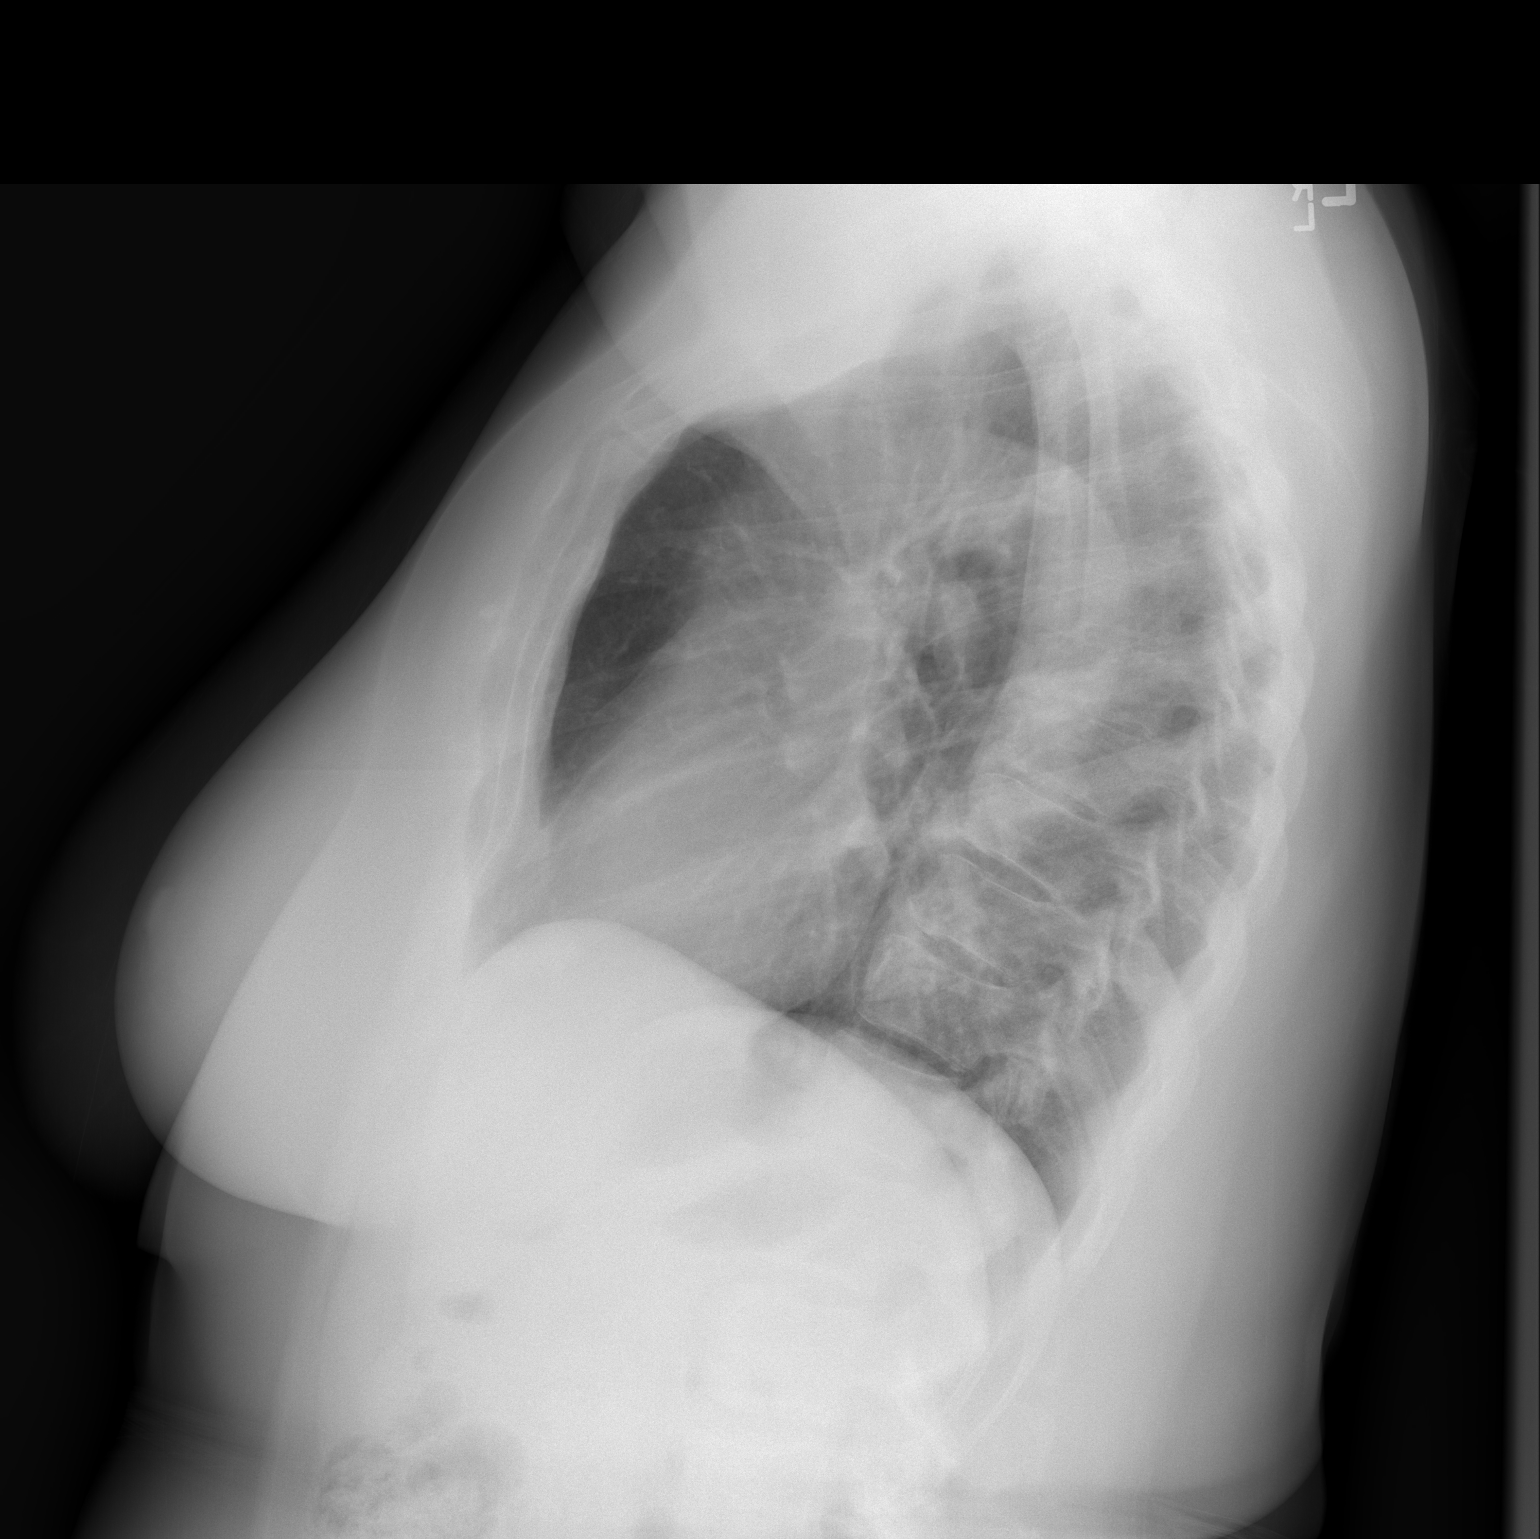

[2 of 2 positions shown; findings below may reference images not displayed]

FINDINGS: Normal mediastinum and cardiac silhouette.  Normal
pulmonary  vasculature.  No evidence of effusion, infiltrate, or
pneumothorax.  No acute bony abnormality.  Degenerative
osteophytosis of the thoracic spine.
IMPRESSION: No acute cardiopulmonary process.

## 2015-01-06 LAB — HM COLONOSCOPY

## 2015-02-14 ENCOUNTER — Other Ambulatory Visit: Payer: Self-pay | Admitting: Urology

## 2015-02-14 ENCOUNTER — Encounter (HOSPITAL_BASED_OUTPATIENT_CLINIC_OR_DEPARTMENT_OTHER): Payer: Self-pay | Admitting: *Deleted

## 2015-02-14 NOTE — Progress Notes (Signed)
NPO AFTER MN.  ARRIVE AT  0945.  NEEDS ISTAT.  CURRENT EKG IN CHART AND EPIC.  WILL TAKE CELEXA, PROTONIX, AND FENOFIBRATE AM DOS W/ SIPS OF WATER.

## 2015-02-14 NOTE — Progress Notes (Signed)
   02/14/15 0948  Call Complete  Pre-op Call Complete Yes  History Interview History Complete

## 2015-02-14 NOTE — Progress Notes (Signed)
   02/14/15 0945  Pre-op Phone Call  Surgery Date Verified 02/15/15  Arrival Time Verified 0945  Surgery Location Verified [facility]  Medical History Reviewed Yes  Does the patient have diabetes? No  Patient verbalizes understanding of bowel prep? N/A  Med Rec Completed Yes  Take the Following Meds the Morning of Surgery celexa, protonix, fenofibrate  Recent  Lab Work, EKG, CXR? Yes  NPO (Including gum & candy) After midnight  Responsible adult to drive and be with you for 24 hours? Yes  Name & Phone Number for Ride/Caregiver friend--  adrienne Angolaisrael  No Jewelry, money, nail polish or make-up.  No lotions, powders, perfumes. No shaving  48 hrs. prior to surgery. Yes  Contacts, Dentures & Glasses Will Have to be Removed Before OR. Yes  Bring any papers or x-rays with you that your surgeon gave you. No  Call this number the morning of surgery  with any problems that may cancel your surgery. [phone]

## 2015-02-15 ENCOUNTER — Ambulatory Visit (HOSPITAL_BASED_OUTPATIENT_CLINIC_OR_DEPARTMENT_OTHER): Payer: 59 | Admitting: Anesthesiology

## 2015-02-15 ENCOUNTER — Ambulatory Visit (HOSPITAL_BASED_OUTPATIENT_CLINIC_OR_DEPARTMENT_OTHER)
Admission: RE | Admit: 2015-02-15 | Discharge: 2015-02-15 | Disposition: A | Discharge status: Home / Self Care | Payer: 59 | Source: Ambulatory Visit | Attending: Urology | Admitting: Urology

## 2015-02-15 ENCOUNTER — Encounter (HOSPITAL_BASED_OUTPATIENT_CLINIC_OR_DEPARTMENT_OTHER)
Admission: RE | Disposition: A | Discharge status: Home / Self Care | Payer: Self-pay | Source: Ambulatory Visit | Attending: Urology

## 2015-02-15 ENCOUNTER — Encounter (HOSPITAL_BASED_OUTPATIENT_CLINIC_OR_DEPARTMENT_OTHER): Payer: Self-pay | Admitting: Anesthesiology

## 2015-02-15 DIAGNOSIS — Z9889 Other specified postprocedural states: Secondary | ICD-10-CM

## 2015-02-15 DIAGNOSIS — M1990 Unspecified osteoarthritis, unspecified site: Secondary | ICD-10-CM | POA: Insufficient documentation

## 2015-02-15 DIAGNOSIS — K219 Gastro-esophageal reflux disease without esophagitis: Secondary | ICD-10-CM | POA: Insufficient documentation

## 2015-02-15 DIAGNOSIS — F329 Major depressive disorder, single episode, unspecified: Secondary | ICD-10-CM | POA: Insufficient documentation

## 2015-02-15 DIAGNOSIS — D649 Anemia, unspecified: Secondary | ICD-10-CM | POA: Diagnosis not present

## 2015-02-15 DIAGNOSIS — I10 Essential (primary) hypertension: Secondary | ICD-10-CM | POA: Diagnosis not present

## 2015-02-15 DIAGNOSIS — T814XXA Infection following a procedure, initial encounter: Secondary | ICD-10-CM | POA: Insufficient documentation

## 2015-02-15 DIAGNOSIS — E780 Pure hypercholesterolemia: Secondary | ICD-10-CM | POA: Diagnosis not present

## 2015-02-15 HISTORY — PX: INCISION AND DRAINAGE ABSCESS: SHX5864

## 2015-02-15 LAB — POCT I-STAT 4, (NA,K, GLUC, HGB,HCT)
Glucose, Bld: 83 mg/dL (ref 65–99)
HCT: 39 % (ref 36.0–46.0)
Hemoglobin: 13.3 g/dL (ref 12.0–15.0)
Potassium: 4.3 mmol/L (ref 3.5–5.1)
Sodium: 141 mmol/L (ref 135–145)

## 2015-02-15 LAB — POCT HEMOGLOBIN-HEMACUE: Hemoglobin: 13.5 g/dL (ref 12.0–15.0)

## 2015-02-15 SURGERY — INCISION AND DRAINAGE, ABSCESS
Anesthesia: General | Site: Abdomen

## 2015-02-15 MED ORDER — FENTANYL CITRATE (PF) 100 MCG/2ML IJ SOLN
INTRAMUSCULAR | Status: DC | PRN
  Administered 2015-02-15: 100 ug via INTRAVENOUS

## 2015-02-15 MED ORDER — PROPOFOL 10 MG/ML IV BOLUS
INTRAVENOUS | Status: DC | PRN
  Administered 2015-02-15: 200 mg via INTRAVENOUS
  Administered 2015-02-15: 100 mg via INTRAVENOUS

## 2015-02-15 MED ORDER — ONDANSETRON HCL 4 MG/2ML IJ SOLN
INTRAMUSCULAR | Status: DC | PRN
  Administered 2015-02-15: 4 mg via INTRAVENOUS

## 2015-02-15 MED ORDER — ACETAMINOPHEN 10 MG/ML IV SOLN
INTRAVENOUS | Status: DC | PRN
  Administered 2015-02-15: 1000 mg via INTRAVENOUS

## 2015-02-15 MED ORDER — DEXAMETHASONE SODIUM PHOSPHATE 4 MG/ML IJ SOLN
INTRAMUSCULAR | Status: DC | PRN
  Administered 2015-02-15: 10 mg via INTRAVENOUS

## 2015-02-15 MED ORDER — HYDROMORPHONE HCL 1 MG/ML IJ SOLN
0.2500 mg | INTRAMUSCULAR | Status: DC | PRN
  Filled 2015-02-15: qty 1

## 2015-02-15 MED ORDER — VANCOMYCIN HCL 10 G IV SOLR
2000.0000 mg | INTRAVENOUS | Status: AC
  Administered 2015-02-15: 2000 mg via INTRAVENOUS
  Filled 2015-02-15: qty 2000

## 2015-02-15 MED ORDER — SODIUM CHLORIDE 0.9 % IR SOLN
Status: DC | PRN
  Administered 2015-02-15: 10:00:00

## 2015-02-15 MED ORDER — OXYCODONE HCL 5 MG PO TABS: 5.0000 mg | ORAL_TABLET | ORAL | Status: DC | PRN

## 2015-02-15 MED ORDER — BUPIVACAINE HCL 0.5 % IJ SOLN
INTRAMUSCULAR | Status: DC | PRN
  Administered 2015-02-15: 20 mL

## 2015-02-15 MED ORDER — KETOROLAC TROMETHAMINE 30 MG/ML IJ SOLN
INTRAMUSCULAR | Status: DC | PRN
  Administered 2015-02-15: 30 mg via INTRAVENOUS

## 2015-02-15 MED ORDER — SODIUM CHLORIDE 0.9 % IJ SOLN
INTRAMUSCULAR | Status: DC | PRN
  Administered 2015-02-15: 20 mL

## 2015-02-15 MED ORDER — PROMETHAZINE HCL 25 MG/ML IJ SOLN
6.2500 mg | INTRAMUSCULAR | Status: DC | PRN
  Filled 2015-02-15: qty 1

## 2015-02-15 MED ORDER — TRAMADOL HCL 50 MG PO TABS: 50.0000 mg | ORAL_TABLET | Freq: Four times a day (QID) | ORAL | Status: DC | PRN

## 2015-02-15 MED ORDER — HYDROCODONE-ACETAMINOPHEN 7.5-325 MG PO TABS
1.0000 | ORAL_TABLET | Freq: Once | ORAL | Status: DC | PRN
  Filled 2015-02-15: qty 1

## 2015-02-15 MED ORDER — FENTANYL CITRATE (PF) 100 MCG/2ML IJ SOLN
INTRAMUSCULAR | Status: AC
  Filled 2015-02-15: qty 4

## 2015-02-15 MED ORDER — LIDOCAINE HCL (CARDIAC) 20 MG/ML IV SOLN
INTRAVENOUS | Status: DC | PRN
  Administered 2015-02-15: 100 mg via INTRAVENOUS

## 2015-02-15 MED ORDER — LACTATED RINGERS IV SOLN
INTRAVENOUS | Status: DC
Start: 2015-02-15 — End: 2015-02-15
  Administered 2015-02-15: 10:00:00 via INTRAVENOUS
  Filled 2015-02-15: qty 1000

## 2015-02-15 MED ORDER — EPHEDRINE SULFATE 50 MG/ML IJ SOLN
INTRAMUSCULAR | Status: DC | PRN
  Administered 2015-02-15: 15 mg via INTRAVENOUS
  Administered 2015-02-15: 10 mg via INTRAVENOUS

## 2015-02-15 MED ORDER — MIDAZOLAM HCL 2 MG/2ML IJ SOLN
INTRAMUSCULAR | Status: AC
  Filled 2015-02-15: qty 2

## 2015-02-15 MED ORDER — BUPIVACAINE LIPOSOME 1.3 % IJ SUSP
INTRAMUSCULAR | Status: DC | PRN
  Administered 2015-02-15: 20 mL

## 2015-02-15 SURGICAL SUPPLY — 56 items
BLADE CLIPPER SURG (BLADE) IMPLANT
BLADE SURG 15 STRL LF DISP TIS (BLADE) ×1 IMPLANT
BLADE SURG 15 STRL SS (BLADE) ×2
BNDG GAUZE ELAST 4 BULKY (GAUZE/BANDAGES/DRESSINGS) ×1 IMPLANT
BRIEF STRETCH FOR OB PAD LRG (UNDERPADS AND DIAPERS) ×2 IMPLANT
CANISTER SUCTION 1200CC (MISCELLANEOUS) IMPLANT
CANISTER SUCTION 2500CC (MISCELLANEOUS) ×1 IMPLANT
CLEANER CAUTERY TIP 5X5 PAD (MISCELLANEOUS) ×1 IMPLANT
CLOTH BEACON ORANGE TIMEOUT ST (SAFETY) IMPLANT
COVER BACK TABLE 60X90IN (DRAPES) ×2 IMPLANT
COVER MAYO STAND STRL (DRAPES) ×2 IMPLANT
CURAD PLAIN PACKING ×1 IMPLANT
DISSECTOR ROUND CHERRY 3/8 STR (MISCELLANEOUS) IMPLANT
DRAIN PENROSE 18X1/4 LTX STRL (WOUND CARE) IMPLANT
DRAPE PED LAPAROTOMY (DRAPES) ×2 IMPLANT
DRAPE UTILITY 15X26 TOWEL STRL (DRAPES) ×1 IMPLANT
DRSG PAD ABDOMINAL 8X10 ST (GAUZE/BANDAGES/DRESSINGS) ×1 IMPLANT
ELECT NDL BLADE 2-5/6 (NEEDLE) ×1 IMPLANT
ELECT NDL TIP 2.8 STRL (NEEDLE) IMPLANT
ELECT NEEDLE BLADE 2-5/6 (NEEDLE) ×2 IMPLANT
ELECT NEEDLE TIP 2.8 STRL (NEEDLE) IMPLANT
ELECT REM PT RETURN 9FT ADLT (ELECTROSURGICAL) ×2
ELECTRODE REM PT RTRN 9FT ADLT (ELECTROSURGICAL) ×1 IMPLANT
GAUZE SPONGE 4X4 12PLY STRL (GAUZE/BANDAGES/DRESSINGS) ×1 IMPLANT
GAUZE SPONGE 4X4 16PLY XRAY LF (GAUZE/BANDAGES/DRESSINGS) IMPLANT
GLOVE BIO SURGEON STRL SZ7.5 (GLOVE) ×2 IMPLANT
GLOVE BIOGEL M 6.5 STRL (GLOVE) ×1 IMPLANT
GLOVE BIOGEL PI IND STRL 6.5 (GLOVE) IMPLANT
GLOVE BIOGEL PI IND STRL 7.5 (GLOVE) IMPLANT
GLOVE BIOGEL PI INDICATOR 6.5 (GLOVE) ×2
GLOVE BIOGEL PI INDICATOR 7.5 (GLOVE) ×1
GOWN STRL REUS W/ TWL LRG LVL3 (GOWN DISPOSABLE) ×1 IMPLANT
GOWN STRL REUS W/ TWL XL LVL3 (GOWN DISPOSABLE) ×1 IMPLANT
GOWN STRL REUS W/TWL LRG LVL3 (GOWN DISPOSABLE) ×2
GOWN STRL REUS W/TWL XL LVL3 (GOWN DISPOSABLE) ×2
LIQUID BAND (GAUZE/BANDAGES/DRESSINGS) ×1 IMPLANT
NEEDLE HYPO 22GX1.5 SAFETY (NEEDLE) IMPLANT
NS IRRIG 500ML POUR BTL (IV SOLUTION) ×2 IMPLANT
PACK BASIN DAY SURGERY FS (CUSTOM PROCEDURE TRAY) ×2 IMPLANT
PAD CLEANER CAUTERY TIP 5X5 (MISCELLANEOUS) ×1
PENCIL BUTTON HOLSTER BLD 10FT (ELECTRODE) ×2 IMPLANT
SPONGE GAUZE 4X4 12PLY STER LF (GAUZE/BANDAGES/DRESSINGS) ×1 IMPLANT
SPONGE LAP 4X18 X RAY DECT (DISPOSABLE) ×1 IMPLANT
SUT MNCRL AB 4-0 PS2 18 (SUTURE) IMPLANT
SUT VIC AB 3-0 SH 27 (SUTURE)
SUT VIC AB 3-0 SH 27X BRD (SUTURE) ×2 IMPLANT
SUT VICRYL 3 0 BR 18  UND (SUTURE)
SUT VICRYL 3 0 BR 18 UND (SUTURE) ×1 IMPLANT
SWAB CULTURE LIQ STUART DBL (MISCELLANEOUS) ×4 IMPLANT
SYR BULB IRRIGATION 50ML (SYRINGE) ×2 IMPLANT
SYR CONTROL 10ML LL (SYRINGE) ×2 IMPLANT
TOWEL OR 17X24 6PK STRL BLUE (TOWEL DISPOSABLE) ×4 IMPLANT
TRAY DSU PREP LF (CUSTOM PROCEDURE TRAY) ×2 IMPLANT
TUBE CONNECTING 12X1/4 (SUCTIONS) ×2 IMPLANT
WATER STERILE IRR 500ML POUR (IV SOLUTION) ×1 IMPLANT
YANKAUER SUCT BULB TIP NO VENT (SUCTIONS) ×2 IMPLANT

## 2015-02-15 NOTE — Anesthesia Procedure Notes (Signed)
Procedure Name: LMA Insertion Date/Time: 02/15/2015 10:46 AM Performed by: Tyrone NineSAUVE, Sharonann Malbrough F Pre-anesthesia Checklist: Patient identified, Timeout performed, Emergency Drugs available, Suction available and Patient being monitored Patient Re-evaluated:Patient Re-evaluated prior to inductionOxygen Delivery Method: Circle system utilized Preoxygenation: Pre-oxygenation with 100% oxygen Intubation Type: IV induction Ventilation: Mask ventilation without difficulty LMA: LMA inserted LMA Size: 4.0 Number of attempts: 1 Airway Equipment and Method: Bite block Placement Confirmation: positive ETCO2 and breath sounds checked- equal and bilateral Tube secured with: Tape Dental Injury: Teeth and Oropharynx as per pre-operative assessment

## 2015-02-15 NOTE — Discharge Instructions (Signed)
Pack wound twice daily with moist gauze. Take Tylenol for mild pain. Take Tramadol for moderate pain. Take Oxycodone for break through pain.   Diet: You should advance your diet as instructed by your physician.  It will be normal to have some bloating, nausea, and abdominal discomfort intermittently.   Prescriptions:  You will be provided a prescription for pain medication to take as needed.  If your pain is not severe enough to require the prescription pain medication, you may take extra strength Tylenol instead which will have less side effects.  You should also take a prescribed stool softener to avoid straining with bowel movements as the prescription pain medication may constipate you.   What to call us about: You should call the office [phone]) if you develop fever > 101 or develop persistent vomiting. Activity:  You are encouraged to ambulate frequently (about every hour during waking hours) to help prevent blood clots from forming in your legs or lungs.  However, you should not engage in any heavy lifting (> 10-15 lbs), strenuous activity, or straining.      Post Anesthesia Home Care Instructions  Activity: Get plenty of rest for the remainder of the day. A responsible adult should stay with you for 24 hours following the procedure.  For the next 24 hours, DO NOT: -Drive a car -Advertising copywriterperate machinery -Drink alcoholic beverages -Take any medication unless instructed by your physician -Make any legal decisions or sign important papers.  Meals: Start with liquid foods such as gelatin or soup. Progress to regular foods as tolerated. Avoid greasy, spicy, heavy foods. If nausea and/or vomiting occur, drink only clear liquids until the nausea and/or vomiting subsides. Call your physician if vomiting continues.  Special Instructions/Symptoms: Your throat may feel dry or sore from the anesthesia or the breathing tube placed in your throat during surgery. If this causes discomfort,  gargle with warm salt water. The discomfort should disappear within 24 hours.  If you had a scopolamine patch placed behind your ear for the management of post- operative nausea and/or vomiting:  1. The medication in the patch is effective for 72 hours, after which it should be removed.  Wrap patch in a tissue and discard in the trash. Wash hands thoroughly with soap and water. 2. You may remove the patch earlier than 72 hours if you experience unpleasant side effects which may include dry mouth, dizziness or visual disturbances. 3. Avoid touching the patch. Wash your hands with soap and water after contact with the patch.   Information for Discharge Teaching: EXPAREL (bupivacaine liposome injectable suspension)   Your surgeon gave you EXPAREL(bupivacaine) in your surgical incision to help control your pain after surgery.   EXPAREL is a local anesthetic that provides pain relief by numbing the tissue around the surgical site.  EXPAREL is designed to release pain medication over time and can control pain for up to 72 hours.  Depending on how you respond to EXPAREL, you may require less pain medication during your recovery.  Possible side effects:  Temporary loss of sensation or ability to move in the area where bupivacaine was injected.  Nausea, vomiting, constipation  Rarely, numbness and tingling in your mouth or lips, lightheadedness, or anxiety may occur.  Call your doctor right away if you think you may be experiencing any of these sensations, or if you have other questions regarding possible side effects.  Follow all other discharge instructions given to you by your surgeon or nurse. Eat a healthy diet and  drink plenty of water or other fluids.  If you return to the hospital for any reason within 96 hours following the administration of EXPAREL, please inform your health care providers.

## 2015-02-15 NOTE — Anesthesia Preprocedure Evaluation (Addendum)
Anesthesia Evaluation  Patient identified by MRN, date of birth, ID band Patient awake    Reviewed: Allergy & Precautions, NPO status , Patient's Chart, lab work & pertinent test results  History of Anesthesia Complications (+) history of anesthetic complications  Airway Mallampati: II  TM Distance: >3 FB Neck ROM: Full    Dental no notable dental hx. (+) Dental Advisory Given, Teeth Intact, Implants,    Pulmonary neg pulmonary ROS,  breath sounds clear to auscultation  Pulmonary exam normal       Cardiovascular Exercise Tolerance: Good hypertension, Normal cardiovascular examRhythm:Regular Rate:Normal  Off blood pressure medicines since late December because her blood pressure has been controlled.   Neuro/Psych PSYCHIATRIC DISORDERS Depression Claustrophobianegative neurological ROS     GI/Hepatic Neg liver ROS, GERD-  Medicated and Controlled,  Endo/Other  negative endocrine ROS  Renal/GU negative Renal ROS  negative genitourinary   Musculoskeletal  (+) Arthritis -,   Abdominal   Peds negative pediatric ROS (+)  Hematology  (+) anemia ,   Anesthesia Other Findings Permanent bridge  Reproductive/Obstetrics negative OB ROS                            Anesthesia Physical  Anesthesia Plan  ASA: II  Anesthesia Plan: General   Post-op Pain Management:    Induction: Intravenous  Airway Management Planned: LMA  Additional Equipment:   Intra-op Plan:   Post-operative Plan: Extubation in OR  Informed Consent: I have reviewed the patients History and Physical, chart, labs and discussed the procedure including the risks, benefits and alternatives for the proposed anesthesia with the patient or authorized representative who has indicated his/her understanding and acceptance.   Dental advisory given  Plan Discussed with: CRNA  Anesthesia Plan Comments:         Anesthesia Quick  Evaluation

## 2015-02-15 NOTE — Transfer of Care (Signed)
Immediate Anesthesia Transfer of Care Note  Patient: Jennifer Sanford  Procedure(s) Performed: Procedure(s): INCISION AND DRAINAGE OF WOUND (N/A)  Patient Location: PACU  Anesthesia Type:General  Level of Consciousness: awake, alert , oriented and patient cooperative  Airway & Oxygen Therapy: Patient Spontanous Breathing and Patient connected to nasal cannula oxygen  Post-op Assessment: Report given to RN and Post -op Vital signs reviewed and stable  Post vital signs: Reviewed and stable  Last Vitals:  Filed Vitals:   02/15/15 0954  BP: 110/69  Pulse: 63  Temp: 36.9 C  Resp: 12    Complications: No apparent anesthesia complications

## 2015-02-15 NOTE — Op Note (Signed)
Preoperative diagnosis:  1. Wound infection   Postoperative diagnosis:  1. same   Procedure: 1. Debridement of wound and incision/drainage  Surgeon: Jennifer FatBenjamin W. Grisel Blumenstock, MD  Anesthesia: General  Complications: None  Intraoperative findings: sinus tracts excised in upper aspect of wound, cruciate incision over right lateral aspect of wound.  EBL: Minimal  Specimens: wound cultures sent  Indication: Jennifer Sanford is a 64 y.o. patient with persistent sinus drainage from her superior aspect of her incision and a right lateral wound abscess.  We have tried numerous times to debride this and pack it in clinic unsuccessfully. Decision was made to proceed to the operating room for sinus tract excision and debridement of the superior aspect of her wound and incision and drainage of the abscess on the right lateral aspect of her wound. After reviewing the management options for treatment, he elected to proceed with the above surgical procedure(s). We have discussed the potential benefits and risks of the procedure, side effects of the proposed treatment, the likelihood of the patient achieving the goals of the procedure, and any potential problems that might occur during the procedure or recuperation. Informed consent has been obtained.  Description of procedure:  The patient was taken to the operating room and general anesthesia was induced.  She was then prepped and draped in the routine sterile fashion. A timeout was held. Began the procedure by injecting 15 mL of Exparel.  I then made a cruciate incision over the right lateral aspect of the wound over the abscess and drained the purulent fluid. I did culture this and send it to micro-for speciation. I then expanded the cavity and broke all the loculations using Kelly's. We then irrigated this area with the pulse irrigator using a robotic solution for a total of 1 L.  I then connected the sinus tracts on this. Aspect of the lower midline incision  for a total of approximately 3 cm. I then opened the incision down through the scar and into the subcutaneous fat. I then excised the sinus drainage cavities. I then pulse irrigated this with Anti-biotics solution. Finally, I opened up the lower aspect of the patient's incision minimally and pulse irrigated this. There were no loculations or pockets noted in the lower aspect of the incision. All of these areas were then packed with a half-inch packing strip. I then used the remaining Exparel an additional 20 mL of half percent Marcaine in and around each of the wound beds. 4 x 4 dressing was then placed over top of the packing strip followed by ABDs. The patient was subsequently extubated and returned to PACU in stable condition.   Jennifer FatBenjamin W. Genifer Lazenby, M.D.

## 2015-02-15 NOTE — H&P (Deleted)
Reason For Visit second opinion for CaP   History of Present Illness 64M who is self-referred for a second opinion regarding a new diagnosis of prostate cancer.  PSA 4.0 on 10/11/58.  Prostate volume 72.9 cc  Prostate biopsy on 11/23/14:2/12 cores, left base and apex (70%) Gleason 4+5 = 9  Bone scan on 12/13/14: No evidence of bone metastases  CT abdomen/pelvis on 12/13/14: Negative for metastatic disease or lymphadenopathy.  IPSS: 4.0  Patient gets good erections   The patient denies any progression in his voiding symptoms. He denies any gross hematuria aside from that following his prostate biopsy. He denies any worsening bone pain. He denies a decrease in energy or any constitutional symptoms. He is otherwise healthy aside from mild hypertension. The patient has a significant family history of prostate cancer with his brother and cousin.   Past Medical History Problems  1. History of hypertension (Z86.79)  Surgical History Problems  1. History of Rectal Surgery  Current Meds 1. Losartan Potassium 100 MG Oral Tablet;  Therapy: (Recorded:18Apr2016) to Recorded 2. Omega-3-acid Ethyl Esters CAPS;  Therapy: (Recorded:18Apr2016) to Recorded  Allergies Medication  1. No Known Drug Allergies  Family History Problems  1. Family history of Death of family member : Mother, Father   Mother at age 44; strokeFather at age 65; unknown cause of death 2. Family history of diabetes mellitus (Z83.3) : Brother 3. Family history of hypertension (Z82.49) : Sister, Brother 4. Family history of prostate cancer (Z80.42) : Brother 5. Family history of renal failure (Z84.1) : Brother 6. Family history of stroke (Z82.3) : Mother  Social History Problems    Denied: History of Alcohol use   Denied: History of Caffeine use   Current every day smoker (F17.200)   1 ppd for 30 years   Divorced   Occupation   Custodian  Review of Systems Genitourinary, constitutional, skin, eye,  otolaryngeal, hematologic/lymphatic, cardiovascular, pulmonary, endocrine, musculoskeletal, gastrointestinal, neurological and psychiatric system(s) were reviewed and pertinent findings if present are noted and are otherwise negative.  Musculoskeletal: back pain.    Vitals Vital Signs [Data Includes: Last 1 Day]  Recorded: 18Apr2016 01:36PM  Height: 6 ft 1 in Weight: 260 lb  BMI Calculated: 34.3 BSA Calculated: 2.41 Blood Pressure: 114 / 71 Temperature: 97.7 F Heart Rate: 88  Physical Exam Constitutional: Well nourished and well developed . No acute distress.  ENT:. The ears and nose are normal in appearance.  Neck: The appearance of the neck is normal and no neck mass is present.  Pulmonary: No respiratory distress and normal respiratory rhythm and effort.  Cardiovascular: Heart rate and rhythm are normal . No peripheral edema.  Abdomen: The abdomen is soft and nontender. No masses are palpated. No CVA tenderness. No hernias are palpable. No hepatosplenomegaly noted.  Rectal: Rectal exam demonstrates normal sphincter tone, no tenderness and no masses. The prostate has no nodularity and is not tender. The left seminal vesicle is nonpalpable. The right seminal vesicle is nonpalpable. The perineum is normal on inspection.  Genitourinary: Examination of the penis demonstrates no discharge, no masses, no lesions and a normal meatus. The scrotum is without lesions. The right epididymis is palpably normal and non-tender. The left epididymis is palpably normal and non-tender. The right testis is non-tender and without masses. The left testis is non-tender and without masses.  Lymphatics: The femoral and inguinal nodes are not enlarged or tender.  Skin: Normal skin turgor, no visible rash and no visible skin lesions.  Neuro/Psych:. Mood   and affect are appropriate.    Results/Data Urine [Data Includes: Last 1 Day]   18Apr2016  COLOR YELLOW   APPEARANCE CLEAR   SPECIFIC GRAVITY 1.025   pH 6.0    GLUCOSE NEG mg/dL  BILIRUBIN NEG   KETONE NEG mg/dL  BLOOD NEG   PROTEIN TRACE mg/dL  UROBILINOGEN 0.2 mg/dL  NITRITE NEG   LEUKOCYTE ESTERASE NEG    I have read the patient's CT and bone scan reports. I have also reviewed the patient's pathology and 44 pages of other records.  The patient's CT scan shows a right adrenal adenoma based on its washout. There are no findings to suggest metastatic prostate cancer. There are no lymphadenopathy.  The patient's bone scan demonstrates arthritic changes in the spine but no evidence of metastatic or any metastases.   Assessment Assessed  1. Prostate cancer (C61)  Plan  Health Maintenance  1. UA With REFLEX; [Do Not Release]; Status:Complete;   Done: 18Apr2016 01:09PM Prostate cancer  2. Follow-up Schedule Surgery Office  Follow-up  Status: Hold For - Appointment   Requested for: 18Apr2016 3. Get Outside Records Office  Follow-up  Status: Hold For - Records  Requested for:  18Apr2016  PT/OT Referral Referral Referral Status: Hold For - PreCert,Date of Service Requested for: 26Apr2016 Ordered;  For: Prostate cancer; Ordered By: Herrick, Benjamin Performed:  Due: 26May2016 Marked Important; Last Updated By: Young, Wilda; 01/09/2015 4:24:54 PM URINE CULTURE; Status:In Progress - Specimen/Data Collected;  Done: 18Apr2016 Perform:[facility]; Due:20Apr2016; Marked Important; Last Updated By:Gunter, Karen; 01/09/2015 3:05:13 PM;Ordered; Today;  For:Prostate cancer; Ordered By:Herrick, Benjamin;   Discussion/Summary The patient has high-risk prostate cancer, albeit a small focus in 2/12 cores. His PSA is 4. His CT scan and bone scan were negative for metastatic disease. We discussed the treatment options for this patient, he was already advised of the options including radiation or surgery. The patient has opted for surgery at this time. As such, we discussed the surgery with the patient detail. We will plan to do a right nerve spare robotic-assisted  prostatectomy with bilateral inguinal nodes.  We discussed prostatectomy and specifically robotic prostatectomy with bilateral pelvic lymphadenectomy being the technique that I most commonly perform. I showed the patient on their abdomen the approximately 6 small incision (trocar) sites as well as presumed extraction sites with robotic approach as well as possible open incision sites should open conversion be necessary. We discussed peri-operative risks including bleeding, infection, deep vein thrombosis, pulmonary embolism, compartment syndrome, nuropathy / neuropraxia, heart attack, stroke, death, as well as long-term risks such as non-cure / need for additional therapy. We specifically addressed that the procedure would compromise urinary control leading to stress incontinence which typically resolves with time and pelvic rehabilitation (Kegel's, etc..), but can sometimes be permanent and require additional therapy including surgery. We also specifically addressed sexual sequellae including significant erectile dysfunction which typically partially resolves with time but can also be permanent and require additional therapy including surgery.     We discussed the typical hospital course including usual 1-2 night hospitalization, discharge with foley catheter in place usually for 1-2 weeks before voiding trial as well as usually 2 week recovery until able to perform most non-strenuous activity and 6 weeks until able to return to most jobs and more strenuous activity such as exercise.   

## 2015-02-15 NOTE — H&P (Signed)
Reason For Visit f/u for right ureteral reimplant   History of Present Illness 11F s/p right open ureteral reimplant on 09/13/14. Her post-op course was complicated by a pSBO from adhesions which was managed conservatively with bowel rest and TPN. Foley catheter was removed in the hospital after a negative cystogram. She was treated for a pseudomonas UTI which caused her to have a lot of gross hematuria. Her staples were removed in the hospital as well.  She has had wound infections that we've lanced and packed for several months. She was last seen one month ago and things were improving. She was packing her wounds and things were granulating.  She follows-up today to evaluate her kidney. Her stent was removed on 10/27/14. She has had no on-going right flank pain. She has not had any hematuria. Her labs were done last week.  Labs:  02/06/15: lytes wnl, BUN/Cr - 18/1.03  Intv: The patient has had continued drainage from her wound. This has been purulent. This pain is been largely localized to the lateral aspect of the wound Midway down on the right. She denies any fevers or chills. The drainage has been foul-smelling. He has also been drainage from the upper part of her incision. The patient had a renal ultrasound today. She denies any flank pain. She denies any voiding symptoms. She denies any gross hematuria.   Past Medical History Problems  1. History of Arthritis 2. History of hypercholesterolemia (Z86.39) 3. History of hypertension (Z86.79)  Surgical History Problems  1. History of Cystoscopy For Urethral Stricture 2. History of Cystoscopy With Insertion Of Ureteral Stent Right 3. History of Cystoscopy With Insertion Of Ureteral Stent Right 4. History of Cystoscopy With Insertion Of Ureteral Stent Right 5. History of Cystoscopy With Insertion Of Ureteral Stent Right 6. History of Cystourethroscopy W/ Ureteroscopy W/ Tx Of Ureteral Strict 7. History of Cystourethroscopy W/ Ureteroscopy  W/ Tx Of Ureteral Strict 8. History of Cystourethroscopy W/ Ureteroscopy W/ Tx Of Ureteral Strict 9. History of Cystourethroscopy With Treatment Of Ureteral Stricture 10. History of Hysterectomy 11. History of Incision And Drainage Postoperative Wound Infection, Complex 12. History of Knee Surgery 13. History of Knee Surgery 14. History of Surgical Lysis Of Intestinal Adhesions 15. History of Ureter Procedures 16. History of Ureteroneocystostomy  Current Meds 1. CeleXA 20 MG Oral Tablet;  Therapy: (Recorded:17Mar2016) to Recorded 2. Fenofibrate 160 MG Oral Tablet;  Therapy: (Recorded:17Mar2016) to Recorded 3. Multi-Vitamin TABS;  Therapy: (Recorded:08May2009) to Recorded 4. TraMADol HCl - 50 MG Oral Tablet; TAKE 1-2 TABLETS BY MOUTH EVERY 4-6 HOURS  AS NEEDED;  Therapy: 04Feb2016 to (Evaluate:25Feb2016); Last Rx:04Feb2016 Ordered 5. Triamterene-HCTZ 37.5-25 MG Oral Capsule;  Therapy: (Recorded:08May2009) to Recorded 6. Vitamin B12 TABS;  Therapy: (Recorded:17Mar2016) to Recorded 7. Vitamin B6 TABS;  Therapy: (Recorded:17Mar2016) to Recorded 8. Vitamin C TABS;  Therapy: (Recorded:17Mar2016) to Recorded  Allergies Medication  1. No Known Drug Allergies  Family History Problems  1. Family history of Arthritis 2. Family history of Family Health Status - Mother's Age : Mother   25 3. Family history of Family Health Status Number Of Children : Mother   3 daughters 4. Family history of Father Deceased At Age ____ : Mother   20 5. Family history of Hypertension : Mother  Social History Problems  1. Denied: History of Alcohol Use 2. Caffeine Use   2 a day 3. Marital History - Currently Married 4. Never A Smoker 5. Denied: History of Tobacco Use  Vitals Vital Signs [Data Includes:  Last 1 Day]  Recorded: 23May2016 03:24PM  Blood Pressure: 124 / 84 Temperature: 98.4 F Heart Rate: 69  Physical Exam The patient has 2 pinpoint areas in the superior part of her lower  midline incision that are draining. At the most inferior aspect of the incision there is an area that is larger and tunneled superiorly for approximately 5 mm. On the lateral aspect of the incision on the right-hand side midway down is a fluctuant area that is tender that is not draining but does show evidence of cellulitis.   Results/Data Urine [Data Includes: Last 1 Day]   23May2016  COLOR YELLOW   APPEARANCE CLEAR   SPECIFIC GRAVITY 1.020   pH 5.5   GLUCOSE NEG mg/dL  BILIRUBIN NEG   KETONE NEG mg/dL  BLOOD NEG   PROTEIN NEG mg/dL  UROBILINOGEN 0.2 mg/dL  NITRITE NEG   LEUKOCYTE ESTERASE NEG    Patient's urinalysis today is clear  Renal ultrasound to evaluate for hydronephrosis and parenchymal loss of her right kidney after her reimplant. The right kidney measures 10.4 cm in length with a cortical thickness of 1.1 cm. There is mild fullness of the renal pelvis. There is no severe hydronephrosis or significant parenchymal loss as compared to a renal ultrasound performed in February 2015. The left kidney measures 10.1 cm in length with a cortical thickness of 1.3 cm. The left kidney demonstrates no hydronephrosis, stones, or renal masses.   Assessment The patient's right ureteral re-implant appears to be functioning well with normal renal function and no evidence of hydronephrosis. Her renal parenchyma seems to be relatively preserved. She is asymptomatic in regards to her pain or other clinical findings. However, the patient does have a persistent sinus drainage of her wound in the superior aspect. She also has a fluid collection in the right side of the wound Midway down.   Plan Health Maintenance  1. UA With REFLEX; [Do Not Release]; Status:Complete;   Done: 23May2016 02:56PM  Discussion/Summary Our plan is to take the patient to the operating room and open these areas up more fully so that they can be packed and managed appropriately.  In regards to the patient's reimplant, I  would like to repeat a renal ultrasound and creatinine in 6 months.

## 2015-02-16 ENCOUNTER — Encounter (HOSPITAL_BASED_OUTPATIENT_CLINIC_OR_DEPARTMENT_OTHER): Payer: Self-pay | Admitting: Urology

## 2015-02-17 LAB — WOUND CULTURE
Culture: NO GROWTH
Culture: NO GROWTH

## 2015-02-17 NOTE — Anesthesia Postprocedure Evaluation (Signed)
  Anesthesia Post-op Note  Patient: Jennifer Sanford  Procedure(s) Performed: Procedure(s): INCISION AND DRAINAGE OF WOUND (N/A)  Patient Location: PACU  Anesthesia Type:General  Level of Consciousness: awake and alert   Airway and Oxygen Therapy: Patient Spontanous Breathing  Post-op Pain: none  Post-op Assessment: Post-op Vital signs reviewed  Post-op Vital Signs: Reviewed  Last Vitals:  Filed Vitals:   02/15/15 1330  BP: 141/93  Pulse: 59  Temp: 36 C  Resp: 16    Complications: No apparent anesthesia complications

## 2015-02-20 LAB — ANAEROBIC CULTURE

## 2015-09-22 ENCOUNTER — Emergency Department (HOSPITAL_COMMUNITY): Payer: PRIVATE HEALTH INSURANCE

## 2015-09-22 ENCOUNTER — Encounter (HOSPITAL_COMMUNITY): Payer: Self-pay | Admitting: Emergency Medicine

## 2015-09-22 ENCOUNTER — Emergency Department (HOSPITAL_COMMUNITY)
Admission: EM | Admit: 2015-09-22 | Discharge: 2015-09-22 | Disposition: A | Discharge status: Home / Self Care | Payer: PRIVATE HEALTH INSURANCE | Attending: Emergency Medicine | Admitting: Emergency Medicine

## 2015-09-22 ENCOUNTER — Emergency Department (HOSPITAL_COMMUNITY): Admission: EM | Admit: 2015-09-22 | Payer: 59 | Source: Home / Self Care

## 2015-09-22 DIAGNOSIS — F329 Major depressive disorder, single episode, unspecified: Secondary | ICD-10-CM | POA: Insufficient documentation

## 2015-09-22 DIAGNOSIS — K219 Gastro-esophageal reflux disease without esophagitis: Secondary | ICD-10-CM | POA: Insufficient documentation

## 2015-09-22 DIAGNOSIS — Z79899 Other long term (current) drug therapy: Secondary | ICD-10-CM | POA: Insufficient documentation

## 2015-09-22 DIAGNOSIS — S66911A Strain of unspecified muscle, fascia and tendon at wrist and hand level, right hand, initial encounter: Secondary | ICD-10-CM | POA: Diagnosis not present

## 2015-09-22 DIAGNOSIS — I10 Essential (primary) hypertension: Secondary | ICD-10-CM | POA: Diagnosis not present

## 2015-09-22 DIAGNOSIS — Z7982 Long term (current) use of aspirin: Secondary | ICD-10-CM | POA: Diagnosis not present

## 2015-09-22 DIAGNOSIS — Y998 Other external cause status: Secondary | ICD-10-CM | POA: Insufficient documentation

## 2015-09-22 DIAGNOSIS — Y9389 Activity, other specified: Secondary | ICD-10-CM | POA: Insufficient documentation

## 2015-09-22 DIAGNOSIS — E785 Hyperlipidemia, unspecified: Secondary | ICD-10-CM | POA: Diagnosis not present

## 2015-09-22 DIAGNOSIS — S6991XA Unspecified injury of right wrist, hand and finger(s), initial encounter: Secondary | ICD-10-CM | POA: Diagnosis present

## 2015-09-22 DIAGNOSIS — X58XXXA Exposure to other specified factors, initial encounter: Secondary | ICD-10-CM | POA: Insufficient documentation

## 2015-09-22 DIAGNOSIS — Y9289 Other specified places as the place of occurrence of the external cause: Secondary | ICD-10-CM | POA: Insufficient documentation

## 2015-09-22 NOTE — ED Notes (Signed)
Please see provider note for assessment.  

## 2015-09-22 NOTE — ED Notes (Signed)
Pt from home for eval of right wrist pain after a door was closed on wrist. No sensory loss, full rom but reports some pain. Pulses present. nad noted.

## 2015-09-22 NOTE — ED Provider Notes (Signed)
CSN: [id number]     Arrival date & time 09/22/15  1421 History  By signing my name below, I, Marica Otter, attest that this documentation has been prepared under the direction and in the presence of Teressa Lower, NP. Electronically Signed: Marica Otter, ED Scribe. 09/22/2015. 2:52 PM.   Chief Complaint  Patient presents with  . Wrist Pain   The history is provided by the patient. No language interpreter was used.   PCP: Clayborn Heron, MD HPI Comments: Jennifer Sanford is a 64 y.o. female, with PMHx noted below, who presents to the Emergency Department complaining of traumatic right wrist pain onset today after a door slammed closed on her right wrist. Pt denies any prior injury to the affected area. Associated Sx include mild numbness to right ring and middle finger. Pt denies any other Sx at this time.   Past Medical History  Diagnosis Date  . Depression   . GERD (gastroesophageal reflux disease)   . Complication of anesthesia     VERY CLAUSTROPHIC W/ MASK  . OA (osteoarthritis)   . Hyperlipidemia   . 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 Postoperative wound infection   . Hypertension     no meds s/p wt loss  . Hx of transfusion of packed red blood cells    Past Surgical History  Procedure Laterality Date  . Knee arthroscopy  left  2004//   bilateral 1980  . Breast reduction surgery Bilateral 04-12-2008  . Total knee arthroplasty Bilateral 03/17/2013    Procedure: TOTAL KNEE BILATERAL;  Surgeon: Loanne Drilling, MD;  Location: [facility];  Service: Orthopedics;  Laterality: Bilateral;  . Knee closed reduction Bilateral 06/02/2013    Procedure: CLOSED MANIPULATION BILATERAL KNEES;  Surgeon: Loanne Drilling, MD;  Location: [facility];  Service: Orthopedics;  Laterality: Bilateral;  . Tonsillectomy and adenoidectomy  as child  . Exploratory laparotomy w/ right ureter repair  2000  . Cysto/  right retrograde pyelogram/  balloon dilation  right ureter stricture/  stent placement  05-25-2010//   03-14-2009//   01-28-2009  . Cardiovascular stress test  12-26-2009    Normal nuclear study/  no ischemia/  ef 65%  . Vaginal hysterectomy  2000    w/ right salpingoophorectomy  . Cystoscopy with stent placement Right 02/18/2014    Procedure: CYSTOSCOPY WITH right STENT PLACEMENT;  Surgeon: Danae Chen, MD;  Location: [facility];  Service: Urology;  Laterality: Right;  . Ureteral reimplantion Right 09/13/2014    Procedure: OPEN RIGHT URETERAL REIMPLANT;  Surgeon: Crist Fat, MD;  Location: [facility];  Service: Urology;  Laterality: Right;  . Incision and drainage abscess N/A 11/28/2014    Procedure: INCISION AND DRAINAGE ABSCESS;  Surgeon: Crist Fat, MD;  [facility];  Service: Urology;  Laterality: N/A;  . Incision and drainage abscess N/A 02/15/2015    Procedure: INCISION AND DRAINAGE OF WOUND;  Surgeon: Crist Fat, MD;  Location: [facility];  Service: Urology;  Laterality: N/A;   Family History  Problem Relation Age of Onset  . Diabetes Mother   . Hypertension Father   . Lung cancer Father   . Cancer Other   . Heart attack Mother   . Dementia Mother   . Heart Problems Brother     CABG  . Bipolar disorder Sister    Social History  Substance Use Topics  . Smoking  status: Never Smoker   . Smokeless tobacco: Never Used  . Alcohol Use: No   OB History    No data available     Review of Systems  Musculoskeletal: Positive for arthralgias (right wrist pain).  Neurological: Positive for numbness (3rd and 4th digits ).  All other systems reviewed and are negative.  Allergies  Review of patient's allergies indicates no known allergies.  Home Medications   Prior to Admission medications   Medication Sig Start Date End Date Taking? Authorizing Provider  acetaminophen (TYLENOL) 500 MG tablet Take 1,000 mg by mouth every 6 (six) hours as needed for  moderate pain (pain).    Yes Historical Provider, MD  citalopram (CELEXA) 20 MG tablet Take 20 mg by mouth every morning.   Yes Historical Provider, MD  fenofibrate 160 MG tablet Take 160 mg by mouth every morning.   Yes Historical Provider, MD  pantoprazole (PROTONIX) 40 MG tablet Take 40 mg by mouth every morning.    Yes Historical Provider, MD  pyridOXINE (VITAMIN B-6) 100 MG tablet Take 100 mg by mouth every morning.   Yes Historical Provider, MD  vitamin B-12 (CYANOCOBALAMIN) 1000 MCG tablet Take 1,000 mcg by mouth every morning.    Yes Historical Provider, MD  vitamin C (ASCORBIC ACID) 500 MG tablet Take 500 mg by mouth daily.   Yes Historical Provider, MD  aspirin EC 81 MG tablet Take 81 mg by mouth every morning.     Historical Provider, MD  bisacodyl (DULCOLAX) 10 MG suppository Place 1 suppository (10 mg total) rectally daily as needed for moderate constipation. 09/30/14   Crist FatBenjamin W Herrick, MD  docusate sodium 100 MG CAPS Take 100 mg by mouth 2 (two) times daily. 09/14/14   Crist FatBenjamin W Herrick, MD  ondansetron (ZOFRAN) 4 MG tablet Take 1 tablet (4 mg total) by mouth every 8 (eight) hours as needed for nausea or vomiting. 09/30/14   Crist FatBenjamin W Herrick, MD  oxyCODONE (OXY IR/ROXICODONE) 5 MG immediate release tablet Take 1-2 tablets (5-10 mg total) by mouth every 4 (four) hours as needed for severe pain. 02/15/15   Crist FatBenjamin W Herrick, MD  phentermine (ADIPEX-P) 37.5 MG tablet Take 18.75 mg by mouth daily before breakfast.    Historical Provider, MD  traMADol (ULTRAM) 50 MG tablet Take 1 tablet (50 mg total) by mouth every 6 (six) hours as needed for moderate pain. 02/15/15   Crist FatBenjamin W Herrick, MD  triamterene-hydrochlorothiazide [facility](MAXZIDE-25) 37.5-25 MG per tablet Take 0.5 tablets by mouth every morning. 04/02/13   Mcarthur Rossettianiel J Angiulli, PA-C   Triage Vitals: BP 118/72 mmHg  Pulse 69  Temp(Src) 98.1 F (36.7 C) (Oral)  Resp 17  SpO2 100% Physical Exam  Constitutional: She is oriented to person,  place, and time. She appears well-developed and well-nourished.  HENT:  Head: Normocephalic.  Eyes: EOM are normal.  Neck: Normal range of motion.  Pulmonary/Chest: Effort normal.  Abdominal: She exhibits no distension.  Musculoskeletal: Normal range of motion.  Tender in the palmar aspect of the right wrist. Full rom. Equal grip strength  Neurological: She is alert and oriented to person, place, and time.  Psychiatric: She has a normal mood and affect.  Nursing note and vitals reviewed.   ED Course  Procedures (including critical care time) DIAGNOSTIC STUDIES: Oxygen Saturation is 100% on ra, nl by my interpretation.    COORDINATION OF CARE: 2:45 PM: Discussed treatment plan which includes imaging with pt at bedside; patient verbalizes understanding and agrees with  treatment plan.  Imaging Review Dg Wrist Complete Right  09/22/2015  CLINICAL DATA:  Right wrist pain post injury, numbness in fingers and radiating in pain to fourth and fifth fingers. EXAM: RIGHT WRIST - COMPLETE 3+ VIEW COMPARISON:  None. FINDINGS: Scattered lucencies with thin the carpal bones suggests chronic erosions suspicious for an underlying inflammatory arthritis. No osteophytes or other secondary signs of advanced degenerative osteoarthritis. Overall osseous alignment is normal. No fracture line or displaced fracture fragment identified. Soft tissues about the right wrist are unremarkable. IMPRESSION: Scattered focal lucencies involving the majority of the carpal bones, best seen within the scaphoid, lunate and capitate bones. This are most likely chronic erosions secondary to an underlying inflammatory arthritis. Consider rheumatology consult for further workup considerations. No acute findings.  No fracture or dislocation. Electronically Signed   By: Bary Richard M.D.   On: 09/22/2015 15:06   I have personally reviewed and evaluated these images as part of my medical decision-making.  MDM   Final diagnoses:   Wrist strain, right, initial encounter    Discussed imaging with pt and the need for follow up. Pt verbalized understanding. Placed in velcro splint for comfort   I personally performed the services described in this documentation, which was scribed in my presence. The recorded information has been reviewed and is accurate.    Teressa Lower, NP 09/22/15 1530  Laurence Spates, MD 09/23/15 [phone]

## 2015-09-26 ENCOUNTER — Other Ambulatory Visit: Payer: Self-pay

## 2015-09-26 DIAGNOSIS — Z1231 Encounter for screening mammogram for malignant neoplasm of breast: Secondary | ICD-10-CM

## 2015-10-06 ENCOUNTER — Ambulatory Visit: Payer: Self-pay

## 2015-10-17 ENCOUNTER — Ambulatory Visit: Payer: Self-pay

## 2015-10-19 ENCOUNTER — Ambulatory Visit
Admission: RE | Admit: 2015-10-19 | Discharge: 2015-10-19 | Disposition: A | Discharge status: Home / Self Care | Payer: 59 | Source: Ambulatory Visit

## 2015-10-19 DIAGNOSIS — Z1231 Encounter for screening mammogram for malignant neoplasm of breast: Secondary | ICD-10-CM | POA: Diagnosis not present

## 2015-10-26 ENCOUNTER — Encounter: Payer: Self-pay | Admitting: Skilled Nursing Facility1

## 2015-10-26 ENCOUNTER — Encounter: Payer: 59 | Attending: Family Medicine | Admitting: Skilled Nursing Facility1

## 2015-10-26 VITALS — Ht 70.0 in | Wt 216.0 lb

## 2015-10-26 DIAGNOSIS — E639 Nutritional deficiency, unspecified: Secondary | ICD-10-CM

## 2015-10-26 DIAGNOSIS — Z713 Dietary counseling and surveillance: Secondary | ICD-10-CM | POA: Diagnosis not present

## 2015-10-26 NOTE — Progress Notes (Signed)
  Medical Nutrition Therapy:  Appt start time: 1600 end time:  1700.   Assessment:  Primary concerns today: [place] employee. Pt states she wants to eat better. Pt states she is still hungry after she eats and fears she eats too many carbohydrates. Pt states she was 198 on Saturday and now 216 pounds-gaining 18 pounds in 7 days. Pt states her sleep is excellent. Pt states her bowel movements have been good. Pts diet does not require extensive modification. Pt states she does not want to change her physical activity routine or her 2% milk consumption. Pt states she has an upcoming appointment with her physician to discuss her fluid retention.   Preferred Learning Style:   Auditory  Learning Readiness:   Change in progress   MEDICATIONS: See List   DIETARY INTAKE:  Usual eating pattern includes 3 meals and 2-3 snacks per day.  Everyday foods include none stated.  Avoided foods include none stated.    24-hr recall:  B ( AM): egg omelet: spinach, peppers, cheese and premier protein shake----wheat chex and omelet  Snk ( AM): grapes L ( PM): cabbage, black bean soup and mac N' cheese Snk ( PM): fiber one brownies D ( PM): zucchini and onion, red beans, grilled salmon and half sweet potato, corn bread Snk ( PM): brownies and milk Beverages: water, 2% milk  Usual physical activity: walk 30 minutes 3 times a week  Estimated energy needs: 1600 calories 180 g carbohydrates 120 g protein 44 g fat  Progress Towards Goal(s):  In progress.   Nutritional Diagnosis:  NB-1.1 Food and nutrition-related knowledge deficit As related to no prior nutrition education from a nutrition professional.  As evidenced by her beleif she needed a protein supplement/she was severly overconsuming carbohydrates.    Intervention:  Nutrition counseling for overweight. Dietitian educated the pt on carbohydrates, proper macronutrient distribution, adequate physical activity, and assured her she had a well  rounded healthy diet but she needed to avoid meal skipping.   Teaching Method Utilized:  Visual Auditory  Handouts given during visit include:  Snack sheet  MyPlate  Barriers to learning/adherence to lifestyle change: none identified  Demonstrated degree of understanding via:  Teach Back   Monitoring/Evaluation:  Dietary intake, exercise, fluid retention, and body weight prn.

## 2015-10-30 DIAGNOSIS — F411 Generalized anxiety disorder: Secondary | ICD-10-CM | POA: Diagnosis not present

## 2015-10-30 DIAGNOSIS — I10 Essential (primary) hypertension: Secondary | ICD-10-CM | POA: Diagnosis not present

## 2015-10-30 MED FILL — HYDROCHLOROTHIAZIDE 25 MG T: 25 | 90 days supply | Qty: 90 | Fill #0

## 2015-11-21 DIAGNOSIS — H5213 Myopia, bilateral: Secondary | ICD-10-CM | POA: Diagnosis not present

## 2015-12-12 MED FILL — PANTOPRAZOLE SOD DR 40 MG T: 40 | 90 days supply | Qty: 90 | Fill #1

## 2015-12-15 MED FILL — CITALOPRAM HBR 20 MG TABLET: 20 | 90 days supply | Qty: 90 | Fill #1

## 2015-12-27 ENCOUNTER — Ambulatory Visit: Payer: Self-pay | Admitting: Internal Medicine

## 2016-01-15 DIAGNOSIS — L509 Urticaria, unspecified: Secondary | ICD-10-CM | POA: Diagnosis not present

## 2016-01-29 ENCOUNTER — Other Ambulatory Visit (INDEPENDENT_AMBULATORY_CARE_PROVIDER_SITE_OTHER): Payer: 59

## 2016-01-29 ENCOUNTER — Ambulatory Visit (INDEPENDENT_AMBULATORY_CARE_PROVIDER_SITE_OTHER): Payer: 59 | Admitting: Internal Medicine

## 2016-01-29 ENCOUNTER — Encounter: Payer: Self-pay | Admitting: Internal Medicine

## 2016-01-29 VITALS — BP 104/68 | HR 64 | Temp 97.9°F | Resp 16 | Wt 211.0 lb

## 2016-01-29 DIAGNOSIS — Z1159 Encounter for screening for other viral diseases: Secondary | ICD-10-CM | POA: Diagnosis not present

## 2016-01-29 DIAGNOSIS — R413 Other amnesia: Secondary | ICD-10-CM | POA: Diagnosis not present

## 2016-01-29 DIAGNOSIS — F329 Major depressive disorder, single episode, unspecified: Secondary | ICD-10-CM

## 2016-01-29 DIAGNOSIS — Z114 Encounter for screening for human immunodeficiency virus [HIV]: Secondary | ICD-10-CM | POA: Diagnosis not present

## 2016-01-29 DIAGNOSIS — I10 Essential (primary) hypertension: Secondary | ICD-10-CM | POA: Diagnosis not present

## 2016-01-29 DIAGNOSIS — K219 Gastro-esophageal reflux disease without esophagitis: Secondary | ICD-10-CM | POA: Insufficient documentation

## 2016-01-29 DIAGNOSIS — F32A Depression, unspecified: Secondary | ICD-10-CM | POA: Insufficient documentation

## 2016-01-29 DIAGNOSIS — F419 Anxiety disorder, unspecified: Secondary | ICD-10-CM | POA: Insufficient documentation

## 2016-01-29 LAB — COMPREHENSIVE METABOLIC PANEL
ALT: 14 U/L (ref 0–35)
AST: 19 U/L (ref 0–37)
Albumin: 4.2 g/dL (ref 3.5–5.2)
Alkaline Phosphatase: 83 U/L (ref 39–117)
BUN: 33 mg/dL — ABNORMAL HIGH (ref 6–23)
CO2: 30 mEq/L (ref 19–32)
Calcium: 9.6 mg/dL (ref 8.4–10.5)
Chloride: 103 mEq/L (ref 96–112)
Creatinine, Ser: 1.06 mg/dL (ref 0.40–1.20)
GFR: 67.42 mL/min (ref >60.00)
Glucose, Bld: 96 mg/dL (ref 70–99)
Potassium: 4.2 mEq/L (ref 3.5–5.1)
Sodium: 140 mEq/L (ref 135–145)
Total Bilirubin: 0.3 mg/dL (ref 0.2–1.2)
Total Protein: 7.6 g/dL (ref 6.0–8.3)

## 2016-01-29 LAB — CBC WITH DIFFERENTIAL/PLATELET
Basophils Absolute: 0 10*3/uL (ref 0.0–0.1)
Basophils Relative: 0.2 % (ref 0.0–3.0)
Eosinophils Absolute: 0.2 10*3/uL (ref 0.0–0.7)
Eosinophils Relative: 2.3 % (ref 0.0–5.0)
HCT: 38.4 % (ref 36.0–46.0)
Hemoglobin: 12.7 g/dL (ref 12.0–15.0)
Lymphocytes Relative: 40.1 % (ref 12.0–46.0)
Lymphs Abs: 4 10*3/uL (ref 0.7–4.0)
MCHC: 33 g/dL (ref 30.0–36.0)
MCV: 86.7 fl (ref 78.0–100.0)
Monocytes Absolute: 0.8 10*3/uL (ref 0.1–1.0)
Monocytes Relative: 7.6 % (ref 3.0–12.0)
Neutro Abs: 5 10*3/uL (ref 1.4–7.7)
Neutrophils Relative %: 49.8 % (ref 43.0–77.0)
Platelets: 269 10*3/uL (ref 150.0–400.0)
RBC: 4.43 Mil/uL (ref 3.87–5.11)
RDW: 15.2 % (ref 11.5–15.5)
WBC: 10 10*3/uL (ref 4.0–10.5)

## 2016-01-29 LAB — TSH: TSH: 1.27 u[IU]/mL (ref 0.35–4.50)

## 2016-01-29 LAB — HIV ANTIBODY (ROUTINE TESTING W REFLEX): HIV 1&2 Ab, 4th Generation: NONREACTIVE

## 2016-01-29 MED ORDER — TRIAMTERENE-HCTZ 37.5-25 MG PO TABS: 0.5000 | ORAL_TABLET | Freq: Every day | ORAL | Status: DC

## 2016-01-29 MED ORDER — PANTOPRAZOLE SODIUM 20 MG PO TBEC: 20.0000 mg | DELAYED_RELEASE_TABLET | Freq: Every day | ORAL | Status: DC

## 2016-01-29 MED ORDER — CITALOPRAM HYDROBROMIDE 20 MG PO TABS: 20.0000 mg | ORAL_TABLET | Freq: Every morning | ORAL | Status: DC

## 2016-01-29 MED ORDER — VITAMIN D3 25 MCG (1000 UT) PO CAPS: ORAL_CAPSULE | ORAL | Status: AC

## 2016-01-29 MED ORDER — B COMPLEX 50 PO TABS: ORAL_TABLET | ORAL | Status: AC

## 2016-01-29 NOTE — Patient Instructions (Signed)
  Test(s) ordered today. Your results will be released to MyChart (or called to you) after review, usually within 72hours after test completion. If any changes need to be made, you will be notified at that same time.  All other Health Maintenance issues reviewed.   All recommended immunizations and age-appropriate screenings are up-to-date or discussed.  No immunizations administered today.   Medications reviewed and updated.  No changes recommended at this time.  Your prescription(s) have been submitted to your pharmacy. Please take as directed and contact our office if you believe you are having problem(s) with the medication(s).  A referral was ordered for neurology.  Please followup in one year

## 2016-01-29 NOTE — Assessment & Plan Note (Signed)
Likely within normal limits, multitasking Check basic labs Continue regular physical and mental exercises Continue vitamin supplementation She is concerned and would like a referral to neuro -- referred today She will continue to monitor

## 2016-01-29 NOTE — Assessment & Plan Note (Signed)
Controlled Continue current dose of celexa

## 2016-01-29 NOTE — Progress Notes (Signed)
Subjective:    Patient ID: Jennifer MartinetSusan N Sanford, female    DOB: 09/22/1951, 65 y.o.   MRN: [id number]  HPI She is here to establish with a new pcp.    Hypertension: She is taking her medication daily. She was having high blood pressure, but her medication was adjusted and it has been better controlled.  She was taking the BP med in the morning, but switched it to nighttime because her BP was elevated in the morning and low during the day.  After switching the time she takes her medication her BP has been better controlled.  She does not mind getting up at night to urinate - she goes right back to sleep.  She is compliant with a low sodium diet.  She denies chest pain, palpitations, edema, shortness of breath and regular headaches. She is exercising regularly.  She does monitor her blood pressure at home.    Memory concerns:  She is forgetful.  She has noticed this the past couple of months.  She has called someone at work twice in one day for the same thing.  Her daughter sent her a text regarding something and two days later she sent her a text with a questions that her daughter answered in her first text.  She has difficulty with name recall.  She is tyring to focus more and doing a lot of sticky notes.  She has started to do cross word puzzles.  She is a Investment banker, operationalnurse care manager at [employer] and her job does have a lot of multitasking.  She denies anxiety and depression. She denies any changes in her life.  Her mother had dementia and she is concerned.    GERD:  She is taking her medication daily as prescribed.  She denies any GERD symptoms and feels her GERD is well controlled.  She has not tried to come off the medication.   Depression, anxiety: She is taking her medication daily as prescribed. She denies any side effects from the medication. She feels her depression and anxiety are well controlled and she is happy with her current dose of medication.      Medications and allergies reviewed with patient and  updated if appropriate.  Patient Active Problem List   Diagnosis Date Noted  . 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 S/P ureteral reimplantation 09/13/2014  . Postoperative stiffness of total knee replacement (HCC) 06/01/2013  . S/P bilateral unicompartmental knee replacement 03/22/2013  . Postop Transfusion 03/21/2013  . Postoperative anemia due to acute blood loss 03/18/2013  . Hyponatremia 03/18/2013  . OA (osteoarthritis) of knee 03/17/2013  . TENOSYNOVITIS OF FOOT AND ANKLE 08/28/2009  . FOOT PAIN, RIGHT 08/28/2009  . PES PLANUS 08/28/2009  . ABNORMALITY OF GAIT 08/28/2009    Current Outpatient Prescriptions on File Prior to Visit  Medication Sig Dispense Refill  . acetaminophen (TYLENOL) 500 MG tablet Take 1,000 mg by mouth every 6 (six) hours as needed for moderate pain (pain).     . citalopram (CELEXA) 20 MG tablet Take 20 mg by mouth every morning.    . fenofibrate 160 MG tablet Take 160 mg by mouth every morning.    . pantoprazole (PROTONIX) 40 MG tablet Take 40 mg by mouth every morning.     . pyridOXINE (VITAMIN B-6) 100 MG tablet Take 100 mg by mouth every morning.    . triamterene-hydrochlorothiazide (MAXZIDE-25) 37.5-25 MG per tablet Take 0.5 tablets by mouth every morning. (Patient taking differently: Take 0.5 tablets by  mouth at bedtime. ) 30 tablet 1  . vitamin B-12 (CYANOCOBALAMIN) 1000 MCG tablet Take 1,000 mcg by mouth every morning.     . vitamin C (ASCORBIC ACID) 500 MG tablet Take 500 mg by mouth daily.     No current facility-administered medications on file prior to visit.    Past Medical History  Diagnosis Date  . Depression   . GERD (gastroesophageal reflux disease)   . Complication of anesthesia     VERY CLAUSTROPHIC W/ MASK  . OA (osteoarthritis)   . Hyperlipidemia   . 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 Postoperative wound infection   . Hypertension     no meds s/p wt loss  . Hx of transfusion  of packed red blood cells     Past Surgical History  Procedure Laterality Date  . Knee arthroscopy  left  2004//   bilateral 1980  . Breast reduction surgery Bilateral 04-12-2008  . Total knee arthroplasty Bilateral 03/17/2013    Procedure: TOTAL KNEE BILATERAL;  Surgeon: Loanne Drilling, MD;  Location: [facility];  Service: Orthopedics;  Laterality: Bilateral;  . Knee closed reduction Bilateral 06/02/2013    Procedure: CLOSED MANIPULATION BILATERAL KNEES;  Surgeon: Loanne Drilling, MD;  Location: [facility];  Service: Orthopedics;  Laterality: Bilateral;  . Tonsillectomy and adenoidectomy  as child  . Exploratory laparotomy w/ right ureter repair  2000  . Cysto/  right retrograde pyelogram/  balloon dilation right ureter stricture/  stent placement  05-25-2010//   03-14-2009//   01-28-2009  . Cardiovascular stress test  12-26-2009    Normal nuclear study/  no ischemia/  ef 65%  . Vaginal hysterectomy  2000    w/ right salpingoophorectomy  . Cystoscopy with stent placement Right 02/18/2014    Procedure: CYSTOSCOPY WITH right STENT PLACEMENT;  Surgeon: Danae Chen, MD;  Location: [facility];  Service: Urology;  Laterality: Right;  . Ureteral reimplantion Right 09/13/2014    Procedure: OPEN RIGHT URETERAL REIMPLANT;  Surgeon: Crist Fat, MD;  Location: [facility];  Service: Urology;  Laterality: Right;  . Incision and drainage abscess N/A 11/28/2014    Procedure: INCISION AND DRAINAGE ABSCESS;  Surgeon: Crist Fat, MD;  Location: [facility];  Service: Urology;  Laterality: N/A;  . Incision and drainage abscess N/A 02/15/2015    Procedure: INCISION AND DRAINAGE OF WOUND;  Surgeon: Crist Fat, MD;  Location: [facility];  Service: Urology;  Laterality: N/A;    Social History   Social History  . Marital Status: Divorced    Spouse Name: N/A  . Number of Children: N/A  . Years of Education: N/A   Social History Main Topics  . Smoking  status: Never Smoker   . Smokeless tobacco: Never Used  . Alcohol Use: No  . Drug Use: No  . Sexual Activity: Not on file   Other Topics Concern  . Not on file   Social History Narrative    Family History  Problem Relation Age of Onset  . Diabetes Mother   . Hypertension Father   . Lung cancer Father   . Cancer Other   . Heart attack Mother   . Dementia Mother   . Heart Problems Brother     CABG  . Bipolar disorder Sister     Review of Systems  Constitutional: Negative for fever, chills, appetite change, fatigue and unexpected weight change.  Eyes: Negative for visual disturbance.  Respiratory: Negative for cough, shortness of breath and wheezing.   Cardiovascular: Positive for leg swelling (controlled, wears compression stockings sometimes). Negative for chest pain and palpitations.  Gastrointestinal: Negative for nausea and abdominal pain.       No gerd  Genitourinary: Negative for dysuria and hematuria.  Musculoskeletal: Positive for arthralgias (knee pain on occasional). Negative for myalgias and back pain.  Neurological: Negative for dizziness, light-headedness and headaches.  Psychiatric/Behavioral: Negative for dysphoric mood. The patient is not nervous/anxious.        Objective:   Filed Vitals:   01/29/16 1502  BP: 104/68  Pulse: 64  Temp: 97.9 F (36.6 C)  Resp: 16   Filed Weights   01/29/16 1502  Weight: 211 lb (95.709 kg)   Body mass index is 30.28 kg/(m^2).   Physical Exam Constitutional: She appears well-developed and well-nourished. No distress.  HENT:  Head: Normocephalic and atraumatic.  Mouth/Throat: Oropharynx is clear and moist.  Eyes: Conjunctivae and EOM are normal.  Neck: Neck supple. No tracheal deviation present. No thyromegaly present.  No carotid bruit  Cardiovascular: Normal rate, regular rhythm and normal heart sounds.   No murmur heard.  No edema. Pulmonary/Chest: Effort normal and breath sounds normal. No respiratory  distress. She has no wheezes. She has no rales.  Abdominal: Soft. She exhibits no distension. There is no tenderness.  Lymphadenopathy: She has no cervical adenopathy.  Skin: Skin is warm and dry. She is not diaphoretic.  Psychiatric: She has a normal mood and affect. Her behavior is normal.       Assessment & Plan:   See Problem List for Assessment and Plan of chronic medical problems.   F/u annually

## 2016-01-29 NOTE — Assessment & Plan Note (Signed)
Controlled on protonix 40 mg daily Will try decreasing to protonix 20 mg daily Call with questions

## 2016-01-29 NOTE — Assessment & Plan Note (Signed)
Controlled, stable Continue current dose of celexa 

## 2016-01-29 NOTE — Assessment & Plan Note (Addendum)
BP well controlled Current regimen effective and well tolerated Continue current medications at current doses cmp  

## 2016-01-29 NOTE — Progress Notes (Signed)
Pre visit review using our clinic review tool, if applicable. No additional management support is needed unless otherwise documented below in the visit note. 

## 2016-01-30 ENCOUNTER — Encounter: Payer: Self-pay | Admitting: Emergency Medicine

## 2016-01-30 LAB — HEPATITIS C ANTIBODY: HCV Ab: NEGATIVE

## 2016-02-12 DIAGNOSIS — N135 Crossing vessel and stricture of ureter without hydronephrosis: Secondary | ICD-10-CM | POA: Diagnosis not present

## 2016-02-12 DIAGNOSIS — N1330 Unspecified hydronephrosis: Secondary | ICD-10-CM | POA: Diagnosis not present

## 2016-02-21 MED FILL — TRIAMTERENE/HCTZ 37.5/25 CP: 37.5-25 | 90 days supply | Qty: 90 | Fill #1

## 2016-02-21 MED FILL — FENOFIBRATE 160 MG TABLET: 160 | 90 days supply | Qty: 90 | Fill #1

## 2016-02-21 MED FILL — CITALOPRAM HBR 20 MG TABLET: 20 | 90 days supply | Qty: 90 | Fill #0

## 2016-02-21 MED FILL — PANTOPRAZOLE SOD DR 20 MG T: 20 | 90 days supply | Qty: 90 | Fill #0

## 2016-03-01 ENCOUNTER — Ambulatory Visit: Payer: Self-pay | Admitting: Neurology

## 2016-03-18 ENCOUNTER — Other Ambulatory Visit (HOSPITAL_COMMUNITY)
Admission: RE | Admit: 2016-03-18 | Discharge: 2016-03-18 | Disposition: A | Discharge status: Home / Self Care | Payer: 59 | Source: Ambulatory Visit | Attending: Family Medicine | Admitting: Family Medicine

## 2016-03-18 ENCOUNTER — Ambulatory Visit (INDEPENDENT_AMBULATORY_CARE_PROVIDER_SITE_OTHER): Payer: 59 | Admitting: Family Medicine

## 2016-03-18 ENCOUNTER — Encounter: Payer: Self-pay | Admitting: Family Medicine

## 2016-03-18 VITALS — BP 100/70 | HR 93 | Temp 98.2°F | Ht 70.0 in | Wt 211.8 lb

## 2016-03-18 DIAGNOSIS — N760 Acute vaginitis: Secondary | ICD-10-CM

## 2016-03-18 DIAGNOSIS — Z113 Encounter for screening for infections with a predominantly sexual mode of transmission: Secondary | ICD-10-CM | POA: Insufficient documentation

## 2016-03-18 MED ORDER — FLUCONAZOLE 150 MG PO TABS: 150.0000 mg | ORAL_TABLET | Freq: Once | ORAL | Status: DC

## 2016-03-18 MED FILL — FLUCONAZOLE 150 MG TABLET: 150 | 2 days supply | Qty: 2 | Fill #0

## 2016-03-18 NOTE — Progress Notes (Signed)
Pre visit review using our clinic review tool, if applicable. No additional management support is needed unless otherwise documented below in the visit note. 

## 2016-03-18 NOTE — Progress Notes (Signed)
HPI:  Jennifer Sanford is a pleasant 65 year old here for an acute visit for pruritus of the vulvovaginal region and gluteal cleft. This started 2 days ago. She reports she has scratched so much that she has bled on occasion. Reports a history of hysterectomy for fibroids. Denies vaginal discharge, dysuria, ulcers, fevers, malaise or concern for sexually transmitted infections. Reports she has not been sexually active in a very long time. However, she does want testing for GC/chlamydia, trichomonas, [place] and BV.  ROS: See pertinent positives and negatives per HPI.  Past Medical History  Diagnosis Date  . Depression   . GERD (gastroesophageal reflux disease)   . Complication of anesthesia     VERY CLAUSTROPHIC W/ MASK  . OA (osteoarthritis)   . Hyperlipidemia   . 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 Postoperative wound infection   . Hypertension     no meds s/p wt loss  . Hx of transfusion of packed red blood cells     Past Surgical History  Procedure Laterality Date  . Knee arthroscopy  left  2004//   bilateral 1980  . Breast reduction surgery Bilateral 04-12-2008  . Total knee arthroplasty Bilateral 03/17/2013    Procedure: TOTAL KNEE BILATERAL;  Surgeon: Loanne DrillingFrank V Aluisio, MD;  Location: [facility];  Service: Orthopedics;  Laterality: Bilateral;  . Knee closed reduction Bilateral 06/02/2013    Procedure: CLOSED MANIPULATION BILATERAL KNEES;  Surgeon: Loanne DrillingFrank V Aluisio, MD;  Location: [facility];  Service: Orthopedics;  Laterality: Bilateral;  . Tonsillectomy and adenoidectomy  as child  . Exploratory laparotomy w/ right ureter repair  2000  . Cysto/  right retrograde pyelogram/  balloon dilation right ureter stricture/  stent placement  05-25-2010//   03-14-2009//   01-28-2009  . Cardiovascular stress test  12-26-2009    Normal nuclear study/  no ischemia/  ef 65%  . Vaginal hysterectomy  2000    w/ right salpingoophorectomy  . Cystoscopy with  stent placement Right 02/18/2014    Procedure: CYSTOSCOPY WITH right STENT PLACEMENT;  Surgeon: Danae ChenMarc H Nesi, MD;  [facility] [place];  Service: Urology;  Laterality: Right;  . Ureteral reimplantion Right 09/13/2014    Procedure: OPEN RIGHT URETERAL REIMPLANT;  Surgeon: Crist FatBenjamin W Herrick, MD;  Location: [facility];  Service: Urology;  Laterality: Right;  . Incision and drainage abscess N/A 11/28/2014    Procedure: INCISION AND DRAINAGE ABSCESS;  Surgeon: Crist FatBenjamin W Herrick, MD;  [facility] [place];  Service: Urology;  Laterality: N/A;  . Incision and drainage abscess N/A 02/15/2015    Procedure: INCISION AND DRAINAGE OF WOUND;  Surgeon: Crist FatBenjamin W Herrick, MD;  [facility] [place];  Service: Urology;  Laterality: N/A;    Family History  Problem Relation Age of Onset  . Diabetes Mother   . Hypertension Father   . Lung cancer Father   . Cancer Other   . Heart attack Mother   . Dementia Mother   . Heart Problems Brother     CABG  . Bipolar disorder Sister     Social History   Social History  . Marital Status: Divorced    Spouse Name: N/A  . Number of Children: N/A  . Years of Education: N/A   Social History Main Topics  . Smoking status: Never Smoker   . Smokeless tobacco: Never Used  . Alcohol Use: No  . Drug Use: No  . Sexual Activity:  Not Asked   Other Topics Concern  . None   Social History Narrative   [facility] [employer] [facility]     Current outpatient prescriptions:  .  B Complex Vitamins (B COMPLEX 50) TABS, daily, Disp: , Rfl: 0 .  Cholecalciferol (VITAMIN D3) 1000 units CAPS, daily, Disp: 60 capsule, Rfl:  .  citalopram (CELEXA) 20 MG tablet, Take 1 tablet (20 mg total) by mouth every morning., Disp: 90 tablet, Rfl: 1 .  fenofibrate 160 MG tablet, Take 160 mg by mouth every morning., Disp: , Rfl:  .  pantoprazole (PROTONIX) 20 MG tablet, Take 1 tablet (20 mg total) by mouth daily., Disp: 90 tablet, Rfl: 3 .  pyridOXINE  (VITAMIN B-6) 100 MG tablet, Take 100 mg by mouth every morning., Disp: , Rfl:  .  triamterene-hydrochlorothiazide (MAXZIDE-25) 37.5-25 MG tablet, Take 0.5 tablets by mouth at bedtime., Disp: 45 tablet, Rfl: 3 .  vitamin C (ASCORBIC ACID) 500 MG tablet, Take 500 mg by mouth daily., Disp: , Rfl:  .  fluconazole (DIFLUCAN) 150 MG tablet, Take 1 tablet (150 mg total) by mouth once., Disp: 2 tablet, Rfl: 0  EXAM:  Filed Vitals:   03/18/16 1640  BP: 100/70  Pulse: 93  Temp: 98.2 F (36.8 C)    Body mass index is 30.39 kg/(m^2).  GENERAL: vitals reviewed and listed above, alert, oriented, appears well hydrated and in no acute distress  HEENT: atraumatic, conjunttiva clear, no obvious abnormalities on inspection of external nose and ears  NECK: no obvious masses on inspection  GU: Some irritation of the skin in the vulvovaginal area and the gluteal cleft, small excoriation right labia minora, small amount of light brown vaginal discharge, otherwise normal exam.   MS: moves all extremities without noticeable abnormality  PSYCH: pleasant and cooperative, no obvious depression or anxiety  ASSESSMENT AND PLAN:  Discussed the following assessment and plan:  Vaginitis and vulvovaginitis - Plan: Cervicovaginal ancillary only  -Testing per orders -Suspect yeast, advised over-the-counter treatment, she reports she gets yeast every year and requires pills as the over-the-counter treatments do not work. She requests Diflucan. -Diflucan once, advised topical yeast cream and hydrocortisone cream once daily for 1 week -Patient advised to return or notify a doctor immediately if symptoms worsen or persist or new concerns arise.  Patient Instructions  Apply the yeast treatment (clotirmazole) cream and hydrocortisone cream once daily to the itchy skin for 1 week  Take the yeast pill once. May repeat in 1 week.  Follow up with your doctor if persistent or worsening symptoms.     Kriste BasqueKIM, Danaja Lasota  R.

## 2016-03-18 NOTE — Patient Instructions (Signed)
Apply the yeast treatment (clotirmazole) cream and hydrocortisone cream once daily to the itchy skin for 1 week  Take the yeast pill once. May repeat in 1 week.  Follow up with your doctor if persistent or worsening symptoms.

## 2016-03-20 LAB — CERVICOVAGINAL ANCILLARY ONLY
Chlamydia: NEGATIVE
Neisseria Gonorrhea: NEGATIVE
Trichomonas: NEGATIVE

## 2016-03-21 LAB — CERVICOVAGINAL ANCILLARY ONLY
Bacterial vaginitis: NEGATIVE
Candida vaginitis: NEGATIVE

## 2016-04-11 ENCOUNTER — Encounter: Payer: Self-pay | Admitting: Neurology

## 2016-04-11 ENCOUNTER — Ambulatory Visit (INDEPENDENT_AMBULATORY_CARE_PROVIDER_SITE_OTHER): Payer: 59 | Admitting: Neurology

## 2016-04-11 ENCOUNTER — Other Ambulatory Visit: Payer: 59

## 2016-04-11 VITALS — BP 100/60 | HR 68 | Ht 70.0 in | Wt 211.2 lb

## 2016-04-11 DIAGNOSIS — G3184 Mild cognitive impairment, so stated: Secondary | ICD-10-CM | POA: Diagnosis not present

## 2016-04-11 LAB — VITAMIN B12: Vitamin B-12: 935 pg/mL (ref 200–1100)

## 2016-04-11 NOTE — Patient Instructions (Addendum)
1.  Check vitamin B12 2.  Neuropsychological testing   Return to clinic in 6 months

## 2016-04-11 NOTE — Progress Notes (Signed)
[facility] Clinic Note - Initial Visit   Date: 04/11/2016  Jennifer MartinetSusan N Trantham MRN: [id number] DOB: 11/23/1950   Dear Dr. Lawerance BachBurns:  Thank you for your kind referral of Jennifer Sanford for consultation of memory changes. Although her history is well known to you, please allow us to reiterate it for the purpose of our medical record. The patient was accompanied to the clinic by self.   History of Present Illness: Jennifer Sanford is a 65 y.o. right-handed African American female with depression, GERD, hyperlipidemia, and hypertension presenting for evaluation of memory loss.    Starting around Spring 2017, she starting forgetting people names and tasks.  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  He brought it to her attention that she had already called and made the order, but she did not recall the details.  A few weeks ago, she was texting with her children and switched up the locations for the holiday travels for Thanksgiving and Christmas this year, which she had planned.  She often findings herself forgetting little things, such as walking to the refrigerator and forgetting what she needed.  She misplaces items regularly, which has been a habit for many years.   She manages her own finances and medications.   She admits to being constantly busy at work and socially.  She feels like she needs a break and has a vacation schedule in August.  She sleeps well and her mood is good.  She was divorced 6 years ago and feels that for the first time since her divorce, she feels lonely.    Her mother had dementia.   Out-side paper records, electronic medical record, and images have been reviewed where available and summarized as:  Lab Results  Component Value Date   TSH 1.27 01/29/2016    Past  Medical History  Diagnosis Date  . Depression   . GERD (gastroesophageal reflux disease)   . Complication of anesthesia     VERY CLAUSTROPHIC W/ MASK  . OA (osteoarthritis)   . Hyperlipidemia   . 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 Postoperative wound infection   . Hypertension     no meds s/p wt loss  . Hx of transfusion of packed red blood cells     Past Surgical History  Procedure Laterality Date  . Knee arthroscopy  left  2004//   bilateral 1980  . Breast reduction surgery Bilateral 04-12-2008  . Total knee arthroplasty Bilateral 03/17/2013    Procedure: TOTAL KNEE BILATERAL;  Surgeon: Loanne DrillingFrank V Aluisio, MD;  Location: [facility];  Service: Orthopedics;  Laterality: Bilateral;  . Knee closed reduction Bilateral 06/02/2013    Procedure: CLOSED MANIPULATION BILATERAL KNEES;  Surgeon: Loanne DrillingFrank V Aluisio, MD;  Location: [facility];  Service: Orthopedics;  Laterality: Bilateral;  . Tonsillectomy and adenoidectomy  as child  . Exploratory laparotomy w/ right ureter repair  2000  . Cysto/  right retrograde pyelogram/  balloon dilation right ureter stricture/  stent placement  05-25-2010//   03-14-2009//   01-28-2009  . Cardiovascular stress test  12-26-2009    Normal nuclear study/  no ischemia/  ef 65%  . Vaginal hysterectomy  2000    w/ right salpingoophorectomy  . Cystoscopy with stent  placement Right 02/18/2014    Procedure: CYSTOSCOPY WITH right STENT PLACEMENT;  Surgeon: Danae Chen, MD;  Location: [facility];  Service: Urology;  Laterality: Right;  . Ureteral reimplantion Right 09/13/2014    Procedure: OPEN RIGHT URETERAL REIMPLANT;  Surgeon: Crist Fat, MD;  Location: [facility];  Service: Urology;  Laterality: Right;  . Incision and drainage abscess N/A 11/28/2014    Procedure: INCISION AND DRAINAGE ABSCESS;  Surgeon: Crist Fat, MD;  Location: [facility];  Service: Urology;  Laterality: N/A;  .  Incision and drainage abscess N/A 02/15/2015    Procedure: INCISION AND DRAINAGE OF WOUND;  Surgeon: Crist Fat, MD;  Location: [facility];  Service: Urology;  Laterality: N/A;     Medications:  Outpatient Encounter Prescriptions as of 04/11/2016  Medication Sig  . B Complex Vitamins (B COMPLEX 50) TABS daily  . Cholecalciferol (VITAMIN D3) 1000 units CAPS daily  . citalopram (CELEXA) 20 MG tablet Take 1 tablet (20 mg total) by mouth every morning.  . fenofibrate 160 MG tablet Take 160 mg by mouth every morning.  . fluconazole (DIFLUCAN) 150 MG tablet Take 1 tablet (150 mg total) by mouth once.  . pantoprazole (PROTONIX) 20 MG tablet Take 1 tablet (20 mg total) by mouth daily.  Marland Kitchen pyridOXINE (VITAMIN B-6) 100 MG tablet Take 100 mg by mouth every morning.  . triamterene-hydrochlorothiazide (MAXZIDE-25) 37.5-25 MG tablet Take 0.5 tablets by mouth at bedtime.  . vitamin C (ASCORBIC ACID) 500 MG tablet Take 500 mg by mouth daily.   No facility-administered encounter medications on file as of 04/11/2016.     Allergies: No Known Allergies  Family History: Family History  Problem Relation Age of Onset  . Diabetes Mother   . Hypertension Father   . Lung cancer Father   . Cancer Other   . Heart attack Mother   . Dementia Mother   . Heart Problems Brother     CABG  . Bipolar disorder Sister     Social History: Social History  Substance Use Topics  . Smoking status: Never Smoker   . Smokeless tobacco: Never Used  . Alcohol Use: No   Social History   Social History Narrative   [place].  Lives alone in a 2 story home.  Has 3 daughters.  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  Education: college.    Review of Systems:  CONSTITUTIONAL: No fevers, chills, night sweats, or weight loss.   EYES: No visual changes or eye pain ENT: No hearing changes.  No history of nose bleeds.   RESPIRATORY: No cough, wheezing and shortness of breath.     CARDIOVASCULAR: Negative for chest pain, and palpitations.   GI: Negative for abdominal discomfort, blood in stools or black stools.  No recent change in bowel habits.   GU:  No history of incontinence.   MUSCLOSKELETAL: No history of joint pain or swelling.  No myalgias.   SKIN: Negative for lesions, rash, and itching.   HEMATOLOGY/ONCOLOGY: Negative for prolonged bleeding, bruising easily, and swollen nodes.  No history of cancer.   ENDOCRINE: Negative for cold or heat intolerance, polydipsia or goiter.   PSYCH:  No depression or anxiety symptoms.   NEURO: As Above.   Vital Signs:  BP 100/60 mmHg  Pulse 68  Ht 5\' 10"  (1.778 m)  Wt 211 lb 4 oz (95.822 kg)  BMI 30.31 kg/m2  SpO2 97%  Pain Scale: 0 on a scale of 0-10   General Medical Exam:   General:  Well appearing, comfortable.   Eyes/ENT: see cranial nerve examination.   Neck: No masses appreciated.  Full range of motion without tenderness.  No carotid bruits. Respiratory:  Clear to auscultation, good air entry bilaterally.   Cardiac:  Regular rate and rhythm, no murmur.   Extremities:  No deformities, edema, or skin discoloration.  Skin:  No rashes or lesions.  Neurological Exam: MENTAL STATUS including orientation to time, place, person, recent and remote memory, attention span and concentration, language, and fund of knowledge is normal.  Speech is not dysarthric.  Luria test is negative.   Montreal Cognitive Assessment  04/11/2016  Visuospatial/ Executive (0/5) 4  Naming (0/3) 2  Attention: Read list of digits (0/2) 1  Attention: Read list of letters (0/1) 1  Attention: Serial 7 subtraction starting at 100 (0/3) 3  Language: Repeat phrase (0/2) 2  Language : Fluency (0/1) 1  Abstraction (0/2) 2  Delayed Recall (0/5) 3  Orientation (0/6) 6  Total 25    CRANIAL NERVES: II:  No visual field defects.  Unremarkable fundi.   III-IV-VI: Pupils equal round and reactive to light.  Normal conjugate, extra-ocular eye  movements in all directions of gaze.  No nystagmus.  No ptosis.   V:  Normal facial sensation.    VII:  Normal facial symmetry and movements.  No pathologic facial reflexes.  VIII:  Normal hearing and vestibular function.   IX-X:  Normal palatal movement.   XI:  Normal shoulder shrug and head rotation.   XII:  Normal tongue strength and range of motion, no deviation or fasciculation.  MOTOR: Motor strength is 5/5 throughout.  No atrophy, fasciculations or abnormal movements.  No pronator drift.  Tone is normal.    MSRs:  Right                                                                 Left brachioradialis 2+  brachioradialis 2+  biceps 2+  biceps 2+  triceps 2+  triceps 2+  patellar 2+  patellar 2+  ankle jerk 1+  ankle jerk 1+  Hoffman no  Hoffman no  plantar response down  plantar response down   SENSORY:  Normal and symmetric perception of light touch, pinprick, vibration, and proprioception.  Romberg's sign absent.   COORDINATION/GAIT: Normal finger-to- nose-finger and heel-to-shin.  Intact rapid alternating movements bilaterally.  Able to rise from a chair without using arms.  Gait narrow based and stable.  Mild unsteadiness with tandem gait.  Stressed gait intact.    IMPRESSION: Mild cognitive impairment most likely due to anxiety.  She endorses feeling lonely for the first time since her divorce 6 years ago and is finding it challenging accepting her new life.  She stays extremely busy and active and endorses having little time to focus on herself and has recognized feeling overworked so scheduled a vacation in August.  She does not wish to see a counselor at this time.  I will check vitamin B12 level to look for secondary causes of cognitive changes.  She would like also to have baseline neuropsychological testing, which is reasonable.   Return to clinic in 6 months.  The duration of this appointment visit was 45 minutes of face-to-face time with the patient.  Greater  than 50% of this time was spent in counseling, explanation of diagnosis, planning of further management, and coordination of care.   Thank you for allowing me to participate in patient's care.  If I can answer any additional questions, I would be pleased to do so.    Sincerely,    Christyn Gutkowski K. Allena Katz, DO

## 2016-04-12 ENCOUNTER — Other Ambulatory Visit: Payer: Self-pay

## 2016-04-16 ENCOUNTER — Ambulatory Visit (INDEPENDENT_AMBULATORY_CARE_PROVIDER_SITE_OTHER): Payer: 59 | Admitting: Psychology

## 2016-04-16 ENCOUNTER — Encounter: Payer: Self-pay | Admitting: Psychology

## 2016-04-16 DIAGNOSIS — F329 Major depressive disorder, single episode, unspecified: Secondary | ICD-10-CM

## 2016-04-16 DIAGNOSIS — R413 Other amnesia: Secondary | ICD-10-CM | POA: Diagnosis not present

## 2016-04-16 DIAGNOSIS — F32A Depression, unspecified: Secondary | ICD-10-CM

## 2016-04-16 NOTE — Progress Notes (Signed)
NEUROPSYCHOLOGICAL INTERVIEW (CPT: [id number])  Name: Jennifer Sanford Date of Birth: [birth year] Date of Interview: 04/16/2016  Reason for Referral:  Jennifer Sanford is a 65 y.o., single (divorced), right-handed female who is referred for neuropsychological evaluation by Dr. Nita Sickle of [facility] due to concerns about memory loss. She was seen by Dr. Allena Katz on 04/11/2016 and scored 25/30 on the [facility]. This patient is unaccompanied to today's appointment.   History of Presenting Problem:  [name] reported concerns about gradual memory decline characterized by forgetfulness for recent conversations, repeating statements or questions people, displacing and losing items, reduced recall of names of people, and starting but not finishing tasks (at home). Her daughters have noticed these symptoms as well. [name] does feel that some of these cognitive symptoms may be due to having too many things on her mind or doing too many tasks at once. When she is more mindful of what she is doing, she notices reduced cognitive symptoms. Upon direct questioning, the patient denies forgetting appointments or other obligations, forgetting to take medications, difficulty concentrating, word-finding difficulty, comprehension difficulty, visual spatial deficits, getting lost when driving, making wrong turns when driving. 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 She lives alone and manages all instrumental ADLs without any reported difficulty. [name] will be starting [employer] in the near future and is somewhat concerned about her ability to learn and remember new information.  The patient does have a family history of dementia in her mother who demonstrated memory loss in her 41s.   The patient denies any significant depression at the current time. She reported that her mood is stable and generally upbeat. She enjoys her work and has a strong support system and good social network. She is somewhat  interested in finding a new partner/companion. She denied any problems with sleep or appetite. She denied any current psychosocial stressors. She denied any current physical complaints or significant pain.  Psychiatric History: The patient reported a history of situational depression about 6 years ago when she went through a divorce with her husband of 16 years and her mother passed away. She saw a therapist at that time and started Celexa then as well. She reported that the medication has been helpful for her. She continues to take Celexa. The patient denied history of suicidal ideation. She denied history of substance abuse or dependence. Family psychiatric history is significant for bipolar disorder in the patient's sister.  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 Marital history: Divorced. 3 adult daughters with whom she is very close. 4 grandchildren. Alcohol/Tobacco/Substances: The patient does not drink alcohol or use tobacco. She smoked cigarettes for a brief period of time in high school. She does not use any illicit substances.  Medical History: Past Medical History:  Diagnosis Date  . Complication of anesthesia    VERY CLAUSTROPHIC W/ MASK  . Depression   . GERD (gastroesophageal reflux disease)   . 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 Hx of transfusion of packed red blood cells   . Hyperlipidemia   . Hypertension    no meds s/p wt loss  . OA (osteoarthritis)   . Postoperative wound infection     Current Medications:  Outpatient Encounter Prescriptions as of 04/16/2016  Medication Sig  .  B Complex Vitamins (B COMPLEX 50) TABS daily  . Cholecalciferol (VITAMIN D3) 1000 units CAPS daily  . citalopram (CELEXA) 20 MG tablet Take 1 tablet (20 mg total) by  mouth every morning.  . fenofibrate 160 MG tablet Take 160 mg by mouth every morning.  . fluconazole (DIFLUCAN) 150 MG tablet Take 1 tablet (150 mg total) by mouth once.  . pantoprazole (PROTONIX) 20 MG tablet Take 1 tablet (20 mg total) by mouth daily.  Marland Kitchen pyridOXINE (VITAMIN B-6) 100 MG tablet Take 100 mg by mouth every morning.  . triamterene-hydrochlorothiazide (MAXZIDE-25) 37.5-25 MG tablet Take 0.5 tablets by mouth at bedtime.  . vitamin C (ASCORBIC ACID) 500 MG tablet Take 500 mg by mouth daily.   No facility-administered encounter medications on file as of 04/16/2016.     Behavioral Observations:   Appearance: Neatly and appropriately dressed and groomed Gait: Ambulated independently, no abnormalities observed Speech: Fluent; normal rate, rhythm and volume, no word finding difficulty observed Thought process: Linear, goal directed Affect: Full, euthymic Interpersonal: Very pleasant, personable, appropriate   TESTING: There is medical necessity to proceed with neuropsychological assessment as the results will be used to aid in differential diagnosis and clinical decision-making and to inform specific treatment recommendations. Per the patient and medical records reviewed, there has been a change in cognitive functioning and a reasonable suspicion of mild cognitive impairment.    PLAN: The patient will return for a full battery of neuropsychological testing with a psychometrician under my supervision. Education regarding testing procedures was provided. Subsequently, the patient will see this provider for a follow-up session at which time her test performances and my impressions and treatment recommendations will be reviewed in detail.   Full neuropsychological evaluation report to follow.

## 2016-05-09 ENCOUNTER — Encounter: Payer: Self-pay | Admitting: Psychology

## 2016-05-16 ENCOUNTER — Encounter: Payer: Self-pay | Admitting: Psychology

## 2016-06-04 ENCOUNTER — Ambulatory Visit (INDEPENDENT_AMBULATORY_CARE_PROVIDER_SITE_OTHER): Payer: 59 | Admitting: Psychology

## 2016-06-04 DIAGNOSIS — G3184 Mild cognitive impairment, so stated: Secondary | ICD-10-CM

## 2016-06-04 DIAGNOSIS — R413 Other amnesia: Secondary | ICD-10-CM | POA: Diagnosis not present

## 2016-06-04 NOTE — Progress Notes (Signed)
   Neuropsychology Note  Jennifer MartinetSusan N Sanford returned today for 2 hours of neuropsychological testing with technician, Jennifer Sanford, BS, under the supervision of Dr. Elvis CoilMaryBeth Sanford. The patient did not appear overtly distressed by the testing session, per behavioral observation or via self-report to the technician. Rest breaks were offered. Jennifer MartinetSusan N Sanford will return within 2 weeks for a feedback session with Dr. [name] at which time her test performances, clinical impressions and treatment recommendations will be reviewed in detail. The patient understands she can contact our office should she require our assistance before this time.  Full report to follow.

## 2016-06-06 NOTE — Progress Notes (Signed)
NEUROPSYCHOLOGICAL EVALUATION   Name:    Jennifer Sanford  Date of Birth:   [birth year] Date of Interview:  04/16/2016 Date of Testing:  06/04/2016   Date of Feedback:  06/11/2016     Background Information:  Reason for Referral:  Jennifer Sanford is a 65 y.o. female referred by Dr. Nita Sickle to assess her current level of cognitive functioning and assist in differential diagnosis. The current evaluation consisted of a review of available medical records, an interview with the patient, and the completion of a neuropsychological testing battery. Informed consent was obtained.  History of Presenting Problem:  Jennifer Sanford reported concerns about gradual memory decline characterized by forgetfulness for recent conversations, repeating statements or questions people, displacing and losing items, reduced recall of names of people, and starting but not finishing tasks (at home). Her daughters have noticed these symptoms as well. Jennifer Sanford does feel that some of these cognitive symptoms may be due to having too many things on her mind or doing too many tasks at once. When she is more mindful of what she is doing, she notices reduced cognitive symptoms. Upon direct questioning, the patient denies forgetting appointments or other obligations, forgetting to take medications, difficulty concentrating, word-finding difficulty, comprehension difficulty, visual spatial deficits, getting lost when driving, making wrong turns when driving. 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 She lives alone and manages all instrumental ADLs without any reported difficulty. Jennifer Sanford will be starting [employer] in the near future and is somewhat concerned about her ability to learn and remember new information.  The patient does have a family history of dementia in her mother who demonstrated memory loss in her 61s.   The patient denies any significant depression at the current time. She reported that her mood is stable  and generally upbeat. She enjoys her work and has a strong support system and good social network. She is somewhat interested in finding a new partner/companion. She denied any problems with sleep or appetite. She denied any current psychosocial stressors. She denied any current physical complaints or significant pain.  Psychiatric History: The patient reported a history of situational depression about 6 years ago when she went through a divorce with her husband of 16 years and her mother passed away. She saw a therapist at that time and started Celexa then as well. She reported that the medication has been helpful for her. She continues to take Celexa. The patient denied history of suicidal ideation. She denied history of substance abuse or dependence. Family psychiatric history is significant for bipolar disorder in the patient's sister.  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 Marital history: Divorced. 3 adult daughters with whom she is very close. 4 grandchildren. Alcohol/Tobacco/Substances: The patient does not drink alcohol or use tobacco. She smoked cigarettes for a brief period of time in high school. She does not use any illicit substances.    Medical History:  Past Medical History:  Diagnosis Date  . Complication of anesthesia    VERY CLAUSTROPHIC W/ MASK  . Depression   . GERD (gastroesophageal reflux disease)   . 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 Hx of transfusion of packed red blood cells   . Hyperlipidemia   . Hypertension    no meds s/p  wt loss  . OA (osteoarthritis)   . Postoperative wound infection     Current medications:  Outpatient Encounter Prescriptions as of 06/11/2016  Medication Sig  . B Complex Vitamins (B COMPLEX 50) TABS daily    . Cholecalciferol (VITAMIN D3) 1000 units CAPS daily  . citalopram (CELEXA) 20 MG tablet Take 1 tablet (20 mg total) by mouth every morning.  . fenofibrate 160 MG tablet Take 160 mg by mouth every morning.  . fluconazole (DIFLUCAN) 150 MG tablet Take 1 tablet (150 mg total) by mouth once.  . pantoprazole (PROTONIX) 20 MG tablet Take 1 tablet (20 mg total) by mouth daily.  Marland Kitchen pyridOXINE (VITAMIN B-6) 100 MG tablet Take 100 mg by mouth every morning.  . triamterene-hydrochlorothiazide (MAXZIDE-25) 37.5-25 MG tablet Take 0.5 tablets by mouth at bedtime.  . vitamin C (ASCORBIC ACID) 500 MG tablet Take 500 mg by mouth daily.   No facility-administered encounter medications on file as of 06/11/2016.      Current Examination:  Behavioral Observations:  Appearance: Neatly and appropriately dressed and groomed Gait: Ambulated independently, no abnormalities observed Speech: Fluent; normal rate, rhythm and volume, no word finding difficulty observed Thought process: Linear, goal directed Affect: Full, euthymic Interpersonal: Very pleasant, personable, appropriate Orientation: Oriented to all spheres. Could not recall the name of the current President but accurately named his predecessor.  Tests Administered: . Test of Premorbid Functioning (TOPF) . Wechsler Adult Intelligence Scale-Fourth Edition (WAIS-IV): Similarities, Information, Block Design, Matrix Reasoning, Arithmetic, Symbol Search, Coding and Digit Span subtests . Wechsler Memory Scale-Fourth Edition (WMS-IV) Adult Version (ages 67-69): Logical Memory I, II and Recognition subtests  . [place] Verbal Learning Test - 2nd Edition (CVLT-2) Standard Form . Repeatable Battery for the Assessment of Neuropsychological Status (RBANS) Form A:  Figure Copy and Recall subtests . Neuropsychological Assessment Battery (NAB) Language Module, Form 1:  Naming Subtest . Controlled Oral Word Association Test (COWAT) . Trail Making Test A and  B . Clock drawing test . Generalized Anxiety Disorder - 7 item screener (GAD-7) . Beck Depression Inventory - Second edition (BDI-II)  Test Results: Note: Standardized scores are presented only for use by appropriately trained professionals and to allow for any future test-retest comparison. These scores should not be interpreted without consideration of all the information that is contained in the rest of the report. The most recent standardization samples from the test publisher or other sources were used whenever possible to derive standard scores; scores were corrected for age, gender, ethnicity and education when available.   Test Scores:  Test Name Standardized Score Descriptor  TOPF SS= 112 High average  WAIS-IV Subtests    Similarities ss= 14 Superior  Information ss= 6 Low average  Block Design ss= 7 Low average  Matrix Reasoning ss= 6 Low average  Arithmetic ss= 4 Impaired  Symbol Search ss= 8 Low end of average  Coding ss= 11 Average  Digit Span  ss= 9 Average  WAIS Index Scores    Verbal Comprehension SS= 100 Average  Perceptual Reasoning SS= 81 Low average  Working Memory SS= 80 Low average  Processing Speed SS= 97 Average  FSIQ SS= 86 Low average  WMS-IV Subtests    LM I ss= 10 Average  LM II ss= 11 Average  LM II Recognition Cum %:  26-50   RBANS Subtests    Figure Copy Z= -0.1 Average  Figure Recall Z= -0.7 Average  CVLT-II Scores    Trial 1 Z= 2 Very superior  Trial 5 Z= 1 High average  Trials 1-5 total T= 65 Superior  SD Free Recall Z= 0 Average  SD Cued Recall Z= 0 Average  LD Free Recall Z= 0 Average  LD Cued Recall Z= 0 Average  Recognition Discriminability (13/16 hits, 0 false positives) Z= 0 Average  Forced Choice Recognition Raw= 16/16 WNL  NAB Naming T= 55 Average  COWAT-FAS T= 65 Superior  COWAT-Animals T= 61 High average  Trail Making Test A 0 errors T= 52 Average  Trail Making Test B 5 errors T= 48 Average  Clock Drawing  WNL  GAD-7  0/21 WNL   BDI-II 1/63 WNL    Description of Test Results:  Premorbid verbal intellectual abilities were estimated to have been within the high average range based on a test of word reading. Psychomotor processing speed was average. Auditory attention and working memory were variable, ranging from average to impaired. Visual-spatial construction was low average to average. Language abilities were intact. Specifically, confrontation naming was average, and semantic verbal fluency was high average. With regard to verbal memory, encoding and acquisition of non-contextual information (i.e., word list) was superior across five learning trials. After a brief interference task, free recall was average. After a 20-minute delay, free recall was average. Performance on a yes/no recognition task was average. On another verbal memory test, encoding and acquisition of contextual auditory information (i.e., short stories) was average. After a 25-minute delay, free recall was average. Performance on a yes/no recognition task was average. With regard to non-verbal memory, delayed free recall of visual information was average. Executive functioning was generally intact. On a test of mental flexibility and set-shifting (Trails B), she committed five set loss errors but she did complete the task in an average amount of time, suggesting that she quickly recovered from the errors. Verbal fluency with phonemic search restrictions was superior. Verbal abstract reasoning was superior. Non-verbal abstract reasoning was low average. Performance on a clock drawing task was generally within normal limits (time set correctly, spacing mildly off). On self-report questionnaires, the patient did not endorse any symptoms of generalized anxiety, and her responses were not indicative of clinically significant depression (she denied nearly all depressive symptoms as well).    Clinical Impressions: No cognitive disorder or psychiatric disorder  at this time. Patient's performances on testing were largely within normal limits. She did not demonstrate memory dysfunction, semantic retrieval deficits or impaired visual-spatial perception. She did display variable working memory, and there were indicators of impulsivity and/or difficulty with mental flexibility on Trails B. However, her test results do not warrant a diagnosis of a cognitive disorder at this time. These areas of relative weakness may reflect longstanding weaknesses. The patient denied symptoms of clinically significant depression or anxiety at this time, but psychosocial stress could be contributing to subjective cognitive complaints. Behavioral strategies for enhancing attention/concentration and working memory in daily life may be helpful.    Recommendations/Plan: Based on the findings of the present evaluation, the following recommendations are offered:  1. Behavioral strategies for enhancing attention/concentration and working memory in daily life may be helpful. I provided written information and reviewed it with her. We spoke about the importance of mindfulness in enhancing attention/focus in daily life. She is very receptive to this feedback. 2. Results of this evaluation will provide a nice baseline for future comparison if needed.   Feedback to Patient: Simonne MartinetSusan N Luth returned for a feedback appointment on 06/11/2016 to review the results of her neuropsychological evaluation with this  provider. 20 minutes face-to-face time was spent reviewing her test results, my impressions and my recommendations as detailed above. The patient was very pleased with her test results and thankful for the reassurance.   Total time spent on this patient's case: [id number] unit for interview with psychologist; [phone] units of testing by psychometrician under psychologist's supervision; [phone] units for medical record review, scoring of neuropsychological tests, interpretation of test results,  preparation of this report, and review of results to the patient by psychologist.      Thank you for your referral of [facility]. Please feel free to contact me if you have any questions or concerns regarding this report.

## 2016-06-11 ENCOUNTER — Ambulatory Visit (INDEPENDENT_AMBULATORY_CARE_PROVIDER_SITE_OTHER): Payer: 59 | Admitting: Psychology

## 2016-06-11 ENCOUNTER — Encounter: Payer: Self-pay | Admitting: Psychology

## 2016-06-11 DIAGNOSIS — R413 Other amnesia: Secondary | ICD-10-CM | POA: Diagnosis not present

## 2016-06-13 MED FILL — CITALOPRAM HBR 20 MG TABLET: 20 | 90 days supply | Qty: 90 | Fill #1

## 2016-06-13 MED FILL — PANTOPRAZOLE SOD DR 20 MG T: 20 | 90 days supply | Qty: 90 | Fill #1

## 2016-06-13 MED FILL — FENOFIBRATE 160 MG TABLET: 160 | 90 days supply | Qty: 90 | Fill #0

## 2016-06-18 MED FILL — TRIAMTERENE-HCTZ 37.5-25 MG: 37.5-25 | 90 days supply | Qty: 45 | Fill #0

## 2016-06-25 IMAGING — CR DG CHEST 2V
2 series · 2 of 2 positions shown · non-contrast
Comparison: Radiographs 03/12/2013.

CLINICAL DATA: Pre-op for ureteral reimplantation. History of
hypertension. No current chest complaints.

EXAM:
CHEST  2 VIEW

[w chest pa]
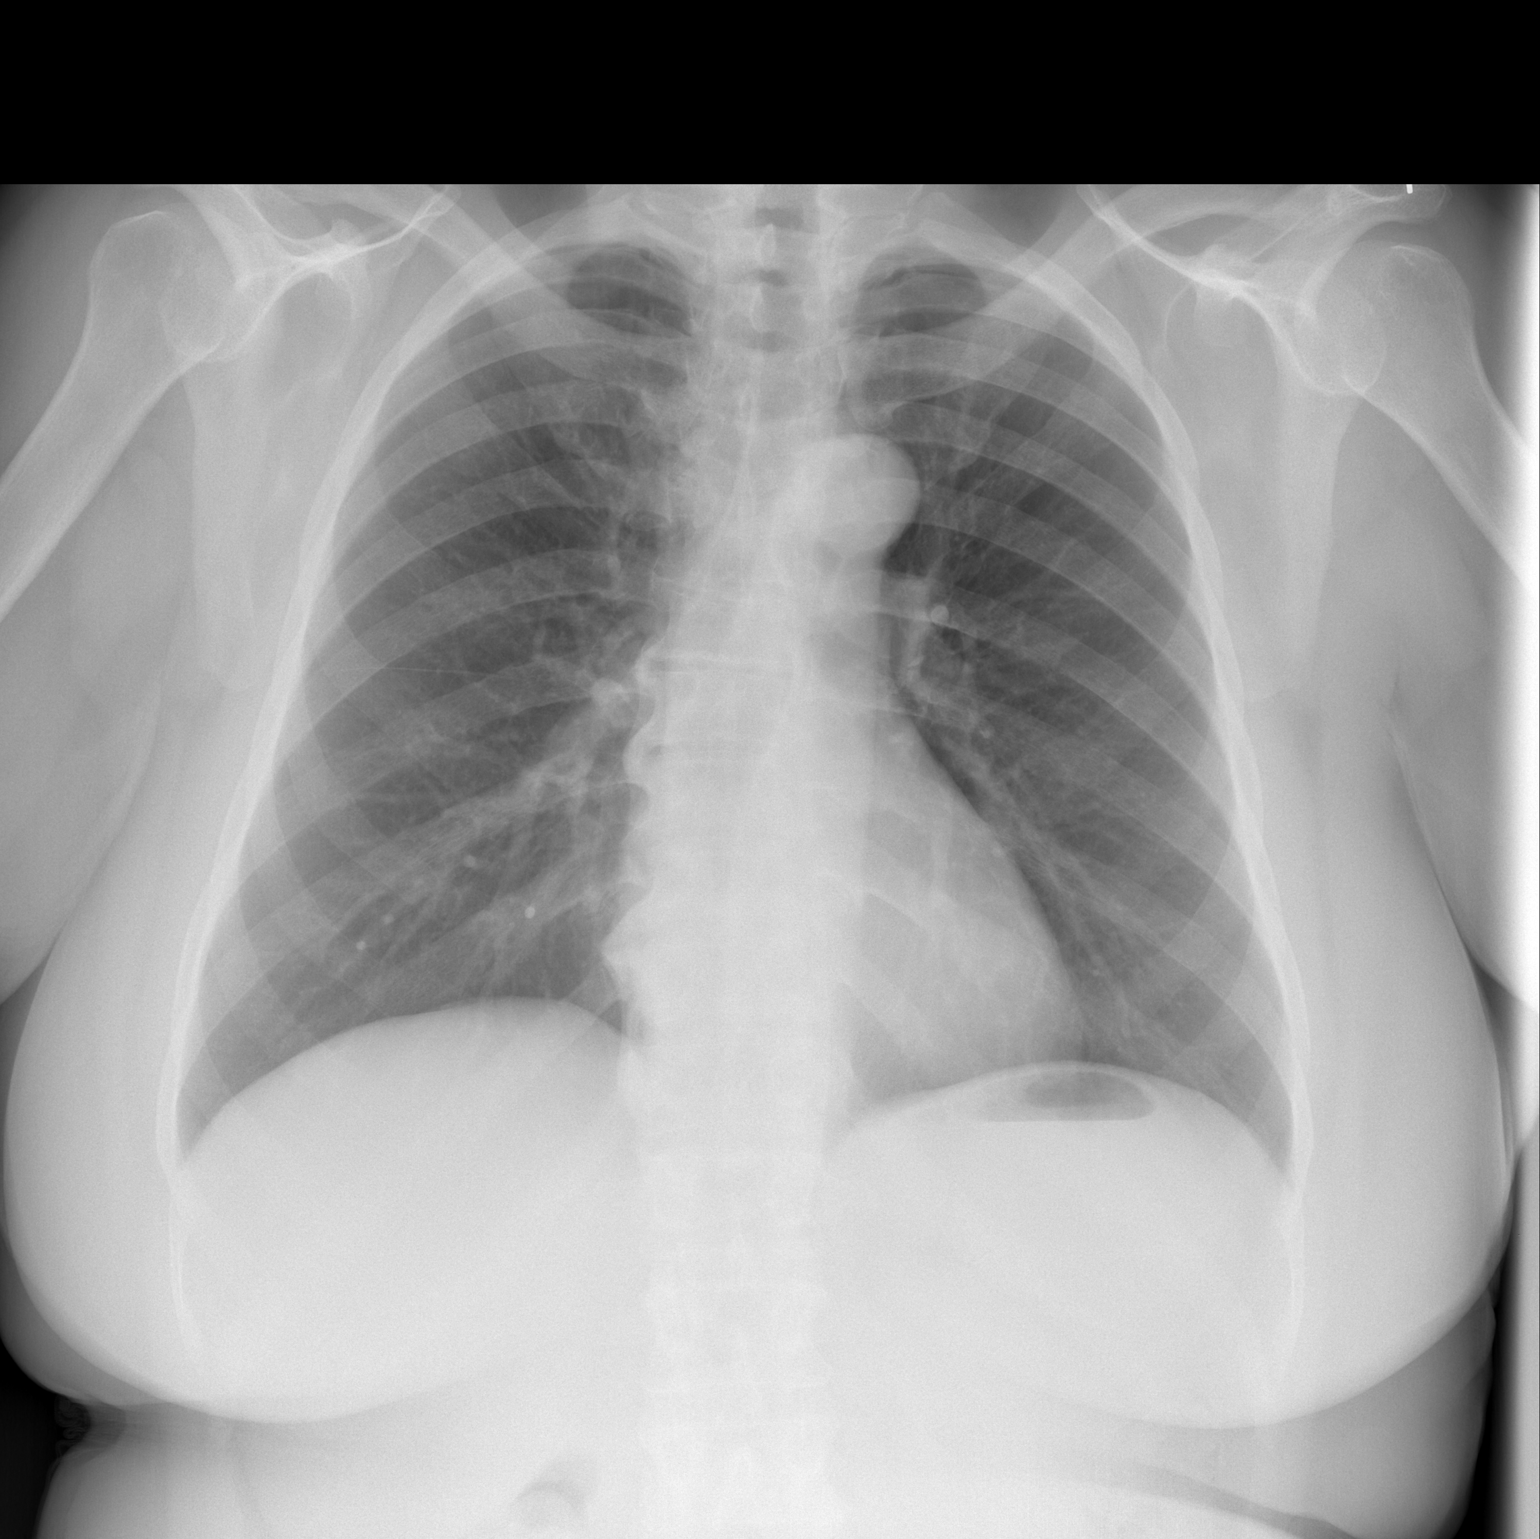

[w chest lat]
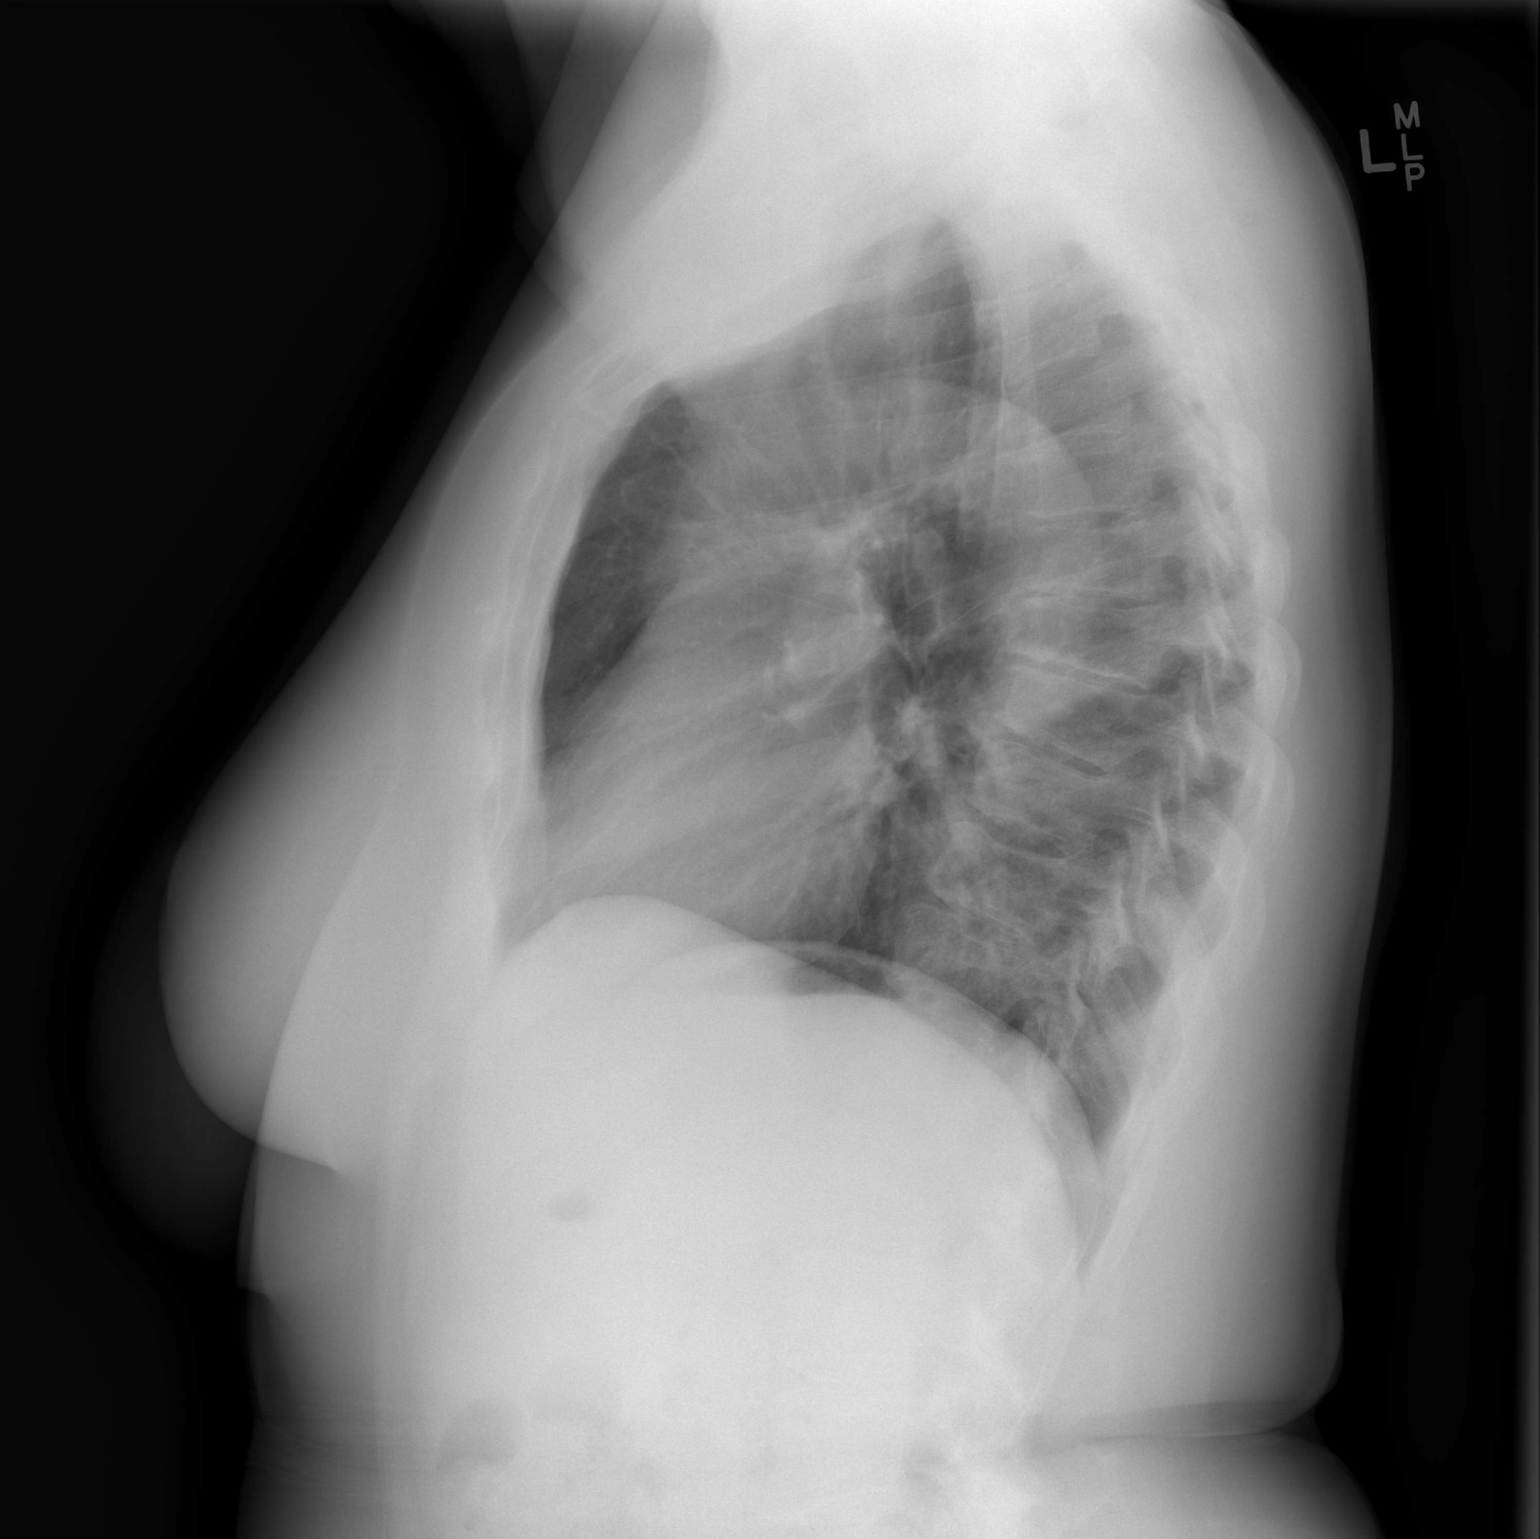

[2 of 2 positions shown; findings below may reference images not displayed]

FINDINGS: The heart size and mediastinal contours are normal. The lungs are
clear. There is no pleural effusion or pneumothorax. No acute
osseous findings are identified. Degenerative changes are present
throughout the spine.
IMPRESSION: No active cardiopulmonary process.

## 2016-08-27 ENCOUNTER — Ambulatory Visit: Payer: 59 | Admitting: Sports Medicine

## 2016-09-03 ENCOUNTER — Ambulatory Visit: Payer: 59 | Admitting: Sports Medicine

## 2016-09-10 ENCOUNTER — Ambulatory Visit: Payer: 59 | Admitting: Sports Medicine

## 2016-09-10 ENCOUNTER — Ambulatory Visit (INDEPENDENT_AMBULATORY_CARE_PROVIDER_SITE_OTHER): Payer: 59 | Admitting: Sports Medicine

## 2016-09-10 ENCOUNTER — Encounter: Payer: Self-pay | Admitting: Sports Medicine

## 2016-09-10 DIAGNOSIS — M79672 Pain in left foot: Secondary | ICD-10-CM | POA: Diagnosis not present

## 2016-09-10 DIAGNOSIS — L84 Corns and callosities: Secondary | ICD-10-CM

## 2016-09-10 DIAGNOSIS — M79671 Pain in right foot: Secondary | ICD-10-CM | POA: Diagnosis not present

## 2016-09-10 DIAGNOSIS — B351 Tinea unguium: Secondary | ICD-10-CM | POA: Diagnosis not present

## 2016-09-10 DIAGNOSIS — L603 Nail dystrophy: Secondary | ICD-10-CM

## 2016-09-10 DIAGNOSIS — L608 Other nail disorders: Secondary | ICD-10-CM | POA: Diagnosis not present

## 2016-09-10 DIAGNOSIS — L601 Onycholysis: Secondary | ICD-10-CM | POA: Diagnosis not present

## 2016-09-10 NOTE — Progress Notes (Signed)
Subjective: Jennifer Sanford is a 65 y.o. female patient seen today in office with complaint of painful thickened callus L>R and discolored nails at both big toes. Patient is desiring treatment for nail changes and for callus. Patient has tried soaking, creams, pads with no relief. Patient has no other pedal complaints at this time.   Patient Active Problem List   Diagnosis Date Noted  . Essential hypertension, benign 01/29/2016  . Memory difficulties 01/29/2016  . Anxiety 01/29/2016  . Depression 01/29/2016  . GERD (gastroesophageal reflux disease) 01/29/2016  . 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 S/P ureteral reimplantation 09/13/2014  . Postoperative stiffness of total knee replacement ([place]) 06/01/2013  . S/P bilateral unicompartmental knee replacement 03/22/2013  . Postop Transfusion 03/21/2013  . Hyponatremia 03/18/2013  . OA (osteoarthritis) of knee 03/17/2013  . TENOSYNOVITIS OF FOOT AND ANKLE 08/28/2009  . FOOT PAIN, RIGHT 08/28/2009  . PES PLANUS 08/28/2009  . ABNORMALITY OF GAIT 08/28/2009    Current Outpatient Prescriptions on File Prior to Visit  Medication Sig Dispense Refill  . B Complex Vitamins (B COMPLEX 50) TABS daily  0  . Cholecalciferol (VITAMIN D3) 1000 units CAPS daily 60 capsule   . citalopram (CELEXA) 20 MG tablet Take 1 tablet (20 mg total) by mouth every morning. 90 tablet 1  . fenofibrate 160 MG tablet Take 160 mg by mouth every morning.    . fluconazole (DIFLUCAN) 150 MG tablet Take 1 tablet (150 mg total) by mouth once. 2 tablet 0  . pantoprazole (PROTONIX) 20 MG tablet Take 1 tablet (20 mg total) by mouth daily. 90 tablet 3  . pyridOXINE (VITAMIN B-6) 100 MG tablet Take 100 mg by mouth every morning.    . triamterene-hydrochlorothiazide (MAXZIDE-25) 37.5-25 MG tablet Take 0.5 tablets by mouth at bedtime. 45 tablet 3  . vitamin C (ASCORBIC ACID) 500 MG tablet Take 500 mg by mouth daily.     No current facility-administered medications on file prior to  visit.     No Known Allergies  Objective: Physical Exam  General: Well developed, nourished, no acute distress, awake, alert and oriented x 3  Vascular: Dorsalis pedis artery 2/4 bilateral, Posterior tibial artery 2/4 bilateral, skin temperature warm to warm proximal to distal bilateral lower extremities, no varicosities, pedal hair present bilateral.  Neurological: Gross sensation present via light touch bilateral.   Dermatological: Skin is warm, dry, and supple bilateral, Nails 1-10 are tender, short thick, and discolored with mild subungal debris most involved bilateral hallux, no webspace macerations present bilateral, no open lesions present bilateral, + hyperkeratotic tissue present bilateral sub met 2 and 5. No signs of infection bilateral.  Musculoskeletal: Hammertoe and prominent met boney deformities noted bilateral. Muscular strength within normal limits without painon range of motion. No pain with calf compression bilateral.  Assessment and Plan:  Problem List Items Addressed This Visit    None    Visit Diagnoses    Callus of foot    -  Primary   Nail dystrophy       Foot pain, bilateral          -Examined patient -Discussed treatment options for painful dystrophic nails and callus -Trimmed callus using chisel blade and medicated areas with saliocaine and bandaid to remove later today -Gave met padding and instructed on use -Advised good shoes and OTC inserts -Fungal culture was obtained from bilateral hallux and sent to [facility] lab -Patient to return in 4 weeks for follow up evaluation and discussion of fungal  culture results or sooner if symptoms worsen.  Landis Martins, DPM

## 2016-09-30 MED FILL — CITALOPRAM HBR 20 MG TABLET: 20 | 90 days supply | Qty: 90 | Fill #0

## 2016-09-30 MED FILL — PANTOPRAZOLE SOD DR 20 MG T: 20 | 90 days supply | Qty: 90 | Fill #2

## 2016-09-30 MED FILL — TRIAMTERENE-HCTZ 37.5-25 MG: 37.5-25 | 90 days supply | Qty: 45 | Fill #1

## 2016-09-30 MED FILL — FENOFIBRATE 160 MG TABLET: 160 | 90 days supply | Qty: 90 | Fill #0

## 2016-10-03 ENCOUNTER — Encounter: Payer: Self-pay | Admitting: Sports Medicine

## 2016-10-03 DIAGNOSIS — R1084 Generalized abdominal pain: Secondary | ICD-10-CM | POA: Diagnosis not present

## 2016-10-08 ENCOUNTER — Ambulatory Visit (INDEPENDENT_AMBULATORY_CARE_PROVIDER_SITE_OTHER): Payer: 59 | Admitting: Sports Medicine

## 2016-10-08 ENCOUNTER — Encounter: Payer: Self-pay | Admitting: Sports Medicine

## 2016-10-08 DIAGNOSIS — L603 Nail dystrophy: Secondary | ICD-10-CM

## 2016-10-08 DIAGNOSIS — M79671 Pain in right foot: Secondary | ICD-10-CM | POA: Diagnosis not present

## 2016-10-08 DIAGNOSIS — M79672 Pain in left foot: Secondary | ICD-10-CM | POA: Diagnosis not present

## 2016-10-08 NOTE — Progress Notes (Signed)
Subjective: Jennifer Sanford is a 66 y.o. female patient seen today in office for fungal culture results. Patient has no other pedal complaints at this time.   Patient Active Problem List   Diagnosis Date Noted  . Essential hypertension, benign 01/29/2016  . Memory difficulties 01/29/2016  . Anxiety 01/29/2016  . Depression 01/29/2016  . GERD (gastroesophageal reflux disease) 01/29/2016  . 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 S/P ureteral reimplantation 09/13/2014  . Postoperative stiffness of total knee replacement ([facility]) 06/01/2013  . S/P bilateral unicompartmental knee replacement 03/22/2013  . Postop Transfusion 03/21/2013  . Hyponatremia 03/18/2013  . OA (osteoarthritis) of knee 03/17/2013  . TENOSYNOVITIS OF FOOT AND ANKLE 08/28/2009  . FOOT PAIN, RIGHT 08/28/2009  . PES PLANUS 08/28/2009  . ABNORMALITY OF GAIT 08/28/2009    Current Outpatient Prescriptions on File Prior to Visit  Medication Sig Dispense Refill  . B Complex Vitamins (B COMPLEX 50) TABS daily  0  . Cholecalciferol (VITAMIN D3) 1000 units CAPS daily 60 capsule   . citalopram (CELEXA) 20 MG tablet Take 1 tablet (20 mg total) by mouth every morning. 90 tablet 1  . fenofibrate 160 MG tablet Take 160 mg by mouth every morning.    . fluconazole (DIFLUCAN) 150 MG tablet Take 1 tablet (150 mg total) by mouth once. 2 tablet 0  . pantoprazole (PROTONIX) 20 MG tablet Take 1 tablet (20 mg total) by mouth daily. 90 tablet 3  . pyridOXINE (VITAMIN B-6) 100 MG tablet Take 100 mg by mouth every morning.    . triamterene-hydrochlorothiazide (MAXZIDE-25) 37.5-25 MG tablet Take 0.5 tablets by mouth at bedtime. 45 tablet 3  . vitamin C (ASCORBIC ACID) 500 MG tablet Take 500 mg by mouth daily.     No current facility-administered medications on file prior to visit.     No Known Allergies  Objective: Physical Exam  General: Well developed, nourished, no acute distress, awake, alert and oriented x 3  Vascular: Dorsalis  pedis artery 2/4 bilateral, Posterior tibial artery 2/4 bilateral, skin temperature warm to warm proximal to distal bilateral lower extremities, no varicosities, pedal hair present bilateral.  Neurological: Gross sensation present via light touch bilateral.   Dermatological: Skin is warm, dry, and supple bilateral, Bilateral hallux nails are short thick, and discolored , no webspace macerations present bilateral, no open lesions present bilateral, minimal plantar callus/hyperkeratotic tissue present bilateral. No signs of infection bilateral.  Musculoskeletal: No symptomatic boney deformities noted bilateral. Muscular strength within normal limits without painon range of motion. No pain with calf compression bilateral.  Fungal culture- negative, consistent with microtrauma  Assessment and Plan:  Problem List Items Addressed This Visit    None    Visit Diagnoses    Nail dystrophy    -  Primary   Foot pain, bilateral         -Examined patient -Discussed treatment options for dystrophic nails without fungus -Patient opt for vinegar soaks once weekly and daily tea  Tree oil -I offered Nuvail to patient but patient elects to use home remedies as above -Advised good hygiene habits -Recommend continue with daily skin emollients for plantar callus and good supportive shoes and inserts -Patient to return as needed for follow up evaluation or sooner if symptoms worsen.  Asencion Islamitorya Syd Newsome, DPM

## 2016-10-14 ENCOUNTER — Ambulatory Visit: Payer: Self-pay | Admitting: Neurology

## 2016-10-17 DIAGNOSIS — H43393 Other vitreous opacities, bilateral: Secondary | ICD-10-CM | POA: Diagnosis not present

## 2016-10-17 DIAGNOSIS — H43811 Vitreous degeneration, right eye: Secondary | ICD-10-CM | POA: Diagnosis not present

## 2017-01-01 MED FILL — TRIAMTERENE-HCTZ 37.5-25 MG: 37.5-25 | 90 days supply | Qty: 45 | Fill #2

## 2017-01-01 MED FILL — FENOFIBRATE 160 MG TABLET: 160 | 90 days supply | Qty: 90 | Fill #1

## 2017-01-01 MED FILL — PANTOPRAZOLE SOD DR 20 MG T: 20 | 90 days supply | Qty: 90 | Fill #3

## 2017-01-31 DIAGNOSIS — S86811A Strain of other muscle(s) and tendon(s) at lower leg level, right leg, initial encounter: Secondary | ICD-10-CM | POA: Diagnosis not present

## 2017-01-31 DIAGNOSIS — Z471 Aftercare following joint replacement surgery: Secondary | ICD-10-CM | POA: Diagnosis not present

## 2017-01-31 DIAGNOSIS — Z96651 Presence of right artificial knee joint: Secondary | ICD-10-CM | POA: Diagnosis not present

## 2017-01-31 DIAGNOSIS — Z96652 Presence of left artificial knee joint: Secondary | ICD-10-CM | POA: Diagnosis not present

## 2017-01-31 DIAGNOSIS — Z96653 Presence of artificial knee joint, bilateral: Secondary | ICD-10-CM | POA: Diagnosis not present

## 2017-03-05 ENCOUNTER — Other Ambulatory Visit: Payer: Self-pay | Admitting: Internal Medicine

## 2017-03-05 DIAGNOSIS — Z1231 Encounter for screening mammogram for malignant neoplasm of breast: Secondary | ICD-10-CM

## 2017-03-19 ENCOUNTER — Ambulatory Visit
Admission: RE | Admit: 2017-03-19 | Discharge: 2017-03-19 | Disposition: A | Discharge status: Home / Self Care | Payer: 59 | Source: Ambulatory Visit | Attending: Internal Medicine | Admitting: Internal Medicine

## 2017-03-19 DIAGNOSIS — Z1231 Encounter for screening mammogram for malignant neoplasm of breast: Secondary | ICD-10-CM

## 2017-04-04 ENCOUNTER — Other Ambulatory Visit: Payer: Self-pay | Admitting: Internal Medicine

## 2017-04-04 MED FILL — FENOFIBRATE 160 MG TABLET: 160 | 90 days supply | Qty: 90 | Fill #0

## 2017-04-04 MED FILL — TRIAMTERENE-HCTZ 37.5-25 MG: 37.5-25 | 90 days supply | Qty: 45 | Fill #0

## 2017-04-04 MED FILL — CITALOPRAM HBR 20 MG TABLET: 20 | 90 days supply | Qty: 90 | Fill #0

## 2017-04-04 MED FILL — PANTOPRAZOLE SOD DR 20 MG T: 20 | 90 days supply | Qty: 90 | Fill #0

## 2017-04-06 NOTE — Progress Notes (Signed)
Subjective:    Patient ID: Jennifer MartinetSusan N Sanford, female    DOB: 10/31/1950, 66 y.o.   MRN: [id number]  HPI She is here for a physical exam.   She denies any changes in her history of health.  She has no concerns.  She feels well overall.    Medications and allergies reviewed with patient and updated if appropriate.  Patient Active Problem List   Diagnosis Date Noted  . Essential hypertension, benign 01/29/2016  . Memory difficulties 01/29/2016  . Anxiety 01/29/2016  . Depression 01/29/2016  . GERD (gastroesophageal reflux disease) 01/29/2016  . 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 S/P ureteral reimplantation 09/13/2014  . S/P bilateral unicompartmental knee replacement 03/22/2013  . Hyponatremia 03/18/2013  . OA (osteoarthritis) of knee 03/17/2013  . TENOSYNOVITIS OF FOOT AND ANKLE 08/28/2009  . PES PLANUS 08/28/2009    Current Outpatient Prescriptions on File Prior to Visit  Medication Sig Dispense Refill  . B Complex Vitamins (B COMPLEX 50) TABS daily  0  . Cholecalciferol (VITAMIN D3) 1000 units CAPS daily 60 capsule    No current facility-administered medications on file prior to visit.     Past Medical History:  Diagnosis Date  . Complication of anesthesia    VERY CLAUSTROPHIC W/ MASK  . Depression   . GERD (gastroesophageal reflux disease)   . 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 Hx of transfusion of packed red blood cells   . Hyperlipidemia   . Hypertension    no meds s/p wt loss  . OA (osteoarthritis)   . Postop Transfusion 03/21/2013  . Postoperative wound infection     Past Surgical History:  Procedure Laterality Date  . BREAST REDUCTION SURGERY Bilateral 04-12-2008  . CARDIOVASCULAR STRESS TEST  12-26-2009   Normal nuclear study/  no ischemia/  ef 65%  . CYSTO/  RIGHT RETROGRADE PYELOGRAM/  BALLOON DILATION RIGHT URETER STRICTURE/  STENT PLACEMENT  05-25-2010//   03-14-2009//   01-28-2009  . CYSTOSCOPY  WITH STENT PLACEMENT Right 02/18/2014   Procedure: CYSTOSCOPY WITH right STENT PLACEMENT;  Surgeon: Danae ChenMarc H Nesi, MD;  [facility] [place];  Service: Urology;  Laterality: Right;  . EXPLORATORY LAPAROTOMY W/ RIGHT URETER REPAIR  2000  . INCISION AND DRAINAGE ABSCESS N/A 11/28/2014   Procedure: INCISION AND DRAINAGE ABSCESS;  Surgeon: Crist FatBenjamin W Herrick, MD;  [facility] [place];  Service: Urology;  Laterality: N/A;  . INCISION AND DRAINAGE ABSCESS N/A 02/15/2015   Procedure: INCISION AND DRAINAGE OF WOUND;  Surgeon: Crist FatBenjamin W Herrick, MD;  [facility] [place];  Service: Urology;  Laterality: N/A;  . KNEE ARTHROSCOPY  left  2004//   bilateral 1980  . KNEE CLOSED REDUCTION Bilateral 06/02/2013   Procedure: CLOSED MANIPULATION BILATERAL KNEES;  Surgeon: Loanne DrillingFrank V Aluisio, MD;  Location: [facility];  Service: Orthopedics;  Laterality: Bilateral;  . REDUCTION MAMMAPLASTY    . TONSILLECTOMY AND ADENOIDECTOMY  as child  . TOTAL KNEE ARTHROPLASTY Bilateral 03/17/2013   Procedure: TOTAL KNEE BILATERAL;  Surgeon: Loanne DrillingFrank V Aluisio, MD;  Location: [facility];  Service: Orthopedics;  Laterality: Bilateral;  . URETERAL REIMPLANTION Right 09/13/2014   Procedure: OPEN RIGHT URETERAL REIMPLANT;  Surgeon: Crist FatBenjamin W Herrick, MD;  Location: [facility];  Service: Urology;  Laterality: Right;  Marland Kitchen. VAGINAL HYSTERECTOMY  2000   w/ right salpingoophorectomy    Social History   Social History  . Marital status: Divorced  Spouse name: N/A  . Number of children: N/A  . Years of education: N/A   Social History Main Topics  . Smoking status: Never Smoker  . Smokeless tobacco: Never Used  . Alcohol use No  . Drug use: No  . Sexual activity: Not on file   Other Topics Concern  . Not on file   Social History Narrative   [facility] [facility] [place] [facility] [place].  Lives alone in a 2 story home.  Has 3 daughters.     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     Education: college.    Family  History  Problem Relation Age of Onset  . Diabetes Mother        Deceased  . Heart attack Mother   . Dementia Mother   . Hypertension Father        Deceased  . Lung cancer Father   . Cancer Other   . Heart Problems Brother        CABG  . Bipolar disorder Sister   . Healthy Daughter   . Breast cancer Maternal Grandmother     Review of Systems  Constitutional: Negative for appetite change, chills, fatigue and fever.  Eyes: Negative for visual disturbance.  Respiratory: Negative for cough, shortness of breath and wheezing.   Cardiovascular: Positive for leg swelling (occasionally). Negative for chest pain and palpitations.  Gastrointestinal: Negative for abdominal pain, blood in stool, constipation, diarrhea and nausea.       Gerd controlled  Genitourinary: Negative for dysuria and hematuria.  Musculoskeletal: Positive for arthralgias (intermittent).  Skin: Negative for color change and rash.  Neurological: Negative for light-headedness and headaches.  Psychiatric/Behavioral: Positive for dysphoric mood. The patient is nervous/anxious.        Objective:   Vitals:   04/07/17 1514  BP: 100/74  Pulse: 70  Resp: 16  Temp: 98.1 F (36.7 C)   Filed Weights   04/07/17 1514  Weight: 204 lb (92.5 kg)   Body mass index is 29.27 kg/m.  Wt Readings from Last 3 Encounters:  04/07/17 204 lb (92.5 kg)  04/11/16 211 lb 4 oz (95.8 kg)  03/18/16 211 lb 12.8 oz (96.1 kg)     Physical Exam Constitutional: She appears well-developed and well-nourished. No distress.  HENT:  Head: Normocephalic and atraumatic.  Right Ear: External ear normal. Normal ear canal and TM Left Ear: External ear normal.  Normal ear canal and TM Mouth/Throat: Oropharynx is clear and moist.  Eyes: Conjunctivae and EOM are normal.  Neck: Neck supple. No tracheal deviation present. No thyromegaly present.  No carotid bruit  Cardiovascular: Normal rate, regular rhythm and normal heart sounds.   No murmur  heard.  No edema. Pulmonary/Chest: Effort normal and breath sounds normal. No respiratory distress. She has no wheezes. She has no rales.  Breast: deferred to Gyn Abdominal: Soft. She exhibits no distension. There is no tenderness.  Lymphadenopathy: She has no cervical adenopathy.  Skin: Skin is warm and dry. She is not diaphoretic.  Psychiatric: She has a normal mood and affect. Her behavior is normal.         Assessment & Plan:   Physical exam: Screening blood work ordered Immunizations  Td up to date through work, discussed shingrix Colonoscopy  Up to date - Dr Loreta Ave Mammogram   Up to date  Gyn  Up to date  - has appt in sept Eye exams   Up to date  EKG  Last done  2015 [facility] - [place] to [facility], walks a lot Weight - working on weight loss Skin  No concerns Substance abuse  none  See Problem List for Assessment and Plan of chronic medical problems.   FU annually

## 2017-04-06 NOTE — Patient Instructions (Addendum)
Test(s) ordered today. Your results will be released to [place] (or called to you) after review, usually within 72hours after test completion. If any changes need to be made, you will be notified at that same time.  All other Health Maintenance issues reviewed.   All recommended immunizations and age-appropriate screenings are up-to-date or discussed.  No immunizations administered today.   Medications reviewed and updated.  No changes recommended at this time.  Your prescription(s) have been submitted to your pharmacy. Please take as directed and contact our office if you believe you are having problem(s) with the medication(s).   Please followup in one year    Health Maintenance, Female Adopting a healthy lifestyle and getting preventive care can go a long way to promote health and wellness. Talk with your health care provider about what schedule of regular examinations is right for you. This is a good chance for you to check in with your provider about disease prevention and staying healthy. In between checkups, there are plenty of things you can do on your own. Experts have done a lot of research about which lifestyle changes and preventive measures are most likely to keep you healthy. Ask your health care provider for more information. Weight and diet Eat a healthy diet  Be sure to include plenty of vegetables, fruits, low-fat dairy products, and lean protein.  Do not eat a lot of foods high in solid fats, added sugars, or salt.  Get regular exercise. This is one of the most important things you can do for your health. ? Most adults should exercise for at least 150 minutes each week. The exercise should increase your heart rate and make you sweat (moderate-intensity exercise). ? Most adults should also do strengthening exercises at least twice a week. This is in addition to the moderate-intensity exercise.  Maintain a healthy weight  Body mass index (BMI) is a measurement that  can be used to identify possible weight problems. It estimates body fat based on height and weight. Your health care provider can help determine your BMI and help you achieve or maintain a healthy weight.  For females 22 years of age and older: ? A BMI below 18.5 is considered underweight. ? A BMI of 18.5 to 24.9 is normal. ? A BMI of 25 to 29.9 is considered overweight. ? A BMI of 30 and above is considered obese.  Watch levels of cholesterol and blood lipids  You should start having your blood tested for lipids and cholesterol at 64 years of age, then have this test every 5 years.  You may need to have your cholesterol levels checked more often if: ? Your lipid or cholesterol levels are high. ? You are older than 64 years of age. ? You are at high risk for heart disease.  Cancer screening Lung Cancer  Lung cancer screening is recommended for adults 45-67 years old who are at high risk for lung cancer because of a history of smoking.  A yearly low-dose CT scan of the lungs is recommended for people who: ? Currently smoke. ? Have quit within the past 15 years. ? Have at least a 30-pack-year history of smoking. A pack year is smoking an average of one pack of cigarettes a day for 1 year.  Yearly screening should continue until it has been 15 years since you quit.  Yearly screening should stop if you develop a health problem that would prevent you from having lung cancer treatment.  Breast Cancer  Practice breast  This means understanding how your breasts normally appear and feel.  It also means doing regular breast self-exams. Let your health care provider know about any changes, no matter how small.  If you are in your 20s or 30s, you should have a clinical breast exam (CBE) by a health care provider every 1-3 years as part of a regular health exam.  If you are 40 or older, have a CBE every year. Also consider having a breast X-ray (mammogram) every year.  If you  have a family history of breast cancer, talk to your health care provider about genetic screening.  If you are at high risk for breast cancer, talk to your health care provider about having an MRI and a mammogram every year.  Breast cancer gene (BRCA) assessment is recommended for women who have family members with BRCA-related cancers. BRCA-related cancers include: ? Breast. ? Ovarian. ? Tubal. ? Peritoneal cancers.  Results of the assessment will determine the need for genetic counseling and BRCA1 and BRCA2 testing.  Cervical Cancer Your health care provider may recommend that you be screened regularly for cancer of the pelvic organs (ovaries, uterus, and vagina). This screening involves a pelvic examination, including checking for microscopic changes to the surface of your cervix (Pap test). You may be encouraged to have this screening done every 3 years, beginning at age 21.  For women ages 30-65, health care providers may recommend pelvic exams and Pap testing every 3 years, or they may recommend the Pap and pelvic exam, combined with testing for human papilloma virus (HPV), every 5 years. Some types of HPV increase your risk of cervical cancer. Testing for HPV may also be done on women of any age with unclear Pap test results.  Other health care providers may not recommend any screening for nonpregnant women who are considered low risk for pelvic cancer and who do not have symptoms. Ask your health care provider if a screening pelvic exam is right for you.  If you have had past treatment for cervical cancer or a condition that could lead to cancer, you need Pap tests and screening for cancer for at least 20 years after your treatment. If Pap tests have been discontinued, your risk factors (such as having a new sexual partner) need to be reassessed to determine if screening should resume. Some women have medical problems that increase the chance of getting cervical cancer. In these cases,  your health care provider may recommend more frequent screening and Pap tests.  Colorectal Cancer  This type of cancer can be detected and often prevented.  Routine colorectal cancer screening usually begins at 64 years of age and continues through 64 years of age.  Your health care provider may recommend screening at an earlier age if you have risk factors for colon cancer.  Your health care provider may also recommend using home test kits to check for hidden blood in the stool.  A small camera at the end of a tube can be used to examine your colon directly (sigmoidoscopy or colonoscopy). This is done to check for the earliest forms of colorectal cancer.  Routine screening usually begins at age 50.  Direct examination of the colon should be repeated every 5-10 years through 64 years of age. However, you may need to be screened more often if early forms of precancerous polyps or small growths are found.  Skin Cancer  Check your skin from head to toe regularly.  Tell your health care provider about any   any new moles or changes in moles, especially if there is a change in a mole's shape or color.  Also tell your health care provider if you have a mole that is larger than the size of a pencil eraser.  Always use sunscreen. Apply sunscreen liberally and repeatedly throughout the day.  Protect yourself by wearing long sleeves, pants, a wide-brimmed hat, and sunglasses whenever you are outside.  Heart disease, diabetes, and high blood pressure  High blood pressure causes heart disease and increases the risk of stroke. High blood pressure is more likely to develop in: ? People who have blood pressure in the high end of the normal range (130-139/85-89 mm Hg). ? People who are overweight or obese. ? People who are African American.  If you are 38-35 years of age, have your blood pressure checked every 3-5 years. If you are 37 years of age or older, have your blood pressure checked every year.  You should have your blood pressure measured twice-once when you are at a hospital or clinic, and once when you are not at a hospital or clinic. Record the average of the two measurements. To check your blood pressure when you are not at a hospital or clinic, you can use: ? An automated blood pressure machine at a pharmacy. ? A home blood pressure monitor.  If you are between 45 years and 85 years old, ask your health care provider if you should take aspirin to prevent strokes.  Have regular diabetes screenings. This involves taking a blood sample to check your fasting blood sugar level. ? If you are at a normal weight and have a low risk for diabetes, have this test once every three years after 64 years of age. ? If you are overweight and have a high risk for diabetes, consider being tested at a younger age or more often. Preventing infection Hepatitis B  If you have a higher risk for hepatitis B, you should be screened for this virus. You are considered at high risk for hepatitis B if: ? You were born in a country where hepatitis B is common. Ask your health care provider which countries are considered high risk. ? Your parents were born in a high-risk country, and you have not been immunized against hepatitis B (hepatitis B vaccine). ? You have HIV or AIDS. ? You use needles to inject street drugs. ? You live with someone who has hepatitis B. ? You have had sex with someone who has hepatitis B. ? You get hemodialysis treatment. ? You take certain medicines for conditions, including cancer, organ transplantation, and autoimmune conditions.  Hepatitis C  Blood testing is recommended for: ? Everyone born from [birth year] through 1963. ? Anyone with known risk factors for hepatitis C.  Sexually transmitted infections (STIs)  You should be screened for sexually transmitted infections (STIs) including gonorrhea and chlamydia if: ? You are sexually active and are younger than 64 years of  age. ? You are older than 64 years of age and your health care provider tells you that you are at risk for this type of infection. ? Your sexual activity has changed since you were last screened and you are at an increased risk for chlamydia or gonorrhea. Ask your health care provider if you are at risk.  If you do not have HIV, but are at risk, it may be recommended that you take a prescription medicine daily to prevent HIV infection. This is called pre-exposure prophylaxis (PrEP). You are considered  at risk if: ? You are sexually active and do not regularly use condoms or know the HIV status of your partner(s). ? You take drugs by injection. ? You are sexually active with a partner who has HIV.  Talk with your health care provider about whether you are at high risk of being infected with HIV. If you choose to begin PrEP, you should first be tested for HIV. You should then be tested every 3 months for as long as you are taking PrEP. Pregnancy  If you are premenopausal and you may become pregnant, ask your health care provider about preconception counseling.  If you may become pregnant, take 400 to 800 micrograms (mcg) of folic acid every day.  If you want to prevent pregnancy, talk to your health care provider about birth control (contraception). Osteoporosis and menopause  Osteoporosis is a disease in which the bones lose minerals and strength with aging. This can result in serious bone fractures. Your risk for osteoporosis can be identified using a bone density scan.  If you are 6 years of age or older, or if you are at risk for osteoporosis and fractures, ask your health care provider if you should be screened.  Ask your health care provider whether you should take a calcium or vitamin D supplement to lower your risk for osteoporosis.  Menopause may have certain physical symptoms and risks.  Hormone replacement therapy may reduce some of these symptoms and risks. Talk to your health  care provider about whether hormone replacement therapy is right for you. Follow these instructions at home:  Schedule regular health, dental, and eye exams.  Stay current with your immunizations.  Do not use any tobacco products including cigarettes, chewing tobacco, or electronic cigarettes.  If you are pregnant, do not drink alcohol.  If you are breastfeeding, limit how much and how often you drink alcohol.  Limit alcohol intake to no more than 1 drink per day for nonpregnant women. One drink equals 12 ounces of beer, 5 ounces of wine, or 1 ounces of hard liquor.  Do not use street drugs.  Do not share needles.  Ask your health care provider for help if you need support or information about quitting drugs.  Tell your health care provider if you often feel depressed.  Tell your health care provider if you have ever been abused or do not feel safe at home. This information is not intended to replace advice given to you by your health care provider. Make sure you discuss any questions you have with your health care provider. Document Released: 03/25/2011 Document Revised: 02/15/2016 Document Reviewed: 06/13/2015 Elsevier Interactive Patient Education  [employer].

## 2017-04-07 ENCOUNTER — Ambulatory Visit (INDEPENDENT_AMBULATORY_CARE_PROVIDER_SITE_OTHER): Payer: 59 | Admitting: Internal Medicine

## 2017-04-07 ENCOUNTER — Other Ambulatory Visit (INDEPENDENT_AMBULATORY_CARE_PROVIDER_SITE_OTHER): Payer: 59

## 2017-04-07 ENCOUNTER — Encounter: Payer: Self-pay | Admitting: Internal Medicine

## 2017-04-07 VITALS — BP 100/74 | HR 70 | Temp 98.1°F | Resp 16 | Ht 70.0 in | Wt 204.0 lb

## 2017-04-07 DIAGNOSIS — Z0000 Encounter for general adult medical examination without abnormal findings: Secondary | ICD-10-CM

## 2017-04-07 DIAGNOSIS — F419 Anxiety disorder, unspecified: Secondary | ICD-10-CM | POA: Diagnosis not present

## 2017-04-07 DIAGNOSIS — I10 Essential (primary) hypertension: Secondary | ICD-10-CM

## 2017-04-07 DIAGNOSIS — F3289 Other specified depressive episodes: Secondary | ICD-10-CM | POA: Diagnosis not present

## 2017-04-07 DIAGNOSIS — K219 Gastro-esophageal reflux disease without esophagitis: Secondary | ICD-10-CM | POA: Diagnosis not present

## 2017-04-07 LAB — CBC WITH DIFFERENTIAL/PLATELET
Basophils Absolute: 0 10*3/uL (ref 0.0–0.1)
Basophils Relative: 0.3 % (ref 0.0–3.0)
Eosinophils Absolute: 0.1 10*3/uL (ref 0.0–0.7)
Eosinophils Relative: 1.2 % (ref 0.0–5.0)
HCT: 38.8 % (ref 36.0–46.0)
Hemoglobin: 13 g/dL (ref 12.0–15.0)
Lymphocytes Relative: 51.7 % — ABNORMAL HIGH (ref 12.0–46.0)
Lymphs Abs: 5.1 10*3/uL — ABNORMAL HIGH (ref 0.7–4.0)
MCHC: 33.5 g/dL (ref 30.0–36.0)
MCV: 87 fl (ref 78.0–100.0)
Monocytes Absolute: 0.7 10*3/uL (ref 0.1–1.0)
Monocytes Relative: 7.3 % (ref 3.0–12.0)
Neutro Abs: 3.9 10*3/uL (ref 1.4–7.7)
Neutrophils Relative %: 39.5 % — ABNORMAL LOW (ref 43.0–77.0)
Platelets: 312 10*3/uL (ref 150.0–400.0)
RBC: 4.46 Mil/uL (ref 3.87–5.11)
RDW: 14.5 % (ref 11.5–15.5)
WBC: 9.9 10*3/uL (ref 4.0–10.5)

## 2017-04-07 LAB — COMPREHENSIVE METABOLIC PANEL
ALT: 12 U/L (ref 0–35)
AST: 19 U/L (ref 0–37)
Albumin: 4.2 g/dL (ref 3.5–5.2)
Alkaline Phosphatase: 69 U/L (ref 39–117)
BUN: 34 mg/dL — ABNORMAL HIGH (ref 6–23)
CO2: 29 mEq/L (ref 19–32)
Calcium: 10.2 mg/dL (ref 8.4–10.5)
Chloride: 100 mEq/L (ref 96–112)
Creatinine, Ser: 1.1 mg/dL (ref 0.40–1.20)
GFR: 64.36 mL/min (ref >60.00)
Glucose, Bld: 95 mg/dL (ref 70–99)
Potassium: 4.6 mEq/L (ref 3.5–5.1)
Sodium: 136 mEq/L (ref 135–145)
Total Bilirubin: 0.4 mg/dL (ref 0.2–1.2)
Total Protein: 7.5 g/dL (ref 6.0–8.3)

## 2017-04-07 LAB — LIPID PANEL
Cholesterol: 215 mg/dL — ABNORMAL HIGH (ref 0–200)
HDL: 57.1 mg/dL (ref >39.00)
LDL Cholesterol: 140 mg/dL — ABNORMAL HIGH (ref 0–99)
NonHDL: 157.61
Total CHOL/HDL Ratio: 4
Triglycerides: 87 mg/dL (ref 0.0–149.0)
VLDL: 17.4 mg/dL (ref 0.0–40.0)

## 2017-04-07 LAB — TSH: TSH: 1.33 u[IU]/mL (ref 0.35–4.50)

## 2017-04-07 LAB — HEMOGLOBIN A1C: Hgb A1c MFr Bld: 5.7 % (ref 4.6–6.5)

## 2017-04-07 MED ORDER — CITALOPRAM HYDROBROMIDE 20 MG PO TABS: 20.0000 mg | ORAL_TABLET | Freq: Every morning | ORAL | 3 refills | Status: DC

## 2017-04-07 MED ORDER — PANTOPRAZOLE SODIUM 20 MG PO TBEC: 20.0000 mg | DELAYED_RELEASE_TABLET | Freq: Every day | ORAL | 3 refills | Status: DC

## 2017-04-07 MED ORDER — FENOFIBRATE 160 MG PO TABS: 160.0000 mg | ORAL_TABLET | Freq: Every morning | ORAL | 3 refills | Status: DC

## 2017-04-07 MED ORDER — TRIAMTERENE-HCTZ 37.5-25 MG PO TABS: 0.5000 | ORAL_TABLET | Freq: Every day | ORAL | 3 refills | Status: DC

## 2017-04-07 NOTE — Assessment & Plan Note (Signed)
BP well controlled, on low side today - she thinks she needs to drink more She monitors her BP at home and it is well controlled Current regimen effective and well tolerated Continue current medications at current doses

## 2017-04-07 NOTE — Assessment & Plan Note (Signed)
Controlled, stable Continue current dose of medication  

## 2017-04-07 NOTE — Assessment & Plan Note (Signed)
GERD controlled Continue daily medication  

## 2017-04-09 ENCOUNTER — Encounter: Payer: Self-pay | Admitting: Internal Medicine

## 2017-04-23 ENCOUNTER — Encounter: Payer: Self-pay | Admitting: Internal Medicine

## 2017-04-28 ENCOUNTER — Encounter: Payer: Self-pay | Admitting: Internal Medicine

## 2017-05-01 DIAGNOSIS — K219 Gastro-esophageal reflux disease without esophagitis: Secondary | ICD-10-CM | POA: Diagnosis not present

## 2017-05-01 DIAGNOSIS — R1310 Dysphagia, unspecified: Secondary | ICD-10-CM | POA: Diagnosis not present

## 2017-05-02 ENCOUNTER — Other Ambulatory Visit: Payer: Self-pay | Admitting: Gastroenterology

## 2017-05-02 NOTE — Progress Notes (Signed)
Jennifer Mcclees MD 

## 2017-05-30 ENCOUNTER — Other Ambulatory Visit: Payer: Self-pay | Admitting: Obstetrics and Gynecology

## 2017-06-02 DIAGNOSIS — Z01419 Encounter for gynecological examination (general) (routine) without abnormal findings: Secondary | ICD-10-CM | POA: Diagnosis not present

## 2017-06-02 DIAGNOSIS — Z6829 Body mass index (BMI) 29.0-29.9, adult: Secondary | ICD-10-CM | POA: Diagnosis not present

## 2017-07-11 MED FILL — CITALOPRAM HBR 20 MG TABLET: 20 | 90 days supply | Qty: 90 | Fill #1

## 2017-07-11 MED FILL — FENOFIBRATE 160 MG TABLET: 160 | 90 days supply | Qty: 90 | Fill #1

## 2017-07-11 MED FILL — PANTOPRAZOLE SOD DR 20 MG T: 20 | 90 days supply | Qty: 90 | Fill #0

## 2017-07-16 MED FILL — TRIAMTERENE/HCTZ 37.5/25 TB: 37.5-25 | 90 days supply | Qty: 45 | Fill #0

## 2017-07-21 ENCOUNTER — Emergency Department (HOSPITAL_COMMUNITY): Payer: 59

## 2017-07-21 ENCOUNTER — Encounter (HOSPITAL_COMMUNITY): Payer: Self-pay

## 2017-07-21 ENCOUNTER — Emergency Department (HOSPITAL_COMMUNITY)
Admission: EM | Admit: 2017-07-21 | Discharge: 2017-07-21 | Disposition: A | Discharge status: Home / Self Care | Payer: 59 | Attending: Emergency Medicine | Admitting: Emergency Medicine

## 2017-07-21 DIAGNOSIS — Y999 Unspecified external cause status: Secondary | ICD-10-CM | POA: Insufficient documentation

## 2017-07-21 DIAGNOSIS — N289 Disorder of kidney and ureter, unspecified: Secondary | ICD-10-CM

## 2017-07-21 DIAGNOSIS — S0990XA Unspecified injury of head, initial encounter: Secondary | ICD-10-CM | POA: Diagnosis not present

## 2017-07-21 DIAGNOSIS — G4489 Other headache syndrome: Secondary | ICD-10-CM | POA: Diagnosis not present

## 2017-07-21 DIAGNOSIS — Y939 Activity, unspecified: Secondary | ICD-10-CM | POA: Insufficient documentation

## 2017-07-21 DIAGNOSIS — R9431 Abnormal electrocardiogram [ECG] [EKG]: Secondary | ICD-10-CM | POA: Diagnosis not present

## 2017-07-21 DIAGNOSIS — I129 Hypertensive chronic kidney disease with stage 1 through stage 4 chronic kidney disease, or unspecified chronic kidney disease: Secondary | ICD-10-CM | POA: Diagnosis not present

## 2017-07-21 DIAGNOSIS — R42 Dizziness and giddiness: Secondary | ICD-10-CM

## 2017-07-21 DIAGNOSIS — Z79899 Other long term (current) drug therapy: Secondary | ICD-10-CM | POA: Diagnosis not present

## 2017-07-21 DIAGNOSIS — W19XXXA Unspecified fall, initial encounter: Secondary | ICD-10-CM

## 2017-07-21 DIAGNOSIS — Z96653 Presence of artificial knee joint, bilateral: Secondary | ICD-10-CM | POA: Insufficient documentation

## 2017-07-21 DIAGNOSIS — N189 Chronic kidney disease, unspecified: Secondary | ICD-10-CM | POA: Diagnosis not present

## 2017-07-21 DIAGNOSIS — W01198A Fall on same level from slipping, tripping and stumbling with subsequent striking against other object, initial encounter: Secondary | ICD-10-CM | POA: Diagnosis not present

## 2017-07-21 DIAGNOSIS — Y929 Unspecified place or not applicable: Secondary | ICD-10-CM | POA: Insufficient documentation

## 2017-07-21 LAB — COMPREHENSIVE METABOLIC PANEL
ALT: 17 U/L (ref 14–54)
AST: 23 U/L (ref 15–41)
Albumin: 3.8 g/dL (ref 3.5–5.0)
Alkaline Phosphatase: 86 U/L (ref 38–126)
Anion gap: 10 (ref 5–15)
BUN: 42 mg/dL — ABNORMAL HIGH (ref 6–20)
CO2: 25 mmol/L (ref 22–32)
Calcium: 9.5 mg/dL (ref 8.9–10.3)
Chloride: 102 mmol/L (ref 101–111)
Creatinine, Ser: 1.25 mg/dL — ABNORMAL HIGH (ref 0.44–1.00)
GFR calc Af Amer: 52 mL/min — ABNORMAL LOW (ref >60)
GFR calc non Af Amer: 44 mL/min — ABNORMAL LOW (ref >60)
Glucose, Bld: 101 mg/dL — ABNORMAL HIGH (ref 65–99)
Potassium: 4.8 mmol/L (ref 3.5–5.1)
Sodium: 137 mmol/L (ref 135–145)
Total Bilirubin: 0.5 mg/dL (ref 0.3–1.2)
Total Protein: 7.2 g/dL (ref 6.5–8.1)

## 2017-07-21 LAB — CBC WITH DIFFERENTIAL/PLATELET
Basophils Absolute: 0 10*3/uL (ref 0.0–0.1)
Basophils Relative: 0 %
Eosinophils Absolute: 0.2 10*3/uL (ref 0.0–0.7)
Eosinophils Relative: 2 %
HCT: 38.4 % (ref 36.0–46.0)
Hemoglobin: 12.6 g/dL (ref 12.0–15.0)
Lymphocytes Relative: 53 %
Lymphs Abs: 4.6 10*3/uL — ABNORMAL HIGH (ref 0.7–4.0)
MCH: 29.2 pg (ref 26.0–34.0)
MCHC: 32.8 g/dL (ref 30.0–36.0)
MCV: 89.1 fL (ref 78.0–100.0)
Monocytes Absolute: 0.6 10*3/uL (ref 0.1–1.0)
Monocytes Relative: 7 %
Neutro Abs: 3.2 10*3/uL (ref 1.7–7.7)
Neutrophils Relative %: 38 %
Platelets: 307 10*3/uL (ref 150–400)
RBC: 4.31 MIL/uL (ref 3.87–5.11)
RDW: 14.1 % (ref 11.5–15.5)
WBC: 8.6 10*3/uL (ref 4.0–10.5)

## 2017-07-21 LAB — TROPONIN I: Troponin I: <0.03 ng/mL (ref <0.03)

## 2017-07-21 MED ORDER — ACETAMINOPHEN 325 MG PO TABS
650.0000 mg | ORAL_TABLET | Freq: Once | ORAL | Status: AC
  Administered 2017-07-21: 650 mg via ORAL
  Filled 2017-07-21: qty 2

## 2017-07-21 NOTE — ED Provider Notes (Signed)
[facility]-EMERGENCY DEPT Provider Note   CSN: [id number] Arrival date & time: 07/21/17  1815     History   Chief Complaint Chief Complaint  Patient presents with  . Fall  . Headache    HPI Jennifer Sanford is a 66 y.o. female.  Pt comes in with c/o fall. She states that she was standing and bent at the hip and she lost her balance and fell back and hit her head on the locker. Pt was unable to storp herself from falling. She was dazed after the fall but didn't have a loc. She has not had cp or sob. Beside the headache she is not having any symptoms. No dizziness or room spinning. She does having a history of hypotension with her blood pressure medication but she states that she usually knows when it is low and she didn't feel that way today.      Past Medical History:  Diagnosis Date  . Complication of anesthesia    VERY CLAUSTROPHIC W/ MASK  . Depression   . GERD (gastroesophageal reflux disease)   . 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 Hx of transfusion of packed red blood cells   . Hyperlipidemia   . Hypertension    no meds s/p wt loss  . OA (osteoarthritis)   . Postop Transfusion 03/21/2013  . Postoperative wound infection     Patient Active Problem List   Diagnosis Date Noted  . Essential hypertension, benign 01/29/2016  . Memory difficulties 01/29/2016  . Anxiety 01/29/2016  . Depression 01/29/2016  . GERD (gastroesophageal reflux disease) 01/29/2016  . 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 S/P ureteral reimplantation 09/13/2014  . S/P bilateral unicompartmental knee replacement 03/22/2013  . Hyponatremia 03/18/2013  . OA (osteoarthritis) of knee 03/17/2013  . TENOSYNOVITIS OF FOOT AND ANKLE 08/28/2009  . PES PLANUS 08/28/2009    Past Surgical History:  Procedure Laterality Date  . BREAST REDUCTION SURGERY Bilateral 04-12-2008  . CARDIOVASCULAR STRESS TEST  12-26-2009   Normal nuclear  study/  no ischemia/  ef 65%  . CYSTO/  RIGHT RETROGRADE PYELOGRAM/  BALLOON DILATION RIGHT URETER STRICTURE/  STENT PLACEMENT  05-25-2010//   03-14-2009//   01-28-2009  . CYSTOSCOPY WITH STENT PLACEMENT Right 02/18/2014   Procedure: CYSTOSCOPY WITH right STENT PLACEMENT;  Surgeon: Danae Chen, MD;  Location: [facility];  Service: Urology;  Laterality: Right;  . EXPLORATORY LAPAROTOMY W/ RIGHT URETER REPAIR  2000  . INCISION AND DRAINAGE ABSCESS N/A 11/28/2014   Procedure: INCISION AND DRAINAGE ABSCESS;  Surgeon: Crist Fat, MD;  Location: [facility];  Service: Urology;  Laterality: N/A;  . INCISION AND DRAINAGE ABSCESS N/A 02/15/2015   Procedure: INCISION AND DRAINAGE OF WOUND;  Surgeon: Crist Fat, MD;  Location: [facility];  Service: Urology;  Laterality: N/A;  . KNEE ARTHROSCOPY  left  2004//   bilateral 1980  . KNEE CLOSED REDUCTION Bilateral 06/02/2013   Procedure: CLOSED MANIPULATION BILATERAL KNEES;  Surgeon: Loanne Drilling, MD;  Location: [facility];  Service: Orthopedics;  Laterality: Bilateral;  . REDUCTION MAMMAPLASTY    . TONSILLECTOMY AND ADENOIDECTOMY  as child  . TOTAL KNEE ARTHROPLASTY Bilateral 03/17/2013   Procedure: TOTAL KNEE BILATERAL;  Surgeon: Loanne Drilling, MD;  Location: [facility];  Service: Orthopedics;  Laterality: Bilateral;  . URETERAL REIMPLANTION Right 09/13/2014   Procedure: OPEN RIGHT URETERAL REIMPLANT;  Surgeon: Earle Gell  Marlou Porch, MD;  Location: [facility];  Service: Urology;  Laterality: Right;  Marland Kitchen VAGINAL HYSTERECTOMY  2000   w/ right salpingoophorectomy    OB History    No data available       Home Medications    Prior to Admission medications   Medication Sig Start Date End Date Taking? Authorizing Provider  B Complex Vitamins (B COMPLEX 50) TABS daily 01/29/16   Pincus Sanes, MD  Cholecalciferol (VITAMIN D3) 1000 units CAPS daily 01/29/16   Pincus Sanes, MD  citalopram (CELEXA) 20 MG tablet  Take 1 tablet (20 mg total) by mouth every morning. 04/07/17   Pincus Sanes, MD  fenofibrate 160 MG tablet Take 1 tablet (160 mg total) by mouth every morning. 04/07/17   Pincus Sanes, MD  pantoprazole (PROTONIX) 20 MG tablet Take 1 tablet (20 mg total) by mouth daily. 04/07/17   Pincus Sanes, MD  triamterene-hydrochlorothiazide (MAXZIDE-25) 37.5-25 MG tablet Take 0.5 tablets by mouth at bedtime. 04/07/17   Pincus Sanes, MD    Family History Family History  Problem Relation Age of Onset  . Diabetes Mother        Deceased  . Heart attack Mother   . Dementia Mother   . Hypertension Father        Deceased  . Lung cancer Father   . Cancer Other   . Heart Problems Brother        CABG  . Bipolar disorder Sister   . Healthy Daughter   . Breast cancer Maternal Grandmother     Social History Social History  Substance Use Topics  . Smoking status: Never Smoker  . Smokeless tobacco: Never Used  . Alcohol use No     Allergies   Patient has no known allergies.   Review of Systems Review of Systems  All other systems reviewed and are negative.    Physical Exam Updated Vital Signs BP 113/82 (BP Location: Right Arm)   Pulse 63   Temp 98 F (36.7 C) (Oral)   Resp 16   Ht 5' 10.5" (1.791 m)   Wt 90.7 kg (200 lb)   SpO2 100%   BMI 28.29 kg/m   Physical Exam  Constitutional: She is oriented to person, place, and time. She appears well-developed and well-nourished.  HENT:  Hematoma noted to the right scalp  Eyes: Pupils are equal, round, and reactive to light. EOM are normal.  Neck: Normal range of motion. Neck supple.  Cardiovascular: Normal rate.   Pulmonary/Chest: Effort normal.  Abdominal: Soft. Bowel sounds are normal.  Musculoskeletal: Normal range of motion.  Neurological: She is oriented to person, place, and time. She exhibits normal muscle tone. Coordination normal.  Skin: Skin is warm and dry.  Psychiatric: She has a normal mood and affect.  Nursing note  and vitals reviewed.    ED Treatments / Results  Labs (all labs ordered are listed, but only abnormal results are displayed) Labs Reviewed  COMPREHENSIVE METABOLIC PANEL - Abnormal; Notable for the following:       Result Value   Glucose, Bld 101 (*)    BUN 42 (*)    Creatinine, Ser 1.25 (*)    GFR calc non Af Amer 44 (*)    GFR calc Af Amer 52 (*)    All other components within normal limits  CBC WITH DIFFERENTIAL/PLATELET - Abnormal; Notable for the following:    Lymphs Abs 4.6 (*)    All other components within  normal limits  TROPONIN I    EKG  EKG Interpretation None       Radiology Dg Chest 2 View  Result Date: 07/21/2017 CLINICAL DATA:  Dizziness EXAM: CHEST  2 VIEW COMPARISON:  09/09/2014 FINDINGS: The heart size and mediastinal contours are within normal limits. Both lungs are clear. Stable degenerative disc disease of the midthoracic spine. IMPRESSION: No active cardiopulmonary disease. Electronically Signed   By: Tollie Ethavid  Kwon M.D.   On: 07/21/2017 20:09    Procedures Procedures (including critical care time)  Medications Ordered in ED Medications  acetaminophen (TYLENOL) tablet 650 mg (650 mg Oral Given 07/21/17 2023)     Initial Impression / Assessment and Plan / ED Course  I have reviewed the triage vital signs and the nursing notes.  Pertinent labs & imaging results that were available during my care of the patient were reviewed by me and considered in my medical decision making (see chart for details).  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 She verbalized understanding. Discussed with her that don't think anything will be done in pt that can't be done out pt.Told her that she needs to see her doctor this  week to have follow up and if anything changes then she needs to return. Pt is agreement with plan.   Final Clinical Impressions(s) / ED Diagnoses   Final diagnoses:  Injury of head, initial encounter  Fall, initial encounter  Renal insufficiency    New Prescriptions New Prescriptions   No medications on file     Teressa Lowerickering, Jalene Lacko, NP 07/21/17 2321    Lorre NickAllen, Anthony, MD 07/23/17 1232

## 2017-07-21 NOTE — ED Notes (Signed)
NP at bedside.

## 2017-07-21 NOTE — ED Notes (Signed)
Pt ambulatory and independent at discharge.  Verbalized understanding of discharge instructions 

## 2017-07-21 NOTE — ED Notes (Signed)
Delay in blood draw due to patient being in CT scan

## 2017-07-21 NOTE — ED Triage Notes (Signed)
Per EMS, Pt c/o head pain after [facility] and [facility].  Pain score 4/10.  Pt reports losing balance and falling.  Denies LOC and dizziness.

## 2017-07-21 NOTE — Discharge Instructions (Signed)
Follow up with your doctor as discussed you need to have the labs rechecked by friday

## 2017-07-22 ENCOUNTER — Encounter: Payer: Self-pay | Admitting: Internal Medicine

## 2017-07-22 ENCOUNTER — Ambulatory Visit (INDEPENDENT_AMBULATORY_CARE_PROVIDER_SITE_OTHER): Payer: 59 | Admitting: Internal Medicine

## 2017-07-22 VITALS — BP 106/68 | HR 65 | Temp 97.8°F | Resp 16 | Wt 205.0 lb

## 2017-07-22 DIAGNOSIS — I10 Essential (primary) hypertension: Secondary | ICD-10-CM | POA: Diagnosis not present

## 2017-07-22 DIAGNOSIS — S0990XA Unspecified injury of head, initial encounter: Secondary | ICD-10-CM

## 2017-07-22 DIAGNOSIS — N289 Disorder of kidney and ureter, unspecified: Secondary | ICD-10-CM | POA: Diagnosis not present

## 2017-07-22 DIAGNOSIS — K219 Gastro-esophageal reflux disease without esophagitis: Secondary | ICD-10-CM | POA: Diagnosis not present

## 2017-07-22 DIAGNOSIS — N183 Chronic kidney disease, stage 3 unspecified: Secondary | ICD-10-CM | POA: Insufficient documentation

## 2017-07-22 MED ORDER — RANITIDINE HCL 300 MG PO TABS: 300.0000 mg | ORAL_TABLET | Freq: Every day | ORAL | 3 refills | Status: DC

## 2017-07-22 MED FILL — raNITIdine HCL 300 MG TABS: 300 | 90 days supply | Qty: 90 | Fill #0

## 2017-07-22 NOTE — Patient Instructions (Addendum)
  Test(s) ordered today. Your results will be released to MyChart (or called to you) after review, usually within 72hours after test completion. If any changes need to be made, you will be notified at that same time.   Medications reviewed and updated.  Changes include start zantac at night and taper off the protonix slowly.   Your prescription(s) have been submitted to your pharmacy. Please take as directed and contact our office if you believe you are having problem(s) with the medication(s).

## 2017-07-22 NOTE — Assessment & Plan Note (Signed)
Controlled Ideally want to d/c protonix - taper slowly off and d/c if gerd controlled Start zantac 300 mg at night

## 2017-07-22 NOTE — Assessment & Plan Note (Signed)
Controlled, but ? Over controlled at times Continue to monitor - may need to change medication so she is not getting hypotension - discussed the dangers of that as well as elevated BP Continue current medication for now

## 2017-07-22 NOTE — Assessment & Plan Note (Signed)
dehydration is contributing - she will increase water intake Will try to get her off the PPI Avoid nsaids Recheck BMP in the next couple of weeks

## 2017-07-22 NOTE — Assessment & Plan Note (Signed)
Soft tissue swelling posterior head and headaches - besides headaches - no other symptoms of concussion No work for the next couple of days - may need extension depending on headache Advised rest Tylenol prn Call if no improvement

## 2017-07-22 NOTE — Progress Notes (Signed)
Subjective:    Patient ID: Jennifer Sanford, female    DOB: 1951-05-01, 66 y.o.   MRN: [id number]  HPI The patient is here for follow up from the hospital.     She went to the hospital yesterday after she fell and hit her head.  She was standing and bent over and lost her balance.  She fell backward and hit her head on the locker.  There is no loss of consciousness.  She denies any other injuries.  There were no prodromal symptoms.  She does have a history of hypotension.  She thinks that may have been the case.  Her vital signs were normal.  She had a hematoma noted on her right scalp, but her exam was otherwise normal.  She had an EKG, chest x-ray, CT of the head and blood work done in the emergency room.  She received Tylenol.  Her chest x-ray was normal.  CT of her head showed a nonspecific thickened bony calvarium question chronic anemia although the radiologist stated that the most common cause was a normal variant.  Dilantin could also cause this thickening.  There is no acute intracranial abnormality.  CBC was normal.  Troponin was negative.  There was a decrease in her kidney function, which was different from 3 months ago and she was advised to follow-up.  She is here today for that reason.  She was not drinking enough and agrees she is dehydration. She did discuss this with he urologist and has a follow up with him in one month.  She will make sure she is drinking more.    She has not taken any nsaids.   She is currently taking tylenol only for her headaches.  She denies dizziness, lightheadedness, nausea and changes in vision.   BP in 100's, sometimes in 90's, and occasionally her BP will get up to the 130's.  Some days she will not take the medication.    GERD:  She is taking her medication daily as prescribed.  She denies any GERD symptoms and feels her GERD is well controlled.    Medications and allergies reviewed with patient and updated if appropriate.  Patient Active Problem  List   Diagnosis Date Noted  . Essential hypertension, benign 01/29/2016  . Memory difficulties 01/29/2016  . Anxiety 01/29/2016  . Depression 01/29/2016  . GERD (gastroesophageal reflux disease) 01/29/2016  . 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 S/P ureteral reimplantation 09/13/2014  . S/P bilateral unicompartmental knee replacement 03/22/2013  . OA (osteoarthritis) of knee 03/17/2013  . TENOSYNOVITIS OF FOOT AND ANKLE 08/28/2009  . PES PLANUS 08/28/2009    Current Outpatient Prescriptions on File Prior to Visit  Medication Sig Dispense Refill  . B Complex Vitamins (B COMPLEX 50) TABS daily  0  . Cholecalciferol (VITAMIN D3) 1000 units CAPS daily 60 capsule   . citalopram (CELEXA) 20 MG tablet Take 1 tablet (20 mg total) by mouth every morning. 90 tablet 3  . fenofibrate 160 MG tablet Take 1 tablet (160 mg total) by mouth every morning. 90 tablet 3  . pantoprazole (PROTONIX) 20 MG tablet Take 1 tablet (20 mg total) by mouth daily. 90 tablet 3  . triamterene-hydrochlorothiazide (MAXZIDE-25) 37.5-25 MG tablet Take 0.5 tablets by mouth at bedtime. 45 tablet 3   No current facility-administered medications on file prior to visit.     Past Medical History:  Diagnosis Date  . Complication of anesthesia    VERY CLAUSTROPHIC W/ MASK  .  Depression   . GERD (gastroesophageal reflux disease)   . 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 Hx of transfusion of packed red blood cells   . Hyperlipidemia   . Hypertension    no meds s/p wt loss  . OA (osteoarthritis)   . Postop Transfusion 03/21/2013  . Postoperative wound infection     Past Surgical History:  Procedure Laterality Date  . BREAST REDUCTION SURGERY Bilateral 04-12-2008  . CARDIOVASCULAR STRESS TEST  12-26-2009   Normal nuclear study/  no ischemia/  ef 65%  . CYSTO/  RIGHT RETROGRADE PYELOGRAM/  BALLOON DILATION RIGHT URETER STRICTURE/  STENT PLACEMENT  05-25-2010//    03-14-2009//   01-28-2009  . CYSTOSCOPY WITH STENT PLACEMENT Right 02/18/2014   Procedure: CYSTOSCOPY WITH right STENT PLACEMENT;  Surgeon: Danae Chen, MD;  Location: [facility];  Service: Urology;  Laterality: Right;  . EXPLORATORY LAPAROTOMY W/ RIGHT URETER REPAIR  2000  . INCISION AND DRAINAGE ABSCESS N/A 11/28/2014   Procedure: INCISION AND DRAINAGE ABSCESS;  Surgeon: Crist Fat, MD;  Location: [facility];  Service: Urology;  Laterality: N/A;  . INCISION AND DRAINAGE ABSCESS N/A 02/15/2015   Procedure: INCISION AND DRAINAGE OF WOUND;  Surgeon: Crist Fat, MD;  Location: [facility];  Service: Urology;  Laterality: N/A;  . KNEE ARTHROSCOPY  left  2004//   bilateral 1980  . KNEE CLOSED REDUCTION Bilateral 06/02/2013   Procedure: CLOSED MANIPULATION BILATERAL KNEES;  Surgeon: Loanne Drilling, MD;  Location: [facility];  Service: Orthopedics;  Laterality: Bilateral;  . REDUCTION MAMMAPLASTY    . TONSILLECTOMY AND ADENOIDECTOMY  as child  . TOTAL KNEE ARTHROPLASTY Bilateral 03/17/2013   Procedure: TOTAL KNEE BILATERAL;  Surgeon: Loanne Drilling, MD;  Location: [facility];  Service: Orthopedics;  Laterality: Bilateral;  . URETERAL REIMPLANTION Right 09/13/2014   Procedure: OPEN RIGHT URETERAL REIMPLANT;  Surgeon: Crist Fat, MD;  Location: [facility];  Service: Urology;  Laterality: Right;  Marland Kitchen VAGINAL HYSTERECTOMY  2000   w/ right salpingoophorectomy    Social History   Social History  . Marital status: Divorced    Spouse name: N/A  . Number of children: N/A  . Years of education: N/A   Social History Main Topics  . Smoking status: Never Smoker  . Smokeless tobacco: Never Used  . Alcohol use No  . Drug use: No  . Sexual activity: Not Asked   Other Topics Concern  . None   Social History Narrative   [place] [facility] [place].  Lives alone in a 2 story home.  Has 3 daughters.     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      Education: college.    Family History  Problem Relation Age of Onset  . Diabetes Mother        Deceased  . Heart attack Mother   . Dementia Mother   . Hypertension Father        Deceased  . Lung cancer Father   . Cancer Other   . Heart Problems Brother        CABG  . Bipolar disorder Sister   . Healthy Daughter   . Breast cancer Maternal Grandmother     Review of Systems  Eyes: Negative for visual disturbance.  Gastrointestinal: Negative for nausea.  Neurological: Positive for headaches. Negative for dizziness, weakness, light-headedness and numbness.  Psychiatric/Behavioral:  Negative for dysphoric mood. The patient is not nervous/anxious.        Objective:   Vitals:   07/22/17 1309  BP: 106/68  Pulse: 65  Resp: 16  Temp: 97.8 F (36.6 C)  SpO2: 98%   Wt Readings from Last 3 Encounters:  07/22/17 205 lb (93 kg)  07/21/17 200 lb (90.7 kg)  04/07/17 204 lb (92.5 kg)   Body mass index is 29 kg/m.   Physical Exam    Constitutional: Appears well-developed and well-nourished. No distress.  HENT:  Head: posterior head soft tissue swelling - tender.  Neck: Neck supple. No tracheal deviation present. No thyromegaly present.  No cervical lymphadenopathy Cardiovascular: Normal rate, regular rhythm and normal heart sounds.   No murmur heard. No carotid bruit .  No edema Pulmonary/Chest: Effort normal and breath sounds normal. No respiratory distress. No has no wheezes. No rales.  Skin: Skin is warm and dry. Not diaphoretic.  Psychiatric: Normal mood and affect. Behavior is normal.     Assessment & Plan:    See Problem List for Assessment and Plan of chronic medical problems.

## 2017-07-25 ENCOUNTER — Encounter: Payer: Self-pay | Admitting: Internal Medicine

## 2017-08-11 DIAGNOSIS — N1330 Unspecified hydronephrosis: Secondary | ICD-10-CM | POA: Diagnosis not present

## 2017-10-30 MED FILL — PANTOPRAZOLE SOD DR 20 MG T: 20 | 90 days supply | Qty: 90 | Fill #1

## 2017-10-30 MED FILL — FENOFIBRATE 160 MG TABLET: 160 | 90 days supply | Qty: 90 | Fill #0

## 2017-10-30 MED FILL — TRIAMTERENE/HCTZ 37.5/25 TB: 37.5-25 | 90 days supply | Qty: 45 | Fill #1

## 2017-10-30 MED FILL — CITALOPRAM HBR 20 MG TABLET: 20 | 90 days supply | Qty: 90 | Fill #0

## 2018-02-10 MED FILL — PANTOPRAZOLE SOD DR 20 MG T: 20 | 90 days supply | Qty: 90 | Fill #2

## 2018-02-10 MED FILL — CITALOPRAM HBR 20 MG TABLET: 20 | 90 days supply | Qty: 90 | Fill #1

## 2018-02-10 MED FILL — FENOFIBRATE 160 MG TABLET: 160 | 90 days supply | Qty: 90 | Fill #1

## 2018-02-10 MED FILL — TRIAMTERENE/HCTZ 37.5/25 TB: 37.5-25 | 90 days supply | Qty: 45 | Fill #2

## 2018-03-02 ENCOUNTER — Other Ambulatory Visit (INDEPENDENT_AMBULATORY_CARE_PROVIDER_SITE_OTHER): Payer: 59

## 2018-03-02 ENCOUNTER — Ambulatory Visit: Payer: 59 | Admitting: Internal Medicine

## 2018-03-02 ENCOUNTER — Ambulatory Visit (INDEPENDENT_AMBULATORY_CARE_PROVIDER_SITE_OTHER)
Admission: RE | Admit: 2018-03-02 | Discharge: 2018-03-02 | Disposition: A | Discharge status: Home / Self Care | Payer: 59 | Source: Ambulatory Visit | Attending: Internal Medicine | Admitting: Internal Medicine

## 2018-03-02 ENCOUNTER — Encounter: Payer: Self-pay | Admitting: Internal Medicine

## 2018-03-02 DIAGNOSIS — R140 Abdominal distension (gaseous): Secondary | ICD-10-CM | POA: Diagnosis not present

## 2018-03-02 DIAGNOSIS — R109 Unspecified abdominal pain: Secondary | ICD-10-CM | POA: Diagnosis not present

## 2018-03-02 DIAGNOSIS — R1011 Right upper quadrant pain: Secondary | ICD-10-CM | POA: Diagnosis not present

## 2018-03-02 DIAGNOSIS — R1110 Vomiting, unspecified: Secondary | ICD-10-CM | POA: Diagnosis not present

## 2018-03-02 DIAGNOSIS — R112 Nausea with vomiting, unspecified: Secondary | ICD-10-CM | POA: Diagnosis not present

## 2018-03-02 LAB — COMPREHENSIVE METABOLIC PANEL
ALT: 12 U/L (ref 0–35)
AST: 19 U/L (ref 0–37)
Albumin: 4 g/dL (ref 3.5–5.2)
Alkaline Phosphatase: 74 U/L (ref 39–117)
BUN: 16 mg/dL (ref 6–23)
CO2: 31 mEq/L (ref 19–32)
Calcium: 9.7 mg/dL (ref 8.4–10.5)
Chloride: 103 mEq/L (ref 96–112)
Creatinine, Ser: 1.07 mg/dL (ref 0.40–1.20)
GFR: 66.25 mL/min (ref >60.00)
Glucose, Bld: 79 mg/dL (ref 70–99)
Potassium: 4.1 mEq/L (ref 3.5–5.1)
Sodium: 141 mEq/L (ref 135–145)
Total Bilirubin: 0.4 mg/dL (ref 0.2–1.2)
Total Protein: 7.2 g/dL (ref 6.0–8.3)

## 2018-03-02 LAB — AMYLASE: Amylase: 44 U/L (ref 27–131)

## 2018-03-02 LAB — CBC WITH DIFFERENTIAL/PLATELET
Basophils Absolute: 0 10*3/uL (ref 0.0–0.1)
Basophils Relative: 0.5 % (ref 0.0–3.0)
Eosinophils Absolute: 0.1 10*3/uL (ref 0.0–0.7)
Eosinophils Relative: 1.7 % (ref 0.0–5.0)
HCT: 37.5 % (ref 36.0–46.0)
Hemoglobin: 12.6 g/dL (ref 12.0–15.0)
Lymphocytes Relative: 49 % — ABNORMAL HIGH (ref 12.0–46.0)
Lymphs Abs: 3.6 10*3/uL (ref 0.7–4.0)
MCHC: 33.8 g/dL (ref 30.0–36.0)
MCV: 88.4 fl (ref 78.0–100.0)
Monocytes Absolute: 0.6 10*3/uL (ref 0.1–1.0)
Monocytes Relative: 7.5 % (ref 3.0–12.0)
Neutro Abs: 3.1 10*3/uL (ref 1.4–7.7)
Neutrophils Relative %: 41.3 % — ABNORMAL LOW (ref 43.0–77.0)
Platelets: 291 10*3/uL (ref 150.0–400.0)
RBC: 4.24 Mil/uL (ref 3.87–5.11)
RDW: 14.3 % (ref 11.5–15.5)
WBC: 7.4 10*3/uL (ref 4.0–10.5)

## 2018-03-02 LAB — LIPASE: Lipase: 19 U/L (ref 11.0–59.0)

## 2018-03-02 MED ORDER — IOPAMIDOL (ISOVUE-300) INJECTION 61%
100.0000 mL | Freq: Once | INTRAVENOUS | Status: AC | PRN
  Administered 2018-03-02: 100 mL via INTRAVENOUS

## 2018-03-02 NOTE — Progress Notes (Signed)
Subjective:    Patient ID: Jennifer Sanford, female    DOB: 1951/03/12, 67 y.o.   MRN: [id number]  HPI The patient is here for an acute visit.  Abdominal pain, nausea: She has had 3 episodes of abdominal pain and nausea.  One time was a couple of weeks ago.  She had sudden onset abdominal pain, fever, dizziness and overall just felt bad.  By the time she went home she vomited and had diarrhea.  It took her 1 day to recover.  She did have some mild symptoms after that occurred, but not severe until the end of last week.  3 days ago she was at work and she had sudden onset of abdominal pain, nausea and diarrhea.  The abdominal pain is focused to the right upper quadrant to central abdomen.  She still feels nauseous now-it has not improved.  She still has bloating.  She is burping and passing a lot of gas.  Since her symptoms started 3 days ago she has had intermittent diarrhea with some normal stools.  Today she did have a normal bowel movement.  She denies any blood in the stool.  After surgery in 2015 she did have a bowel obstruction secondary to ileus.  She states her current symptoms feel somewhat similar.  Her episodes have not occurred after eating.  She still feels like eating, but nothing tastes good order sits right.   She denies any urinary symptoms.  Medications and allergies reviewed with patient and updated if appropriate.  Patient Active Problem List   Diagnosis Date Noted  . Head injury 07/22/2017  . Renal insufficiency 07/22/2017  . Essential hypertension, benign 01/29/2016  . Memory difficulties 01/29/2016  . Anxiety 01/29/2016  . Depression 01/29/2016  . GERD (gastroesophageal reflux disease) 01/29/2016  . 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 S/P ureteral reimplantation 09/13/2014  . S/P bilateral unicompartmental knee replacement 03/22/2013  . OA (osteoarthritis) of knee 03/17/2013  . TENOSYNOVITIS OF FOOT AND ANKLE 08/28/2009  . PES PLANUS 08/28/2009    Current  Outpatient Medications on File Prior to Visit  Medication Sig Dispense Refill  . B Complex Vitamins (B COMPLEX 50) TABS daily  0  . Cholecalciferol (VITAMIN D3) 1000 units CAPS daily 60 capsule   . citalopram (CELEXA) 20 MG tablet Take 1 tablet (20 mg total) by mouth every morning. 90 tablet 3  . fenofibrate 160 MG tablet Take 1 tablet (160 mg total) by mouth every morning. 90 tablet 3  . pantoprazole (PROTONIX) 20 MG tablet Take 1 tablet (20 mg total) by mouth daily. 90 tablet 3  . ranitidine (ZANTAC) 300 MG tablet Take 1 tablet (300 mg total) by mouth at bedtime. 90 tablet 3  . triamterene-hydrochlorothiazide (MAXZIDE-25) 37.5-25 MG tablet Take 0.5 tablets by mouth at bedtime. 45 tablet 3   No current facility-administered medications on file prior to visit.     Past Medical History:  Diagnosis Date  . Complication of anesthesia    VERY CLAUSTROPHIC W/ MASK  . Depression   . GERD (gastroesophageal reflux disease)   . 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 Hx of transfusion of packed red blood cells   . Hyperlipidemia   . Hypertension    no meds s/p wt loss  . OA (osteoarthritis)   . Postop Transfusion 03/21/2013  . Postoperative wound infection     Past Surgical History:  Procedure Laterality Date  . BREAST REDUCTION SURGERY  Bilateral 04-12-2008  . CARDIOVASCULAR STRESS TEST  12-26-2009   Normal nuclear study/  no ischemia/  ef 65%  . CYSTO/  RIGHT RETROGRADE PYELOGRAM/  BALLOON DILATION RIGHT URETER STRICTURE/  STENT PLACEMENT  05-25-2010//   03-14-2009//   01-28-2009  . CYSTOSCOPY WITH STENT PLACEMENT Right 02/18/2014   Procedure: CYSTOSCOPY WITH right STENT PLACEMENT;  Surgeon: Danae ChenMarc H Nesi, MD;  [facility] [place];  Service: Urology;  Laterality: Right;  . EXPLORATORY LAPAROTOMY W/ RIGHT URETER REPAIR  2000  . INCISION AND DRAINAGE ABSCESS N/A 11/28/2014   Procedure: INCISION AND DRAINAGE ABSCESS;  Surgeon: Crist FatBenjamin  W Herrick, MD;  [facility] [place];  Service: Urology;  Laterality: N/A;  . INCISION AND DRAINAGE ABSCESS N/A 02/15/2015   Procedure: INCISION AND DRAINAGE OF WOUND;  Surgeon: Crist FatBenjamin W Herrick, MD;  [facility] [place];  Service: Urology;  Laterality: N/A;  . KNEE ARTHROSCOPY  left  2004//   bilateral 1980  . KNEE CLOSED REDUCTION Bilateral 06/02/2013   Procedure: CLOSED MANIPULATION BILATERAL KNEES;  Surgeon: Loanne DrillingFrank V Aluisio, MD;  Location: [facility];  Service: Orthopedics;  Laterality: Bilateral;  . REDUCTION MAMMAPLASTY    . TONSILLECTOMY AND ADENOIDECTOMY  as child  . TOTAL KNEE ARTHROPLASTY Bilateral 03/17/2013   Procedure: TOTAL KNEE BILATERAL;  Surgeon: Loanne DrillingFrank V Aluisio, MD;  [facility]: [facility];  Service: Orthopedics;  Laterality: Bilateral;  . URETERAL REIMPLANTION Right 09/13/2014   Procedure: OPEN RIGHT URETERAL REIMPLANT;  Surgeon: Crist FatBenjamin W Herrick, MD;  [facility];  Service: Urology;  Laterality: Right;  Marland Kitchen. VAGINAL HYSTERECTOMY  2000   w/ right salpingoophorectomy    Social History   Socioeconomic History  . Marital status: Divorced    Spouse name: Not on file  . Number of children: Not on file  . Years of education: Not on file  . Highest education level: Not on file  Occupational History  . Not on file  Social Needs  . Financial resource strain: Not on file  . Food insecurity:    Worry: Not on file    Inability: Not on file  . Transportation needs:    Medical: Not on file    Non-medical: Not on file  Tobacco Use  . Smoking status: Never Smoker  . Smokeless tobacco: Never Used  Substance and Sexual Activity  . Alcohol use: No    Alcohol/week: 0.0 oz  . Drug use: No  . Sexual activity: Not on file  Lifestyle  . Physical activity:    Days per week: Not on file    Minutes per session: Not on file  . Stress: Not on file  Relationships  . Social connections:    Talks on phone: Not on file    Gets together: Not on file     Attends religious service: Not on file    Active member of club or organization: Not on file    Attends meetings of clubs or organizations: Not on file    Relationship status: Not on file  Other Topics Concern  . Not on file  Social History Narrative   [facility].  Lives alone in a 2 story home.  Has 3 daughters.     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     Education: college.    Family History  Problem Relation Age of Onset  . Diabetes Mother        Deceased  . Heart attack Mother   .  Dementia Mother   . Hypertension Father        Deceased  . Lung cancer Father   . Cancer Other   . Heart Problems Brother        CABG  . Bipolar disorder Sister   . Healthy Daughter   . Breast cancer Maternal Grandmother     Review of Systems  Constitutional: Negative for appetite change, chills and fever.  Gastrointestinal: Positive for abdominal pain (RUQ - dull), diarrhea, nausea and vomiting. Negative for blood in stool.       Burping  Genitourinary: Negative for dysuria and hematuria.  Neurological: Positive for dizziness.       Objective:   Vitals:   03/02/18 1050  BP: 110/62  Pulse: 75  Resp: 16  Temp: 98.7 F (37.1 C)  SpO2: 98%   BP Readings from Last 3 Encounters:  03/02/18 110/62  07/22/17 106/68  07/21/17 (!) 143/91   Wt Readings from Last 3 Encounters:  03/02/18 204 lb (92.5 kg)  07/22/17 205 lb (93 kg)  07/21/17 200 lb (90.7 kg)   Body mass index is 28.86 kg/m.   Physical Exam  Constitutional: She appears well-developed and well-nourished. No distress.  HENT:  Head: Normocephalic and atraumatic.  Cardiovascular: Normal rate, regular rhythm and normal heart sounds.  Pulmonary/Chest: Effort normal and breath sounds normal. No respiratory distress. She has no wheezes.  Abdominal: Soft. She exhibits no distension. There is tenderness (Right upper quadrant to umbilicus-mild tenderness). There is no rebound and no guarding.  Large midline scar  from pubic bone to umbilicus  Musculoskeletal: She exhibits no edema.  Skin: Skin is warm and dry. She is not diaphoretic.           Assessment & Plan:    See Problem List for Assessment and Plan of chronic medical problems.

## 2018-03-02 NOTE — Patient Instructions (Signed)
Have blood work today.   Have the CT scan done and we will call you with the results.

## 2018-03-02 NOTE — Assessment & Plan Note (Signed)
Episodic right upper quadrant pain associated with nausea vomiting, abdominal bloating and some diarrhea Is not always occurred after meals CBC, CMP, amylase and lipase CT of the abdomen and pelvis to rule out partial bowel obstruction, cholecystitis

## 2018-03-02 NOTE — Assessment & Plan Note (Signed)
Associated with right upper quadrant pain Plan as above

## 2018-03-03 ENCOUNTER — Other Ambulatory Visit: Payer: Self-pay | Admitting: Internal Medicine

## 2018-03-03 ENCOUNTER — Telehealth: Payer: Self-pay | Admitting: Emergency Medicine

## 2018-03-03 DIAGNOSIS — R140 Abdominal distension (gaseous): Secondary | ICD-10-CM | POA: Diagnosis not present

## 2018-03-03 DIAGNOSIS — R1011 Right upper quadrant pain: Secondary | ICD-10-CM | POA: Diagnosis not present

## 2018-03-03 DIAGNOSIS — R110 Nausea: Secondary | ICD-10-CM

## 2018-03-03 DIAGNOSIS — R1033 Periumbilical pain: Secondary | ICD-10-CM | POA: Diagnosis not present

## 2018-03-03 DIAGNOSIS — K219 Gastro-esophageal reflux disease without esophagitis: Secondary | ICD-10-CM | POA: Diagnosis not present

## 2018-03-03 DIAGNOSIS — R194 Change in bowel habit: Secondary | ICD-10-CM | POA: Diagnosis not present

## 2018-03-03 MED ORDER — DICYCLOMINE HCL 20 MG PO TABS: 20.0000 mg | ORAL_TABLET | Freq: Four times a day (QID) | ORAL | 1 refills | Status: DC | PRN

## 2018-03-03 MED FILL — ONDANSETRON HCL 8 MG TABLET: 8 | 10 days supply | Qty: 30 | Fill #0

## 2018-03-03 NOTE — Telephone Encounter (Signed)
Spoke with pt, she was able to get in to see Dr Charna ElizabethJyothi Mann, [facility], today at 3 pm. I will send over ov notes, ct and lab results. Please re enter referral as it has already been sent to [facility]

## 2018-03-03 NOTE — Telephone Encounter (Signed)
Already ordered

## 2018-03-04 ENCOUNTER — Encounter: Payer: Self-pay | Admitting: Internal Medicine

## 2018-03-04 ENCOUNTER — Ambulatory Visit
Admission: RE | Admit: 2018-03-04 | Discharge: 2018-03-04 | Disposition: A | Discharge status: Home / Self Care | Payer: 59 | Source: Ambulatory Visit | Attending: Internal Medicine | Admitting: Internal Medicine

## 2018-03-04 DIAGNOSIS — R110 Nausea: Secondary | ICD-10-CM

## 2018-03-04 DIAGNOSIS — R1011 Right upper quadrant pain: Secondary | ICD-10-CM | POA: Diagnosis not present

## 2018-03-05 ENCOUNTER — Other Ambulatory Visit: Payer: Self-pay | Admitting: Gastroenterology

## 2018-03-05 DIAGNOSIS — R1011 Right upper quadrant pain: Secondary | ICD-10-CM

## 2018-03-09 ENCOUNTER — Ambulatory Visit (HOSPITAL_COMMUNITY)
Admission: RE | Admit: 2018-03-09 | Discharge: 2018-03-09 | Disposition: A | Discharge status: Home / Self Care | Payer: 59 | Source: Ambulatory Visit | Attending: Gastroenterology | Admitting: Gastroenterology

## 2018-03-09 DIAGNOSIS — R1011 Right upper quadrant pain: Secondary | ICD-10-CM | POA: Diagnosis not present

## 2018-03-09 MED ORDER — TECHNETIUM TC 99M MEBROFENIN IV KIT
5.3800 | PACK | Freq: Once | INTRAVENOUS | Status: AC | PRN
  Administered 2018-03-09: 5.38 via INTRAVENOUS

## 2018-03-10 ENCOUNTER — Encounter: Payer: 59 | Admitting: Internal Medicine

## 2018-03-10 DIAGNOSIS — E781 Pure hyperglyceridemia: Secondary | ICD-10-CM | POA: Insufficient documentation

## 2018-03-10 DIAGNOSIS — Z0289 Encounter for other administrative examinations: Secondary | ICD-10-CM

## 2018-03-10 DIAGNOSIS — E785 Hyperlipidemia, unspecified: Secondary | ICD-10-CM | POA: Insufficient documentation

## 2018-03-10 NOTE — Progress Notes (Signed)
Subjective:    Patient ID: Jennifer Sanford, female    DOB: 09/24/1950, 67 y.o.   MRN: [id number]  HPI     Medications and allergies reviewed with patient and updated if appropriate.  Patient Active Problem List   Diagnosis Date Noted  . Abdominal distension (gaseous) 03/02/2018  . Right upper quadrant pain 03/02/2018  . Head injury 07/22/2017  . Renal insufficiency 07/22/2017  . Essential hypertension, benign 01/29/2016  . Memory difficulties 01/29/2016  . Anxiety 01/29/2016  . Depression 01/29/2016  . GERD (gastroesophageal reflux disease) 01/29/2016  . 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 S/P ureteral reimplantation 09/13/2014  . S/P bilateral unicompartmental knee replacement 03/22/2013  . OA (osteoarthritis) of knee 03/17/2013  . TENOSYNOVITIS OF FOOT AND ANKLE 08/28/2009  . PES PLANUS 08/28/2009    Current Outpatient Medications on File Prior to Visit  Medication Sig Dispense Refill  . B Complex Vitamins (B COMPLEX 50) TABS daily  0  . Cholecalciferol (VITAMIN D3) 1000 units CAPS daily 60 capsule   . citalopram (CELEXA) 20 MG tablet Take 1 tablet (20 mg total) by mouth every morning. 90 tablet 3  . dicyclomine (BENTYL) 20 MG tablet Take 1 tablet (20 mg total) by mouth every 6 (six) hours as needed for spasms. 90 tablet 1  . fenofibrate 160 MG tablet Take 1 tablet (160 mg total) by mouth every morning. 90 tablet 3  . pantoprazole (PROTONIX) 20 MG tablet Take 1 tablet (20 mg total) by mouth daily. 90 tablet 3  . ranitidine (ZANTAC) 300 MG tablet Take 1 tablet (300 mg total) by mouth at bedtime. 90 tablet 3  . triamterene-hydrochlorothiazide (MAXZIDE-25) 37.5-25 MG tablet Take 0.5 tablets by mouth at bedtime. 45 tablet 3   No current facility-administered medications on file prior to visit.     Past Medical History:  Diagnosis Date  . Complication of anesthesia    VERY CLAUSTROPHIC W/ MASK  . Depression   . GERD (gastroesophageal reflux disease)   . History of  ureter repair    right ureteral reimplant for stricture 09-03-2014  . Hx of transfusion of packed red blood cells   . Hyperlipidemia   . Hypertension    no meds s/p wt loss  . OA (osteoarthritis)   . Postop Transfusion 03/21/2013  . Postoperative wound infection     Past Surgical History:  Procedure Laterality Date  . BREAST REDUCTION SURGERY Bilateral 04-12-2008  . CARDIOVASCULAR STRESS TEST  12-26-2009   Normal nuclear study/  no ischemia/  ef 65%  . CYSTO/  RIGHT RETROGRADE PYELOGRAM/  BALLOON DILATION RIGHT URETER STRICTURE/  STENT PLACEMENT  05-25-2010//   03-14-2009//   01-28-2009  . CYSTOSCOPY WITH STENT PLACEMENT Right 02/18/2014   Procedure: CYSTOSCOPY WITH right STENT PLACEMENT;  Surgeon: Danae ChenMarc H Nesi, MD;  [facility] [place];  Service: Urology;  Laterality: Right;  . EXPLORATORY LAPAROTOMY W/ RIGHT URETER REPAIR  2000  . INCISION AND DRAINAGE ABSCESS N/A 11/28/2014   Procedure: INCISION AND DRAINAGE ABSCESS;  Surgeon: Crist FatBenjamin W Herrick, MD;  [facility] [place];  Service: Urology;  Laterality: N/A;  . INCISION AND DRAINAGE ABSCESS N/A 02/15/2015   Procedure: INCISION AND DRAINAGE OF WOUND;  Surgeon: Crist FatBenjamin W Herrick, MD;  [facility] [place];  Service: Urology;  Laterality: N/A;  . KNEE ARTHROSCOPY  left  2004//   bilateral 1980  . KNEE CLOSED REDUCTION Bilateral 06/02/2013   Procedure: CLOSED MANIPULATION BILATERAL KNEES;  Surgeon: Homero FellersFrank  Dulcy Fanny, MD;  [facility];  Service: Orthopedics;  Laterality: Bilateral;  . REDUCTION MAMMAPLASTY    . TONSILLECTOMY AND ADENOIDECTOMY  as child  . TOTAL KNEE ARTHROPLASTY Bilateral 03/17/2013   Procedure: TOTAL KNEE BILATERAL;  Surgeon: Loanne Drilling, MD;  [facility];  Service: Orthopedics;  Laterality: Bilateral;  . URETERAL REIMPLANTION Right 09/13/2014   Procedure: OPEN RIGHT URETERAL REIMPLANT;  Surgeon: Crist Fat, MD;  [facility];  Service: Urology;   Laterality: Right;  Marland Kitchen VAGINAL HYSTERECTOMY  2000   w/ right salpingoophorectomy    Social History   Socioeconomic History  . Marital status: Divorced    Spouse name: Not on file  . Number of children: Not on file  . Years of education: Not on file  . Highest education level: Not on file  Occupational History  . Not on file  Social Needs  . Financial resource strain: Not on file  . Food insecurity:    Worry: Not on file    Inability: Not on file  . Transportation needs:    Medical: Not on file    Non-medical: Not on file  Tobacco Use  . Smoking status: Never Smoker  . Smokeless tobacco: Never Used  Substance and Sexual Activity  . Alcohol use: No    Alcohol/week: 0.0 oz  . Drug use: No  . Sexual activity: Not on file  Lifestyle  . Physical activity:    Days per week: Not on file    Minutes per session: Not on file  . Stress: Not on file  Relationships  . Social connections:    Talks on phone: Not on file    Gets together: Not on file    Attends religious service: Not on file    Active member of club or organization: Not on file    Attends meetings of clubs or organizations: Not on file    Relationship status: Not on file  Other Topics Concern  . Not on file  Social History Narrative   [facility] to [facility].  Lives alone in a 2 story home.  Has 3 daughters.     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     Education: college.    Family History  Problem Relation Age of Onset  . Diabetes Mother        Deceased  . Heart attack Mother   . Dementia Mother   . Hypertension Father        Deceased  . Lung cancer Father   . Cancer Other   . Heart Problems Brother        CABG  . Bipolar disorder Sister   . Healthy Daughter   . Breast cancer Maternal Grandmother     Review of Systems     Objective:  There were no vitals filed for this visit. There were no vitals filed for this visit. There is no height or weight on file to calculate BMI.  Wt Readings  from Last 3 Encounters:  03/02/18 204 lb (92.5 kg)  07/22/17 205 lb (93 kg)  07/21/17 200 lb (90.7 kg)     Physical Exam        Assessment & Plan:         This encounter was created in error - please disregard.

## 2018-04-28 NOTE — Progress Notes (Signed)
Subjective:    Patient ID: Jennifer Sanford, female    DOB: 01/21/1951, 67 y.o.   MRN: [id number]  HPI She is here for a physical exam.   Her abdominal pain resolved.   There was no cause found.    She has no concerns and feels good - she just wants her blood work checked.    Medications and allergies reviewed with patient and updated if appropriate.  Patient Active Problem List   Diagnosis Date Noted  . Hypertriglyceridemia 03/10/2018  . Abdominal distension (gaseous) 03/02/2018  . Right upper quadrant pain 03/02/2018  . Renal insufficiency 07/22/2017  . Essential hypertension, benign 01/29/2016  . Memory difficulties 01/29/2016  . Anxiety 01/29/2016  . Depression 01/29/2016  . GERD (gastroesophageal reflux disease) 01/29/2016  . 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 S/P ureteral reimplantation 09/13/2014  . S/P bilateral unicompartmental knee replacement 03/22/2013  . OA (osteoarthritis) of knee 03/17/2013  . TENOSYNOVITIS OF FOOT AND ANKLE 08/28/2009  . PES PLANUS 08/28/2009    Current Outpatient Medications on File Prior to Visit  Medication Sig Dispense Refill  . B Complex Vitamins (B COMPLEX 50) TABS daily  0  . Cholecalciferol (VITAMIN D3) 1000 units CAPS daily 60 capsule   . citalopram (CELEXA) 20 MG tablet Take 1 tablet (20 mg total) by mouth every morning. 90 tablet 3  . fenofibrate 160 MG tablet Take 1 tablet (160 mg total) by mouth every morning. 90 tablet 3  . pantoprazole (PROTONIX) 20 MG tablet Take 1 tablet (20 mg total) by mouth daily. 90 tablet 3  . triamterene-hydrochlorothiazide (MAXZIDE-25) 37.5-25 MG tablet Take 0.5 tablets by mouth at bedtime. 45 tablet 3   No current facility-administered medications on file prior to visit.     Past Medical History:  Diagnosis Date  . Complication of anesthesia    VERY CLAUSTROPHIC W/ MASK  . Depression   . GERD (gastroesophageal reflux disease)   . History of ureter repair    right ureteral reimplant for  stricture 09-03-2014  . Hx of transfusion of packed red blood cells   . Hyperlipidemia   . Hypertension    no meds s/p wt loss  . OA (osteoarthritis)   . Postop Transfusion 03/21/2013  . Postoperative wound infection     Past Surgical History:  Procedure Laterality Date  . BREAST REDUCTION SURGERY Bilateral 04-12-2008  . CARDIOVASCULAR STRESS TEST  12-26-2009   Normal nuclear study/  no ischemia/  ef 65%  . CYSTO/  RIGHT RETROGRADE PYELOGRAM/  BALLOON DILATION RIGHT URETER STRICTURE/  STENT PLACEMENT  05-25-2010//   03-14-2009//   01-28-2009  . CYSTOSCOPY WITH STENT PLACEMENT Right 02/18/2014   Procedure: CYSTOSCOPY WITH right STENT PLACEMENT;  Surgeon: Danae ChenMarc H Nesi, MD;  [facility] [place];  Service: Urology;  Laterality: Right;  . EXPLORATORY LAPAROTOMY W/ RIGHT URETER REPAIR  2000  . INCISION AND DRAINAGE ABSCESS N/A 11/28/2014   Procedure: INCISION AND DRAINAGE ABSCESS;  Surgeon: Crist FatBenjamin W Herrick, MD;  [facility] [place];  Service: Urology;  Laterality: N/A;  . INCISION AND DRAINAGE ABSCESS N/A 02/15/2015   Procedure: INCISION AND DRAINAGE OF WOUND;  Surgeon: Crist FatBenjamin W Herrick, MD;  [facility] [place];  Service: Urology;  Laterality: N/A;  . KNEE ARTHROSCOPY  left  2004//   bilateral 1980  . KNEE CLOSED REDUCTION Bilateral 06/02/2013   Procedure: CLOSED MANIPULATION BILATERAL KNEES;  Surgeon: Loanne DrillingFrank V Aluisio, MD;  [facility];  Service: Orthopedics;  Laterality: Bilateral;  . REDUCTION MAMMAPLASTY    . TONSILLECTOMY AND ADENOIDECTOMY  as child  . TOTAL KNEE ARTHROPLASTY Bilateral 03/17/2013   Procedure: TOTAL KNEE BILATERAL;  Surgeon: Loanne Drilling, MD;  [facility];  Service: Orthopedics;  Laterality: Bilateral;  . URETERAL REIMPLANTION Right 09/13/2014   Procedure: OPEN RIGHT URETERAL REIMPLANT;  Surgeon: Crist Fat, MD;  [facility];  Service: Urology;  Laterality: Right;  Marland Kitchen VAGINAL HYSTERECTOMY  2000    w/ right salpingoophorectomy    Social History   Socioeconomic History  . Marital status: Divorced    Spouse name: Not on file  . Number of children: Not on file  . Years of education: Not on file  . Highest education level: Not on file  Occupational History  . Not on file  Social Needs  . Financial resource strain: Not on file  . Food insecurity:    Worry: Not on file    Inability: Not on file  . Transportation needs:    Medical: Not on file    Non-medical: Not on file  Tobacco Use  . Smoking status: Never Smoker  . Smokeless tobacco: Never Used  Substance and Sexual Activity  . Alcohol use: No    Alcohol/week: 0.0 oz  . Drug use: No  . Sexual activity: Not on file  Lifestyle  . Physical activity:    Days per week: Not on file    Minutes per session: Not on file  . Stress: Not on file  Relationships  . Social connections:    Talks on phone: Not on file    Gets together: Not on file    Attends religious service: Not on file    Active member of club or organization: Not on file    Attends meetings of clubs or organizations: Not on file    Relationship status: Not on file  Other Topics Concern  . Not on file  Social History Narrative   [facility] to [facility].  Lives alone in a 2 story home.  Has 3 daughters.     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     Education: college.    Family History  Problem Relation Age of Onset  . Diabetes Mother        Deceased  . Heart attack Mother   . Dementia Mother   . Hypertension Father        Deceased  . Lung cancer Father   . Cancer Other   . Heart Problems Brother        CABG  . Bipolar disorder Sister   . Healthy Daughter   . Breast cancer Maternal Grandmother     Review of Systems  Constitutional: Negative for chills, fatigue and fever.  Eyes: Negative for visual disturbance.  Respiratory: Negative for cough, shortness of breath and wheezing.   Cardiovascular: Positive for leg swelling (at end of day).  Negative for chest pain and palpitations.  Gastrointestinal: Negative for abdominal pain, blood in stool, constipation, diarrhea and nausea.       Gerd controlled  Genitourinary: Negative for dysuria and hematuria.  Musculoskeletal: Positive for arthralgias (knees).  Skin: Negative for color change and rash.  Neurological: Negative for light-headedness and headaches.  Psychiatric/Behavioral: The patient is not nervous/anxious.        Objective:   Vitals:   04/29/18 1525  BP: 124/78  Pulse: 66  Resp: 16  Temp: 98.5 F (36.9 C)  SpO2:  98%   Filed Weights   04/29/18 1525  Weight: 205 lb (93 kg)   Body mass index is 29 kg/m.  Wt Readings from Last 3 Encounters:  04/29/18 205 lb (93 kg)  03/02/18 204 lb (92.5 kg)  07/22/17 205 lb (93 kg)     Physical Exam Constitutional: She appears well-developed and well-nourished. No distress.  HENT:  Head: Normocephalic and atraumatic.  Right Ear: External ear normal. Normal ear canal and TM Left Ear: External ear normal.  Normal ear canal and TM Mouth/Throat: Oropharynx is clear and moist.  Eyes: Conjunctivae and EOM are normal.  Neck: Neck supple. No tracheal deviation present. No thyromegaly present.  No carotid bruit  Cardiovascular: Normal rate, regular rhythm and normal heart sounds.   No murmur heard.  No edema. Pulmonary/Chest: Effort normal and breath sounds normal. No respiratory distress. She has no wheezes. She has no rales.  Breast: deferred to Gyn Abdominal: Soft. She exhibits no distension. There is no tenderness.  Lymphadenopathy: She has no cervical adenopathy.  Skin: Skin is warm and dry. She is not diaphoretic.  Psychiatric: She has a normal mood and affect. Her behavior is normal.        Assessment & Plan:   Physical exam: Screening blood work  ordered Immunizations  Up to date through work, discussed shingrix Colonoscopy  Overdue  - due next year Mammogram   Up to date  Gyn    not up to date - will  schedule Eye exams    Up to date  EKG   Done 06/2017 Exercise [facility] to [facility] regularly Weight encouraged weight loss Skin  Substance abuse  none  See Problem List for Assessment and Plan of chronic medical problems.   FU in one year

## 2018-04-28 NOTE — Patient Instructions (Addendum)
Test(s) ordered today. Your results will be released to MyChart (or called to you) after review, usually within 72hours after test completion. If any changes need to be made, you will be notified at that same time.  All other Health Maintenance issues reviewed.   All recommended immunizations and age-appropriate screenings are up-to-date or discussed.  No immunizations administered today.   Medications reviewed and updated.  No changes recommended at this time.    Please followup in one year   Health Maintenance, Female Adopting a healthy lifestyle and getting preventive care can go a long way to promote health and wellness. Talk with your health care provider about what schedule of regular examinations is right for you. This is a good chance for you to check in with your provider about disease prevention and staying healthy. In between checkups, there are plenty of things you can do on your own. Experts have done a lot of research about which lifestyle changes and preventive measures are most likely to keep you healthy. Ask your health care provider for more information. Weight and diet Eat a healthy diet  Be sure to include plenty of vegetables, fruits, low-fat dairy products, and lean protein.  Do not eat a lot of foods high in solid fats, added sugars, or salt.  Get regular exercise. This is one of the most important things you can do for your health. ? Most adults should exercise for at least 150 minutes each week. The exercise should increase your heart rate and make you sweat (moderate-intensity exercise). ? Most adults should also do strengthening exercises at least twice a week. This is in addition to the moderate-intensity exercise.  Maintain a healthy weight  Body mass index (BMI) is a measurement that can be used to identify possible weight problems. It estimates body fat based on height and weight. Your health care provider can help determine your BMI and help you achieve  or maintain a healthy weight.  For females 20 years of age and older: ? A BMI below 18.5 is considered underweight. ? A BMI of 18.5 to 24.9 is normal. ? A BMI of 25 to 29.9 is considered overweight. ? A BMI of 30 and above is considered obese.  Watch levels of cholesterol and blood lipids  You should start having your blood tested for lipids and cholesterol at 65 years of age, then have this test every 5 years.  You may need to have your cholesterol levels checked more often if: ? Your lipid or cholesterol levels are high. ? You are older than 65 years of age. ? You are at high risk for heart disease.  Cancer screening Lung Cancer  Lung cancer screening is recommended for adults 55-80 years old who are at high risk for lung cancer because of a history of smoking.  A yearly low-dose CT scan of the lungs is recommended for people who: ? Currently smoke. ? Have quit within the past 15 years. ? Have at least a 30-pack-year history of smoking. A pack year is smoking an average of one pack of cigarettes a day for 1 year.  Yearly screening should continue until it has been 15 years since you quit.  Yearly screening should stop if you develop a health problem that would prevent you from having lung cancer treatment.  Breast Cancer  Practice breast self-awareness. This means understanding how your breasts normally appear and feel.  It also means doing regular breast self-exams. Let your health care provider know about any   changes, no matter how small.  If you are in your 20s or 30s, you should have a clinical breast exam (CBE) by a health care provider every 1-3 years as part of a regular health exam.  If you are 40 or older, have a CBE every year. Also consider having a breast X-ray (mammogram) every year.  If you have a family history of breast cancer, talk to your health care provider about genetic screening.  If you are at high risk for breast cancer, talk to your health care  provider about having an MRI and a mammogram every year.  Breast cancer gene (BRCA) assessment is recommended for women who have family members with BRCA-related cancers. BRCA-related cancers include: ? Breast. ? Ovarian. ? Tubal. ? Peritoneal cancers.  Results of the assessment will determine the need for genetic counseling and BRCA1 and BRCA2 testing.  Cervical Cancer Your health care provider may recommend that you be screened regularly for cancer of the pelvic organs (ovaries, uterus, and vagina). This screening involves a pelvic examination, including checking for microscopic changes to the surface of your cervix (Pap test). You may be encouraged to have this screening done every 3 years, beginning at age 21.  For women ages 30-65, health care providers may recommend pelvic exams and Pap testing every 3 years, or they may recommend the Pap and pelvic exam, combined with testing for human papilloma virus (HPV), every 5 years. Some types of HPV increase your risk of cervical cancer. Testing for HPV may also be done on women of any age with unclear Pap test results.  Other health care providers may not recommend any screening for nonpregnant women who are considered low risk for pelvic cancer and who do not have symptoms. Ask your health care provider if a screening pelvic exam is right for you.  If you have had past treatment for cervical cancer or a condition that could lead to cancer, you need Pap tests and screening for cancer for at least 20 years after your treatment. If Pap tests have been discontinued, your risk factors (such as having a new sexual partner) need to be reassessed to determine if screening should resume. Some women have medical problems that increase the chance of getting cervical cancer. In these cases, your health care provider may recommend more frequent screening and Pap tests.  Colorectal Cancer  This type of cancer can be detected and often prevented.  Routine  colorectal cancer screening usually begins at 65 years of age and continues through 65 years of age.  Your health care provider may recommend screening at an earlier age if you have risk factors for colon cancer.  Your health care provider may also recommend using home test kits to check for hidden blood in the stool.  A small camera at the end of a tube can be used to examine your colon directly (sigmoidoscopy or colonoscopy). This is done to check for the earliest forms of colorectal cancer.  Routine screening usually begins at age 50.  Direct examination of the colon should be repeated every 5-10 years through 65 years of age. However, you may need to be screened more often if early forms of precancerous polyps or small growths are found.  Skin Cancer  Check your skin from head to toe regularly.  Tell your health care provider about any new moles or changes in moles, especially if there is a change in a mole's shape or color.  Also tell your health care provider if   you have a mole that is larger than the size of a pencil eraser.  Always use sunscreen. Apply sunscreen liberally and repeatedly throughout the day.  Protect yourself by wearing long sleeves, pants, a wide-brimmed hat, and sunglasses whenever you are outside.  Heart disease, diabetes, and high blood pressure  High blood pressure causes heart disease and increases the risk of stroke. High blood pressure is more likely to develop in: ? People who have blood pressure in the high end of the normal range (130-139/85-89 mm Hg). ? People who are overweight or obese. ? People who are African American.  If you are 55-1 years of age, have your blood pressure checked every 3-5 years. If you are 86 years of age or older, have your blood pressure checked every year. You should have your blood pressure measured twice-once when you are at a hospital or clinic, and once when you are not at a hospital or clinic. Record the average of the  two measurements. To check your blood pressure when you are not at a hospital or clinic, you can use: ? An automated blood pressure machine at a pharmacy. ? A home blood pressure monitor.  If you are between 15 years and 81 years old, ask your health care provider if you should take aspirin to prevent strokes.  Have regular diabetes screenings. This involves taking a blood sample to check your fasting blood sugar level. ? If you are at a normal weight and have a low risk for diabetes, have this test once every three years after 66 years of age. ? If you are overweight and have a high risk for diabetes, consider being tested at a younger age or more often. Preventing infection Hepatitis B  If you have a higher risk for hepatitis B, you should be screened for this virus. You are considered at high risk for hepatitis B if: ? You were born in a country where hepatitis B is common. Ask your health care provider which countries are considered high risk. ? Your parents were born in a high-risk country, and you have not been immunized against hepatitis B (hepatitis B vaccine). ? You have HIV or AIDS. ? You use needles to inject street drugs. ? You live with someone who has hepatitis B. ? You have had sex with someone who has hepatitis B. ? You get hemodialysis treatment. ? You take certain medicines for conditions, including cancer, organ transplantation, and autoimmune conditions.  Hepatitis C  Blood testing is recommended for: ? Everyone born from [birth year] through 1963. ? Anyone with known risk factors for hepatitis C.  Sexually transmitted infections (STIs)  You should be screened for sexually transmitted infections (STIs) including gonorrhea and chlamydia if: ? You are sexually active and are younger than 65 years of age. ? You are older than 65 years of age and your health care provider tells you that you are at risk for this type of infection. ? Your sexual activity has changed since you  were last screened and you are at an increased risk for chlamydia or gonorrhea. Ask your health care provider if you are at risk.  If you do not have HIV, but are at risk, it may be recommended that you take a prescription medicine daily to prevent HIV infection. This is called pre-exposure prophylaxis (PrEP). You are considered at risk if: ? You are sexually active and do not regularly use condoms or know the HIV status of your partner(s). ? You take drugs by  injection. ? You are sexually active with a partner who has HIV.  Talk with your health care provider about whether you are at high risk of being infected with HIV. If you choose to begin PrEP, you should first be tested for HIV. You should then be tested every 3 months for as long as you are taking PrEP. Pregnancy  If you are premenopausal and you may become pregnant, ask your health care provider about preconception counseling.  If you may become pregnant, take 400 to 800 micrograms (mcg) of folic acid every day.  If you want to prevent pregnancy, talk to your health care provider about birth control (contraception). Osteoporosis and menopause  Osteoporosis is a disease in which the bones lose minerals and strength with aging. This can result in serious bone fractures. Your risk for osteoporosis can be identified using a bone density scan.  If you are 65 years of age or older, or if you are at risk for osteoporosis and fractures, ask your health care provider if you should be screened.  Ask your health care provider whether you should take a calcium or vitamin D supplement to lower your risk for osteoporosis.  Menopause may have certain physical symptoms and risks.  Hormone replacement therapy may reduce some of these symptoms and risks. Talk to your health care provider about whether hormone replacement therapy is right for you. Follow these instructions at home:  Schedule regular health, dental, and eye exams.  Stay current  with your immunizations.  Do not use any tobacco products including cigarettes, chewing tobacco, or electronic cigarettes.  If you are pregnant, do not drink alcohol.  If you are breastfeeding, limit how much and how often you drink alcohol.  Limit alcohol intake to no more than 1 drink per day for nonpregnant women. One drink equals 12 ounces of beer, 5 ounces of wine, or 1 ounces of hard liquor.  Do not use street drugs.  Do not share needles.  Ask your health care provider for help if you need support or information about quitting drugs.  Tell your health care provider if you often feel depressed.  Tell your health care provider if you have ever been abused or do not feel safe at home. This information is not intended to replace advice given to you by your health care provider. Make sure you discuss any questions you have with your health care provider. Document Released: 03/25/2011 Document Revised: 02/15/2016 Document Reviewed: 06/13/2015 Elsevier Interactive Patient Education  2018 Elsevier Inc.  

## 2018-04-29 ENCOUNTER — Ambulatory Visit (INDEPENDENT_AMBULATORY_CARE_PROVIDER_SITE_OTHER): Payer: 59 | Admitting: Internal Medicine

## 2018-04-29 ENCOUNTER — Other Ambulatory Visit: Payer: 59

## 2018-04-29 ENCOUNTER — Encounter: Payer: Self-pay | Admitting: Internal Medicine

## 2018-04-29 ENCOUNTER — Other Ambulatory Visit (INDEPENDENT_AMBULATORY_CARE_PROVIDER_SITE_OTHER): Payer: 59

## 2018-04-29 VITALS — BP 124/78 | HR 66 | Temp 98.5°F | Resp 16 | Ht 70.5 in | Wt 205.0 lb

## 2018-04-29 DIAGNOSIS — Z0000 Encounter for general adult medical examination without abnormal findings: Secondary | ICD-10-CM

## 2018-04-29 DIAGNOSIS — R7303 Prediabetes: Secondary | ICD-10-CM

## 2018-04-29 DIAGNOSIS — K219 Gastro-esophageal reflux disease without esophagitis: Secondary | ICD-10-CM

## 2018-04-29 DIAGNOSIS — E781 Pure hyperglyceridemia: Secondary | ICD-10-CM

## 2018-04-29 DIAGNOSIS — N289 Disorder of kidney and ureter, unspecified: Secondary | ICD-10-CM

## 2018-04-29 DIAGNOSIS — I10 Essential (primary) hypertension: Secondary | ICD-10-CM | POA: Diagnosis not present

## 2018-04-29 DIAGNOSIS — F3289 Other specified depressive episodes: Secondary | ICD-10-CM | POA: Diagnosis not present

## 2018-04-29 DIAGNOSIS — F419 Anxiety disorder, unspecified: Secondary | ICD-10-CM

## 2018-04-29 LAB — LIPID PANEL
Cholesterol: 202 mg/dL — ABNORMAL HIGH (ref 0–200)
HDL: 60.1 mg/dL (ref >39.00)
LDL Cholesterol: 128 mg/dL — ABNORMAL HIGH (ref 0–99)
NonHDL: 141.41
Total CHOL/HDL Ratio: 3
Triglycerides: 69 mg/dL (ref 0.0–149.0)
VLDL: 13.8 mg/dL (ref 0.0–40.0)

## 2018-04-29 LAB — CBC WITH DIFFERENTIAL/PLATELET
Basophils Absolute: 0 10*3/uL (ref 0.0–0.1)
Basophils Relative: 0.3 % (ref 0.0–3.0)
Eosinophils Absolute: 0.1 10*3/uL (ref 0.0–0.7)
Eosinophils Relative: 1.5 % (ref 0.0–5.0)
HCT: 36.5 % (ref 36.0–46.0)
Hemoglobin: 12.3 g/dL (ref 12.0–15.0)
Lymphocytes Relative: 54.7 % — ABNORMAL HIGH (ref 12.0–46.0)
Lymphs Abs: 4 10*3/uL (ref 0.7–4.0)
MCHC: 33.6 g/dL (ref 30.0–36.0)
MCV: 88.7 fl (ref 78.0–100.0)
Monocytes Absolute: 0.7 10*3/uL (ref 0.1–1.0)
Monocytes Relative: 9.1 % (ref 3.0–12.0)
Neutro Abs: 2.5 10*3/uL (ref 1.4–7.7)
Neutrophils Relative %: 34.4 % — ABNORMAL LOW (ref 43.0–77.0)
Platelets: 290 10*3/uL (ref 150.0–400.0)
RBC: 4.12 Mil/uL (ref 3.87–5.11)
RDW: 13.8 % (ref 11.5–15.5)
WBC: 7.4 10*3/uL (ref 4.0–10.5)

## 2018-04-29 LAB — COMPREHENSIVE METABOLIC PANEL
ALT: 13 U/L (ref 0–35)
AST: 17 U/L (ref 0–37)
Albumin: 4.1 g/dL (ref 3.5–5.2)
Alkaline Phosphatase: 67 U/L (ref 39–117)
BUN: 25 mg/dL — ABNORMAL HIGH (ref 6–23)
CO2: 32 mEq/L (ref 19–32)
Calcium: 10 mg/dL (ref 8.4–10.5)
Chloride: 103 mEq/L (ref 96–112)
Creatinine, Ser: 1.11 mg/dL (ref 0.40–1.20)
GFR: 63.48 mL/min (ref >60.00)
Glucose, Bld: 81 mg/dL (ref 70–99)
Potassium: 4.3 mEq/L (ref 3.5–5.1)
Sodium: 139 mEq/L (ref 135–145)
Total Bilirubin: 0.4 mg/dL (ref 0.2–1.2)
Total Protein: 7.2 g/dL (ref 6.0–8.3)

## 2018-04-29 LAB — TSH: TSH: 0.84 u[IU]/mL (ref 0.35–4.50)

## 2018-04-29 LAB — HEMOGLOBIN A1C: Hgb A1c MFr Bld: 5.9 % (ref 4.6–6.5)

## 2018-04-29 MED ORDER — ZOSTER VAC RECOMB ADJUVANTED 50 MCG/0.5ML IM SUSR: 0.5000 mL | Freq: Once | INTRAMUSCULAR | 1 refills | Status: AC

## 2018-04-29 NOTE — Assessment & Plan Note (Signed)
cmp

## 2018-04-29 NOTE — Assessment & Plan Note (Signed)
BP well controlled Current regimen effective and well tolerated Continue current medications at current doses cmp  

## 2018-04-29 NOTE — Assessment & Plan Note (Signed)
GERD controlled Continue daily medication  

## 2018-04-29 NOTE — Assessment & Plan Note (Signed)
Controlled, stable Continue current dose of medication  

## 2018-04-29 NOTE — Assessment & Plan Note (Signed)
Check a1c Low sugar / carb diet Stressed regular exercise, weight loss  

## 2018-04-29 NOTE — Assessment & Plan Note (Addendum)
Check lipid panel, tsh, cmp  Continue daily fenofibrate Regular exercise and healthy diet encouraged  

## 2018-04-30 ENCOUNTER — Encounter: Payer: Self-pay | Admitting: Internal Medicine

## 2018-05-26 ENCOUNTER — Other Ambulatory Visit: Payer: Self-pay | Admitting: Internal Medicine

## 2018-05-26 MED FILL — TRIAMTERENE/HCTZ 37.5/25 TB: 37.5-25 | 90 days supply | Qty: 45 | Fill #0

## 2018-05-26 MED FILL — PANTOPRAZOLE SOD DR 20 MG T: 20 | 90 days supply | Qty: 90 | Fill #0

## 2018-05-26 MED FILL — FENOFIBRATE 160 MG TABLET: 160 | 90 days supply | Qty: 90 | Fill #0

## 2018-05-26 MED FILL — CITALOPRAM HBR 20 MG TABLET: 20 | 90 days supply | Qty: 90 | Fill #0

## 2018-07-28 ENCOUNTER — Telehealth: Payer: Self-pay | Admitting: Internal Medicine

## 2018-07-28 DIAGNOSIS — M62830 Muscle spasm of back: Secondary | ICD-10-CM | POA: Diagnosis not present

## 2018-07-28 NOTE — Telephone Encounter (Signed)
Pt aware.

## 2018-07-28 NOTE — Telephone Encounter (Signed)
Copied from CRM [id number]. Topic: Quick Communication - See Telephone Encounter >> Jul 28, 2018  3:42 PM Lorrine Kin, [place] wrote: CRM for notification. See Telephone encounter for: 07/28/18. Patient calling and states that she is having left lower hip and left lower back pain that started at 4am this morning. States that she had to get on a flight and go to [place], [place] and cannot come in for an appointment until Monday, scheduled for Tuesday morning). States that she can hardly walk because it hurts. Would like to know if something could be sent to a pharmacy?  [facility] in [place], [place] Telephone [phone]

## 2018-07-28 NOTE — Telephone Encounter (Signed)
She needs to be examined in order to know what medication is appropriate.  I would recommend being seen at an urgent care

## 2018-08-02 NOTE — Progress Notes (Signed)
Subjective:    Patient ID: Jennifer Sanford, female    DOB: 1951/08/14, 67 y.o.   MRN: [id number]  HPI The patient is here for an acute visit.  Left hip pain and left lower back pain:  She went to urgent care on 11/5 when she was out of town for acute left lower back pain radiating down her left leg to her knee.   She was ok with sitting, but with any movement she had pain.  She denied any numbness/tingling or weakness in the leg.  She denies any injury or accident that could have caused the pain.  She felt fine the day prior and exercised normally.  Next morning she woke up with pain and had to fly out of town that day.  She was diagnosed with lumbar paraspinal muscle spasm.  She was prescribed baclofen and meloxicam.  She took the medication for three days and her symptoms significantly improved.  She also applied heat.  She still has mild pain with certain movements.  She only feels the pain in the left lower back at this point, not down the left leg.  Medications and allergies reviewed with patient and updated if appropriate.  Patient Active Problem List   Diagnosis Date Noted  . Prediabetes 04/29/2018  . Hypertriglyceridemia 03/10/2018  . Abdominal distension (gaseous) 03/02/2018  . Right upper quadrant pain 03/02/2018  . Renal insufficiency 07/22/2017  . Essential hypertension, benign 01/29/2016  . Memory difficulties 01/29/2016  . Anxiety 01/29/2016  . Depression 01/29/2016  . GERD (gastroesophageal reflux disease) 01/29/2016  . 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 S/P ureteral reimplantation 09/13/2014  . S/P bilateral unicompartmental knee replacement 03/22/2013  . OA (osteoarthritis) of knee 03/17/2013  . PES PLANUS 08/28/2009    Current Outpatient Medications on File Prior to Visit  Medication Sig Dispense Refill  . B Complex Vitamins (B COMPLEX 50) TABS daily  0  . Cholecalciferol (VITAMIN D3) 1000 units CAPS daily 60 capsule   . citalopram (CELEXA) 20 MG tablet  TAKE 1 TABLET BY MOUTH EVERY MORNING. 90 tablet 3  . fenofibrate 160 MG tablet TAKE 1 TABLET BY MOUTH EVERY MORNING. 90 tablet 3  . pantoprazole (PROTONIX) 20 MG tablet TAKE 1 TABLET BY MOUTH DAILY. 90 tablet 3  . triamterene-hydrochlorothiazide (MAXZIDE-25) 37.5-25 MG tablet TAKE 1/2 TABLET BY MOUTH AT BEDTIME. 45 tablet 3   No current facility-administered medications on file prior to visit.     Past Medical History:  Diagnosis Date  . Complication of anesthesia    VERY CLAUSTROPHIC W/ MASK  . Depression   . GERD (gastroesophageal reflux disease)   . 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 Hx of transfusion of packed red blood cells   . Hyperlipidemia   . Hypertension    no meds s/p wt loss  . OA (osteoarthritis)   . Postop Transfusion 03/21/2013  . Postoperative wound infection     Past Surgical History:  Procedure Laterality Date  . BREAST REDUCTION SURGERY Bilateral 04-12-2008  . CARDIOVASCULAR STRESS TEST  12-26-2009   Normal nuclear study/  no ischemia/  ef 65%  . CYSTO/  RIGHT RETROGRADE PYELOGRAM/  BALLOON DILATION RIGHT URETER STRICTURE/  STENT PLACEMENT  05-25-2010//   03-14-2009//   01-28-2009  . CYSTOSCOPY WITH STENT PLACEMENT Right 02/18/2014   Procedure: CYSTOSCOPY WITH right STENT PLACEMENT;  Surgeon: Danae Chen, MD;  Location: [facility];  Service: Urology;  Laterality: Right;  . EXPLORATORY LAPAROTOMY W/ RIGHT URETER REPAIR  2000  . INCISION AND DRAINAGE ABSCESS N/A 11/28/2014   Procedure: INCISION AND DRAINAGE ABSCESS;  Surgeon: Crist Fat, MD;  Location: [facility];  Service: Urology;  Laterality: N/A;  . INCISION AND DRAINAGE ABSCESS N/A 02/15/2015   Procedure: INCISION AND DRAINAGE OF WOUND;  Surgeon: Crist Fat, MD;  Location[facility];  Service: Urology;  Laterality: N/A;  . KNEE ARTHROSCOPY  left  2004//   bilateral 1980  . KNEE CLOSED REDUCTION  Bilateral 06/02/2013   Procedure: CLOSED MANIPULATION BILATERAL KNEES;  Surgeon: Loanne Drilling, MD;  Location: [facility];  Service: Orthopedics;  Laterality: Bilateral;  . REDUCTION MAMMAPLASTY    . TONSILLECTOMY AND ADENOIDECTOMY  as child  . TOTAL KNEE ARTHROPLASTY Bilateral 03/17/2013   Procedure: TOTAL KNEE BILATERAL;  Surgeon: Loanne Drilling, MD;  Location: [facility];  Service: Orthopedics;  Laterality: Bilateral;  . URETERAL REIMPLANTION Right 09/13/2014   Procedure: OPEN RIGHT URETERAL REIMPLANT;  Surgeon: Crist Fat, MD;  Location: [facility];  Service: Urology;  Laterality: Right;  Marland Kitchen VAGINAL HYSTERECTOMY  2000   w/ right salpingoophorectomy    Social History   Socioeconomic History  . Marital status: Divorced    Spouse name: Not on file  . Number of children: Not on file  . Years of education: Not on file  . Highest education level: Not on file  Occupational History  . Not on file  Social Needs  . Financial resource strain: Not on file  . Food insecurity:    Worry: Not on file    Inability: Not on file  . Transportation needs:    Medical: Not on file    Non-medical: Not on file  Tobacco Use  . Smoking status: Never Smoker  . Smokeless tobacco: Never Used  Substance and Sexual Activity  . Alcohol use: No    Alcohol/week: 0.0 standard drinks  . Drug use: No  . Sexual activity: Not on file  Lifestyle  . Physical activity:    Days per week: Not on file    Minutes per session: Not on file  . Stress: Not on file  Relationships  . Social connections:    Talks on phone: Not on file    Gets together: Not on file    Attends religious service: Not on file    Active member of club or organization: Not on file    Attends meetings of clubs or organizations: Not on file    Relationship status: Not on file  Other Topics Concern  . Not on file  Social History Narrative   [place].  Lives alone in a 2 story home.  Has 3 daughters.     Work as a Estate agent at [employer].     Education: college.    Family History  Problem Relation Age of Onset  . Diabetes Mother        Deceased  . Heart attack Mother   . Dementia Mother   . Hypertension Father        Deceased  . Lung cancer Father   . Cancer Other   . Heart Problems Brother        CABG  . Bipolar disorder Sister   . Healthy Daughter   . Breast cancer Maternal Grandmother     Review of Systems  Musculoskeletal: Positive for back pain.  Neurological: Negative  for weakness and numbness.       Objective:   Vitals:   08/04/18 0908  BP: 118/70  Pulse: (!) 57  Resp: 16  Temp: 98.1 F (36.7 C)  SpO2: 99%   BP Readings from Last 3 Encounters:  08/04/18 118/70  04/29/18 124/78  03/02/18 110/62   Wt Readings from Last 3 Encounters:  08/04/18 212 lb 12.8 oz (96.5 kg)  04/29/18 205 lb (93 kg)  03/02/18 204 lb (92.5 kg)   Body mass index is 30.1 kg/m.   Physical Exam  Constitutional: She appears well-developed and well-nourished. No distress.  HENT:  Head: Normocephalic and atraumatic.  Musculoskeletal: She exhibits edema (Trace bilateral ankle nonpitting edema). She exhibits no tenderness (No tenderness lumbar spine, left SI joint or left lower back with palpation) or deformity (No lumbar spine deformity).  Neurological: No sensory deficit.  Skin: Skin is warm and dry. She is not diaphoretic.           Assessment & Plan:    See Problem List for Assessment and Plan of chronic medical problems.

## 2018-08-04 ENCOUNTER — Encounter: Payer: Self-pay | Admitting: Internal Medicine

## 2018-08-04 ENCOUNTER — Ambulatory Visit: Payer: 59 | Admitting: Internal Medicine

## 2018-08-04 VITALS — BP 118/70 | HR 57 | Temp 98.1°F | Resp 16 | Ht 70.5 in | Wt 212.8 lb

## 2018-08-04 DIAGNOSIS — M62830 Muscle spasm of back: Secondary | ICD-10-CM | POA: Diagnosis not present

## 2018-08-04 DIAGNOSIS — Z23 Encounter for immunization: Secondary | ICD-10-CM

## 2018-08-04 MED ORDER — BACLOFEN 10 MG PO TABS: 10.0000 mg | ORAL_TABLET | Freq: Three times a day (TID) | ORAL | 0 refills | Status: DC | PRN

## 2018-08-04 MED ORDER — MELOXICAM 7.5 MG PO TABS: 7.5000 mg | ORAL_TABLET | Freq: Every day | ORAL | 1 refills | Status: DC

## 2018-08-04 MED FILL — MELOXICAM 7.5 MG TABLET: 7.5 | 30 days supply | Qty: 30 | Fill #0

## 2018-08-04 MED FILL — BACLOFEN 10 MG TABLET: 10 | 20 days supply | Qty: 60 | Fill #0

## 2018-08-04 NOTE — Assessment & Plan Note (Signed)
Left lumbar paraspinal muscle spasm resulting in some radiculopathy down the left leg.  Radiculopathy has resolved and she is now having mild left lower back pain Symptoms improved with baclofen 10 mg and meloxicam He also improved symptoms We will continue the above until her symptoms are completely gone Avoid bending, lifting and twisting Baclofen and meloxicam renewed Call if no improvement

## 2018-08-04 NOTE — Patient Instructions (Addendum)
  pneumonia and shingles immunizations administered today.   Medications reviewed and updated.  Changes include :   Baclofen and meloxicam as needed    Your prescription(s) have been submitted to your pharmacy. Please take as directed and contact our office if you believe you are having problem(s) with the medication(s).   Call if no improvement

## 2018-09-10 MED FILL — TRIAMTERENE/HCTZ 37.5/25 TB: 37.5-25 | 90 days supply | Qty: 45 | Fill #1

## 2018-09-10 MED FILL — PANTOPRAZOLE SOD DR 20 MG T: 20 | 90 days supply | Qty: 90 | Fill #1

## 2018-09-10 MED FILL — FENOFIBRATE 160 MG TABLET: 160 | 90 days supply | Qty: 90 | Fill #1

## 2018-09-10 MED FILL — CITALOPRAM HBR 20 MG TABLET: 20 | 90 days supply | Qty: 90 | Fill #1

## 2018-10-05 ENCOUNTER — Telehealth: Payer: 59 | Admitting: Family

## 2018-10-05 DIAGNOSIS — R059 Cough, unspecified: Secondary | ICD-10-CM

## 2018-10-05 DIAGNOSIS — R05 Cough: Secondary | ICD-10-CM

## 2018-10-05 MED ORDER — PREDNISONE 10 MG (21) PO TBPK: ORAL_TABLET | ORAL | 0 refills | Status: DC

## 2018-10-05 MED ORDER — BENZONATATE 100 MG PO CAPS: 100.0000 mg | ORAL_CAPSULE | Freq: Three times a day (TID) | ORAL | 0 refills | Status: DC | PRN

## 2018-10-05 MED ORDER — AZITHROMYCIN 250 MG PO TABS: ORAL_TABLET | ORAL | 0 refills | Status: DC

## 2018-10-05 NOTE — Progress Notes (Signed)
We are sorry that you are not feeling well.  Here is how we plan to help!  Based on your presentation I believe you most likely have A cough due to bacteria.  When patients have a fever and a productive cough with a change in color or increased sputum production, we are concerned about bacterial bronchitis.  If left untreated it can progress to pneumonia.  If your symptoms do not improve with your treatment plan it is important that you contact your provider.   I have prescribed Azithromyin 250 mg: two tablets now and then one tablet daily for 4 additonal days    In addition you may use A non-prescription cough medication called Robitussin DAC. Take 2 teaspoons every 8 hours or Delsym: take 2 teaspoons every 12 hours., A non-prescription cough medication called Mucinex DM: take 2 tablets every 12 hours. and A prescription cough medication called Tessalon Perles 100mg . You may take 1-2 capsules every 8 hours as needed for your cough.   Given fatigue, sweating at night,  coughing for 6 days or more, and age we will treat with antibiotics.   Prednisone 10 mg daily for 6 days (see taper instructions below)  Directions for 6 day taper: Day 1: 2 tablets before breakfast, 1 after both lunch & dinner and 2 at bedtime Day 2: 1 tab before breakfast, 1 after both lunch & dinner and 2 at bedtime Day 3: 1 tab at each meal & 1 at bedtime Day 4: 1 tab at breakfast, 1 at lunch, 1 at bedtime Day 5: 1 tab at breakfast & 1 tab at bedtime Day 6: 1 tab at breakfast   From your responses in the eVisit questionnaire you describe inflammation in the upper respiratory tract which is causing a significant cough.  This is commonly called Bronchitis and has four common causes:    Allergies  Viral Infections  Acid Reflux  Bacterial Infection Allergies, viruses and acid reflux are treated by controlling symptoms or eliminating the cause. An example might be a cough caused by taking certain blood pressure medications.  You stop the cough by changing the medication. Another example might be a cough caused by acid reflux. Controlling the reflux helps control the cough.  USE OF BRONCHODILATOR ("RESCUE") INHALERS: There is a risk from using your bronchodilator too frequently.  The risk is that over-reliance on a medication which only relaxes the muscles surrounding the breathing tubes can reduce the effectiveness of medications prescribed to reduce swelling and congestion of the tubes themselves.  Although you feel brief relief from the bronchodilator inhaler, your asthma may actually be worsening with the tubes becoming more swollen and filled with mucus.  This can delay other crucial treatments, such as oral steroid medications. If you need to use a bronchodilator inhaler daily, several times per day, you should discuss this with your provider.  There are probably better treatments that could be used to keep your asthma under control.     HOME CARE . Only take medications as instructed by your medical team. . Complete the entire course of an antibiotic. . Drink plenty of fluids and get plenty of rest. . Avoid close contacts especially the very young and the elderly . Cover your mouth if you cough or cough into your sleeve. . Always remember to wash your hands . A steam or ultrasonic humidifier can help congestion.   GET HELP RIGHT AWAY IF: . You develop worsening fever. . You become short of breath . You cough  up blood. . Your symptoms persist after you have completed your treatment plan MAKE SURE YOU   Understand these instructions.  Will watch your condition.  Will get help right away if you are not doing well or get worse.  Your e-visit answers were reviewed by a board certified advanced clinical practitioner to complete your personal care plan.  Depending on the condition, your plan could have included both over the counter or prescription medications. If there is a problem please reply  once you have  received a response from your provider. Your safety is important to us.  If you have drug allergies check your prescription carefully.    You can use MyChart to ask questions about today's visit, request a non-urgent call back, or ask for a work or school excuse for 24 hours related to this e-Visit. If it has been greater than 24 hours you will need to follow up with your provider, or enter a new e-Visit to address those concerns. You will get an e-mail in the next two days asking about your experience.  I hope that your e-visit has been valuable and will speed your recovery. Thank you for using e-visits.

## 2018-10-06 ENCOUNTER — Telehealth: Payer: Self-pay | Admitting: Internal Medicine

## 2018-10-06 MED ORDER — HYDROCOD POLST-CPM POLST ER 10-8 MG/5ML PO SUER: 5.0000 mL | Freq: Two times a day (BID) | ORAL | 0 refills | Status: DC | PRN

## 2018-10-06 MED FILL — AZITHROMYCIN 250 MG TABLET: 250 | 5 days supply | Qty: 6 | Fill #0

## 2018-10-06 MED FILL — predniSONE 10 MG TABS: 10 | 6 days supply | Qty: 21 | Fill #0

## 2018-10-06 MED FILL — HYDROCODONE-CHLORPHEN ER SU: 10-8 | 10 days supply | Qty: 100 | Fill #0

## 2018-10-06 NOTE — Telephone Encounter (Signed)
tussionex sent to [facility]

## 2018-10-06 NOTE — Telephone Encounter (Signed)
Pt is aware.  

## 2018-10-06 NOTE — Telephone Encounter (Signed)
Copied from CRM [id number]. Topic: Quick Communication - See Telephone Encounter >> Oct 06, 2018  9:58 AM Windy Kalata, NT wrote: CRM for notification. See Telephone encounter for: 10/06/18.  Patient is calling and states she did a Evisit and was prescribed benzonatate (TESSALON PERLES) 100 MG capsule but she states those have not helped her in the past and she is wanting to know if Tussionex could be called in place of that.  [facility] - [place], [place] - [address] [facility] [place] [address] Phone: [phone] Fax: [phone]

## 2018-10-07 NOTE — Telephone Encounter (Signed)
Are you ok with writing a note for work since you have not physically seen patient?

## 2018-10-07 NOTE — Telephone Encounter (Signed)
Letter written. Pt is aware and will print it through my chart.

## 2018-10-07 NOTE — Telephone Encounter (Addendum)
Yes ok to write

## 2018-10-07 NOTE — Telephone Encounter (Signed)
Patient is calling to request a call back in regards to a doctors note to return to work. She stated the note will need to cover the days  She was out of work. Her best contact number is [phone]

## 2018-10-09 ENCOUNTER — Ambulatory Visit: Payer: Self-pay

## 2018-10-09 NOTE — Telephone Encounter (Signed)
Pt called to say she has been treated for bronchitis Dx at an E visit She is presently taking prednisone and Azithromycin.  She has tussinex for cough that she takes only at night. Her cough is mild.  She coughs up small amounts of yellow mucus. She is calling to say she has developed dizziness.  She describes her symptoms as the room going a different way than she. She is drinking fluids but states her head is just full. Appointment scheduled per protocol.  Care advice read to patient. Pt verbalized understanding of all instructions.  Reason for Disposition . Cough . [1] MODERATE dizziness (e.g., vertigo; feels very unsteady, interferes with normal activities) AND [2] has NOT been evaluated by physician for this  Answer Assessment - Initial Assessment Questions 1. ONSET: "When did the cough begin?"      Last week 2. SEVERITY: "How bad is the cough today?"     mild 3. RESPIRATORY DISTRESS: "Describe your breathing."      Breathing ok 4. FEVER: "Do you have a fever?" If so, ask: "What is your temperature, how was it measured, and when did it start?"     no 5. SPUTUM: "Describe the color of your sputum" (clear, white, yellow, green)     Sometimes yellow 6. HEMOPTYSIS: "Are you coughing up any blood?" If so ask: "How much?" (flecks, streaks, tablespoons, etc.)     no 7. CARDIAC HISTORY: "Do you have any history of heart disease?" (e.g., heart attack, congestive heart failure)      no 8. LUNG HISTORY: "Do you have any history of lung disease?"  (e.g., pulmonary embolus, asthma, emphysema)     no 9. PE RISK FACTORS: "Do you have a history of blood clots?" (or: recent major surgery, recent prolonged travel, bedridden)     no 10. OTHER SYMPTOMS: "Do you have any other symptoms?" (e.g., runny nose, wheezing, chest pain)       Congested  11. PREGNANCY: "Is there any chance you are pregnant?" "When was your last menstrual period?"       No 12. TRAVEL: "Have you traveled out of the country in the  last month?" (e.g., travel history, exposures)       no  Answer Assessment - Initial Assessment Questions 1. DESCRIPTION: "Describe your dizziness."     Vertigo room going a different way than she is 2. VERTIGO: "Do you feel like either you or the room is spinning or tilting?"    tilting 3. LIGHTHEADED: "Do you feel lightheaded?" (e.g., somewhat faint, woozy, weak upon standing)     no 4. SEVERITY: "How bad is it?"  "Can you walk?"   - MILD - Feels unsteady but walking normally.   - MODERATE - Feels very unsteady when walking, but not falling; interferes with normal activities (e.g., school, work) .   - SEVERE - Unable to walk without falling (requires assistance).     moderate 5. ONSET:  "When did the dizziness begin?"     This AM 6. AGGRAVATING FACTORS: "Does anything make it worse?" (e.g., standing, change in head position)     staqnding 7. CAUSE: "What do you think is causing the dizziness?"     Congested in sinuses 8. RECURRENT SYMPTOM: "Have you had dizziness before?" If so, ask: "When was the last time?" "What happened that time?"     No Vertigo years ago 9. OTHER SYMPTOMS: "Do you have any other symptoms?" (e.g., headache, weakness, numbness, vomiting, earache)     headache 10.  PREGNANCY: "Is there any chance you are pregnant?" "When was your last menstrual period?"       n/a  Protocols used: COUGH - ACUTE PRODUCTIVE-A-AH, DIZZINESS - VERTIGO-A-AH

## 2018-10-09 NOTE — Progress Notes (Signed)
Chief Complaint  Patient presents with  . Nasal Congestion    since last week  . Dizziness    HPI: Jennifer Sanford 68 y.o. come in for  Sat clinic   Send in my nurse triage from day before   After  E visit rx for "bronchitis "ith z pack and prednisone  And then evekped dizziness  Yesterday with movement  Just wants to make sure nothing else to do .  Works [employer] and many on her service have had the flu  last week.     Was sick for 5 days  Into ill[facility]  When did e viist   Was given z pack prednisone taper and tussionex.   jsut fininshed the azithro and last day of pred   No fever.  No sob.   Pressure had and hoarse . No wheezing sob  Prednisone   Only taking  tussinex at night .   Dizzy when ,moving up and down and had headache and chest still ur congested  .  ROS: See pertinent positives and negatives per HPI. No weakness numbness vertigo ear pain   Past Medical History:  Diagnosis Date  . Complication of anesthesia    VERY CLAUSTROPHIC W/ MASK  . Depression   . GERD (gastroesophageal reflux disease)   . 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 Hx of transfusion of packed red blood cells   . Hyperlipidemia   . Hypertension    no meds s/p wt loss  . OA (osteoarthritis)   . Postop Transfusion 03/21/2013  . Postoperative wound infection     Family History  Problem Relation Age of Onset  . Diabetes Mother        Deceased  . Heart attack Mother   . Dementia Mother   . Hypertension Father        Deceased  . Lung cancer Father   . Cancer Other   . Heart Problems Brother        CABG  . Bipolar disorder Sister   . Healthy Daughter   . Breast cancer Maternal Grandmother     Social History   Socioeconomic History  . Marital status: Divorced    Spouse name: Not on file  . Number of children: Not on file  . Years of education: Not on file  . Highest education level: Not on file  Occupational History  . Not on file  Social Needs    . Financial resource strain: Not on file  . Food insecurity:    Worry: Not on file    Inability: Not on file  . Transportation needs:    Medical: Not on file    Non-medical: Not on file  Tobacco Use  . Smoking status: Never Smoker  . Smokeless tobacco: Never Used  Substance and Sexual Activity  . Alcohol use: No    Alcohol/week: 0.0 standard drinks  . Drug use: No  . Sexual activity: Not on file  Lifestyle  . Physical activity:    Days per week: Not on file    Minutes per session: Not on file  . Stress: Not on file  Relationships  . Social connections:    Talks on phone: Not on file    Gets together: Not on file    Attends religious service: Not on file    Active member of club or organization: Not on file    Attends meetings of clubs or organizations: Not  on file    Relationship status: Not on file  Other Topics Concern  . Not on file  Social History Narrative   [place].  Lives alone in a 2 story home.  Has 3 daughters.     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     Education: college.    Outpatient Medications Prior to Visit  Medication Sig Dispense Refill  . B Complex Vitamins (B COMPLEX 50) TABS daily  0  . baclofen (LIORESAL) 10 MG tablet Take 1 tablet (10 mg total) by mouth 3 (three) times daily as needed. for pain 60 tablet 0  . chlorpheniramine-HYDROcodone (TUSSIONEX PENNKINETIC ER) 10-8 MG/5ML SUER Take 5 mLs by mouth every 12 (twelve) hours as needed for cough. 100 mL 0  . Cholecalciferol (VITAMIN D3) 1000 units CAPS daily 60 capsule   . citalopram (CELEXA) 20 MG tablet TAKE 1 TABLET BY MOUTH EVERY MORNING. 90 tablet 3  . fenofibrate 160 MG tablet TAKE 1 TABLET BY MOUTH EVERY MORNING. 90 tablet 3  . meloxicam (MOBIC) 7.5 MG tablet Take 1 tablet (7.5 mg total) by mouth daily. 30 tablet 1  . pantoprazole (PROTONIX) 20 MG tablet TAKE 1 TABLET BY MOUTH DAILY. 90 tablet 3  . predniSONE (STERAPRED UNI-PAK 21 TAB) 10 MG (21) TBPK tablet Use as  directed 21 tablet 0  . triamterene-hydrochlorothiazide (MAXZIDE-25) 37.5-25 MG tablet TAKE 1/2 TABLET BY MOUTH AT BEDTIME. 45 tablet 3  . azithromycin (ZITHROMAX) 250 MG tablet Take 500 mg once, then 250 mg for four days (Patient not taking: Reported on 10/10/2018) 6 tablet 0  . benzonatate (TESSALON PERLES) 100 MG capsule Take 1 capsule (100 mg total) by mouth 3 (three) times daily as needed. 20 capsule 0   No facility-administered medications prior to visit.      EXAM:  BP 122/74 (BP Location: Left Arm, Patient Position: Sitting, Cuff Size: Large)   Pulse 65   Temp 98.3 F (36.8 C) (Oral)   Ht 5' 10.5" (1.791 m)   Wt 210 lb (95.3 kg)   SpO2 98%   BMI 29.71 kg/m   Body mass index is 29.71 kg/m. WDWN in NAD  quiet respirations; mildly congested  somewhat hoarse. Non toxic . Cough at times bronchial  HEENT: Normocephalic ;atraumatic , Eyes;  PERRL, EOMs  Full, lids and conjunctiva clear,,Ears: no deformities, canals nl, TM landmarks normal, Nose: no deformity or discharge but congested;face minimally tender Mouth : OP clear without lesion or edema . Neck: Supple without adenopathy or masses or bruits Chest:  Clear to A without wheezes rales or rhonchi CV:  S1-S2 no gallops or murmurs peripheral perfusion is normal Skin :nl perfusion and no acute rashes  PSYCH: pleasant and cooperative, no obvious depression or anxiety Lab Results  Component Value Date   WBC 7.4 04/29/2018   HGB 12.3 04/29/2018   HCT 36.5 04/29/2018   PLT 290.0 04/29/2018   GLUCOSE 81 04/29/2018   CHOL 202 (H) 04/29/2018   TRIG 69.0 04/29/2018   HDL 60.10 04/29/2018   LDLCALC 128 (H) 04/29/2018   ALT 13 04/29/2018   AST 17 04/29/2018   NA 139 04/29/2018   K 4.3 04/29/2018   CL 103 04/29/2018   CREATININE 1.11 04/29/2018   BUN 25 (H) 04/29/2018   CO2 32 04/29/2018   TSH 0.84 04/29/2018   INR 2.44 (H) 04/02/2013   HGBA1C 5.9 04/29/2018   BP Readings from Last 3 Encounters:  10/10/18 122/74  08/04/18  118/70  04/29/18 124/78    ASSESSMENT AND PLAN:  Discussed the following assessment and plan:  Acute respiratory infection  Dizziness yesterday  - see text  Viral resp infection  With congestion and  Currently dizzy sx poss from congsetion and poss meds  But exam is reassuring and non focal  Suspect flu like  Just finished  z pack pred and cough med given by e visit  Without exam     Counseled.  Risk benefit of medication discussed. Rest over weekend note for work  -Patient advised to return or notify health care team  if  new concerns arise.  Patient Instructions  Exam is reassuring even  Though you are not better yet.   Chest is clear   Caution with cold meds have side effects.   Can return Jan 21 Tuesday  If feeling better .     Neta MendsWanda K. Panosh M.D.

## 2018-10-10 ENCOUNTER — Encounter: Payer: Self-pay | Admitting: Internal Medicine

## 2018-10-10 ENCOUNTER — Ambulatory Visit: Payer: 59 | Admitting: Internal Medicine

## 2018-10-10 VITALS — BP 122/74 | HR 65 | Temp 98.3°F | Ht 70.5 in | Wt 210.0 lb

## 2018-10-10 DIAGNOSIS — R42 Dizziness and giddiness: Secondary | ICD-10-CM

## 2018-10-10 DIAGNOSIS — J22 Unspecified acute lower respiratory infection: Secondary | ICD-10-CM

## 2018-10-10 DIAGNOSIS — Z79899 Other long term (current) drug therapy: Secondary | ICD-10-CM

## 2018-10-10 NOTE — Patient Instructions (Signed)
Exam is reassuring even  Though you are not better yet.   Chest is clear   Caution with cold meds have side effects.   Can return Jan 21 Tuesday  If feeling better .

## 2018-10-16 ENCOUNTER — Telehealth: Payer: Self-pay | Admitting: Internal Medicine

## 2018-10-16 NOTE — Telephone Encounter (Signed)
Received FMLA via fax for the time missed while out of work on 01/10 to 01/20 for acute respiratory infection.   Forms have been completed & placed in providers box to review and sign.

## 2018-10-19 DIAGNOSIS — Z0279 Encounter for issue of other medical certificate: Secondary | ICD-10-CM

## 2018-10-19 NOTE — Telephone Encounter (Signed)
Forms have been signed, Faxed to [employer], Copy sent to scan & charged for.   LVM to inform. Originals have been mailed to patient.

## 2018-11-18 DIAGNOSIS — M25511 Pain in right shoulder: Secondary | ICD-10-CM | POA: Diagnosis not present

## 2018-11-18 DIAGNOSIS — M542 Cervicalgia: Secondary | ICD-10-CM | POA: Diagnosis not present

## 2018-12-12 ENCOUNTER — Encounter: Payer: Self-pay | Admitting: Internal Medicine

## 2018-12-12 MED FILL — TRIAMTERENE/HCTZ 37.5/25 TB: 37.5-25 | 90 days supply | Qty: 45 | Fill #2

## 2018-12-12 MED FILL — PANTOPRAZOLE SOD DR 20 MG T: 20 | 90 days supply | Qty: 90 | Fill #2

## 2018-12-12 MED FILL — CITALOPRAM HBR 20 MG TABLET: 20 | 90 days supply | Qty: 90 | Fill #2

## 2018-12-12 MED FILL — FENOFIBRATE 160 MG TABLET: 160 | 90 days supply | Qty: 90 | Fill #2

## 2018-12-30 DIAGNOSIS — N952 Postmenopausal atrophic vaginitis: Secondary | ICD-10-CM | POA: Diagnosis not present

## 2018-12-30 DIAGNOSIS — Z90710 Acquired absence of both cervix and uterus: Secondary | ICD-10-CM | POA: Diagnosis not present

## 2018-12-30 DIAGNOSIS — N941 Unspecified dyspareunia: Secondary | ICD-10-CM | POA: Diagnosis not present

## 2019-01-27 ENCOUNTER — Other Ambulatory Visit: Payer: Self-pay | Admitting: Internal Medicine

## 2019-01-27 DIAGNOSIS — Z1231 Encounter for screening mammogram for malignant neoplasm of breast: Secondary | ICD-10-CM

## 2019-02-02 ENCOUNTER — Other Ambulatory Visit: Payer: Self-pay

## 2019-02-02 ENCOUNTER — Ambulatory Visit
Admission: RE | Admit: 2019-02-02 | Discharge: 2019-02-02 | Disposition: A | Discharge status: Home / Self Care | Payer: 59 | Source: Ambulatory Visit | Attending: Internal Medicine | Admitting: Internal Medicine

## 2019-02-02 DIAGNOSIS — Z1231 Encounter for screening mammogram for malignant neoplasm of breast: Secondary | ICD-10-CM

## 2019-02-02 MED FILL — diazePAM 5 MG TABS: 5 | 2 days supply | Qty: 3 | Fill #0

## 2019-02-02 MED FILL — AMOXICILLIN 500 MG CAPSULE: 500 | 3 days supply | Qty: 12 | Fill #0

## 2019-02-04 ENCOUNTER — Other Ambulatory Visit: Payer: Self-pay | Admitting: Internal Medicine

## 2019-02-04 DIAGNOSIS — R928 Other abnormal and inconclusive findings on diagnostic imaging of breast: Secondary | ICD-10-CM

## 2019-02-05 MED FILL — AMOXICILLIN 500 MG CAPSULE: 500 | 7 days supply | Qty: 21 | Fill #0

## 2019-02-12 ENCOUNTER — Other Ambulatory Visit: Payer: Self-pay | Admitting: Internal Medicine

## 2019-02-12 ENCOUNTER — Ambulatory Visit
Admission: RE | Admit: 2019-02-12 | Discharge: 2019-02-12 | Disposition: A | Discharge status: Home / Self Care | Payer: 59 | Source: Ambulatory Visit | Attending: Internal Medicine | Admitting: Internal Medicine

## 2019-02-12 ENCOUNTER — Other Ambulatory Visit: Payer: Self-pay

## 2019-02-12 DIAGNOSIS — R928 Other abnormal and inconclusive findings on diagnostic imaging of breast: Secondary | ICD-10-CM

## 2019-02-12 DIAGNOSIS — N6320 Unspecified lump in the left breast, unspecified quadrant: Secondary | ICD-10-CM

## 2019-02-16 DIAGNOSIS — Z6830 Body mass index (BMI) 30.0-30.9, adult: Secondary | ICD-10-CM | POA: Diagnosis not present

## 2019-02-16 DIAGNOSIS — Z01419 Encounter for gynecological examination (general) (routine) without abnormal findings: Secondary | ICD-10-CM | POA: Diagnosis not present

## 2019-02-16 MED FILL — INTRAROSA 6.5 MG VAG INSERT: 6.5 | 28 days supply | Qty: 28 | Fill #0

## 2019-02-22 MED FILL — PANTOPRAZOLE SOD DR 20 MG T: 20 | 90 days supply | Qty: 90 | Fill #3

## 2019-02-22 MED FILL — FENOFIBRATE 160 MG TABLET: 160 | 90 days supply | Qty: 90 | Fill #3

## 2019-02-22 MED FILL — CITALOPRAM HBR 20 MG TABLET: 20 | 90 days supply | Qty: 90 | Fill #3

## 2019-02-22 MED FILL — TRIAMTERENE/HCTZ 37.5/25 TB: 37.5-25 | 90 days supply | Qty: 45 | Fill #3

## 2019-03-05 ENCOUNTER — Other Ambulatory Visit: Payer: Self-pay | Admitting: Obstetrics and Gynecology

## 2019-03-05 DIAGNOSIS — R5381 Other malaise: Secondary | ICD-10-CM

## 2019-03-09 ENCOUNTER — Other Ambulatory Visit: Payer: Self-pay | Admitting: Obstetrics and Gynecology

## 2019-03-09 DIAGNOSIS — E2839 Other primary ovarian failure: Secondary | ICD-10-CM

## 2019-03-18 MED FILL — diazePAM 5 MG TABS: 5 | 2 days supply | Qty: 3 | Fill #0

## 2019-03-19 MED FILL — AMOXICILLIN 500 MG CAPSULE: 500 | 7 days supply | Qty: 21 | Fill #0

## 2019-03-31 DIAGNOSIS — H5213 Myopia, bilateral: Secondary | ICD-10-CM | POA: Diagnosis not present

## 2019-04-12 ENCOUNTER — Other Ambulatory Visit: Payer: Self-pay | Admitting: Internal Medicine

## 2019-04-21 ENCOUNTER — Ambulatory Visit
Admission: RE | Admit: 2019-04-21 | Discharge: 2019-04-21 | Disposition: A | Discharge status: Home / Self Care | Payer: 59 | Source: Ambulatory Visit | Attending: Obstetrics and Gynecology | Admitting: Obstetrics and Gynecology

## 2019-04-21 ENCOUNTER — Other Ambulatory Visit: Payer: Self-pay

## 2019-04-21 DIAGNOSIS — E2839 Other primary ovarian failure: Secondary | ICD-10-CM

## 2019-04-21 DIAGNOSIS — Z780 Asymptomatic menopausal state: Secondary | ICD-10-CM | POA: Diagnosis not present

## 2019-04-21 DIAGNOSIS — Z13820 Encounter for screening for osteoporosis: Secondary | ICD-10-CM | POA: Diagnosis not present

## 2019-05-01 NOTE — Patient Instructions (Addendum)
Tests ordered today. Your results will be released to [facility] (or called to you) after review.  If any changes need to be made, you will be notified at that same time.  All other Health Maintenance issues reviewed.   All recommended immunizations and age-appropriate screenings are up-to-date or discussed.  Shingles #2 immunization administered today.   Medications reviewed and updated.  Changes include :   none  Your prescription(s) have been submitted to your pharmacy. Please take as directed and contact our office if you believe you are having problem(s) with the medication(s).   Please followup in 1 year    Health Maintenance, Female Adopting a healthy lifestyle and getting preventive care are important in promoting health and wellness. Ask your health care provider about:  The right schedule for you to have regular tests and exams.  Things you can do on your own to prevent diseases and keep yourself healthy. What should I know about diet, weight, and exercise? Eat a healthy diet   Eat a diet that includes plenty of vegetables, fruits, low-fat dairy products, and lean protein.  Do not eat a lot of foods that are high in solid fats, added sugars, or sodium. Maintain a healthy weight Body mass index (BMI) is used to identify weight problems. It estimates body fat based on height and weight. Your health care provider can help determine your BMI and help you achieve or maintain a healthy weight. Get regular exercise Get regular exercise. This is one of the most important things you can do for your health. Most adults should:  Exercise for at least 150 minutes each week. The exercise should increase your heart rate and make you sweat (moderate-intensity exercise).  Do strengthening exercises at least twice a week. This is in addition to the moderate-intensity exercise.  Spend less time sitting. Even light physical activity can be beneficial. Watch cholesterol and blood lipids  Have your blood tested for lipids and cholesterol at 66 years of age, then have this test every 5 years. Have your cholesterol levels checked more often if:  Your lipid or cholesterol levels are high.  You are older than 66 years of age.  You are at high risk for heart disease. What should I know about cancer screening? Depending on your health history and family history, you may need to have cancer screening at various ages. This may include screening for:  Breast cancer.  Cervical cancer.  Colorectal cancer.  Skin cancer.  Lung cancer. What should I know about heart disease, diabetes, and high blood pressure? Blood pressure and heart disease  High blood pressure causes heart disease and increases the risk of stroke. This is more likely to develop in people who have high blood pressure readings, are of African descent, or are overweight.  Have your blood pressure checked: ? Every 3-5 years if you are 34-77 years of age. ? Every year if you are 11 years old or older. Diabetes Have regular diabetes screenings. This checks your fasting blood sugar level. Have the screening done:  Once every three years after age 103 if you are at a normal weight and have a low risk for diabetes.  More often and at a younger age if you are overweight or have a high risk for diabetes. What should I know about preventing infection? Hepatitis B If you have a higher risk for hepatitis B, you should be screened for this virus. Talk with your health care provider to find out if you are at  risk for hepatitis B infection. Hepatitis C Testing is recommended for:  Everyone born from [birth year] through 1963.  Anyone with known risk factors for hepatitis C. Sexually transmitted infections (STIs)  Get screened for STIs, including gonorrhea and chlamydia, if: ? You are sexually active and are younger than 66 years of age. ? You are older than 66 years of age and your health care provider tells you that you  are at risk for this type of infection. ? Your sexual activity has changed since you were last screened, and you are at increased risk for chlamydia or gonorrhea. Ask your health care provider if you are at risk.  Ask your health care provider about whether you are at high risk for HIV. Your health care provider may recommend a prescription medicine to help prevent HIV infection. If you choose to take medicine to prevent HIV, you should first get tested for HIV. You should then be tested every 3 months for as long as you are taking the medicine. Pregnancy  If you are about to stop having your period (premenopausal) and you may become pregnant, seek counseling before you get pregnant.  Take 400 to 800 micrograms (mcg) of folic acid every day if you become pregnant.  Ask for birth control (contraception) if you want to prevent pregnancy. Osteoporosis and menopause Osteoporosis is a disease in which the bones lose minerals and strength with aging. This can result in bone fractures. If you are 52 years old or older, or if you are at risk for osteoporosis and fractures, ask your health care provider if you should:  Be screened for bone loss.  Take a calcium or vitamin D supplement to lower your risk of fractures.  Be given hormone replacement therapy (HRT) to treat symptoms of menopause. Follow these instructions at home: Lifestyle  Do not use any products that contain nicotine or tobacco, such as cigarettes, e-cigarettes, and chewing tobacco. If you need help quitting, ask your health care provider.  Do not use street drugs.  Do not share needles.  Ask your health care provider for help if you need support or information about quitting drugs. Alcohol use  Do not drink alcohol if: ? Your health care provider tells you not to drink. ? You are pregnant, may be pregnant, or are planning to become pregnant.  If you drink alcohol: ? Limit how much you use to 0-1 drink a day. ? Limit intake  if you are breastfeeding.  Be aware of how much alcohol is in your drink. In the [place] one drink equals one 12 oz bottle of beer (355 mL), one 5 oz glass of wine (148 mL), or one 1 oz glass of hard liquor (44 mL). General instructions  Schedule regular health, dental, and eye exams.  Stay current with your vaccines.  Tell your health care provider if: ? You often feel depressed. ? You have ever been abused or do not feel safe at home. Summary  Adopting a healthy lifestyle and getting preventive care are important in promoting health and wellness.  Follow your health care provider's instructions about healthy diet, exercising, and getting tested or screened for diseases.  Follow your health care provider's instructions on monitoring your cholesterol and blood pressure. This information is not intended to replace advice given to you by your health care provider. Make sure you discuss any questions you have with your health care provider. Document Released: 03/25/2011 Document Revised: 09/02/2018 Document Reviewed: 09/02/2018 Elsevier Patient Education  2020 [employer]  

## 2019-05-01 NOTE — Progress Notes (Signed)
Subjective:    Patient ID: Jennifer MartinetSusan N Swofford, female    DOB: 04/22/1951, 68 y.o.   MRN: [id number]  HPI She is here for a physical exam.   She denies changes in her health and has no concerns  She has been working from home and is doing well.  She has been walking, has changed her diet and has lost weight.    Medications and allergies reviewed with patient and updated if appropriate.  Patient Active Problem List   Diagnosis Date Noted  . Prediabetes 04/29/2018  . Hypertriglyceridemia 03/10/2018  . Abdominal distension (gaseous) 03/02/2018  . Right upper quadrant pain 03/02/2018  . Renal insufficiency 07/22/2017  . Essential hypertension, benign 01/29/2016  . Anxiety 01/29/2016  . Depression 01/29/2016  . GERD (gastroesophageal reflux disease) 01/29/2016  . 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 S/P ureteral reimplantation 09/13/2014  . S/P bilateral unicompartmental knee replacement 03/22/2013  . OA (osteoarthritis) of knee 03/17/2013  . PES PLANUS 08/28/2009    Current Outpatient Medications on File Prior to Visit  Medication Sig Dispense Refill  . B Complex Vitamins (B COMPLEX 50) TABS daily  0  . Cholecalciferol (VITAMIN D3) 1000 units CAPS daily 60 capsule    No current facility-administered medications on file prior to visit.     Past Medical History:  Diagnosis Date  . Complication of anesthesia    VERY CLAUSTROPHIC W/ MASK  . Depression   . GERD (gastroesophageal reflux disease)   . 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 Hx of transfusion of packed red blood cells   . Hyperlipidemia   . Hypertension    no meds s/p wt loss  . OA (osteoarthritis)   . Postop Transfusion 03/21/2013  . Postoperative wound infection     Past Surgical History:  Procedure Laterality Date  . BREAST REDUCTION SURGERY Bilateral 04-12-2008  . CARDIOVASCULAR STRESS TEST  12-26-2009   Normal nuclear study/  no ischemia/  ef 65%  . CYSTO/   RIGHT RETROGRADE PYELOGRAM/  BALLOON DILATION RIGHT URETER STRICTURE/  STENT PLACEMENT  05-25-2010//   03-14-2009//   01-28-2009  . CYSTOSCOPY WITH STENT PLACEMENT Right 02/18/2014   Procedure: CYSTOSCOPY WITH right STENT PLACEMENT;  Surgeon: Danae ChenMarc H Nesi, MD;  [facility] [place];  Service: Urology;  Laterality: Right;  . EXPLORATORY LAPAROTOMY W/ RIGHT URETER REPAIR  2000  . INCISION AND DRAINAGE ABSCESS N/A 11/28/2014   Procedure: INCISION AND DRAINAGE ABSCESS;  Surgeon: Crist FatBenjamin W Herrick, MD;  [facility] [place];  Service: Urology;  Laterality: N/A;  . INCISION AND DRAINAGE ABSCESS N/A 02/15/2015   Procedure: INCISION AND DRAINAGE OF WOUND;  Surgeon: Crist FatBenjamin W Herrick, MD;  [facility] [place];  Service: Urology;  Laterality: N/A;  . KNEE ARTHROSCOPY  left  2004//   bilateral 1980  . KNEE CLOSED REDUCTION Bilateral 06/02/2013   Procedure: CLOSED MANIPULATION BILATERAL KNEES;  Surgeon: Loanne DrillingFrank V Aluisio, MD;  Location: [facility];  Service: Orthopedics;  Laterality: Bilateral;  . REDUCTION MAMMAPLASTY    . TONSILLECTOMY AND ADENOIDECTOMY  as child  . TOTAL KNEE ARTHROPLASTY Bilateral 03/17/2013   Procedure: TOTAL KNEE BILATERAL;  Surgeon: Loanne DrillingFrank V Aluisio, MD;  Location: [facility];  Service: Orthopedics;  Laterality: Bilateral;  . URETERAL REIMPLANTION Right 09/13/2014   Procedure: OPEN RIGHT URETERAL REIMPLANT;  Surgeon: Crist FatBenjamin W Herrick, MD;  Location: [facility];  Service: Urology;  Laterality: Right;  Marland Kitchen. VAGINAL HYSTERECTOMY  2000   w/ right salpingoophorectomy    Social History   Socioeconomic History  . Marital status: Divorced    Spouse name: Not on file  . Number of children: Not on file  . Years of education: Not on file  . Highest education level: Not on file  Occupational History  . Not on file  Social Needs  . Financial resource strain: Not on file  . Food insecurity    Worry: Not on file    Inability: Not on file  . Transportation  needs    Medical: Not on file    Non-medical: Not on file  Tobacco Use  . Smoking status: Never Smoker  . Smokeless tobacco: Never Used  Substance and Sexual Activity  . Alcohol use: No    Alcohol/week: 0.0 standard drinks  . Drug use: No  . Sexual activity: Not on file  Lifestyle  . Physical activity    Days per week: Not on file    Minutes per session: Not on file  . Stress: Not on file  Relationships  . Social Musicianconnections    Talks on phone: Not on file    Gets together: Not on file    Attends religious service: Not on file    Active member of club or organization: Not on file    Attends meetings of clubs or organizations: Not on file    Relationship status: Not on file  Other Topics Concern  . Not on file  Social History Narrative   [place].  Lives alone in a 2 story home.  Has 3 daughters.     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     Education: college.    Family History  Problem Relation Age of Onset  . Diabetes Mother        Deceased  . Heart attack Mother   . Dementia Mother   . Hypertension Father        Deceased  . Lung cancer Father   . Cancer Other   . Heart Problems Brother        CABG  . Bipolar disorder Sister   . Healthy Daughter   . Breast cancer Maternal Grandmother     Review of Systems  Constitutional: Negative for chills and fever.  Eyes: Negative for visual disturbance.  Respiratory: Negative for cough, shortness of breath and wheezing.   Cardiovascular: Negative for chest pain, palpitations and leg swelling.  Gastrointestinal: Negative for abdominal pain, blood in stool, constipation, diarrhea and nausea.       No gerd  Genitourinary: Negative for dysuria and hematuria.  Musculoskeletal: Negative for arthralgias.  Skin: Negative for color change and rash.  Neurological: Positive for headaches (stress, computer related). Negative for light-headedness.  Psychiatric/Behavioral: Positive for dysphoric mood. The patient is  nervous/anxious.        Objective:   Vitals:   05/03/19 1450  BP: 108/64  Pulse: 61  Resp: 16  Temp: 98.2 F (36.8 C)  SpO2: 98%   Filed Weights   05/03/19 1450  Weight: 195 lb (88.5 kg)   Body mass index is 27.58 kg/m.  BP Readings from Last 3 Encounters:  05/03/19 108/64  10/10/18 122/74  08/04/18 118/70    Wt Readings from Last 3 Encounters:  05/03/19 195 lb (88.5 kg)  10/10/18 210 lb (95.3 kg)  08/04/18 212 lb 12.8 oz (96.5 kg)     Physical Exam Constitutional: She appears well-developed and  well-nourished. No distress.  HENT:  Head: Normocephalic and atraumatic.  Right Ear: External ear normal. Normal ear canal and TM Left Ear: External ear normal.  Normal ear canal and TM Mouth/Throat: Oropharynx is clear and moist.  Eyes: Conjunctivae and EOM are normal.  Neck: Neck supple. No tracheal deviation present. No thyromegaly present.  No carotid bruit  Cardiovascular: Normal rate, regular rhythm and normal heart sounds.   No murmur heard.  No edema. Pulmonary/Chest: Effort normal and breath sounds normal. No respiratory distress. She has no wheezes. She has no rales.  Breast: deferred   Abdominal: Soft. She exhibits no distension. There is no tenderness.  Lymphadenopathy: She has no cervical adenopathy.  Skin: Skin is warm and dry. She is not diaphoretic.  Psychiatric: She has a normal mood and affect. Her behavior is normal.        Assessment & Plan:   Physical exam: Screening blood work ordered Immunizations  tdap - ? Done through gyn,  #2 shingrix today Colonoscopy  Up to date - due 2024 or 2026 - Dr Collene Mares Mammogram   Up to date  Gyn   Dr Garwin Brothers -- Up to date  Dexa    Up to date  - done via gyn Eye exams  Up to date  Exercise   walking Weight  Has lost weight  - diet changes, working on further weight loss Skin  No concerns Substance abuse    none  See Problem List for Assessment and Plan of chronic medical problems.   FU in one year

## 2019-05-03 ENCOUNTER — Other Ambulatory Visit (INDEPENDENT_AMBULATORY_CARE_PROVIDER_SITE_OTHER): Payer: 59

## 2019-05-03 ENCOUNTER — Encounter: Payer: Self-pay | Admitting: Internal Medicine

## 2019-05-03 ENCOUNTER — Other Ambulatory Visit: Payer: Self-pay

## 2019-05-03 ENCOUNTER — Ambulatory Visit (INDEPENDENT_AMBULATORY_CARE_PROVIDER_SITE_OTHER): Payer: 59 | Admitting: Internal Medicine

## 2019-05-03 VITALS — BP 108/64 | HR 61 | Temp 98.2°F | Resp 16 | Ht 70.5 in | Wt 195.0 lb

## 2019-05-03 DIAGNOSIS — Z23 Encounter for immunization: Secondary | ICD-10-CM | POA: Diagnosis not present

## 2019-05-03 DIAGNOSIS — F419 Anxiety disorder, unspecified: Secondary | ICD-10-CM | POA: Diagnosis not present

## 2019-05-03 DIAGNOSIS — E781 Pure hyperglyceridemia: Secondary | ICD-10-CM

## 2019-05-03 DIAGNOSIS — I10 Essential (primary) hypertension: Secondary | ICD-10-CM

## 2019-05-03 DIAGNOSIS — R7303 Prediabetes: Secondary | ICD-10-CM

## 2019-05-03 DIAGNOSIS — F3289 Other specified depressive episodes: Secondary | ICD-10-CM | POA: Diagnosis not present

## 2019-05-03 DIAGNOSIS — Z0000 Encounter for general adult medical examination without abnormal findings: Secondary | ICD-10-CM | POA: Diagnosis not present

## 2019-05-03 DIAGNOSIS — K219 Gastro-esophageal reflux disease without esophagitis: Secondary | ICD-10-CM | POA: Diagnosis not present

## 2019-05-03 LAB — LIPID PANEL
Cholesterol: 218 mg/dL — ABNORMAL HIGH (ref 0–200)
HDL: 60.1 mg/dL (ref >39.00)
LDL Cholesterol: 131 mg/dL — ABNORMAL HIGH (ref 0–99)
NonHDL: 157.61
Total CHOL/HDL Ratio: 4
Triglycerides: 131 mg/dL (ref 0.0–149.0)
VLDL: 26.2 mg/dL (ref 0.0–40.0)

## 2019-05-03 LAB — COMPREHENSIVE METABOLIC PANEL
ALT: 15 U/L (ref 0–35)
AST: 20 U/L (ref 0–37)
Albumin: 4.4 g/dL (ref 3.5–5.2)
Alkaline Phosphatase: 74 U/L (ref 39–117)
BUN: 33 mg/dL — ABNORMAL HIGH (ref 6–23)
CO2: 28 mEq/L (ref 19–32)
Calcium: 10.1 mg/dL (ref 8.4–10.5)
Chloride: 103 mEq/L (ref 96–112)
Creatinine, Ser: 1.3 mg/dL — ABNORMAL HIGH (ref 0.40–1.20)
GFR: 49.61 mL/min — ABNORMAL LOW (ref >60.00)
Glucose, Bld: 103 mg/dL — ABNORMAL HIGH (ref 70–99)
Potassium: 4.5 mEq/L (ref 3.5–5.1)
Sodium: 139 mEq/L (ref 135–145)
Total Bilirubin: 0.3 mg/dL (ref 0.2–1.2)
Total Protein: 7.7 g/dL (ref 6.0–8.3)

## 2019-05-03 LAB — CBC WITH DIFFERENTIAL/PLATELET
Basophils Absolute: 0 10*3/uL (ref 0.0–0.1)
Basophils Relative: 0.3 % (ref 0.0–3.0)
Eosinophils Absolute: 0.1 10*3/uL (ref 0.0–0.7)
Eosinophils Relative: 1.3 % (ref 0.0–5.0)
HCT: 37.1 % (ref 36.0–46.0)
Hemoglobin: 12.5 g/dL (ref 12.0–15.0)
Lymphocytes Relative: 52 % — ABNORMAL HIGH (ref 12.0–46.0)
Lymphs Abs: 4.4 10*3/uL — ABNORMAL HIGH (ref 0.7–4.0)
MCHC: 33.6 g/dL (ref 30.0–36.0)
MCV: 88.9 fl (ref 78.0–100.0)
Monocytes Absolute: 0.7 10*3/uL (ref 0.1–1.0)
Monocytes Relative: 8 % (ref 3.0–12.0)
Neutro Abs: 3.3 10*3/uL (ref 1.4–7.7)
Neutrophils Relative %: 38.4 % — ABNORMAL LOW (ref 43.0–77.0)
Platelets: 273 10*3/uL (ref 150.0–400.0)
RBC: 4.18 Mil/uL (ref 3.87–5.11)
RDW: 14.3 % (ref 11.5–15.5)
WBC: 8.5 10*3/uL (ref 4.0–10.5)

## 2019-05-03 LAB — HEMOGLOBIN A1C: Hgb A1c MFr Bld: 5.8 % (ref 4.6–6.5)

## 2019-05-03 LAB — TSH: TSH: 0.81 u[IU]/mL (ref 0.35–4.50)

## 2019-05-03 MED ORDER — CITALOPRAM HYDROBROMIDE 20 MG PO TABS: 20.0000 mg | ORAL_TABLET | Freq: Every morning | ORAL | 3 refills | Status: DC

## 2019-05-03 MED ORDER — FENOFIBRATE 160 MG PO TABS: 160.0000 mg | ORAL_TABLET | Freq: Every morning | ORAL | 3 refills | Status: DC

## 2019-05-03 MED ORDER — TRIAMTERENE-HCTZ 37.5-25 MG PO TABS: 0.5000 | ORAL_TABLET | Freq: Every day | ORAL | 3 refills | Status: DC

## 2019-05-03 MED ORDER — PANTOPRAZOLE SODIUM 20 MG PO TBEC: 20.0000 mg | DELAYED_RELEASE_TABLET | Freq: Every day | ORAL | 3 refills | Status: DC

## 2019-05-03 NOTE — Assessment & Plan Note (Signed)
Check a1c Low sugar / carb diet Stressed regular exercise   

## 2019-05-03 NOTE — Assessment & Plan Note (Addendum)
BP well controlled - on lower side She will monitor  We may need to change medication to a lower dose of something else if BP goes too low with additional weight loss cmp

## 2019-05-03 NOTE — Assessment & Plan Note (Signed)
Controlled, stable Continue current dose of medication  

## 2019-05-03 NOTE — Assessment & Plan Note (Signed)
Check lipid panel  Continue fenofibrate Regular exercise and healthy diet encouraged Continue weight loss - may be able to d/c fenofibrate

## 2019-05-03 NOTE — Assessment & Plan Note (Signed)
GERD controlled Continue daily medication  

## 2019-05-05 ENCOUNTER — Encounter: Payer: Self-pay | Admitting: Internal Medicine

## 2019-05-05 MED FILL — TRIAMTERENE/HCTZ 37.5/25 TB: 37.5-25 | 90 days supply | Qty: 45 | Fill #0

## 2019-05-05 MED FILL — FENOFIBRATE 160 MG TABLET: 160 | 90 days supply | Qty: 90 | Fill #0

## 2019-05-05 MED FILL — PANTOPRAZOLE SOD DR 20 MG T: 20 | 90 days supply | Qty: 90 | Fill #0

## 2019-05-05 MED FILL — CITALOPRAM HBR 20 MG TABLET: 20 | 90 days supply | Qty: 90 | Fill #0

## 2019-05-17 ENCOUNTER — Other Ambulatory Visit: Payer: 59

## 2019-05-20 ENCOUNTER — Encounter: Payer: Self-pay | Admitting: Internal Medicine

## 2019-05-20 DIAGNOSIS — R944 Abnormal results of kidney function studies: Secondary | ICD-10-CM

## 2019-05-25 ENCOUNTER — Other Ambulatory Visit: Payer: Self-pay

## 2019-05-25 ENCOUNTER — Other Ambulatory Visit: Payer: Self-pay | Admitting: Internal Medicine

## 2019-05-25 ENCOUNTER — Ambulatory Visit
Admission: RE | Admit: 2019-05-25 | Discharge: 2019-05-25 | Disposition: A | Discharge status: Home / Self Care | Payer: 59 | Source: Ambulatory Visit | Attending: Internal Medicine | Admitting: Internal Medicine

## 2019-05-25 DIAGNOSIS — N6320 Unspecified lump in the left breast, unspecified quadrant: Secondary | ICD-10-CM

## 2019-05-25 DIAGNOSIS — N6002 Solitary cyst of left breast: Secondary | ICD-10-CM | POA: Diagnosis not present

## 2019-05-25 DIAGNOSIS — R928 Other abnormal and inconclusive findings on diagnostic imaging of breast: Secondary | ICD-10-CM | POA: Diagnosis not present

## 2019-06-07 ENCOUNTER — Other Ambulatory Visit (INDEPENDENT_AMBULATORY_CARE_PROVIDER_SITE_OTHER): Payer: 59

## 2019-06-07 ENCOUNTER — Encounter: Payer: Self-pay | Admitting: Internal Medicine

## 2019-06-07 DIAGNOSIS — R944 Abnormal results of kidney function studies: Secondary | ICD-10-CM | POA: Diagnosis not present

## 2019-06-07 LAB — BASIC METABOLIC PANEL
BUN: 25 mg/dL — ABNORMAL HIGH (ref 6–23)
CO2: 29 mEq/L (ref 19–32)
Calcium: 9.8 mg/dL (ref 8.4–10.5)
Chloride: 103 mEq/L (ref 96–112)
Creatinine, Ser: 1.19 mg/dL (ref 0.40–1.20)
GFR: 54.92 mL/min — ABNORMAL LOW (ref >60.00)
Glucose, Bld: 77 mg/dL (ref 70–99)
Potassium: 4.2 mEq/L (ref 3.5–5.1)
Sodium: 140 mEq/L (ref 135–145)

## 2019-06-17 ENCOUNTER — Ambulatory Visit: Payer: 59

## 2019-06-25 MED FILL — TRIAMTERENE-HCTZ 37.5-25 MG: 37.5-25 | 90 days supply | Qty: 45 | Fill #0

## 2019-06-25 MED FILL — PANTOPRAZOLE SOD DR 20 MG T: 20 | 90 days supply | Qty: 90 | Fill #0

## 2019-06-25 MED FILL — CITALOPRAM HBR 20 MG TABLET: 20 | 90 days supply | Qty: 90 | Fill #0

## 2019-06-25 MED FILL — FENOFIBRATE 160 MG TABLET: 160 | 90 days supply | Qty: 90 | Fill #0

## 2019-06-29 DIAGNOSIS — R1310 Dysphagia, unspecified: Secondary | ICD-10-CM | POA: Diagnosis not present

## 2019-06-29 DIAGNOSIS — K219 Gastro-esophageal reflux disease without esophagitis: Secondary | ICD-10-CM | POA: Diagnosis not present

## 2019-06-30 ENCOUNTER — Other Ambulatory Visit: Payer: Self-pay | Admitting: Gastroenterology

## 2019-06-30 DIAGNOSIS — R1310 Dysphagia, unspecified: Secondary | ICD-10-CM

## 2019-07-05 ENCOUNTER — Ambulatory Visit
Admission: RE | Admit: 2019-07-05 | Discharge: 2019-07-05 | Disposition: A | Discharge status: Home / Self Care | Payer: 59 | Source: Ambulatory Visit | Attending: Gastroenterology | Admitting: Gastroenterology

## 2019-07-05 DIAGNOSIS — R1310 Dysphagia, unspecified: Secondary | ICD-10-CM

## 2019-07-05 DIAGNOSIS — K224 Dyskinesia of esophagus: Secondary | ICD-10-CM | POA: Diagnosis not present

## 2019-07-07 DIAGNOSIS — K2100 Gastro-esophageal reflux disease with esophagitis, without bleeding: Secondary | ICD-10-CM | POA: Diagnosis not present

## 2019-07-07 DIAGNOSIS — K224 Dyskinesia of esophagus: Secondary | ICD-10-CM | POA: Diagnosis not present

## 2019-07-07 MED FILL — DEXILANT DR 60 MG CAPSULE: 60 | 30 days supply | Qty: 30 | Fill #0

## 2019-07-07 MED FILL — FAMOTIDINE 20 MG TABLET: 20 | 30 days supply | Qty: 30 | Fill #0

## 2019-07-18 ENCOUNTER — Emergency Department (HOSPITAL_BASED_OUTPATIENT_CLINIC_OR_DEPARTMENT_OTHER)
Admission: EM | Admit: 2019-07-18 | Discharge: 2019-07-18 | Disposition: A | Discharge status: Home / Self Care | Payer: 59 | Attending: Emergency Medicine | Admitting: Emergency Medicine

## 2019-07-18 ENCOUNTER — Other Ambulatory Visit: Payer: Self-pay

## 2019-07-18 ENCOUNTER — Encounter (HOSPITAL_BASED_OUTPATIENT_CLINIC_OR_DEPARTMENT_OTHER): Payer: Self-pay

## 2019-07-18 DIAGNOSIS — Z79899 Other long term (current) drug therapy: Secondary | ICD-10-CM | POA: Diagnosis not present

## 2019-07-18 DIAGNOSIS — I10 Essential (primary) hypertension: Secondary | ICD-10-CM | POA: Diagnosis not present

## 2019-07-18 DIAGNOSIS — R7303 Prediabetes: Secondary | ICD-10-CM | POA: Diagnosis not present

## 2019-07-18 DIAGNOSIS — R42 Dizziness and giddiness: Secondary | ICD-10-CM | POA: Insufficient documentation

## 2019-07-18 LAB — URINALYSIS, ROUTINE W REFLEX MICROSCOPIC
Bilirubin Urine: NEGATIVE
Glucose, UA: NEGATIVE mg/dL
Hgb urine dipstick: NEGATIVE
Ketones, ur: NEGATIVE mg/dL
Nitrite: NEGATIVE
Protein, ur: NEGATIVE mg/dL
Specific Gravity, Urine: 1.02 (ref 1.005–1.030)
pH: 5.5 (ref 5.0–8.0)

## 2019-07-18 LAB — BASIC METABOLIC PANEL
Anion gap: 9 (ref 5–15)
BUN: 23 mg/dL (ref 8–23)
CO2: 27 mmol/L (ref 22–32)
Calcium: 9.4 mg/dL (ref 8.9–10.3)
Chloride: 101 mmol/L (ref 98–111)
Creatinine, Ser: 1.23 mg/dL — ABNORMAL HIGH (ref 0.44–1.00)
GFR calc Af Amer: 53 mL/min — ABNORMAL LOW (ref >60)
GFR calc non Af Amer: 46 mL/min — ABNORMAL LOW (ref >60)
Glucose, Bld: 90 mg/dL (ref 70–99)
Potassium: 3.8 mmol/L (ref 3.5–5.1)
Sodium: 137 mmol/L (ref 135–145)

## 2019-07-18 LAB — CBC
HCT: 36.5 % (ref 36.0–46.0)
Hemoglobin: 11.7 g/dL — ABNORMAL LOW (ref 12.0–15.0)
MCH: 29.5 pg (ref 26.0–34.0)
MCHC: 32.1 g/dL (ref 30.0–36.0)
MCV: 92.2 fL (ref 80.0–100.0)
Platelets: 234 10*3/uL (ref 150–400)
RBC: 3.96 MIL/uL (ref 3.87–5.11)
RDW: 13.2 % (ref 11.5–15.5)
WBC: 10 10*3/uL (ref 4.0–10.5)
nRBC: 0 % (ref 0.0–0.2)

## 2019-07-18 LAB — URINALYSIS, MICROSCOPIC (REFLEX)

## 2019-07-18 MED ORDER — SODIUM CHLORIDE 0.9 % IV BOLUS
1000.0000 mL | Freq: Once | INTRAVENOUS | Status: AC
  Administered 2019-07-18: 14:00:00 1000 mL via INTRAVENOUS

## 2019-07-18 MED ORDER — SODIUM CHLORIDE 0.9 % IV BOLUS
1000.0000 mL | Freq: Once | INTRAVENOUS | Status: AC
  Administered 2019-07-18: 16:00:00 1000 mL via INTRAVENOUS

## 2019-07-18 NOTE — ED Triage Notes (Signed)
Pt reporting low B/P ("in the 90's"), lightheadedness, nausea, fatigue for 2 days.

## 2019-07-18 NOTE — ED Triage Notes (Signed)
Pt states she was recently started on Dexalant and Pepcid last week.

## 2019-07-18 NOTE — ED Notes (Signed)
Ambulated without difficulty.  Patient stated that she feels a little bit dizzy when she got up.  Tolerated well.

## 2019-07-18 NOTE — ED Notes (Signed)
ED Provider at bedside. 

## 2019-07-18 NOTE — ED Provider Notes (Signed)
[facility] EMERGENCY DEPARTMENT Provider Note   CSN: [id number] Arrival date & time: 07/18/19  1307     History   Chief Complaint Chief Complaint  Patient presents with  . Dizziness    HPI Jennifer Sanford is a 68 y.o. female.     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  She began to feel unusual on the night of 07/16/2019.  She reports some mild cramping abdominal discomfort with dizziness.  Dizziness is described as a spinning sensation.  She states that it is worse when she bends over and stands back up.  It is also somewhat worse with movement.  She also describes feeling lightheaded and off-balance at times.  When she stands up she needs to hold onto something to help from falling over.  She denies any chest pain, cough, shortness of breath.  No recent fevers or URI symptoms.  No constipation or diarrhea.  No urinary symptoms.  No lower extremity swelling or rashes.  No treatments prior to arrival.  Patient states that she has been monitoring her blood pressures at home and these have been running low recently.  For this reason, she has not taken her blood pressure medication in about 5 days.  Patient denies signs of stroke including: facial droop, slurred speech, aphasia, weakness/numbness in extremities.      Past Medical History:  Diagnosis Date  . Complication of anesthesia    VERY CLAUSTROPHIC W/ MASK  . Depression   . GERD (gastroesophageal reflux disease)   . 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 Hx of transfusion of packed red blood cells   . Hyperlipidemia   . Hypertension    no meds s/p wt loss  . OA (osteoarthritis)   . Postop Transfusion 03/21/2013  . Postoperative wound infection     Patient Active Problem List   Diagnosis Date Noted  . Prediabetes  04/29/2018  . Hypertriglyceridemia 03/10/2018  . Abdominal distension (gaseous) 03/02/2018  . Right upper quadrant pain 03/02/2018  . Renal insufficiency 07/22/2017  . Essential hypertension, benign 01/29/2016  . Anxiety 01/29/2016  . Depression 01/29/2016  . GERD (gastroesophageal reflux disease) 01/29/2016  . 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 S/P ureteral reimplantation 09/13/2014  . S/P bilateral unicompartmental knee replacement 03/22/2013  . OA (osteoarthritis) of knee 03/17/2013  . PES PLANUS 08/28/2009    Past Surgical History:  Procedure Laterality Date  . BREAST REDUCTION SURGERY Bilateral 04-12-2008  . CARDIOVASCULAR STRESS TEST  12-26-2009   Normal nuclear study/  no ischemia/  ef 65%  . CYSTO/  RIGHT RETROGRADE PYELOGRAM/  BALLOON DILATION RIGHT URETER STRICTURE/  STENT PLACEMENT  05-25-2010//   03-14-2009//   01-28-2009  . CYSTOSCOPY WITH STENT PLACEMENT Right 02/18/2014   Procedure: CYSTOSCOPY WITH right STENT PLACEMENT;  Surgeon: Danae ChenMarc H Nesi, MD;  [facility] [place];  Service: Urology;  Laterality: Right;  . EXPLORATORY LAPAROTOMY W/ RIGHT URETER REPAIR  2000  . INCISION AND DRAINAGE ABSCESS N/A 11/28/2014   Procedure: INCISION AND DRAINAGE ABSCESS;  Surgeon: Crist FatBenjamin W Herrick, MD;  [facility] [place];  Service: Urology;  Laterality: N/A;  . INCISION AND DRAINAGE ABSCESS N/A 02/15/2015   Procedure: INCISION AND DRAINAGE OF WOUND;  Surgeon: Crist FatBenjamin W Herrick, MD;  [facility] [place];  Service: Urology;  Laterality:  N/A;  . KNEE ARTHROSCOPY  left  2004//   bilateral 1980  . KNEE CLOSED REDUCTION Bilateral 06/02/2013   Procedure: CLOSED MANIPULATION BILATERAL KNEES;  Surgeon: Loanne Drilling, MD;  Location: [facility];  Service: Orthopedics;  Laterality: Bilateral;  . REDUCTION MAMMAPLASTY    . TONSILLECTOMY AND ADENOIDECTOMY  as child  . TOTAL KNEE ARTHROPLASTY Bilateral 03/17/2013   Procedure: TOTAL KNEE BILATERAL;   Surgeon: Loanne Drilling, MD;  Location: [facility];  Service: Orthopedics;  Laterality: Bilateral;  . URETERAL REIMPLANTION Right 09/13/2014   Procedure: OPEN RIGHT URETERAL REIMPLANT;  Surgeon: Crist Fat, MD;  Location[facility];  Service: Urology;  Laterality: Right;  Marland Kitchen VAGINAL HYSTERECTOMY  2000   w/ right salpingoophorectomy     OB History   No obstetric history on file.      Home Medications    Prior to Admission medications   Medication Sig Start Date End Date Taking? Authorizing Provider  B Complex Vitamins (B COMPLEX 50) TABS daily 01/29/16   Pincus Sanes, MD  Cholecalciferol (VITAMIN D3) 1000 units CAPS daily 01/29/16   Pincus Sanes, MD  citalopram (CELEXA) 20 MG tablet Take 1 tablet (20 mg total) by mouth every morning. 05/03/19   Pincus Sanes, MD  fenofibrate 160 MG tablet Take 1 tablet (160 mg total) by mouth every morning. 05/03/19   Pincus Sanes, MD  pantoprazole (PROTONIX) 20 MG tablet Take 1 tablet (20 mg total) by mouth daily. 05/03/19   Pincus Sanes, MD  triamterene-hydrochlorothiazide (MAXZIDE-25) 37.5-25 MG tablet Take 0.5 tablets by mouth at bedtime. 05/03/19   Pincus Sanes, MD    Family History Family History  Problem Relation Age of Onset  . Diabetes Mother        Deceased  . Heart attack Mother   . Dementia Mother   . Hypertension Father        Deceased  . Lung cancer Father   . Cancer Other   . Heart Problems Brother        CABG  . Bipolar disorder Sister   . Healthy Daughter   . Breast cancer Maternal Grandmother     Social History Social History   Tobacco Use  . Smoking status: Never Smoker  . Smokeless tobacco: Never Used  Substance Use Topics  . Alcohol use: No    Alcohol/week: 0.0 standard drinks  . Drug use: No     Allergies   Patient has no known allergies.   Review of Systems Review of Systems  Constitutional: Negative for fever.  HENT: Negative for congestion, dental problem, rhinorrhea, sinus pressure and sore  throat.   Eyes: Negative for photophobia, discharge, redness and visual disturbance.  Respiratory: Negative for cough and shortness of breath.   Cardiovascular: Negative for chest pain.  Gastrointestinal: Positive for nausea. Negative for abdominal pain, blood in stool, constipation, diarrhea and vomiting.       + abd cramping  Genitourinary: Negative for dysuria, hematuria and vaginal bleeding.  Musculoskeletal: Negative for gait problem, myalgias, neck pain and neck stiffness.  Skin: Negative for rash.  Neurological: Positive for dizziness and light-headedness. Negative for syncope, facial asymmetry, speech difficulty, weakness, numbness and headaches.  Psychiatric/Behavioral: Negative for confusion.     Physical Exam Updated Vital Signs BP 109/81 (BP Location: Left Arm)   Pulse (!) 55   Temp 98.4 F (36.9 C) (Oral)   Resp 20   Ht  (1.803 m)   Wt 86.2  kg   SpO2 100%   BMI 26.50 kg/m   Physical Exam Vitals signs and nursing note reviewed.  Constitutional:      Appearance: She is well-developed.  HENT:     Head: Normocephalic and atraumatic.     Right Ear: Tympanic membrane, ear canal and external ear normal.     Left Ear: Tympanic membrane, ear canal and external ear normal.     Nose: Nose normal.     Mouth/Throat:     Pharynx: Uvula midline.  Eyes:     General: Lids are normal.        Right eye: No discharge.        Left eye: No discharge.     Extraocular Movements:     Right eye: No nystagmus.     Left eye: No nystagmus.     Conjunctiva/sclera: Conjunctivae normal.     Pupils: Pupils are equal, round, and reactive to light.  Neck:     Musculoskeletal: Normal range of motion and neck supple.  Cardiovascular:     Rate and Rhythm: Normal rate and regular rhythm.     Heart sounds: Normal heart sounds.  Pulmonary:     Effort: Pulmonary effort is normal.     Breath sounds: Normal breath sounds.  Abdominal:     Palpations: Abdomen is soft.     Tenderness:  There is no abdominal tenderness. There is no guarding or rebound.  Musculoskeletal:     Cervical back: She exhibits normal range of motion, no tenderness and no bony tenderness.  Skin:    General: Skin is warm and dry.  Neurological:     Mental Status: She is alert and oriented to person, place, and time.     GCS: GCS eye subscore is 4. GCS verbal subscore is 5. GCS motor subscore is 6.     Cranial Nerves: No cranial nerve deficit.     Sensory: No sensory deficit.     Coordination: Coordination normal.     Gait: Gait normal.     Deep Tendon Reflexes: Reflexes are normal and symmetric.      ED Treatments / Results  Labs (all labs ordered are listed, but only abnormal results are displayed) Labs Reviewed  CBC - Abnormal; Notable for the following components:      Result Value   Hemoglobin 11.7 (*)    All other components within normal limits  BASIC METABOLIC PANEL  URINALYSIS, ROUTINE W REFLEX MICROSCOPIC    ED ECG REPORT   Date: 07/18/2019  Rate: 53  Rhythm: sinus bradycardia  QRS Axis: normal  Intervals: normal  ST/T Wave abnormalities: normal  Conduction Disutrbances:none  Narrative Interpretation:   Old EKG Reviewed: unchanged  I have personally reviewed the EKG tracing and agree with the computerized printout as noted.  Radiology No results found.  Procedures Procedures (including critical care time)  Medications Ordered in ED Medications  sodium chloride 0.9 % bolus 1,000 mL (1,000 mLs Intravenous New Bag/Given 07/18/19 1426)     Initial Impression / Assessment and Plan / ED Course  I have reviewed the triage vital signs and the nursing notes.  Pertinent labs & imaging results that were available during my care of the patient were reviewed by me and considered in my medical decision making (see chart for details).        Patient seen and examined. Work-up initiated.  Patient with benign exam.  Will check orthostatics, EKG, basic lab work, UA.  She  does not  have any reproducible nystagmus on exam with turning of her head.  Possible orthostatic changes.  EKG reviewed showing sinus bradycardia without signs of ischemia.  Vital signs reviewed and are as follows: BP 109/81 (BP Location: Left Arm)   Pulse (!) 55   Temp 98.4 F (36.9 C) (Oral)   Resp 20   Ht 5\' 11"  (1.803 m)   Wt 86.2 kg   SpO2 100%   BMI 26.50 kg/m   Patient discussed with and seen by Dr. .  Patient has received 2 L of IV normal saline with improvement in her symptoms.  She states that she is feeling much better.  Comfortable with discharged home at this time.  We discussed her lab results and UA results.  She is not having any symptoms consistent with UTI.  Encourage PCP follow-up as needed.  Encouraged return with any worsening or changing symptoms.  Final Clinical Impressions(s) / ED Diagnoses   Final diagnoses:  Dizziness   Patient with positional dizziness, unclear if orthostatic versus vertigo.  Symptoms are minor.  Improved with IV fluids here.  Lab work-up is reassuring.  Low concern for posterior circulation stroke or cardiac etiology at this time.  EKG shows sinus bradycardia however this is not new.  Patient is already discontinued her blood pressure medications and will follow up with her doctor regarding this.  ED Discharge Orders    None       Jacqulyn Bath, Renne Crigler 07/18/19 1732    07/20/19, MD 07/19/19 1011

## 2019-07-18 NOTE — Discharge Instructions (Signed)
Please read and follow all provided instructions.  Your diagnoses today include:  1. Dizziness     Tests performed today include:  EKG  Blood counts and electrolytes  Vital signs. See below for your results today.   Medications prescribed:   None  Take any prescribed medications only as directed.  Home care instructions:  Follow any educational materials contained in this packet.  BE VERY CAREFUL not to take multiple medicines containing Tylenol (also called acetaminophen). Doing so can lead to an overdose which can damage your liver and cause liver failure and possibly death.   Follow-up instructions: Please follow-up with your primary care provider in the next 3 days for further evaluation of your symptoms.   Return instructions:   Please return to the Emergency Department if you experience worsening symptoms.   Please return if you have any other emergent concerns.  Additional Information:  Your vital signs today were: BP 109/81 (BP Location: Left Arm)    Pulse (!) 55    Temp 98.4 F (36.9 C) (Oral)    Resp 20    Ht 5\' 11"  (1.803 m)    Wt 86.2 kg    SpO2 100%    BMI 26.50 kg/m  If your blood pressure (BP) was elevated above 135/85 this visit, please have this repeated by your doctor within one month. --------------

## 2019-07-23 ENCOUNTER — Encounter: Payer: Self-pay | Admitting: Internal Medicine

## 2019-07-23 DIAGNOSIS — M79676 Pain in unspecified toe(s): Secondary | ICD-10-CM

## 2019-07-23 DIAGNOSIS — E860 Dehydration: Secondary | ICD-10-CM

## 2019-07-28 ENCOUNTER — Ambulatory Visit: Payer: 59 | Admitting: Internal Medicine

## 2019-08-02 MED FILL — DEXILANT DR 60 MG CAPSULE: 60 | 30 days supply | Qty: 30 | Fill #1

## 2019-08-09 DIAGNOSIS — K224 Dyskinesia of esophagus: Secondary | ICD-10-CM | POA: Diagnosis not present

## 2019-08-09 DIAGNOSIS — K2100 Gastro-esophageal reflux disease with esophagitis, without bleeding: Secondary | ICD-10-CM | POA: Diagnosis not present

## 2019-08-09 MED FILL — FAMOTIDINE 20 MG TABLET: 20 | 30 days supply | Qty: 30 | Fill #1

## 2019-09-09 MED FILL — FAMOTIDINE 20 MG TABLET: 20 | 30 days supply | Qty: 30 | Fill #2

## 2019-09-09 MED FILL — DEXILANT DR 60 MG CAPSULE: 60 | 30 days supply | Qty: 30 | Fill #2

## 2019-10-08 MED FILL — DEXILANT DR 60 MG CAPSULE: 60 | 30 days supply | Qty: 30 | Fill #3

## 2019-10-08 MED FILL — FAMOTIDINE 20 MG TABS: 20 | 30 days supply | Qty: 30 | Fill #3

## 2019-10-08 MED FILL — TRIAMTERENE/HCTZ 37.5/25 TB: 37.5-25 | 90 days supply | Qty: 45 | Fill #1

## 2019-11-01 MED FILL — HYDROCORTISONE ACETATE 25 M: 25 | 30 days supply | Qty: 30 | Fill #0

## 2019-11-09 MED FILL — FAMOTIDINE 20 MG TABS: 20 | 30 days supply | Qty: 30 | Fill #0

## 2019-11-23 ENCOUNTER — Ambulatory Visit
Admission: RE | Admit: 2019-11-23 | Discharge: 2019-11-23 | Disposition: A | Discharge status: Home / Self Care | Payer: Medicare HMO | Source: Ambulatory Visit | Attending: Internal Medicine | Admitting: Internal Medicine

## 2019-11-23 ENCOUNTER — Other Ambulatory Visit: Payer: Self-pay | Admitting: Internal Medicine

## 2019-11-23 ENCOUNTER — Other Ambulatory Visit: Payer: Self-pay

## 2019-11-23 DIAGNOSIS — N6320 Unspecified lump in the left breast, unspecified quadrant: Secondary | ICD-10-CM

## 2019-11-23 DIAGNOSIS — N6322 Unspecified lump in the left breast, upper inner quadrant: Secondary | ICD-10-CM | POA: Diagnosis not present

## 2019-11-23 MED FILL — OMEPRAZOLE 40 MG CPDR: 40 | 30 days supply | Qty: 30 | Fill #0

## 2019-11-24 MED FILL — FENOFIBRATE 160 MG TABLET: 160 | 90 days supply | Qty: 90 | Fill #1

## 2019-11-26 MED FILL — DEXILANT DR 60 MG CAPSULE: 60 | 30 days supply | Qty: 30 | Fill #0

## 2019-12-14 ENCOUNTER — Telehealth: Payer: Self-pay

## 2019-12-14 DIAGNOSIS — F330 Major depressive disorder, recurrent, mild: Secondary | ICD-10-CM | POA: Diagnosis not present

## 2019-12-14 NOTE — Telephone Encounter (Signed)
Spoke with patient about reschedule an appointment and she mentions that last night that she had a reaction to fresh shrimp and advil and wanted to add those to her allergy list. States that with the shrimp, she has itching. Denies any trouble breathing or tongue swelling. With Advil, she mentions hives and welps and denies trouble breathing or tongue swelling.   Would like to add both of these to her allergy list.

## 2019-12-14 NOTE — Telephone Encounter (Signed)
Allergies added

## 2019-12-28 ENCOUNTER — Telehealth: Payer: Self-pay | Admitting: Internal Medicine

## 2019-12-28 MED ORDER — TRIAMTERENE-HCTZ 37.5-25 MG PO TABS: 0.5000 | ORAL_TABLET | Freq: Every day | ORAL | 1 refills | Status: DC

## 2019-12-28 MED ORDER — FENOFIBRATE 160 MG PO TABS: 160.0000 mg | ORAL_TABLET | Freq: Every morning | ORAL | 1 refills | Status: DC

## 2019-12-28 MED ORDER — DEXLANSOPRAZOLE 30 MG PO CPDR: 30.0000 mg | DELAYED_RELEASE_CAPSULE | Freq: Every day | ORAL | 1 refills | Status: DC

## 2019-12-28 MED ORDER — CITALOPRAM HYDROBROMIDE 20 MG PO TABS: 20.0000 mg | ORAL_TABLET | Freq: Every morning | ORAL | 1 refills | Status: DC

## 2019-12-28 NOTE — Telephone Encounter (Signed)
New Message:   Pt is calling and ask for a call back as soon as possible to discuss some medications. She states she sent a mychart message and if you can please check that message before calling. Please advise.

## 2019-12-28 NOTE — Telephone Encounter (Signed)
All medication have been sent to [facility] per pts request. Pt is asking about dexilant. Can she get back on that? It helped better with her acid reflux. If so can you send to [facility] as well or let me know the strength. Thank you

## 2019-12-28 NOTE — Telephone Encounter (Signed)
Not sure what dose of Dexilant she was on in the past-not in her previous medication list.  Sent Dexilant 30 mg 1 daily to p.o.f

## 2020-01-03 NOTE — Telephone Encounter (Addendum)
Received a form that stated Dexilant is a more expensive medciation and that omeprazole is cheaper alternative. I entered prior auth for it, determination came back that dexilant is available without prior auth.    KeyChristella Scheuermann

## 2020-02-14 ENCOUNTER — Other Ambulatory Visit: Payer: Medicare HMO

## 2020-02-18 ENCOUNTER — Other Ambulatory Visit: Payer: Medicare HMO

## 2020-03-10 ENCOUNTER — Other Ambulatory Visit: Payer: Medicare HMO

## 2020-03-15 ENCOUNTER — Other Ambulatory Visit: Payer: Medicare HMO

## 2020-03-17 DIAGNOSIS — Z01419 Encounter for gynecological examination (general) (routine) without abnormal findings: Secondary | ICD-10-CM | POA: Diagnosis not present

## 2020-03-21 DIAGNOSIS — R1084 Generalized abdominal pain: Secondary | ICD-10-CM | POA: Diagnosis not present

## 2020-03-21 DIAGNOSIS — R109 Unspecified abdominal pain: Secondary | ICD-10-CM | POA: Diagnosis not present

## 2020-04-04 ENCOUNTER — Ambulatory Visit
Admission: RE | Admit: 2020-04-04 | Discharge: 2020-04-04 | Disposition: A | Discharge status: Home / Self Care | Payer: Medicare HMO | Source: Ambulatory Visit | Attending: Internal Medicine | Admitting: Internal Medicine

## 2020-04-04 ENCOUNTER — Other Ambulatory Visit: Payer: Self-pay

## 2020-04-04 DIAGNOSIS — N6322 Unspecified lump in the left breast, upper inner quadrant: Secondary | ICD-10-CM | POA: Diagnosis not present

## 2020-04-04 DIAGNOSIS — R928 Other abnormal and inconclusive findings on diagnostic imaging of breast: Secondary | ICD-10-CM | POA: Diagnosis not present

## 2020-04-04 DIAGNOSIS — N6320 Unspecified lump in the left breast, unspecified quadrant: Secondary | ICD-10-CM

## 2020-04-20 DIAGNOSIS — M25562 Pain in left knee: Secondary | ICD-10-CM | POA: Diagnosis not present

## 2020-04-20 DIAGNOSIS — M25561 Pain in right knee: Secondary | ICD-10-CM | POA: Diagnosis not present

## 2020-04-20 DIAGNOSIS — Z96653 Presence of artificial knee joint, bilateral: Secondary | ICD-10-CM | POA: Diagnosis not present

## 2020-04-26 DIAGNOSIS — H43393 Other vitreous opacities, bilateral: Secondary | ICD-10-CM | POA: Diagnosis not present

## 2020-05-03 ENCOUNTER — Encounter: Payer: 59 | Admitting: Internal Medicine

## 2020-05-09 NOTE — Patient Instructions (Signed)
Blood work was ordered.    All other Health Maintenance issues reviewed.   All recommended immunizations and age-appropriate screenings are up-to-date or discussed.  No immunization administered today.   Medications reviewed and updated.  Changes include :     Your prescription(s) have been submitted to your pharmacy. Please take as directed and contact our office if you believe you are having problem(s) with the medication(s).  A referral was ordered for      Someone will call you to schedule an appointment.    Please followup in XX months    Health Maintenance, Female Adopting a healthy lifestyle and getting preventive care are important in promoting health and wellness. Ask your health care provider about:  The right schedule for you to have regular tests and exams.  Things you can do on your own to prevent diseases and keep yourself healthy. What should I know about diet, weight, and exercise? Eat a healthy diet   Eat a diet that includes plenty of vegetables, fruits, low-fat dairy products, and lean protein.  Do not eat a lot of foods that are high in solid fats, added sugars, or sodium. Maintain a healthy weight Body mass index (BMI) is used to identify weight problems. It estimates body fat based on height and weight. Your health care provider can help determine your BMI and help you achieve or maintain a healthy weight. Get regular exercise Get regular exercise. This is one of the most important things you can do for your health. Most adults should:  Exercise for at least 150 minutes each week. The exercise should increase your heart rate and make you sweat (moderate-intensity exercise).  Do strengthening exercises at least twice a week. This is in addition to the moderate-intensity exercise.  Spend less time sitting. Even light physical activity can be beneficial. Watch cholesterol and blood lipids Have your blood tested for lipids and cholesterol at 67 years of  age, then have this test every 5 years. Have your cholesterol levels checked more often if:  Your lipid or cholesterol levels are high.  You are older than 67 years of age.  You are at high risk for heart disease. What should I know about cancer screening? Depending on your health history and family history, you may need to have cancer screening at various ages. This may include screening for:  Breast cancer.  Cervical cancer.  Colorectal cancer.  Skin cancer.  Lung cancer. What should I know about heart disease, diabetes, and high blood pressure? Blood pressure and heart disease  High blood pressure causes heart disease and increases the risk of stroke. This is more likely to develop in people who have high blood pressure readings, are of African descent, or are overweight.  Have your blood pressure checked: ? Every 3-5 years if you are 18-39 years of age. ? Every year if you are 40 years old or older. Diabetes Have regular diabetes screenings. This checks your fasting blood sugar level. Have the screening done:  Once every three years after age 40 if you are at a normal weight and have a low risk for diabetes.  More often and at a younger age if you are overweight or have a high risk for diabetes. What should I know about preventing infection? Hepatitis B If you have a higher risk for hepatitis B, you should be screened for this virus. Talk with your health care provider to find out if you are at risk for hepatitis B infection. Hepatitis C   Testing is recommended for:  Everyone born from 1945 through 1965.  Anyone with known risk factors for hepatitis C. Sexually transmitted infections (STIs)  Get screened for STIs, including gonorrhea and chlamydia, if: ? You are sexually active and are younger than 67 years of age. ? You are older than 67 years of age and your health care provider tells you that you are at risk for this type of infection. ? Your sexual activity has  changed since you were last screened, and you are at increased risk for chlamydia or gonorrhea. Ask your health care provider if you are at risk.  Ask your health care provider about whether you are at high risk for HIV. Your health care provider may recommend a prescription medicine to help prevent HIV infection. If you choose to take medicine to prevent HIV, you should first get tested for HIV. You should then be tested every 3 months for as long as you are taking the medicine. Pregnancy  If you are about to stop having your period (premenopausal) and you may become pregnant, seek counseling before you get pregnant.  Take 400 to 800 micrograms (mcg) of folic acid every day if you become pregnant.  Ask for birth control (contraception) if you want to prevent pregnancy. Osteoporosis and menopause Osteoporosis is a disease in which the bones lose minerals and strength with aging. This can result in bone fractures. If you are 65 years old or older, or if you are at risk for osteoporosis and fractures, ask your health care provider if you should:  Be screened for bone loss.  Take a calcium or vitamin D supplement to lower your risk of fractures.  Be given hormone replacement therapy (HRT) to treat symptoms of menopause. Follow these instructions at home: Lifestyle  Do not use any products that contain nicotine or tobacco, such as cigarettes, e-cigarettes, and chewing tobacco. If you need help quitting, ask your health care provider.  Do not use street drugs.  Do not share needles.  Ask your health care provider for help if you need support or information about quitting drugs. Alcohol use  Do not drink alcohol if: ? Your health care provider tells you not to drink. ? You are pregnant, may be pregnant, or are planning to become pregnant.  If you drink alcohol: ? Limit how much you use to 0-1 drink a day. ? Limit intake if you are breastfeeding.  Be aware of how much alcohol is in  your drink. In the [place] one drink equals one 12 oz bottle of beer (355 mL), one 5 oz glass of wine (148 mL), or one 1 oz glass of hard liquor (44 mL). General instructions  Schedule regular health, dental, and eye exams.  Stay current with your vaccines.  Tell your health care provider if: ? You often feel depressed. ? You have ever been abused or do not feel safe at home. Summary  Adopting a healthy lifestyle and getting preventive care are important in promoting health and wellness.  Follow your health care provider's instructions about healthy diet, exercising, and getting tested or screened for diseases.  Follow your health care provider's instructions on monitoring your cholesterol and blood pressure. This information is not intended to replace advice given to you by your health care provider. Make sure you discuss any questions you have with your health care provider. Document Revised: 09/02/2018 Document Reviewed: 09/02/2018 Elsevier Patient Education  2020 Elsevier Inc.  

## 2020-05-09 NOTE — Progress Notes (Signed)
Subjective:    Patient ID: Jennifer Sanford, female    DOB: 1950/12/14, 69 y.o.   MRN: [id number]  HPI She is here for a physical exam.     Medications and allergies reviewed with patient and updated if appropriate.  Patient Active Problem List   Diagnosis Date Noted  . Prediabetes 04/29/2018  . Hypertriglyceridemia 03/10/2018  . Abdominal distension (gaseous) 03/02/2018  . Right upper quadrant pain 03/02/2018  . Renal insufficiency 07/22/2017  . Essential hypertension, benign 01/29/2016  . Anxiety 01/29/2016  . Depression 01/29/2016  . GERD (gastroesophageal reflux disease) 01/29/2016  . 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 S/P ureteral reimplantation 09/13/2014  . S/P bilateral unicompartmental knee replacement 03/22/2013  . OA (osteoarthritis) of knee 03/17/2013  . PES PLANUS 08/28/2009    Current Outpatient Medications on File Prior to Visit  Medication Sig Dispense Refill  . B Complex Vitamins (B COMPLEX 50) TABS daily  0  . Cholecalciferol (VITAMIN D3) 1000 units CAPS daily 60 capsule   . citalopram (CELEXA) 20 MG tablet Take 1 tablet (20 mg total) by mouth every morning. 90 tablet 1  . Dexlansoprazole 30 MG capsule Take 1 capsule (30 mg total) by mouth daily. 90 capsule 1  . fenofibrate 160 MG tablet Take 1 tablet (160 mg total) by mouth every morning. 90 tablet 1  . triamterene-hydrochlorothiazide (MAXZIDE-25) 37.5-25 MG tablet Take 0.5 tablets by mouth at bedtime. 45 tablet 1   No current facility-administered medications on file prior to visit.    Past Medical History:  Diagnosis Date  . Complication of anesthesia    VERY CLAUSTROPHIC W/ MASK  . Depression   . GERD (gastroesophageal reflux disease)   . 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 Hx of transfusion of packed red blood cells   . Hyperlipidemia   . Hypertension    no meds s/p wt loss  . OA (osteoarthritis)   . Postop Transfusion 03/21/2013    . Postoperative wound infection     Past Surgical History:  Procedure Laterality Date  . BREAST REDUCTION SURGERY Bilateral 04-12-2008  . CARDIOVASCULAR STRESS TEST  12-26-2009   Normal nuclear study/  no ischemia/  ef 65%  . CYSTO/  RIGHT RETROGRADE PYELOGRAM/  BALLOON DILATION RIGHT URETER STRICTURE/  STENT PLACEMENT  05-25-2010//   03-14-2009//   01-28-2009  . CYSTOSCOPY WITH STENT PLACEMENT Right 02/18/2014   Procedure: CYSTOSCOPY WITH right STENT PLACEMENT;  Surgeon: Danae Chen, MD;  Location: [facility];  Service: Urology;  Laterality: Right;  . EXPLORATORY LAPAROTOMY W/ RIGHT URETER REPAIR  2000  . INCISION AND DRAINAGE ABSCESS N/A 11/28/2014   Procedure: INCISION AND DRAINAGE ABSCESS;  Surgeon: Crist Fat, MD;  Location: [facility];  Service: Urology;  Laterality: N/A;  . INCISION AND DRAINAGE ABSCESS N/A 02/15/2015   Procedure: INCISION AND DRAINAGE OF WOUND;  Surgeon: Crist Fat, MD;  Location: [facility];  Service: Urology;  Laterality: N/A;  . KNEE ARTHROSCOPY  left  2004//   bilateral 1980  . KNEE CLOSED REDUCTION Bilateral 06/02/2013   Procedure: CLOSED MANIPULATION BILATERAL KNEES;  Surgeon: Loanne Drilling, MD;  Location: [facility];  Service: Orthopedics;  Laterality: Bilateral;  . REDUCTION MAMMAPLASTY    . TONSILLECTOMY AND ADENOIDECTOMY  as child  . TOTAL KNEE ARTHROPLASTY Bilateral 03/17/2013   Procedure: TOTAL KNEE BILATERAL;  Surgeon: Loanne Drilling, MD;  Location: [facility];  Service: Orthopedics;  Laterality: Bilateral;  . URETERAL REIMPLANTION Right 09/13/2014   Procedure: OPEN RIGHT URETERAL REIMPLANT;  Surgeon: Crist Fat, MD;  Location: [facility];  Service: Urology;  Laterality: Right;  Marland Kitchen VAGINAL HYSTERECTOMY  2000   w/ right salpingoophorectomy    Social History   Socioeconomic History  . Marital status: Divorced    Spouse name: Not on file  . Number of children: Not on file  . Years of  education: Not on file  . Highest education level: Not on file  Occupational History  . Not on file  Tobacco Use  . Smoking status: Never Smoker  . Smokeless tobacco: Never Used  Vaping Use  . Vaping Use: Never used  Substance and Sexual Activity  . Alcohol use: No    Alcohol/week: 0.0 standard drinks  . Drug use: No  . Sexual activity: Not on file  Other Topics Concern  . Not on file  Social History Narrative   [place] [facility] [place].  Lives alone in a 2 story home.  Has 3 daughters.     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     Education: college.   Social Determinants of Health   Financial Resource Strain:   . Difficulty of Paying Living Expenses:   Food Insecurity:   . Worried About Programme researcher, broadcasting/film/video in the Last Year:   . Barista in the Last Year:   Transportation Needs:   . Freight forwarder (Medical):   Marland Kitchen Lack of Transportation (Non-Medical):   Physical Activity:   . Days of Exercise per Week:   . Minutes of Exercise per Session:   Stress:   . Feeling of Stress :   Social Connections:   . Frequency of Communication with Friends and Family:   . Frequency of Social Gatherings with Friends and Family:   . Attends Religious Services:   . Active Member of Clubs or Organizations:   . Attends Banker Meetings:   Marland Kitchen Marital Status:     Family History  Problem Relation Age of Onset  . Diabetes Mother        Deceased  . Heart attack Mother   . Dementia Mother   . Hypertension Father        Deceased  . Lung cancer Father   . Cancer Other   . Heart Problems Brother        CABG  . Bipolar disorder Sister   . Healthy Daughter   . Breast cancer Maternal Grandmother     Review of Systems     Objective:  There were no vitals filed for this visit. There were no vitals filed for this visit. There is no height or weight on file to calculate BMI.  BP Readings from Last 3 Encounters:  07/18/19 123/79  07/21/17 (!) 143/91  09/22/15  118/72    Wt Readings from Last 3 Encounters:  07/18/19 190 lb (86.2 kg)  07/21/17 200 lb (90.7 kg)  09/13/14 202 lb (91.6 kg)     Physical Exam Constitutional: She appears well-developed and well-nourished. No distress.  HENT:  Head: Normocephalic and atraumatic.  Right Ear: External ear normal. Normal ear canal and TM Left Ear: External ear normal.  Normal ear canal and TM Mouth/Throat: Oropharynx is clear and moist.  Eyes: Conjunctivae and EOM are normal.  Neck: Neck supple. No tracheal deviation present. No thyromegaly present.  No carotid bruit  Cardiovascular: Normal rate, regular rhythm and normal heart sounds.   No murmur heard.  No edema. Pulmonary/Chest: Effort normal and breath sounds normal. No respiratory distress. She has no wheezes. She has no rales.  Breast: deferred   Abdominal: Soft. She exhibits no distension. There is no tenderness.  Lymphadenopathy: She has no cervical adenopathy.  Skin: Skin is warm and dry. She is not diaphoretic.  Psychiatric: She has a normal mood and affect. Her behavior is normal.        Assessment & Plan:   Physical exam: Screening blood work    ordered Immunizations  Pneumovax today had covid, advised flu,  Colonoscopy  - Dr Loreta Ave, up to date - will get report Mammogram  Up to date  Gyn  Up to date  Dexa  Up to date  Eye exams   Exercise   Weight   Substance abuse  none  See Problem List for Assessment and Plan of chronic medical problems.   This visit occurred during the SARS-CoV-2 public health emergency.  Safety protocols were in place, including screening questions prior to the visit, additional usage of staff PPE, and extensive cleaning of exam room while observing appropriate contact time as indicated for disinfecting solutions.    This encounter was created in error - please disregard.

## 2020-05-10 ENCOUNTER — Other Ambulatory Visit: Payer: Self-pay

## 2020-05-10 ENCOUNTER — Encounter: Payer: Medicare HMO | Admitting: Internal Medicine

## 2020-05-23 ENCOUNTER — Ambulatory Visit: Payer: Medicare HMO | Attending: Internal Medicine

## 2020-05-23 DIAGNOSIS — Z23 Encounter for immunization: Secondary | ICD-10-CM

## 2020-05-23 NOTE — Progress Notes (Signed)
   Covid-19 Vaccination Clinic  Name:  Jennifer Sanford    MRN: [id number] DOB: December 02, 1950  05/23/2020  [name] was observed post Covid-19 immunization for 15 minutes without incident. She was provided with Vaccine Information Sheet and instruction to access the V-Safe system.   [name] was instructed to call 911 with any severe reactions post vaccine: Marland Kitchen Difficulty breathing  . Swelling of face and throat  . A fast heartbeat  . A bad rash all over body  . Dizziness and weakness

## 2020-06-05 ENCOUNTER — Telehealth: Payer: Self-pay | Admitting: Internal Medicine

## 2020-06-05 MED ORDER — FENOFIBRATE 160 MG PO TABS: 160.0000 mg | ORAL_TABLET | Freq: Every morning | ORAL | 3 refills | Status: DC

## 2020-06-05 MED ORDER — CITALOPRAM HYDROBROMIDE 20 MG PO TABS: 20.0000 mg | ORAL_TABLET | Freq: Every morning | ORAL | 3 refills | Status: DC

## 2020-06-05 MED ORDER — TRIAMTERENE-HCTZ 37.5-25 MG PO TABS: 0.5000 | ORAL_TABLET | Freq: Every day | ORAL | 3 refills | Status: DC

## 2020-06-05 MED FILL — TRIAMTERENE-HCTZ 37.5-25 MG: 37.5-25 | 90 days supply | Qty: 45 | Fill #0

## 2020-06-05 NOTE — Telephone Encounter (Signed)
Patient is requesting a refill on the following medication :  citalopram (CELEXA) 20 MG tablet  & fenofibrate 160 MG tablet [facility] - [place], [place] - [address]  [address] [place] [address]  Phone:  [phone] Fax:  [phone]   This RX needs to go to [facility] pharmacy : triamterene-hydrochlorothiazide Joseph Pierini) 37.5-25 MG tablet  [facility] - [place], [place] - [address] [facility] [address]. Phone:  [phone]  Fax:  [phone]

## 2020-06-05 NOTE — Telephone Encounter (Signed)
Reviewed chart pt is up-to-date sent refills to [facility].../lmb  

## 2020-06-06 ENCOUNTER — Other Ambulatory Visit: Payer: Self-pay | Admitting: Internal Medicine

## 2020-06-27 NOTE — Patient Instructions (Addendum)
Blood work was ordered.    All other Health Maintenance issues reviewed.   All recommended immunizations and age-appropriate screenings are up-to-date or discussed.  pneumovax immunization administered today.   Medications reviewed and updated.  Changes include :  Stop dexilant - start protonix, pepcid.     Your prescription(s) have been submitted to your pharmacy. Please take as directed and contact our office if you believe you are having problem(s) with the medication(s).    Please followup in 1 year    Health Maintenance, Female Adopting a healthy lifestyle and getting preventive care are important in promoting health and wellness. Ask your health care provider about:  The right schedule for you to have regular tests and exams.  Things you can do on your own to prevent diseases and keep yourself healthy. What should I know about diet, weight, and exercise? Eat a healthy diet   Eat a diet that includes plenty of vegetables, fruits, low-fat dairy products, and lean protein.  Do not eat a lot of foods that are high in solid fats, added sugars, or sodium. Maintain a healthy weight Body mass index (BMI) is used to identify weight problems. It estimates body fat based on height and weight. Your health care provider can help determine your BMI and help you achieve or maintain a healthy weight. Get regular exercise Get regular exercise. This is one of the most important things you can do for your health. Most adults should:  Exercise for at least 150 minutes each week. The exercise should increase your heart rate and make you sweat (moderate-intensity exercise).  Do strengthening exercises at least twice a week. This is in addition to the moderate-intensity exercise.  Spend less time sitting. Even light physical activity can be beneficial. Watch cholesterol and blood lipids Have your blood tested for lipids and cholesterol at 67 years of age, then have this test every 5  years. Have your cholesterol levels checked more often if:  Your lipid or cholesterol levels are high.  You are older than 67 years of age.  You are at high risk for heart disease. What should I know about cancer screening? Depending on your health history and family history, you may need to have cancer screening at various ages. This may include screening for:  Breast cancer.  Cervical cancer.  Colorectal cancer.  Skin cancer.  Lung cancer. What should I know about heart disease, diabetes, and high blood pressure? Blood pressure and heart disease  High blood pressure causes heart disease and increases the risk of stroke. This is more likely to develop in people who have high blood pressure readings, are of African descent, or are overweight.  Have your blood pressure checked: ? Every 3-5 years if you are 15-24 years of age. ? Every year if you are 50 years old or older. Diabetes Have regular diabetes screenings. This checks your fasting blood sugar level. Have the screening done:  Once every three years after age 87 if you are at a normal weight and have a low risk for diabetes.  More often and at a younger age if you are overweight or have a high risk for diabetes. What should I know about preventing infection? Hepatitis B If you have a higher risk for hepatitis B, you should be screened for this virus. Talk with your health care provider to find out if you are at risk for hepatitis B infection. Hepatitis C Testing is recommended for:  Everyone born from [birth year] through 1965.  Anyone with known risk factors for hepatitis C. Sexually transmitted infections (STIs)  Get screened for STIs, including gonorrhea and chlamydia, if: ? You are sexually active and are younger than 67 years of age. ? You are older than 67 years of age and your health care provider tells you that you are at risk for this type of infection. ? Your sexual activity has changed since you were last  screened, and you are at increased risk for chlamydia or gonorrhea. Ask your health care provider if you are at risk.  Ask your health care provider about whether you are at high risk for HIV. Your health care provider may recommend a prescription medicine to help prevent HIV infection. If you choose to take medicine to prevent HIV, you should first get tested for HIV. You should then be tested every 3 months for as long as you are taking the medicine. Pregnancy  If you are about to stop having your period (premenopausal) and you may become pregnant, seek counseling before you get pregnant.  Take 400 to 800 micrograms (mcg) of folic acid every day if you become pregnant.  Ask for birth control (contraception) if you want to prevent pregnancy. Osteoporosis and menopause Osteoporosis is a disease in which the bones lose minerals and strength with aging. This can result in bone fractures. If you are 60 years old or older, or if you are at risk for osteoporosis and fractures, ask your health care provider if you should:  Be screened for bone loss.  Take a calcium or vitamin D supplement to lower your risk of fractures.  Be given hormone replacement therapy (HRT) to treat symptoms of menopause. Follow these instructions at home: Lifestyle  Do not use any products that contain nicotine or tobacco, such as cigarettes, e-cigarettes, and chewing tobacco. If you need help quitting, ask your health care provider.  Do not use street drugs.  Do not share needles.  Ask your health care provider for help if you need support or information about quitting drugs. Alcohol use  Do not drink alcohol if: ? Your health care provider tells you not to drink. ? You are pregnant, may be pregnant, or are planning to become pregnant.  If you drink alcohol: ? Limit how much you use to 0-1 drink a day. ? Limit intake if you are breastfeeding.  Be aware of how much alcohol is in your drink. In the [place] one  drink equals one 12 oz bottle of beer (355 mL), one 5 oz glass of wine (148 mL), or one 1 oz glass of hard liquor (44 mL). General instructions  Schedule regular health, dental, and eye exams.  Stay current with your vaccines.  Tell your health care provider if: ? You often feel depressed. ? You have ever been abused or do not feel safe at home. Summary  Adopting a healthy lifestyle and getting preventive care are important in promoting health and wellness.  Follow your health care provider's instructions about healthy diet, exercising, and getting tested or screened for diseases.  Follow your health care provider's instructions on monitoring your cholesterol and blood pressure. This information is not intended to replace advice given to you by your health care provider. Make sure you discuss any questions you have with your health care provider. Document Revised: 09/02/2018 Document Reviewed: 09/02/2018 Elsevier Patient Education  2020 [employer].

## 2020-06-27 NOTE — Progress Notes (Signed)
Subjective:    Patient ID: Jennifer Sanford, female    DOB: Mar 07, 1951, 69 y.o.   MRN: [id number]   This visit occurred during the SARS-CoV-2 public health emergency.  Safety protocols were in place, including screening questions prior to the visit, additional usage of staff PPE, and extensive cleaning of exam room while observing appropriate contact time as indicated for disinfecting solutions.  HPI She is here for a physical exam.   Her feel like they are weighted all the time - before they swell during the day.  She denies N/T or burning.  They feel like they are going to swell before they swell.  Her whole body swells.  She is compliant with a low-sodium diet and she takes her Maxide daily.  She is on her feet a lot.  She does wear good footwear.  She has bilateral knee arthritis, which is likely contributing some to her swelling.  Her hands are tingly when she first wakes up.  They improve after moving them.  No weakness.  She denies neck pain.  Left hand is the whole hand tingling.  She has a wrist brace she wears for long road trips.   Medications and allergies reviewed with patient and updated if appropriate.  Patient Active Problem List   Diagnosis Date Noted  . Prediabetes 04/29/2018  . Hypertriglyceridemia 03/10/2018  . Abdominal distension (gaseous) 03/02/2018  . Right upper quadrant pain 03/02/2018  . Renal insufficiency 07/22/2017  . Essential hypertension, benign 01/29/2016  . Anxiety 01/29/2016  . Depression 01/29/2016  . GERD (gastroesophageal reflux disease) 01/29/2016  . 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 S/P ureteral reimplantation 09/13/2014  . S/P bilateral unicompartmental knee replacement 03/22/2013  . OA (osteoarthritis) of knee 03/17/2013  . PES PLANUS 08/28/2009    Current Outpatient Medications on File Prior to Visit  Medication Sig Dispense Refill  . B Complex Vitamins (B COMPLEX 50) TABS daily  0  . Biotin 1000 MCG tablet     .  Calcium Carb-Cholecalciferol (CALCIUM 600 + D PO)     . Cholecalciferol (VITAMIN D3) 1000 units CAPS daily 60 capsule   . citalopram (CELEXA) 20 MG tablet Take 1 tablet (20 mg total) by mouth every morning. 90 tablet 3  . fenofibrate 160 MG tablet Take 1 tablet (160 mg total) by mouth every morning. 90 tablet 3  . triamterene-hydrochlorothiazide (MAXZIDE-25) 37.5-25 MG tablet TAKE 1/2 TABLET AT BEDTIME 45 tablet 3  . Zinc 50 MG CAPS      No current facility-administered medications on file prior to visit.    Past Medical History:  Diagnosis Date  . Complication of anesthesia    VERY CLAUSTROPHIC W/ MASK  . Depression   . GERD (gastroesophageal reflux disease)   . 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 Hx of transfusion of packed red blood cells   . Hyperlipidemia   . Hypertension    no meds s/p wt loss  . OA (osteoarthritis)   . Postop Transfusion 03/21/2013  . Postoperative wound infection     Past Surgical History:  Procedure Laterality Date  . BREAST REDUCTION SURGERY Bilateral 04-12-2008  . CARDIOVASCULAR STRESS TEST  12-26-2009   Normal nuclear study/  no ischemia/  ef 65%  . CYSTO/  RIGHT RETROGRADE PYELOGRAM/  BALLOON DILATION RIGHT URETER STRICTURE/  STENT PLACEMENT  05-25-2010//   03-14-2009//   01-28-2009  . CYSTOSCOPY WITH STENT PLACEMENT Right 02/18/2014  Procedure: CYSTOSCOPY WITH right STENT PLACEMENT;  Surgeon: Danae Chen, MD;  Location: [facility];  Service: Urology;  Laterality: Right;  . EXPLORATORY LAPAROTOMY W/ RIGHT URETER REPAIR  2000  . INCISION AND DRAINAGE ABSCESS N/A 11/28/2014   Procedure: INCISION AND DRAINAGE ABSCESS;  Surgeon: Crist Fat, MD;  Location: [facility];  Service: Urology;  Laterality: N/A;  . INCISION AND DRAINAGE ABSCESS N/A 02/15/2015   Procedure: INCISION AND DRAINAGE OF WOUND;  Surgeon: Crist Fat, MD;  Location: [facility];   Service: Urology;  Laterality: N/A;  . KNEE ARTHROSCOPY  left  2004//   bilateral 1980  . KNEE CLOSED REDUCTION Bilateral 06/02/2013   Procedure: CLOSED MANIPULATION BILATERAL KNEES;  Surgeon: Loanne Drilling, MD;  Location: [facility];  Service: Orthopedics;  Laterality: Bilateral;  . REDUCTION MAMMAPLASTY    . TONSILLECTOMY AND ADENOIDECTOMY  as child  . TOTAL KNEE ARTHROPLASTY Bilateral 03/17/2013   Procedure: TOTAL KNEE BILATERAL;  Surgeon: Loanne Drilling, MD;  Location: [facility];  Service: Orthopedics;  Laterality: Bilateral;  . URETERAL REIMPLANTION Right 09/13/2014   Procedure: OPEN RIGHT URETERAL REIMPLANT;  Surgeon: Crist Fat, MD;  Location: [facility];  Service: Urology;  Laterality: Right;  Marland Kitchen VAGINAL HYSTERECTOMY  2000   w/ right salpingoophorectomy    Social History   Socioeconomic History  . Marital status: Divorced    Spouse name: Not on file  . Number of children: Not on file  . Years of education: Not on file  . Highest education level: Not on file  Occupational History  . Not on file  Tobacco Use  . Smoking status: Never Smoker  . Smokeless tobacco: Never Used  Vaping Use  . Vaping Use: Never used  Substance and Sexual Activity  . Alcohol use: No    Alcohol/week: 0.0 standard drinks  . Drug use: No  . Sexual activity: Not on file  Other Topics Concern  . Not on file  Social History Narrative   [place] [facility] [place].  Lives alone in a 2 story home.  Has 3 daughters.     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     Education: college.   Social Determinants of Health   Financial Resource Strain:   . Difficulty of Paying Living Expenses: Not on file  Food Insecurity:   . Worried About Programme researcher, broadcasting/film/video in the Last Year: Not on file  . Ran Out of Food in the Last Year: Not on file  Transportation Needs:   . Lack of Transportation (Medical): Not on file  . Lack of Transportation (Non-Medical): Not on file  Physical Activity:   . Days of Exercise per  Week: Not on file  . Minutes of Exercise per Session: Not on file  Stress:   . Feeling of Stress : Not on file  Social Connections:   . Frequency of Communication with Friends and Family: Not on file  . Frequency of Social Gatherings with Friends and Family: Not on file  . Attends Religious Services: Not on file  . Active Member of Clubs or Organizations: Not on file  . Attends Banker Meetings: Not on file  . Marital Status: Not on file    Family History  Problem Relation Age of Onset  . Diabetes Mother        Deceased  . Heart attack Mother   . Dementia Mother   .  Hypertension Father        Deceased  . Lung cancer Father   . Cancer Other   . Heart Problems Brother        CABG  . Bipolar disorder Sister   . Healthy Daughter   . Breast cancer Maternal Grandmother     Review of Systems  Constitutional: Negative for chills and fever.  Eyes: Negative for visual disturbance.  Respiratory: Negative for cough, shortness of breath and wheezing.   Cardiovascular: Positive for leg swelling (likely from b/l knee OA and stands a lot). Negative for chest pain and palpitations.  Gastrointestinal: Negative for abdominal pain, blood in stool, constipation, diarrhea and nausea.  Genitourinary: Negative for dysuria and hematuria.  Musculoskeletal: Positive for arthralgias (knees) and back pain (occ, goes away with exercise).  Skin: Negative for color change and rash.  Neurological: Negative for dizziness, light-headedness and headaches.  Psychiatric/Behavioral: The patient is not nervous/anxious.        Objective:   Vitals:   06/28/20 0906  BP: 124/76  Pulse: 76  Temp: 98.1 F (36.7 C)  SpO2: 97%   Filed Weights   06/28/20 0906  Weight: 211 lb (95.7 kg)   Body mass index is 29.43 kg/m.  BP Readings from Last 3 Encounters:  06/28/20 124/76  07/18/19 123/79  07/21/17 (!) 143/91    Wt Readings from Last 3 Encounters:  06/28/20 211 lb (95.7 kg)  07/18/19  190 lb (86.2 kg)  07/21/17 200 lb (90.7 kg)   Depression screen PHQ 2/9 06/28/2020  Decreased Interest 0  Down, Depressed, Hopeless 0  PHQ - 2 Score 0  Some encounter information is confidential and restricted. Go to Review Flowsheets activity to see all data.     Physical Exam Constitutional: She appears well-developed and well-nourished. No distress.  HENT:  Head: Normocephalic and atraumatic.  Right Ear: External ear normal. Normal ear canal and TM Left Ear: External ear normal.  Normal ear canal and TM Mouth/Throat: Oropharynx is clear and moist.  Eyes: Conjunctivae and EOM are normal.  Neck: Neck supple. No tracheal deviation present. No thyromegaly present.  No carotid bruit  Cardiovascular: Normal rate, regular rhythm and normal heart sounds.   No murmur heard.  No edema. Pulmonary/Chest: Effort normal and breath sounds normal. No respiratory distress. She has no wheezes. She has no rales.  Breast: deferred   Abdominal: Soft. She exhibits no distension. There is no tenderness.  Lymphadenopathy: She has no cervical adenopathy.  Skin: Skin is warm and dry. She is not diaphoretic.  Psychiatric: She has a normal mood and affect. Her behavior is normal.        Assessment & Plan:   Physical exam: Screening blood work    ordered Immunizations  Flu will get it this month, pneumovax today, had covid Colonoscopy  Due - 2022 - Dr Loreta Ave Mammogram  Up to date  Gyn  Up to date  - Dr Cherly Hensen Dexa Up to date  Eye exams  Up to date  Exercise  Walking, working out [employer]  Will work on [employer] loss Substance abuse  none    Screened for depression using the PHQ 2 scale.  No evidence of depression.     See Problem List for Assessment and Plan of chronic medical problems.

## 2020-06-28 ENCOUNTER — Other Ambulatory Visit: Payer: Self-pay

## 2020-06-28 ENCOUNTER — Ambulatory Visit (INDEPENDENT_AMBULATORY_CARE_PROVIDER_SITE_OTHER): Payer: Medicare HMO | Admitting: Internal Medicine

## 2020-06-28 ENCOUNTER — Encounter: Payer: Self-pay | Admitting: Internal Medicine

## 2020-06-28 VITALS — BP 124/76 | HR 76 | Temp 98.1°F | Ht 71.0 in | Wt 211.0 lb

## 2020-06-28 DIAGNOSIS — K219 Gastro-esophageal reflux disease without esophagitis: Secondary | ICD-10-CM

## 2020-06-28 DIAGNOSIS — R7303 Prediabetes: Secondary | ICD-10-CM

## 2020-06-28 DIAGNOSIS — Z23 Encounter for immunization: Secondary | ICD-10-CM | POA: Diagnosis not present

## 2020-06-28 DIAGNOSIS — F419 Anxiety disorder, unspecified: Secondary | ICD-10-CM

## 2020-06-28 DIAGNOSIS — N289 Disorder of kidney and ureter, unspecified: Secondary | ICD-10-CM | POA: Diagnosis not present

## 2020-06-28 DIAGNOSIS — F3289 Other specified depressive episodes: Secondary | ICD-10-CM | POA: Diagnosis not present

## 2020-06-28 DIAGNOSIS — I10 Essential (primary) hypertension: Secondary | ICD-10-CM

## 2020-06-28 DIAGNOSIS — R202 Paresthesia of skin: Secondary | ICD-10-CM

## 2020-06-28 DIAGNOSIS — E781 Pure hyperglyceridemia: Secondary | ICD-10-CM | POA: Diagnosis not present

## 2020-06-28 DIAGNOSIS — Z0000 Encounter for general adult medical examination without abnormal findings: Secondary | ICD-10-CM | POA: Diagnosis not present

## 2020-06-28 DIAGNOSIS — G5600 Carpal tunnel syndrome, unspecified upper limb: Secondary | ICD-10-CM | POA: Insufficient documentation

## 2020-06-28 LAB — CBC WITH DIFFERENTIAL/PLATELET
Basophils Absolute: 0 10*3/uL (ref 0.0–0.1)
Basophils Relative: 0.4 % (ref 0.0–3.0)
Eosinophils Absolute: 0.1 10*3/uL (ref 0.0–0.7)
Eosinophils Relative: 1.6 % (ref 0.0–5.0)
HCT: 37 % (ref 36.0–46.0)
Hemoglobin: 12.2 g/dL (ref 12.0–15.0)
Lymphocytes Relative: 49.8 % — ABNORMAL HIGH (ref 12.0–46.0)
Lymphs Abs: 3 10*3/uL (ref 0.7–4.0)
MCHC: 33.1 g/dL (ref 30.0–36.0)
MCV: 90 fl (ref 78.0–100.0)
Monocytes Absolute: 0.6 10*3/uL (ref 0.1–1.0)
Monocytes Relative: 9.3 % (ref 3.0–12.0)
Neutro Abs: 2.3 10*3/uL (ref 1.4–7.7)
Neutrophils Relative %: 38.9 % — ABNORMAL LOW (ref 43.0–77.0)
Platelets: 294 10*3/uL (ref 150.0–400.0)
RBC: 4.11 Mil/uL (ref 3.87–5.11)
RDW: 14.5 % (ref 11.5–15.5)
WBC: 6 10*3/uL (ref 4.0–10.5)

## 2020-06-28 LAB — COMPREHENSIVE METABOLIC PANEL
ALT: 17 U/L (ref 0–35)
AST: 22 U/L (ref 0–37)
Albumin: 3.9 g/dL (ref 3.5–5.2)
Alkaline Phosphatase: 57 U/L (ref 39–117)
BUN: 34 mg/dL — ABNORMAL HIGH (ref 6–23)
CO2: 31 mEq/L (ref 19–32)
Calcium: 9.6 mg/dL (ref 8.4–10.5)
Chloride: 103 mEq/L (ref 96–112)
Creatinine, Ser: 1.09 mg/dL (ref 0.40–1.20)
GFR: 52.49 mL/min — ABNORMAL LOW (ref >60.00)
Glucose, Bld: 68 mg/dL — ABNORMAL LOW (ref 70–99)
Potassium: 4.3 mEq/L (ref 3.5–5.1)
Sodium: 139 mEq/L (ref 135–145)
Total Bilirubin: 0.5 mg/dL (ref 0.2–1.2)
Total Protein: 7.1 g/dL (ref 6.0–8.3)

## 2020-06-28 LAB — LIPID PANEL
Cholesterol: 210 mg/dL — ABNORMAL HIGH (ref 0–200)
HDL: 64.8 mg/dL (ref >39.00)
LDL Cholesterol: 132 mg/dL — ABNORMAL HIGH (ref 0–99)
NonHDL: 145.41
Total CHOL/HDL Ratio: 3
Triglycerides: 68 mg/dL (ref 0.0–149.0)
VLDL: 13.6 mg/dL (ref 0.0–40.0)

## 2020-06-28 LAB — HEMOGLOBIN A1C: Hgb A1c MFr Bld: 5.8 % (ref 4.6–6.5)

## 2020-06-28 LAB — TSH: TSH: 0.79 u[IU]/mL (ref 0.35–4.50)

## 2020-06-28 MED ORDER — PANTOPRAZOLE SODIUM 40 MG PO TBEC: 40.0000 mg | DELAYED_RELEASE_TABLET | Freq: Two times a day (BID) | ORAL | 3 refills | Status: DC

## 2020-06-28 MED ORDER — FAMOTIDINE 40 MG PO TABS: 40.0000 mg | ORAL_TABLET | Freq: Two times a day (BID) | ORAL | 3 refills | Status: DC

## 2020-06-28 NOTE — Assessment & Plan Note (Addendum)
Chronic No advil or alevel Drinks a lot of fluilds CMP

## 2020-06-28 NOTE — Assessment & Plan Note (Signed)
Chronic She typically feels this when she first wakes up They do improve during the day Does have carpal tunnel so this could be related to that more possibly cervical radiculopathy At this point she does not want to evaluate further Advised trying bilateral wrist brace to see if that helps

## 2020-06-28 NOTE — Assessment & Plan Note (Signed)
Chronic Has been controlled Check lipid panel  Continue fenofibrate 160 mg daily Regular exercise and healthy diet encouraged

## 2020-06-28 NOTE — Assessment & Plan Note (Signed)
Chronic Controlled, stable Continue citalopram 20 mg daily  

## 2020-06-28 NOTE — Assessment & Plan Note (Signed)
Chronic Controlled, stable on dexilant 30 mg daily but medication is expensive so will try an alternative The equivalent to Dexilant is Protonix 40 mg twice daily and Pepcid 40 mg twice daily She will start that regimen and let me know if it is not effective

## 2020-06-28 NOTE — Assessment & Plan Note (Signed)
Chronic BP well controlled Continue maxzide 37.5-25 mg daily cmp

## 2020-06-28 NOTE — Assessment & Plan Note (Signed)
Chronic Check a1c Low sugar / carb diet Stressed regular exercise  

## 2020-08-04 ENCOUNTER — Encounter: Payer: Self-pay | Admitting: Internal Medicine

## 2020-08-04 NOTE — Progress Notes (Unsigned)
Outside notes received. Information abstracted. Notes sent to scan.  

## 2020-09-27 ENCOUNTER — Encounter: Payer: Self-pay | Admitting: Internal Medicine

## 2021-01-11 ENCOUNTER — Emergency Department (HOSPITAL_BASED_OUTPATIENT_CLINIC_OR_DEPARTMENT_OTHER): Payer: Medicare HMO

## 2021-01-11 ENCOUNTER — Other Ambulatory Visit: Payer: Self-pay

## 2021-01-11 ENCOUNTER — Emergency Department (HOSPITAL_BASED_OUTPATIENT_CLINIC_OR_DEPARTMENT_OTHER)
Admission: EM | Admit: 2021-01-11 | Discharge: 2021-01-11 | Disposition: A | Discharge status: Home / Self Care | Payer: Medicare HMO | Attending: Emergency Medicine | Admitting: Emergency Medicine

## 2021-01-11 ENCOUNTER — Encounter (HOSPITAL_BASED_OUTPATIENT_CLINIC_OR_DEPARTMENT_OTHER): Payer: Self-pay | Admitting: Emergency Medicine

## 2021-01-11 DIAGNOSIS — I10 Essential (primary) hypertension: Secondary | ICD-10-CM | POA: Insufficient documentation

## 2021-01-11 DIAGNOSIS — Z96653 Presence of artificial knee joint, bilateral: Secondary | ICD-10-CM | POA: Insufficient documentation

## 2021-01-11 DIAGNOSIS — W19XXXA Unspecified fall, initial encounter: Secondary | ICD-10-CM | POA: Diagnosis not present

## 2021-01-11 DIAGNOSIS — R42 Dizziness and giddiness: Secondary | ICD-10-CM | POA: Diagnosis not present

## 2021-01-11 DIAGNOSIS — W01198A Fall on same level from slipping, tripping and stumbling with subsequent striking against other object, initial encounter: Secondary | ICD-10-CM | POA: Insufficient documentation

## 2021-01-11 DIAGNOSIS — S0990XA Unspecified injury of head, initial encounter: Secondary | ICD-10-CM | POA: Diagnosis not present

## 2021-01-11 DIAGNOSIS — Z79899 Other long term (current) drug therapy: Secondary | ICD-10-CM | POA: Insufficient documentation

## 2021-01-11 DIAGNOSIS — R0902 Hypoxemia: Secondary | ICD-10-CM | POA: Diagnosis not present

## 2021-01-11 DIAGNOSIS — S060X0A Concussion without loss of consciousness, initial encounter: Secondary | ICD-10-CM | POA: Diagnosis not present

## 2021-01-11 DIAGNOSIS — Z043 Encounter for examination and observation following other accident: Secondary | ICD-10-CM | POA: Diagnosis not present

## 2021-01-11 MED ORDER — ACETAMINOPHEN 325 MG PO TABS
650.0000 mg | ORAL_TABLET | Freq: Once | ORAL | Status: AC
  Administered 2021-01-11: 650 mg via ORAL
  Filled 2021-01-11: qty 2

## 2021-01-11 NOTE — ED Triage Notes (Addendum)
Pt from home , fall today , slipped and landed backward on tiles floor, headache . Dizziness post fall. denies LOC . Left elbow pain. Alert and oriented x 4.

## 2021-01-11 NOTE — Discharge Instructions (Addendum)
Take over-the-counter medications as needed for headache.  Follow-up with your doctor next week if the symptoms have not resolved.  Return to the ED as needed for trouble with severe headache, confusion, vomiting

## 2021-01-11 NOTE — ED Provider Notes (Signed)
[facility] EMERGENCY DEPT Provider Note   CSN: [id number] Arrival date & time: 01/11/21  2355     History Chief Complaint  Patient presents with  . Fall    Jennifer Sanford is a 70 y.o. female.  HPI   Patient presents to the ED for evaluation of head injury and dizziness.  Patient was spraying disinfectant when she slipped on the tile floor.  She thinks some of it must of gotten on the floor making it slippery.  She ended up falling backwards hitting her head.  Immediately patient developed a headache and started to feel lightheaded and dizzy.  She has not vomited.  She is not having neck pain.  She does not have any focal numbness or weakness.  Patient does also complain of some mild soreness in her elbow.  She thinks she must of landed on it but she is not have any difficulty moving it and she has not noticed any swelling.  Past Medical History:  Diagnosis Date  . Complication of anesthesia    VERY CLAUSTROPHIC W/ MASK  . Depression   . GERD (gastroesophageal reflux disease)   . 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 Hx of transfusion of packed red blood cells   . Hyperlipidemia   . Hypertension    no meds s/p wt loss  . OA (osteoarthritis)   . Postop Transfusion 03/21/2013  . Postoperative wound infection     Patient Active Problem List   Diagnosis Date Noted  . Hand tingling 06/28/2020  . Prediabetes 04/29/2018  . Hypertriglyceridemia 03/10/2018  . Right upper quadrant pain 03/02/2018  . Renal insufficiency 07/22/2017  . Essential hypertension, benign 01/29/2016  . Anxiety 01/29/2016  . Depression 01/29/2016  . GERD (gastroesophageal reflux disease) 01/29/2016  . 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 S/P ureteral reimplantation 09/13/2014  . S/P bilateral unicompartmental knee replacement 03/22/2013  . OA (osteoarthritis) of knee 03/17/2013  . PES PLANUS 08/28/2009    Past Surgical History:   Procedure Laterality Date  . BREAST REDUCTION SURGERY Bilateral 04-12-2008  . CARDIOVASCULAR STRESS TEST  12-26-2009   Normal nuclear study/  no ischemia/  ef 65%  . CYSTO/  RIGHT RETROGRADE PYELOGRAM/  BALLOON DILATION RIGHT URETER STRICTURE/  STENT PLACEMENT  05-25-2010//   03-14-2009//   01-28-2009  . CYSTOSCOPY WITH STENT PLACEMENT Right 02/18/2014   Procedure: CYSTOSCOPY WITH right STENT PLACEMENT;  Surgeon: Danae Chen, MD;  Location: [facility];  Service: Urology;  Laterality: Right;  . EXPLORATORY LAPAROTOMY W/ RIGHT URETER REPAIR  2000  . INCISION AND DRAINAGE ABSCESS N/A 11/28/2014   Procedure: INCISION AND DRAINAGE ABSCESS;  Surgeon: Crist Fat, MD;  Location: [facility];  Service: Urology;  Laterality: N/A;  . INCISION AND DRAINAGE ABSCESS N/A 02/15/2015   Procedure: INCISION AND DRAINAGE OF WOUND;  Surgeon: Crist Fat, MD;  Location: [facility];  Service: Urology;  Laterality: N/A;  . KNEE ARTHROSCOPY  left  2004//   bilateral 1980  . KNEE CLOSED REDUCTION Bilateral 06/02/2013   Procedure: CLOSED MANIPULATION BILATERAL KNEES;  Surgeon: Loanne Drilling, MD;  Location: [facility];  Service: Orthopedics;  Laterality: Bilateral;  . REDUCTION MAMMAPLASTY    . TONSILLECTOMY AND ADENOIDECTOMY  as child  . TOTAL KNEE ARTHROPLASTY Bilateral 03/17/2013   Procedure: TOTAL KNEE BILATERAL;  Surgeon: Loanne Drilling, MD;  Location: [facility];  Service: Orthopedics;  Laterality: Bilateral;  .  URETERAL REIMPLANTION Right 09/13/2014   Procedure: OPEN RIGHT URETERAL REIMPLANT;  Surgeon: Crist FatBenjamin W Herrick, MD;  Location: [facility];  Service: Urology;  Laterality: Right;  Marland Kitchen. VAGINAL HYSTERECTOMY  2000   w/ right salpingoophorectomy     OB History   No obstetric history on file.     Family History  Problem Relation Age of Onset  . Diabetes Mother        Deceased  . Heart attack Mother   . Dementia Mother   . Hypertension Father         Deceased  . Lung cancer Father   . Cancer Other   . Heart Problems Brother        CABG  . Bipolar disorder Sister   . Healthy Daughter   . Breast cancer Maternal Grandmother     Social History   Tobacco Use  . Smoking status: Never Smoker  . Smokeless tobacco: Never Used  Vaping Use  . Vaping Use: Never used  Substance Use Topics  . Alcohol use: No    Alcohol/week: 0.0 standard drinks  . Drug use: No    Home Medications Prior to Admission medications   Medication Sig Start Date End Date Taking? Authorizing Provider  B Complex Vitamins (B COMPLEX 50) TABS daily 01/29/16   Burns, Bobette MoStacy J, MD  Biotin 1000 MCG tablet     [provider]  Calcium Carb-Cholecalciferol (CALCIUM 600 + D PO)     [provider]  Cholecalciferol (VITAMIN D3) 1000 units CAPS daily 01/29/16   Pincus SanesBurns, Stacy J, MD  citalopram (CELEXA) 20 MG tablet Take 1 tablet (20 mg total) by mouth every morning. 06/05/20   Pincus SanesBurns, Stacy J, MD  famotidine (PEPCID) 40 MG tablet Take 1 tablet (40 mg total) by mouth 2 (two) times daily. 06/28/20   Pincus SanesBurns, Stacy J, MD  fenofibrate 160 MG tablet Take 1 tablet (160 mg total) by mouth every morning. 06/05/20   Pincus SanesBurns, Stacy J, MD  pantoprazole (PROTONIX) 40 MG tablet Take 1 tablet (40 mg total) by mouth 2 (two) times daily before a meal. 06/28/20   Burns, Bobette MoStacy J, MD  triamterene-hydrochlorothiazide (MAXZIDE-25) 37.5-25 MG tablet TAKE 1/2 TABLET AT BEDTIME 06/06/20   Pincus SanesBurns, Stacy J, MD  Zinc 50 MG CAPS     [provider]    Allergies    Advil [ibuprofen], Mixed feathers, and Shrimp [shellfish allergy]  Review of Systems   Review of Systems  All other systems reviewed and are negative.   Physical Exam Updated Vital Signs BP (!) 121/99 (BP Location: Right Arm)   Pulse (!) 58   Temp 97.9 F (36.6 C) (Oral)   Resp 15   SpO2 100%   Physical Exam Vitals and nursing note reviewed.  Constitutional:      General: She is not in acute distress.     Appearance: Normal appearance. She is well-developed. She is not diaphoretic.  HENT:     Head: Normocephalic and atraumatic. No raccoon eyes or Battle's sign.     Right Ear: External ear normal.     Left Ear: External ear normal.  Eyes:     General: Lids are normal.        Right eye: No discharge.     Conjunctiva/sclera:     Right eye: No hemorrhage.    Left eye: No hemorrhage. Neck:     Trachea: No tracheal deviation.  Cardiovascular:     Rate and Rhythm: Normal rate and regular rhythm.  Heart sounds: Normal heart sounds.  Pulmonary:     Effort: Pulmonary effort is normal. No respiratory distress.     Breath sounds: Normal breath sounds. No stridor.  Chest:     Chest wall: No deformity, tenderness or crepitus.  Abdominal:     General: Bowel sounds are normal. There is no distension.     Palpations: Abdomen is soft. There is no mass.     Tenderness: There is no abdominal tenderness.     Comments:    Musculoskeletal:     Cervical back: No swelling, edema, deformity or tenderness. No spinous process tenderness.     Thoracic back: No swelling, deformity or tenderness.     Lumbar back: No swelling or tenderness.     Comments: Pelvis stable, no ttp  Neurological:     Mental Status: She is alert.     GCS: GCS eye subscore is 4. GCS verbal subscore is 5. GCS motor subscore is 6.     Sensory: No sensory deficit.     Motor: No abnormal muscle tone.     Comments: Able to move all extremities, sensation intact throughout  Psychiatric:        Speech: Speech normal.        Behavior: Behavior normal.     ED Results / Procedures / Treatments   Labs (all labs ordered are listed, but only abnormal results are displayed) Labs Reviewed - No data to display  EKG EKG Interpretation  Date/Time:  Thursday January 11 2021 09:50:08 EDT Ventricular Rate:  54 PR Interval:  158 QRS Duration: 91 QT Interval:  431 QTC Calculation: 409 R Axis:   57 Text Interpretation: Sinus rhythm  Probable left atrial enlargement No old tracing to compare Confirmed by Linwood Dibbles [phone]) on 01/11/2021 9:53:02 AM   Radiology CT Head Wo Contrast  Result Date: 01/11/2021 CLINICAL DATA:  Head trauma moderate to severe.  Fall. EXAM: CT HEAD WITHOUT CONTRAST TECHNIQUE: Contiguous axial images were obtained from the base of the skull through the vertex without intravenous contrast. COMPARISON:  CT head 07/21/2017 FINDINGS: Brain: No evidence of acute infarction, hemorrhage, hydrocephalus, extra-axial collection or mass lesion/mass effect. Vascular: Negative for hyperdense vessel Skull: Negative Sinuses/Orbits: Paranasal sinuses clear.  Negative orbit Other: None IMPRESSION: Negative CT head.  No acute injury. Electronically Signed   By: Marlan Palau M.D.   On: 01/11/2021 10:54    Procedures Procedures   Medications Ordered in ED Medications  acetaminophen (TYLENOL) tablet 650 mg (650 mg Oral Given 01/11/21 1007)    ED Course  I have reviewed the triage vital signs and the nursing notes.  Pertinent labs & imaging results that were available during my care of the patient were reviewed by me and considered in my medical decision making (see chart for details).  Clinical Course as of 01/11/21 1113  Thu Jan 11, 2021  9323 EKG performed per protocol.  No concerns for syncope based on my exam, history [JK]    Clinical Course User Index [JK] Linwood Dibbles, MD   MDM Rules/Calculators/A&P                          Patient presented to the ED for evaluation of headache lightheadedness after hitting her head on the floor.  CT scan does not show any signs of hemorrhage or other acute abnormality.  Patient symptoms are consistent with concussion.  No other signs of serious injury on exam.  Patient is  feeling well.  She was given a dose of Tylenol.  Will discharge home.  Precautions discussed Final Clinical Impression(s) / ED Diagnoses Final diagnoses:  Concussion without loss of consciousness,  initial encounter    Rx / DC Orders ED Discharge Orders    None       Linwood Dibbles, MD 01/11/21 1114

## 2021-01-17 ENCOUNTER — Telehealth (INDEPENDENT_AMBULATORY_CARE_PROVIDER_SITE_OTHER): Payer: Medicare HMO | Admitting: Internal Medicine

## 2021-01-17 ENCOUNTER — Encounter: Payer: Self-pay | Admitting: Internal Medicine

## 2021-01-17 ENCOUNTER — Ambulatory Visit: Payer: Medicare HMO | Admitting: Internal Medicine

## 2021-01-17 DIAGNOSIS — U071 COVID-19: Secondary | ICD-10-CM | POA: Diagnosis not present

## 2021-01-17 DIAGNOSIS — S060X0A Concussion without loss of consciousness, initial encounter: Secondary | ICD-10-CM

## 2021-01-17 DIAGNOSIS — S060XAA Concussion with loss of consciousness status unknown, initial encounter: Secondary | ICD-10-CM | POA: Insufficient documentation

## 2021-01-17 DIAGNOSIS — S060X9A Concussion with loss of consciousness of unspecified duration, initial encounter: Secondary | ICD-10-CM | POA: Insufficient documentation

## 2021-01-17 MED ORDER — HYDROCOD POLST-CPM POLST ER 10-8 MG/5ML PO SUER: 5.0000 mL | Freq: Two times a day (BID) | ORAL | 0 refills | Status: DC | PRN

## 2021-01-17 NOTE — Progress Notes (Signed)
Virtual Visit via Video Note  I connected with Jennifer Sanford on 01/17/21 at 10:20 AM EDT by a video enabled telemedicine application and verified that I am speaking with the correct person using two identifiers.   I discussed the limitations of evaluation and management by telemedicine and the availability of in person appointments. The patient expressed understanding and agreed to proceed.  Present for the visit:  Myself, Dr Cheryll Cockayne, Vance Peper.  The patient is currently at home and I am in the office.    No referring provider.    History of Present Illness: She is here for an acute visit.    covid positive-she tested positive last Friday, 4/22.  She feels she is over the worst part now, but is still having a lot of mucus in her chest, cough that is worse at night and some nasal congestion.  She has not had any more fevers, sore throat and is overall feeling better.  She feels a little winded when she goes upstairs.  She is taking Lawyer and they do not seem to be working and wanted to get some cough syrup.  S/p fall with concussion - she went to the ED 4/21-she was spraying disinfectant when she slipped on the tile floor.  She fell backwards and hit her head.  She immediately had a headache and felt lightheaded/dizzy.  She had not vomited and denied neck pain, numbness or weakness.  She had some mild soreness in her elbow.  CT of the head was done that showed no acute injury.  She was diagnosed with a concussion without loss of consciousness.  Her dizziness has resolved except for when she bends over.  The headaches are improved and intermittent.      Review of Systems  Constitutional: Negative for fever.  HENT: Positive for congestion. Negative for ear pain, sinus pain and sore throat.   Respiratory: Positive for cough (mucus in chest - can not expectorate) and shortness of breath (with activity gets winded). Negative for sputum production and wheezing.    Gastrointestinal: Negative for nausea.  Neurological: Positive for dizziness and headaches. Negative for weakness.     Social History   Socioeconomic History  . Marital status: Divorced    Spouse name: Not on file  . Number of children: Not on file  . Years of education: Not on file  . Highest education level: Not on file  Occupational History  . Not on file  Tobacco Use  . Smoking status: Never Smoker  . Smokeless tobacco: Never Used  Vaping Use  . Vaping Use: Never used  Substance and Sexual Activity  . Alcohol use: No    Alcohol/week: 0.0 standard drinks  . Drug use: No  . Sexual activity: Not on file  Other Topics Concern  . Not on file  Social History Narrative   [place] [facility] [place] [facility] [place].  Lives alone in a 2 story home.  Has 3 daughters.     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     Education: college.   Social Determinants of Health   Financial Resource Strain: Not on file  Food Insecurity: Not on file  Transportation Needs: Not on file  Physical Activity: Not on file  Stress: Not on file  Social Connections: Not on file     Observations/Objective: Appears well in NAD Breathing normally Intermittent wet sounding cough Skin appears warm and dry Alert and orientated, normal mood  Assessment and  Plan:  See Problem List for Assessment and Plan of chronic medical problems.   Follow Up Instructions:    I discussed the assessment and treatment plan with the patient. The patient was provided an opportunity to ask questions and all were answered. The patient agreed with the plan and demonstrated an understanding of the instructions.   The patient was advised to call back or seek an in-person evaluation if the symptoms worsen or if the condition fails to improve as anticipated.    Binnie Rail, MD

## 2021-01-17 NOTE — Assessment & Plan Note (Signed)
Acute COVID positive for/22 Most of her symptoms have improved Left with some nasal congestion, mucus in chest and cough that is worse at night Doing symptomatic treatment Would like prescription cough syrup to help at night-we will prescribe Tussionex 5 mL every 12 hours as needed Symptoms should continue to improve-she will call if they do not or with any concerns

## 2021-01-17 NOTE — Assessment & Plan Note (Signed)
Acute Fell at home and went to the ED 4/21 CT of the head was negative for any acute injury/bleed Is having some intermittent headaches that have improved Dizziness has resolved except for on occasion when she bends over Symptoms improving No other concerning symptoms She will call if her symptoms do not continue to improve or worsen

## 2021-01-24 ENCOUNTER — Telehealth: Payer: Medicare HMO | Admitting: Internal Medicine

## 2021-02-22 DIAGNOSIS — Z90710 Acquired absence of both cervix and uterus: Secondary | ICD-10-CM | POA: Diagnosis not present

## 2021-02-22 DIAGNOSIS — Z01419 Encounter for gynecological examination (general) (routine) without abnormal findings: Secondary | ICD-10-CM | POA: Diagnosis not present

## 2021-02-22 DIAGNOSIS — I10 Essential (primary) hypertension: Secondary | ICD-10-CM | POA: Diagnosis not present

## 2021-02-23 ENCOUNTER — Other Ambulatory Visit (HOSPITAL_COMMUNITY): Payer: Self-pay

## 2021-02-23 DIAGNOSIS — M7541 Impingement syndrome of right shoulder: Secondary | ICD-10-CM | POA: Diagnosis not present

## 2021-02-23 MED ORDER — TRAMADOL HCL 50 MG PO TABS
50.0000 mg | ORAL_TABLET | ORAL | 0 refills | Status: DC
  Filled 2021-02-23: qty 20, 4d supply, fill #0

## 2021-03-01 ENCOUNTER — Other Ambulatory Visit: Payer: Self-pay | Admitting: Orthopedic Surgery

## 2021-03-01 DIAGNOSIS — M25511 Pain in right shoulder: Secondary | ICD-10-CM

## 2021-03-06 ENCOUNTER — Inpatient Hospital Stay: Admission: RE | Admit: 2021-03-06 | Payer: Medicare HMO | Source: Ambulatory Visit

## 2021-03-07 ENCOUNTER — Other Ambulatory Visit (HOSPITAL_COMMUNITY): Payer: Self-pay

## 2021-03-07 ENCOUNTER — Ambulatory Visit
Admission: RE | Admit: 2021-03-07 | Discharge: 2021-03-07 | Disposition: A | Discharge status: Home / Self Care | Payer: Medicare HMO | Source: Ambulatory Visit | Attending: Orthopedic Surgery | Admitting: Orthopedic Surgery

## 2021-03-07 ENCOUNTER — Other Ambulatory Visit: Payer: Self-pay

## 2021-03-07 DIAGNOSIS — M25511 Pain in right shoulder: Secondary | ICD-10-CM | POA: Diagnosis not present

## 2021-03-07 MED ORDER — PREDNISONE 5 MG PO TABS
ORAL_TABLET | ORAL | 0 refills | Status: DC
  Filled 2021-03-07: qty 21, 6d supply, fill #0

## 2021-03-07 MED ORDER — CYCLOBENZAPRINE HCL 10 MG PO TABS
10.0000 mg | ORAL_TABLET | Freq: Three times a day (TID) | ORAL | 0 refills | Status: DC | PRN
  Filled 2021-03-07: qty 30, 10d supply, fill #0

## 2021-03-07 MED ORDER — CYCLOBENZAPRINE HCL 10 MG PO TABS: 10.0000 mg | ORAL_TABLET | ORAL | 0 refills | Status: DC

## 2021-03-12 ENCOUNTER — Other Ambulatory Visit (HOSPITAL_COMMUNITY): Payer: Self-pay

## 2021-03-12 ENCOUNTER — Other Ambulatory Visit: Payer: Self-pay | Admitting: Orthopedic Surgery

## 2021-03-12 DIAGNOSIS — M542 Cervicalgia: Secondary | ICD-10-CM

## 2021-03-17 ENCOUNTER — Other Ambulatory Visit: Payer: Self-pay

## 2021-03-17 ENCOUNTER — Ambulatory Visit
Admission: RE | Admit: 2021-03-17 | Discharge: 2021-03-17 | Disposition: A | Discharge status: Home / Self Care | Payer: Medicare HMO | Source: Ambulatory Visit | Attending: Orthopedic Surgery | Admitting: Orthopedic Surgery

## 2021-03-17 DIAGNOSIS — M4802 Spinal stenosis, cervical region: Secondary | ICD-10-CM | POA: Diagnosis not present

## 2021-03-17 DIAGNOSIS — M542 Cervicalgia: Secondary | ICD-10-CM

## 2021-03-24 DIAGNOSIS — M5412 Radiculopathy, cervical region: Secondary | ICD-10-CM | POA: Diagnosis not present

## 2021-03-27 ENCOUNTER — Other Ambulatory Visit (HOSPITAL_COMMUNITY): Payer: Self-pay

## 2021-03-27 MED ORDER — PREDNISONE 10 MG PO TABS
ORAL_TABLET | ORAL | 0 refills | Status: DC
  Filled 2021-03-27: qty 21, 6d supply, fill #0

## 2021-04-02 DIAGNOSIS — M5412 Radiculopathy, cervical region: Secondary | ICD-10-CM | POA: Diagnosis not present

## 2021-04-03 ENCOUNTER — Telehealth: Payer: Self-pay | Admitting: Internal Medicine

## 2021-04-03 NOTE — Chronic Care Management (AMB) (Signed)
  Chronic Care Management   Outreach Note  04/03/2021 Name: Jennifer Sanford MRN: [id number] DOB: 08/09/1951  Referred by: Pincus Sanes, MD Reason for referral : No chief complaint on file.   A second unsuccessful telephone outreach was attempted today. The patient was referred to pharmacist for assistance with care management and care coordination.  Follow Up Plan:   Carmell Austria Upstream Scheduler

## 2021-04-10 ENCOUNTER — Telehealth: Payer: Self-pay | Admitting: Internal Medicine

## 2021-04-10 NOTE — Chronic Care Management (AMB) (Signed)
  Care Management  Note   04/10/2021 Name: Jennifer Sanford MRN: [id number] DOB: 11-Jul-1951  Jennifer Sanford is a 70 y.o. year old female who is a primary care patient of Burns, Claudina Lick, MD. The care management team was consulted for assistance with chronic disease management and care coordination needs.   [name] was given information about Care Management services today including:  CCM service includes personalized support from designated clinical staff supervised by the physician, including individualized plan of care and coordination with other care providers 24/7 contact phone numbers for assistance for urgent and routine care needs. Service will only be billed when office clinical staff spend 20 minutes or more in a month to coordinate care. Only one practitioner may furnish and bill the service in a calendar month. The patient may stop CCM services at amy time (effective at the end of the month) by phone call to the office staff. The patient will be responsible for cost sharing (co-pay) or up to 20% of the service fee (after annual deductible is met)  Patient agreed to services and verbal consent obtained.  Follow up plan:   An initial telephone outreach has been scheduled for: 05/11/2021 _0   Noelle Penner Upstream Scheduler

## 2021-04-10 NOTE — Chronic Care Management (AMB) (Signed)
  Care Management   Follow Up Note   04/10/2021 Name: Jennifer Sanford MRN: [id number] DOB: 02-Dec-1950   Referred by: Pincus Sanes, MD Reason for referral : No chief complaint on file.   Third unsuccessful telephone outreach was attempted today. The patient was referred to the case management team for assistance with care management and care coordination. The patient's primary care provider has been notified of our unsuccessful attempts to make or maintain contact with the patient. The care management team is pleased to engage with this patient at any time in the future should he/she be interested in assistance from the care management team.   Follow Up Plan: We have been unable to make contact with the patient for follow up. The care management team is available to follow up with the patient after provider conversation with the patient regarding recommendation for care management engagement and subsequent re-referral to the care management team.   Leonides Schanz Upstream Scheduler

## 2021-04-11 ENCOUNTER — Other Ambulatory Visit: Payer: Self-pay | Admitting: Internal Medicine

## 2021-04-25 ENCOUNTER — Telehealth: Payer: Self-pay

## 2021-04-25 NOTE — Chronic Care Management (AMB) (Signed)
    Chronic Care Management Pharmacy Assistant   Name: Stephaine Breshears  MRN: [id number] DOB: April 25, 1951  Fernand Parkins Jennifer Sanford is an 70 y.o. year old female who presents for his initial CCM visit with the clinical pharmacist.  Recent office visits:  01/17/21-Stacy Hetty Ely, MD (PCP, Video Visit) Seen for COVID-19 and hospital follow up. Start on Tussionex 5 mL every 12 hours as needed.  Recent consult visits:  02/23/21-Kevin Supple (Orthopedic surgery) Notes not available.  Hospital visits:  Medication Reconciliation was completed by comparing discharge summary, patient's EMR and Pharmacy list, and upon discussion with patient.  Admitted to the hospital on 01/11/21 due to a fall. Discharge date was 04/21/2. Discharged from [facility].    New?Medications Started at [facility] Discharge:?? -started none noted  Medication Changes at Hospital Discharge: -Changed none noted  Medications Discontinued at Hospital Discharge: -Stopped none noted Medications that remain the same after Hospital Discharge:??  -All other medications will remain the same.    Medications: Outpatient Encounter Medications as of 04/25/2021  Medication Sig   B Complex Vitamins (B COMPLEX 50) TABS daily   Biotin 1000 MCG tablet    Calcium Carb-Cholecalciferol (CALCIUM 600 + D PO)    chlorpheniramine-HYDROcodone (TUSSIONEX PENNKINETIC ER) 10-8 MG/5ML SUER Take 5 mLs by mouth every 12 (twelve) hours as needed for cough.   Cholecalciferol (VITAMIN D3) 1000 units CAPS daily   citalopram (CELEXA) 20 MG tablet TAKE 1 TABLET (20 MG TOTAL) BY MOUTH EVERY MORNING.   cyclobenzaprine (FLEXERIL) 10 MG tablet Take 1 tablet (10 mg total) by mouth every 8 (eight) hours as needed.   famotidine (PEPCID) 40 MG tablet Take 1 tablet (40 mg total) by mouth 2 (two) times daily.   fenofibrate 160 MG tablet TAKE 1 TABLET (160 MG TOTAL) BY MOUTH EVERY MORNING.   pantoprazole (PROTONIX) 40 MG tablet Take 1 tablet  (40 mg total) by mouth 2 (two) times daily before a meal.   predniSONE (DELTASONE) 10 MG tablet Take 6 tablets by mouth x1 day, 5 tablets  x 1 day, 4 tablets  x 1 day, 3 tablets x 1 day, 2 tablets  x 1 day, 1 tablet  x 1 day   traMADol (ULTRAM) 50 MG tablet Take 1 tablet (50 mg total) by mouth every 4 to 6 hours as needed for pain   triamterene-hydrochlorothiazide (MAXZIDE-25) 37.5-25 MG tablet TAKE 1/2 TABLET AT BEDTIME   Zinc 50 MG CAPS    No facility-administered encounter medications on file as of 04/25/2021.   B Complex Vitamins (B COMPLEX 50) TABS Last filled:None noted Biotin 1000 MCG tablet Last filled:None noted Calcium Carb-Cholecalciferol (CALCIUM 600 + D PO) Last filled:None noted Chlorpheniramine-HYDROcodone (TUSSIONEX  PENNKINETIC ER) 10-8 MG/5ML SURE Last filled:01/17/21 12 DS Cholecalciferol (VITAMIN D3) 1000 units CAPS Last filled:None noted Citalopram (CELEXA) 20 MG tablet Last filled:04/11/21 90 DS Cyclobenzaprine (FLEXERIL) 10 MG tablet Last filled:03/12/21 10 DS Famotidine (PEPCID) 40 MG tablet Last filled:03/07/21 90 DS Fenofibrate 160 MG tablet Last filled:04/11/21 90 DS Pantoprazole (PROTONIX) 40 MG tablet Last filled:03/07/21 90 DS PredniSONE (DELTASONE) 10 MG tablet Last filled:03/27/21 6 DS TraMADol (ULTRAM) 50 MG tablet Last filled:02/23/21 4 DS Triamterene-hydrochlorothiazide (MAXZIDE-25) 37.5-25 MG tablet Last filled:03/14/21 90 DS Zinc 50 MG CAPS Last filled:None noted   Star Rating Drugs: Triamterene-hydrochlorothiazide (MAXZIDE-25) 37.5-25 MG tablet Last filled:03/14/21 90 DS  Myriam Carolin Coy, RMA Health Concierge

## 2021-05-01 DIAGNOSIS — M5412 Radiculopathy, cervical region: Secondary | ICD-10-CM | POA: Diagnosis not present

## 2021-05-02 DIAGNOSIS — H43393 Other vitreous opacities, bilateral: Secondary | ICD-10-CM | POA: Diagnosis not present

## 2021-05-02 DIAGNOSIS — Z0100 Encounter for examination of eyes and vision without abnormal findings: Secondary | ICD-10-CM | POA: Diagnosis not present

## 2021-05-11 ENCOUNTER — Telehealth: Payer: Medicare HMO

## 2021-05-11 NOTE — Progress Notes (Deleted)
Chronic Care Management Pharmacy Note  05/11/2021 Name:  Jennifer Sanford MRN:  [id number] DOB:  November 15, 1950  Summary: ***  Recommendations/Changes made from today's visit: ***  Plan: ***  Subjective: Jennifer Sanford is an 70 y.o. year old female who is a primary patient of Burns, Claudina Lick, MD.  The CCM team was consulted for assistance with disease management and care coordination needs.    Engaged with patient by telephone for initial visit in response to provider referral for pharmacy case management and/or care coordination services.   Consent to Services:  The patient was given the following information about Chronic Care Management services today, agreed to services, and gave verbal consent: 1. CCM service includes personalized support from designated clinical staff supervised by the primary care provider, including individualized plan of care and coordination with other care providers 2. 24/7 contact phone numbers for assistance for urgent and routine care needs. 3. Service will only be billed when office clinical staff spend 20 minutes or more in a month to coordinate care. 4. Only one practitioner may furnish and bill the service in a calendar month. 5.The patient may stop CCM services at any time (effective at the end of the month) by phone call to the office staff. 6. The patient will be responsible for cost sharing (co-pay) of up to 20% of the service fee (after annual deductible is met). Patient agreed to services and consent obtained.  Patient Care Team: Binnie Rail, MD as PCP - General (Internal Medicine) Delice Bison, Darnelle Maffucci, [facility] as Pharmacist (Pharmacist)  Recent office visits:  01/17/21-Stacy Lorretta Harp, MD (PCP, Video Visit) Seen for COVID-19 and hospital follow up. Start on Tussionex 5 mL every 12 hours as needed. 10/16/22021 - Binnie Rail, MD (PCP) - switched from dexliant to pantoprazole and famotidine    Recent consult visits:  03/24/2021 - Suella Broad  (Physical Medicine and Rehabilitation) Notes not available  02/23/21-Kevin Supple (Orthopedic surgery) Notes not available.   Hospital visits:  Medication Reconciliation was completed by comparing discharge summary, patient's EMR and Pharmacy list, and upon discussion with patient.   Admitted to the hospital on 01/11/21 due to a fall/ concussion. Discharge date was 04/21/2. Discharged from [facility]?Medications Started at [facility] Discharge:?? -started none noted   Medication Changes at Hospital Discharge: -Changed none noted   Medications Discontinued at Hospital Discharge: -Stopped none noted Medications that remain the same after Hospital Discharge:??  -All other medications will remain the same.    Objective:  Lab Results  Component Value Date   CREATININE 1.09 06/28/2020   BUN 34 (H) 06/28/2020   GFR 52.49 (L) 06/28/2020   GFRNONAA 46 (L) 07/18/2019   GFRAA 53 (L) 07/18/2019   NA 139 06/28/2020   K 4.3 06/28/2020   CALCIUM 9.6 06/28/2020   CO2 31 06/28/2020   GLUCOSE 68 (L) 06/28/2020    Lab Results  Component Value Date/Time   HGBA1C 5.8 06/28/2020 09:44 AM   HGBA1C 5.8 05/03/2019 03:33 PM   GFR 52.49 (L) 06/28/2020 09:44 AM   GFR 54.92 (L) 06/07/2019 09:50 AM    Last diabetic Eye exam:  No results found for: HMDIABEYEEXA  Last diabetic Foot exam:  No results found for: HMDIABFOOTEX   Lab Results  Component Value Date   CHOL 210 (H) 06/28/2020   HDL 64.80 06/28/2020   LDLCALC 132 (H) 06/28/2020   TRIG 68.0 06/28/2020   CHOLHDL 3 06/28/2020  Hepatic Function Latest Ref Rng & Units 06/28/2020 05/03/2019 04/29/2018  Total Protein 6.0 - 8.3 g/dL 7.1 7.7 7.2  Albumin 3.5 - 5.2 g/dL 3.9 4.4 4.1  AST 0 - 37 U/L _0 ALT 0 - 35 U/L _1 Alk Phosphatase 39 - 117 U/L 57 74 67  Total Bilirubin 0.2 - 1.2 mg/dL 0.5 0.3 0.4    Lab Results  Component Value Date/Time   TSH 0.79 06/28/2020 09:44 AM   TSH 0.81  05/03/2019 03:33 PM    CBC Latest Ref Rng & Units 06/28/2020 07/18/2019 05/03/2019  WBC 4.0 - 10.5 K/uL 6.0 10.0 8.5  Hemoglobin 12.0 - 15.0 g/dL 12.2 11.7(L) 12.5  Hematocrit 36.0 - 46.0 % 37.0 36.5 37.1  Platelets 150.0 - 400.0 K/uL 294.0 234 273.0    No results found for: VD25OH  Clinical ASCVD: {YES/NO:21197} The 10-year ASCVD risk score Mikey Bussing DC Jr., et al., 2013) is: 10.5%   Values used to calculate the score:     Age: 75 years     Sex: Female     Is Non-Hispanic African American: Yes     Diabetic: No     Tobacco smoker: No     Systolic Blood Pressure: 828 mmHg     Is BP treated: Yes     HDL Cholesterol: 64.8 mg/dL     Total Cholesterol: 210 mg/dL    Depression screen PHQ 2/9 06/28/2020  Decreased Interest 0  Down, Depressed, Hopeless 0  PHQ - 2 Score 0  Some encounter information is confidential and restricted. Go to Review Flowsheets activity to see all data.     ***Other: (CHADS2VASc if Afib, MMRC or CAT for COPD, ACT, DEXA)  Social History   Tobacco Use  Smoking Status Never  Smokeless Tobacco Never   BP Readings from Last 3 Encounters:  01/11/21 128/80  06/28/20 124/76  07/18/19 123/79   Pulse Readings from Last 3 Encounters:  01/11/21 (!) 58  06/28/20 76  07/18/19 (!) 55   Wt Readings from Last 3 Encounters:  06/28/20 211 lb (95.7 kg)  07/18/19 190 lb (86.2 kg)  07/21/17 200 lb (90.7 kg)   BMI Readings from Last 3 Encounters:  06/28/20 29.43 kg/m  07/18/19 26.50 kg/m  07/21/17 28.29 kg/m    Assessment/Interventions: Review of patient past medical history, allergies, medications, health status, including review of consultants reports, laboratory and other test data, was performed as part of comprehensive evaluation and provision of chronic care management services.   SDOH:  (Social Determinants of Health) assessments and interventions performed: {yes/no:20286}  SDOH Screenings   Alcohol Screen: Not on file  Depression (PHQ2-9): Low Risk     PHQ-2 Score: 0  Financial Resource Strain: Not on file  Food Insecurity: Not on file  Housing: Not on file  Physical Activity: Not on file  Social Connections: Not on file  Stress: Not on file  Tobacco Use: Low Risk    Smoking Tobacco Use: Never   Smokeless Tobacco Use: Never  Transportation Needs: Not on file    CCM Care Plan  Allergies  Allergen Reactions   Advil [Ibuprofen] Hives   Mixed Feathers Itching   Shrimp [Shellfish Allergy] Itching    Medications Reviewed Today     Reviewed by Binnie Rail, MD (Physician) on 01/17/21 at 1013  Med List Status: <None>   Medication Order Taking? Sig Documenting Provider Last Dose Status Informant  B Complex Vitamins (B COMPLEX 50) TABS [id number] No daily  Binnie Rail, MD Taking Active Self  Biotin 1000 MCG tablet [id number] No  [provider] Taking Active   Calcium Carb-Cholecalciferol (CALCIUM 600 + D PO) [id number] No  [provider] Taking Active   Cholecalciferol (VITAMIN D3) 1000 units CAPS [id number] No daily Binnie Rail, MD Taking Active Self  citalopram (CELEXA) 20 MG tablet [id number] No Take 1 tablet (20 mg total) by mouth every morning. Binnie Rail, MD Taking Active   famotidine (PEPCID) 40 MG tablet [id number]  Take 1 tablet (40 mg total) by mouth 2 (two) times daily. Binnie Rail, MD  Active   fenofibrate 160 MG tablet [id number] No Take 1 tablet (160 mg total) by mouth every morning. Binnie Rail, MD Taking Active   pantoprazole (PROTONIX) 40 MG tablet [id number]  Take 1 tablet (40 mg total) by mouth 2 (two) times daily before a meal. Binnie Rail, MD  Active   triamterene-hydrochlorothiazide (MAXZIDE-25) 37.5-25 MG tablet [id number] No TAKE 1/2 TABLET AT BEDTIME Binnie Rail, MD Taking Active   Zinc 50 MG CAPS [id number]   [provider]  Active             Patient Active Problem List   Diagnosis Date Noted   COVID-19 01/17/2021   Concussion 01/17/2021   Hand tingling  06/28/2020   Prediabetes 04/29/2018   Hypertriglyceridemia 03/10/2018   Right upper quadrant pain 03/02/2018   Renal insufficiency 07/22/2017   Essential hypertension, benign 01/29/2016   Anxiety 01/29/2016   Depression 01/29/2016   GERD (gastroesophageal reflux disease) 01/29/2016   Ureteral stricture, right 09/20/2014   S/P ureteral reimplantation 09/13/2014   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     Medication Assistance: {MEDASSISTANCEINFO:25044}  Compliance/Adherence/Medication fill history: Care Gaps: ***  Patient's preferred pharmacy is:  [facility] [address] [place] [address] Phone: [phone] Fax: [phone]  [facility] - [place] [place] [place] [address] Phone: [phone] Fax: [phone]  [id number]Phone: [phone]: [phone]  Uses pill box? {Yes or If no, why not?:20788} Pt endorses ***% compliance  Care Plan and Follow Up Patient Decision:  {FOLLOWUP:24991}  Plan: {CM FOLLOW UP PYHOO:87579} ***  Current Barriers:  {pharmacybarriers:24917}  Pharmacist Clinical Goal(s):  Patient will {PHARMACYGOALCHOICES:24921} through collaboration with PharmD and  provider.   Interventions: 1:1 collaboration with BBinnie Rail MD regarding development and update of comprehensive plan of care as evidenced by provider attestation and co-signature Inter-disciplinary care team collaboration (see longitudinal plan of care) Comprehensive medication review performed; medication list updated in electronic medical record  Hypertension (BP goal <130/80) -{US controlled/uncontrolled:25276} -Current treatment: Triamterene-Hydrochlorothiazide 25-37.559m- 1/2 tablet at bedtime  -Medications previously tried: n/a  -Current home readings: *** -Current dietary habits: *** -Current exercise habits: *** -{ACTIONS;DENIES/REPORTS:21021675::"Denies"} hypotensive/hypertensive symptoms -Educated on {CCM BP Counseling:25124} -Counseled to monitor BP at home ***,  document, and provide log at future appointments -{CCMPHARMDINTERVENTION:25122}  Hypertriglyceridemia  (Triglyceride goal < 150) -{US controlled/uncontrolled:25276} - Last Triglyceride Level: 68 mg/dL (06/28/2020) -Current treatment: Fenofibrate 17m - 1 tablet daily  -Medications previously tried: n/a  -Current dietary patterns: *** -Current exercise habits: *** -Educated on {CCM HLD Counseling:25126} -{CCMPHARMDINTERVENTION:25122}  Prediabetes (A1c goal <6.5%) -{US controlled/uncontrolled:25276} Lab Results  Component Value Date   HGBA1C 5.8 06/28/2020  -Current medications: *** -Medications previously tried: ***  -Current meal patterns:  breakfast: ***  lunch: ***  dinner: *** snacks: *** drinks: *** -Current exercise: *** -Educated on {CCM DM COUNSELING:25123} -Counseled to check feet daily and get yearly eye exams -{CCMPHARMDINTERVENTION:25122}  Depression/Anxiety (Goal: Promotion of positive/stable mood) -{US controlled/uncontrolled:25276} -Current treatment: Citalopram 244m- 1 tablet daily  -Medications previously tried/failed: n/a -PHQ9: *** -GAD7: *** -Connected with *** for  mental health support -Educated on {CCM mental health counseling:25127} -{CCMPHARMDINTERVENTION:25122}  GERD (Goal: Acid control / prevention of reflux) -{US controlled/uncontrolled:25276} -Current treatment  Pantoprazole 4062m 1 tablet daily  Famotidine 30m60m1 tablet twice daily  -Medications previously tried: dexlansoprazole, ranitidine  -{CCMPHARMDINTERVENTION:25122}  Chronic Pain/ OA (Goal: Pain control) -{US controlled/uncontrolled:25276} -Current treatment  Tramadol 50mg25mablet every 4-6 hours  Cyclobenzaprine 10mg 10mtablet every 8 hours as needed  -Medications previously tried: meloxicam, celecoxib, norco, oxycodone, methocarbamol -{CCMPHARMDINTERVENTION:25122}   Health Maintenance -Vaccine gaps: *** -Current therapy:  Calcium Carbonate-Vitamin D3 600mg-*3mits - 1 tablet daily  Biotin 1000mcg -63mablet daily  Zinc 50mg - 152msule daily  Vitmain B complex - 1 table daily  Vitamin D3 1000 units- 1 tablet daily  -Educated on {ccm supplement counseling:25128} -{CCM Patient satisfied:25129} -{CCMPHARMDINTERVENTION:25122}  Patient Goals/Self-Care Activities Patient will:  - {pharmacypatientgoals:24919}  Follow Up Plan: {CM FOLLOW UP PLAN:2224OHCS:91980} Chart Prep = 20 minutes

## 2021-05-14 DIAGNOSIS — M5412 Radiculopathy, cervical region: Secondary | ICD-10-CM | POA: Diagnosis not present

## 2021-05-16 DIAGNOSIS — Z20822 Contact with and (suspected) exposure to covid-19: Secondary | ICD-10-CM | POA: Diagnosis not present

## 2021-05-26 ENCOUNTER — Encounter: Payer: Self-pay | Admitting: Internal Medicine

## 2021-05-26 ENCOUNTER — Other Ambulatory Visit: Payer: Self-pay | Admitting: Internal Medicine

## 2021-06-08 ENCOUNTER — Telehealth: Payer: Medicare HMO

## 2021-06-15 ENCOUNTER — Ambulatory Visit: Payer: Medicare HMO

## 2021-06-27 ENCOUNTER — Ambulatory Visit: Payer: Medicare HMO | Attending: Internal Medicine

## 2021-06-27 ENCOUNTER — Other Ambulatory Visit (HOSPITAL_BASED_OUTPATIENT_CLINIC_OR_DEPARTMENT_OTHER): Payer: Self-pay

## 2021-06-27 DIAGNOSIS — Z23 Encounter for immunization: Secondary | ICD-10-CM

## 2021-06-27 MED ORDER — INFLUENZA VAC A&B SA ADJ QUAD 0.5 ML IM PRSY
PREFILLED_SYRINGE | INTRAMUSCULAR | 0 refills | Status: DC
  Filled 2021-06-27: qty 0.5, 1d supply, fill #0

## 2021-06-27 MED ORDER — PFIZER COVID-19 VAC BIVALENT 30 MCG/0.3ML IM SUSP
INTRAMUSCULAR | 0 refills | Status: DC
  Filled 2021-06-27: qty 0.3, 1d supply, fill #0

## 2021-06-27 NOTE — Progress Notes (Signed)
   Covid-19 Vaccination Clinic  Name:  Jennifer Sanford    MRN: [id number] DOB: [birth year]  06/27/2021  Jennifer Sanford was observed post Covid-19 immunization for 15 minutes without incident. She was provided with Vaccine Information Sheet and instruction to access the V-Safe system.   Jennifer Sanford was instructed to call 911 with any severe reactions post vaccine: Difficulty breathing  Swelling of face and throat  A fast heartbeat  A bad rash all over body  Dizziness and weakness

## 2021-07-19 ENCOUNTER — Other Ambulatory Visit (HOSPITAL_BASED_OUTPATIENT_CLINIC_OR_DEPARTMENT_OTHER): Payer: Self-pay

## 2021-08-26 NOTE — Progress Notes (Signed)
Subjective:    Patient ID: Jennifer Sanford, female    DOB: 1950-11-25, 70 y.o.   MRN: [id number]  This visit occurred during the SARS-CoV-2 public health emergency.  Safety protocols were in place, including screening questions prior to the visit, additional usage of staff PPE, and extensive cleaning of exam room while observing appropriate contact time as indicated for disinfecting solutions.    HPI The patient is here for an acute visit.   Vaginal discomfort - it started several months ago and it has gotten worse - feels more like it is in the pelvic area.- she feels a throbbing, uncomfortable feeling.  It almost feels like the pelvic bones.    Her gyn she saw her gynecologist and she does have vaginal atrophy and thought that was the cause of her symptoms.  She is not currently sexually active, but when she was a while ago she did have a lot of discomfort with intercourse.      Nothing makes it worse.  If still or sitting /laying down it is more noticeable.   Has used topical cream and taking vitamin e without improvement in her symptoms.  Her symptoms have actually gotten worse.  She is concerned that something else is going on.   Medications and allergies reviewed with patient and updated if appropriate.  Patient Active Problem List   Diagnosis Date Noted   COVID-19 01/17/2021   Concussion 01/17/2021   Hand tingling 06/28/2020   Prediabetes 04/29/2018   Hypertriglyceridemia 03/10/2018   Right upper quadrant pain 03/02/2018   Renal insufficiency 07/22/2017   Essential hypertension, benign 01/29/2016   Anxiety 01/29/2016   Depression 01/29/2016   GERD (gastroesophageal reflux disease) 01/29/2016   Ureteral stricture, right 09/20/2014   S/P ureteral reimplantation 09/13/2014   S/P bilateral unicompartmental knee replacement 03/22/2013   OA (osteoarthritis) of knee 03/17/2013   PES PLANUS 08/28/2009    Current Outpatient Medications on File Prior to Visit   Medication Sig Dispense Refill   B Complex Vitamins (B COMPLEX 50) TABS daily  0   Biotin 1000 MCG tablet      Calcium Carb-Cholecalciferol (CALCIUM 600 + D PO)      Cholecalciferol (VITAMIN D3) 1000 units CAPS daily 60 capsule    citalopram (CELEXA) 20 MG tablet TAKE 1 TABLET (20 MG TOTAL) BY MOUTH EVERY MORNING. 90 tablet 3   COVID-19 mRNA bivalent vaccine, Pfizer, (PFIZER COVID-19 VAC BIVALENT) injection Inject into the muscle. 0.3 mL 0   cyclobenzaprine (FLEXERIL) 10 MG tablet Take 1 tablet (10 mg total) by mouth every 8 (eight) hours as needed. 30 tablet 0   famotidine (PEPCID) 40 MG tablet Take 1 tablet (40 mg total) by mouth 2 (two) times daily. 180 tablet 3   fenofibrate 160 MG tablet TAKE 1 TABLET (160 MG TOTAL) BY MOUTH EVERY MORNING. 90 tablet 3   influenza vaccine adjuvanted (FLUAD) 0.5 ML injection Inject into the muscle. 0.5 mL 0   pantoprazole (PROTONIX) 40 MG tablet Take 1 tablet (40 mg total) by mouth 2 (two) times daily before a meal. 180 tablet 3   triamterene-hydrochlorothiazide (MAXZIDE-25) 37.5-25 MG tablet TAKE 1/2 TABLET AT BEDTIME 45 tablet 0   VAGINAL LUBRICANT [facility] vaginally. Patient currently using GYNATROF suppositories     Zinc 50 MG CAPS      No current facility-administered medications on file prior to visit.    Past Medical History:  Diagnosis Date   Complication of anesthesia    VERY  CLAUSTROPHIC W/ MASK   Depression    GERD (gastroesophageal reflux disease)    History of ureter repair    right ureteral reimplant for stricture 09-03-2014   Hx of transfusion of packed red blood cells    Hyperlipidemia    Hypertension    no meds s/p wt loss   OA (osteoarthritis)    Postop Transfusion 03/21/2013   Postoperative wound infection     Past Surgical History:  Procedure Laterality Date   BREAST REDUCTION SURGERY Bilateral 04-12-2008   CARDIOVASCULAR STRESS TEST  12-26-2009   Normal nuclear study/  no ischemia/  ef 65%   CYSTO/  RIGHT RETROGRADE  PYELOGRAM/  BALLOON DILATION RIGHT URETER STRICTURE/  STENT PLACEMENT  05-25-2010//   03-14-2009//   01-28-2009   CYSTOSCOPY WITH STENT PLACEMENT Right 02/18/2014   Procedure: CYSTOSCOPY WITH right STENT PLACEMENT;  Surgeon: Arvil Persons, MD;  Location[facility];  Service: Urology;  Laterality: Right;   EXPLORATORY LAPAROTOMY W/ RIGHT URETER REPAIR  2000   INCISION AND DRAINAGE ABSCESS N/A 11/28/2014   Procedure: INCISION AND DRAINAGE ABSCESS;  Surgeon: Ardis Hughs, MD;  Location: [facility];  Service: Urology;  Laterality: N/A;   INCISION AND DRAINAGE ABSCESS N/A 02/15/2015   Procedure: INCISION AND DRAINAGE OF WOUND;  Surgeon: Ardis Hughs, MD;  Location[facility];  Service: Urology;  Laterality: N/A;   KNEE ARTHROSCOPY  left  2004//   bilateral 1980   KNEE CLOSED REDUCTION Bilateral 06/02/2013   Procedure: CLOSED MANIPULATION BILATERAL KNEES;  Surgeon: Gearlean Alf, MD;  Location: [facility];  Service: Orthopedics;  Laterality: Bilateral;   REDUCTION MAMMAPLASTY     TONSILLECTOMY AND ADENOIDECTOMY  as child   TOTAL KNEE ARTHROPLASTY Bilateral 03/17/2013   Procedure: TOTAL KNEE BILATERAL;  Surgeon: Gearlean Alf, MD;  Location: [facility];  Service: Orthopedics;  Laterality: Bilateral;   URETERAL REIMPLANTION Right 09/13/2014   Procedure: OPEN RIGHT URETERAL REIMPLANT;  Surgeon: Ardis Hughs, MD;  Location: [facility];  Service: Urology;  Laterality: Right;   VAGINAL HYSTERECTOMY  2000   w/ right salpingoophorectomy    Social History   Socioeconomic History   Marital status: Divorced    Spouse name: Not on file   Number of children: Not on file   Years of education: Not on file   Highest education level: Not on file  Occupational History   Not on file  Tobacco Use   Smoking status: Never   Smokeless tobacco: Never  Vaping Use   Vaping Use: Never used  Substance and Sexual Activity   Alcohol use: No    Alcohol/week: 0.0  standard drinks   Drug use: No   Sexual activity: Not on file  Other Topics Concern   Not on file  Social History Narrative   [place].  Lives alone in a 2 story home.  Has 3 daughters.     Work as a Armed forces operational officer at [employer].     Education: college.   Social Determinants of Health   Financial Resource Strain: Not on file  Food Insecurity: Not on file  Transportation Needs: Not on file  Physical Activity: Not on file  Stress: Not on file  Social Connections: Not on file    Family History  Problem Relation Age of Onset   Diabetes Mother        Deceased   Heart attack Mother    Dementia  Mother    Hypertension Father        Deceased   Lung cancer Father    Cancer Other    Heart Problems Brother        CABG   Bipolar disorder Sister    Healthy Daughter    Breast cancer Maternal Grandmother     Review of Systems  Constitutional:  Negative for fever.  Genitourinary:  Positive for dyspareunia (a while ago - it was uncomfortable) and pelvic pain. Negative for difficulty urinating, dysuria, frequency, hematuria, urgency, vaginal bleeding and vaginal discharge.      Objective:   Vitals:   08/27/21 1514  BP: 122/72  Pulse: (!) 59  Temp: 98.5 F (36.9 C)  SpO2: 95%   BP Readings from Last 3 Encounters:  08/27/21 122/72  01/11/21 128/80  06/28/20 124/76   Wt Readings from Last 3 Encounters:  08/27/21 212 lb (96.2 kg)  06/28/20 211 lb (95.7 kg)  07/18/19 190 lb (86.2 kg)   Body mass index is 29.57 kg/m.   Physical Exam Constitutional:      General: She is not in acute distress.    Appearance: Normal appearance. She is not ill-appearing.  HENT:     Head: Normocephalic and atraumatic.  Abdominal:     General: There is no distension.     Palpations: Abdomen is soft.     Tenderness: There is abdominal tenderness (Right lower quadrant). There is no guarding or rebound.  Genitourinary:    Comments: Deferred Skin:    General: Skin is warm and  dry.  Neurological:     Mental Status: She is alert.           Assessment & Plan:    See Problem List for Assessment and Plan of chronic medical problems.

## 2021-08-27 ENCOUNTER — Ambulatory Visit (INDEPENDENT_AMBULATORY_CARE_PROVIDER_SITE_OTHER): Payer: Medicare HMO | Admitting: Internal Medicine

## 2021-08-27 ENCOUNTER — Other Ambulatory Visit: Payer: Self-pay

## 2021-08-27 ENCOUNTER — Encounter: Payer: Self-pay | Admitting: Internal Medicine

## 2021-08-27 VITALS — BP 122/72 | HR 59 | Temp 98.5°F | Ht 71.0 in | Wt 212.0 lb

## 2021-08-27 DIAGNOSIS — R102 Pelvic and perineal pain: Secondary | ICD-10-CM | POA: Insufficient documentation

## 2021-08-27 NOTE — Assessment & Plan Note (Signed)
Acute Has had some pelvic discomfort for several months She has seen her gynecologist and does have some vaginal atrophy which is likely causing some of her symptoms, but her pelvic discomfort is getting worse and I agree there could be some other cause for some of her symptoms I will go ahead and order a pelvic/transvaginal ultrasound She will follow-up with her gynecologist-Dr. Cherly Hensen

## 2021-08-27 NOTE — Patient Instructions (Addendum)
   An [place] was ordered   Follow up with Dr Cherly Hensen.

## 2021-09-10 ENCOUNTER — Other Ambulatory Visit: Payer: Medicare HMO

## 2021-09-26 ENCOUNTER — Encounter: Payer: Self-pay | Admitting: Internal Medicine

## 2021-10-11 ENCOUNTER — Other Ambulatory Visit: Payer: Medicare HMO

## 2021-10-11 ENCOUNTER — Ambulatory Visit
Admission: RE | Admit: 2021-10-11 | Discharge: 2021-10-11 | Disposition: A | Discharge status: Home / Self Care | Payer: Medicare HMO | Source: Ambulatory Visit | Attending: Internal Medicine | Admitting: Internal Medicine

## 2021-10-11 DIAGNOSIS — R102 Pelvic and perineal pain: Secondary | ICD-10-CM

## 2021-10-11 DIAGNOSIS — Z90710 Acquired absence of both cervix and uterus: Secondary | ICD-10-CM | POA: Diagnosis not present

## 2021-10-22 ENCOUNTER — Other Ambulatory Visit: Payer: Self-pay | Admitting: Internal Medicine

## 2021-11-14 ENCOUNTER — Other Ambulatory Visit: Payer: Self-pay | Admitting: Internal Medicine

## 2021-11-14 ENCOUNTER — Encounter: Payer: Self-pay | Admitting: Internal Medicine

## 2022-01-08 ENCOUNTER — Encounter: Payer: Self-pay | Admitting: Internal Medicine

## 2022-01-09 MED ORDER — MECLIZINE HCL 25 MG PO TABS: 25.0000 mg | ORAL_TABLET | Freq: Three times a day (TID) | ORAL | 1 refills | Status: DC | PRN

## 2022-01-23 DIAGNOSIS — M25532 Pain in left wrist: Secondary | ICD-10-CM | POA: Diagnosis not present

## 2022-01-28 DIAGNOSIS — M65832 Other synovitis and tenosynovitis, left forearm: Secondary | ICD-10-CM | POA: Diagnosis not present

## 2022-02-20 DIAGNOSIS — R0981 Nasal congestion: Secondary | ICD-10-CM | POA: Diagnosis not present

## 2022-02-25 DIAGNOSIS — G5603 Carpal tunnel syndrome, bilateral upper limbs: Secondary | ICD-10-CM | POA: Diagnosis not present

## 2022-02-25 DIAGNOSIS — M5412 Radiculopathy, cervical region: Secondary | ICD-10-CM | POA: Diagnosis not present

## 2022-02-25 DIAGNOSIS — M25532 Pain in left wrist: Secondary | ICD-10-CM | POA: Diagnosis not present

## 2022-02-25 DIAGNOSIS — M65832 Other synovitis and tenosynovitis, left forearm: Secondary | ICD-10-CM | POA: Diagnosis not present

## 2022-03-17 ENCOUNTER — Other Ambulatory Visit: Payer: Self-pay | Admitting: Internal Medicine

## 2022-03-29 ENCOUNTER — Encounter: Payer: Self-pay | Admitting: Internal Medicine

## 2022-03-29 DIAGNOSIS — I10 Essential (primary) hypertension: Secondary | ICD-10-CM

## 2022-03-31 NOTE — Addendum Note (Signed)
Addended by: Pincus Sanes on: 03/31/2022 03:59 AM   Modules accepted: Orders

## 2022-04-01 ENCOUNTER — Other Ambulatory Visit (INDEPENDENT_AMBULATORY_CARE_PROVIDER_SITE_OTHER): Payer: Medicare HMO

## 2022-04-01 DIAGNOSIS — I10 Essential (primary) hypertension: Secondary | ICD-10-CM | POA: Diagnosis not present

## 2022-04-01 DIAGNOSIS — N179 Acute kidney failure, unspecified: Secondary | ICD-10-CM

## 2022-04-01 DIAGNOSIS — G5603 Carpal tunnel syndrome, bilateral upper limbs: Secondary | ICD-10-CM | POA: Diagnosis not present

## 2022-04-01 DIAGNOSIS — M25532 Pain in left wrist: Secondary | ICD-10-CM | POA: Diagnosis not present

## 2022-04-01 DIAGNOSIS — M542 Cervicalgia: Secondary | ICD-10-CM | POA: Diagnosis not present

## 2022-04-01 DIAGNOSIS — M65839 Other synovitis and tenosynovitis, unspecified forearm: Secondary | ICD-10-CM | POA: Diagnosis not present

## 2022-04-01 LAB — BASIC METABOLIC PANEL
BUN: 44 mg/dL — ABNORMAL HIGH (ref 6–23)
CO2: 27 mEq/L (ref 19–32)
Calcium: 10 mg/dL (ref 8.4–10.5)
Chloride: 101 mEq/L (ref 96–112)
Creatinine, Ser: 1.39 mg/dL — ABNORMAL HIGH (ref 0.40–1.20)
GFR: 38.92 mL/min — ABNORMAL LOW (ref >60.00)
Glucose, Bld: 72 mg/dL (ref 70–99)
Potassium: 4.9 mEq/L (ref 3.5–5.1)
Sodium: 137 mEq/L (ref 135–145)

## 2022-04-04 ENCOUNTER — Encounter: Payer: Self-pay | Admitting: Internal Medicine

## 2022-04-08 ENCOUNTER — Other Ambulatory Visit: Payer: Medicare HMO

## 2022-04-08 DIAGNOSIS — M5412 Radiculopathy, cervical region: Secondary | ICD-10-CM | POA: Diagnosis not present

## 2022-04-09 ENCOUNTER — Other Ambulatory Visit (INDEPENDENT_AMBULATORY_CARE_PROVIDER_SITE_OTHER): Payer: Medicare HMO

## 2022-04-09 DIAGNOSIS — N179 Acute kidney failure, unspecified: Secondary | ICD-10-CM

## 2022-04-09 LAB — BASIC METABOLIC PANEL
BUN: 27 mg/dL — ABNORMAL HIGH (ref 6–23)
CO2: 29 mEq/L (ref 19–32)
Calcium: 9.3 mg/dL (ref 8.4–10.5)
Chloride: 104 mEq/L (ref 96–112)
Creatinine, Ser: 1.14 mg/dL (ref 0.40–1.20)
GFR: 49.37 mL/min — ABNORMAL LOW (ref >60.00)
Glucose, Bld: 74 mg/dL (ref 70–99)
Potassium: 4.2 mEq/L (ref 3.5–5.1)
Sodium: 141 mEq/L (ref 135–145)

## 2022-04-10 DIAGNOSIS — G5601 Carpal tunnel syndrome, right upper limb: Secondary | ICD-10-CM | POA: Diagnosis not present

## 2022-04-23 NOTE — Progress Notes (Unsigned)
Subjective:    Patient ID: Jennifer Sanford, female    DOB: [birth year], 71 y.o.   MRN: [id number]     HPI Jennifer Sanford is here for follow up of her chronic medical problems, including htn, GERD, CKD, anxiety, depression, triglyceridemia, prediabetes  Having CTS release on right hand on Monday - Dr Amanda Pea.  Had steroid injection for this recently and that did cause some mild kidney injury which she has had in the past from steroids.  She does not take any NSAIDs.  BP at home - very well controlled.  She did try going without her medication and her blood pressure remained controlled, but she had leg edema.  She was wondering about just taking HCTZ.  Exercise regularly.  Gained weight-has been on several vacations and has not been watching her diet.  Knows she needs to lose weight.  Trying to eating healthy -- eats too many sweets.   Medications and allergies reviewed with patient and updated if appropriate.  Current Outpatient Medications on File Prior to Visit  Medication Sig Dispense Refill   B Complex Vitamins (B COMPLEX 50) TABS daily  0   Biotin 1000 MCG tablet      Calcium Carb-Cholecalciferol (CALCIUM 600 + D PO)      Cholecalciferol (VITAMIN D3) 1000 units CAPS daily 60 capsule    citalopram (CELEXA) 20 MG tablet TAKE 1 TABLET (20 MG TOTAL) BY MOUTH EVERY MORNING. 90 tablet 3   famotidine (PEPCID) 40 MG tablet TAKE 1 TABLET TWICE DAILY 180 tablet 3   fenofibrate 160 MG tablet TAKE 1 TABLET (160 MG TOTAL) BY MOUTH EVERY MORNING. 90 tablet 3   pantoprazole (PROTONIX) 40 MG tablet TAKE 1 TABLET TWICE DAILY BEFORE MEALS 180 tablet 3   VAGINAL LUBRICANT [facility] vaginally. Patient currently using GYNATROF suppositories     Zinc 50 MG CAPS      No current facility-administered medications on file prior to visit.     Review of Systems  Constitutional:  Negative for chills and fever.  Respiratory:  Negative for cough, shortness of breath and wheezing.   Cardiovascular:   Positive for leg swelling (controlled with medication). Negative for chest pain and palpitations.  Neurological:  Positive for light-headedness (w/ dehydratoin). Negative for headaches.       Objective:   Vitals:   04/24/22 0852  BP: 116/78  Pulse: 79  Temp: 98 F (36.7 C)  SpO2: 99%   BP Readings from Last 3 Encounters:  04/24/22 116/78  08/27/21 122/72  01/11/21 128/80   Wt Readings from Last 3 Encounters:  04/24/22 216 lb (98 kg)  08/27/21 212 lb (96.2 kg)  06/28/20 211 lb (95.7 kg)   Body mass index is 30.13 kg/m.    Physical Exam Constitutional:      General: She is not in acute distress.    Appearance: Normal appearance.  HENT:     Head: Normocephalic and atraumatic.  Eyes:     Conjunctiva/sclera: Conjunctivae normal.  Cardiovascular:     Rate and Rhythm: Normal rate and regular rhythm.     Heart sounds: Normal heart sounds. No murmur heard. Pulmonary:     Effort: Pulmonary effort is normal. No respiratory distress.     Breath sounds: Normal breath sounds. No wheezing.  Musculoskeletal:     Cervical back: Neck supple.     Right lower leg: No edema.     Left lower leg: No edema.  Lymphadenopathy:  Cervical: No cervical adenopathy.  Skin:    General: Skin is warm and dry.     Findings: No rash.  Neurological:     Mental Status: She is alert. Mental status is at baseline.  Psychiatric:        Mood and Affect: Mood normal.        Behavior: Behavior normal.        Lab Results  Component Value Date   WBC 6.0 06/28/2020   HGB 12.2 06/28/2020   HCT 37.0 06/28/2020   PLT 294.0 06/28/2020   GLUCOSE 74 04/09/2022   CHOL 210 (H) 06/28/2020   TRIG 68.0 06/28/2020   HDL 64.80 06/28/2020   LDLCALC 132 (H) 06/28/2020   ALT 17 06/28/2020   AST 22 06/28/2020   NA 141 04/09/2022   K 4.2 04/09/2022   CL 104 04/09/2022   CREATININE 1.14 04/09/2022   BUN 27 (H) 04/09/2022   CO2 29 04/09/2022   TSH 0.79 06/28/2020   INR 2.44 (H) 04/02/2013   HGBA1C  5.8 06/28/2020     Assessment & Plan:    See Problem List for Assessment and Plan of chronic medical problems.

## 2022-04-24 ENCOUNTER — Ambulatory Visit (INDEPENDENT_AMBULATORY_CARE_PROVIDER_SITE_OTHER): Payer: Medicare HMO | Admitting: Internal Medicine

## 2022-04-24 ENCOUNTER — Encounter: Payer: Self-pay | Admitting: Internal Medicine

## 2022-04-24 VITALS — BP 116/78 | HR 79 | Temp 98.0°F | Ht 71.0 in | Wt 216.0 lb

## 2022-04-24 DIAGNOSIS — N1831 Chronic kidney disease, stage 3a: Secondary | ICD-10-CM | POA: Diagnosis not present

## 2022-04-24 DIAGNOSIS — F419 Anxiety disorder, unspecified: Secondary | ICD-10-CM | POA: Diagnosis not present

## 2022-04-24 DIAGNOSIS — K219 Gastro-esophageal reflux disease without esophagitis: Secondary | ICD-10-CM | POA: Diagnosis not present

## 2022-04-24 DIAGNOSIS — I10 Essential (primary) hypertension: Secondary | ICD-10-CM | POA: Diagnosis not present

## 2022-04-24 DIAGNOSIS — R7303 Prediabetes: Secondary | ICD-10-CM | POA: Diagnosis not present

## 2022-04-24 DIAGNOSIS — F3289 Other specified depressive episodes: Secondary | ICD-10-CM

## 2022-04-24 DIAGNOSIS — E781 Pure hyperglyceridemia: Secondary | ICD-10-CM

## 2022-04-24 LAB — LIPID PANEL
Cholesterol: 326 mg/dL — ABNORMAL HIGH (ref 0–200)
HDL: 61.9 mg/dL (ref >39.00)
LDL Cholesterol: 234 mg/dL — ABNORMAL HIGH (ref 0–99)
NonHDL: 264.16
Total CHOL/HDL Ratio: 5
Triglycerides: 151 mg/dL — ABNORMAL HIGH (ref 0.0–149.0)
VLDL: 30.2 mg/dL (ref 0.0–40.0)

## 2022-04-24 LAB — CBC WITH DIFFERENTIAL/PLATELET
Basophils Absolute: 0 10*3/uL (ref 0.0–0.1)
Basophils Relative: 0.5 % (ref 0.0–3.0)
Eosinophils Absolute: 0.2 10*3/uL (ref 0.0–0.7)
Eosinophils Relative: 3.1 % (ref 0.0–5.0)
HCT: 39.8 % (ref 36.0–46.0)
Hemoglobin: 13.3 g/dL (ref 12.0–15.0)
Lymphocytes Relative: 50 % — ABNORMAL HIGH (ref 12.0–46.0)
Lymphs Abs: 2.8 10*3/uL (ref 0.7–4.0)
MCHC: 33.3 g/dL (ref 30.0–36.0)
MCV: 89.6 fl (ref 78.0–100.0)
Monocytes Absolute: 0.6 10*3/uL (ref 0.1–1.0)
Monocytes Relative: 10.2 % (ref 3.0–12.0)
Neutro Abs: 2 10*3/uL (ref 1.4–7.7)
Neutrophils Relative %: 36.2 % — ABNORMAL LOW (ref 43.0–77.0)
Platelets: 372 10*3/uL (ref 150.0–400.0)
RBC: 4.44 Mil/uL (ref 3.87–5.11)
RDW: 15.2 % (ref 11.5–15.5)
WBC: 5.6 10*3/uL (ref 4.0–10.5)

## 2022-04-24 LAB — COMPREHENSIVE METABOLIC PANEL
ALT: 18 U/L (ref 0–35)
AST: 24 U/L (ref 0–37)
Albumin: 4.3 g/dL (ref 3.5–5.2)
Alkaline Phosphatase: 67 U/L (ref 39–117)
BUN: 30 mg/dL — ABNORMAL HIGH (ref 6–23)
CO2: 29 mEq/L (ref 19–32)
Calcium: 10.1 mg/dL (ref 8.4–10.5)
Chloride: 102 mEq/L (ref 96–112)
Creatinine, Ser: 1.3 mg/dL — ABNORMAL HIGH (ref 0.40–1.20)
GFR: 42.16 mL/min — ABNORMAL LOW (ref >60.00)
Glucose, Bld: 64 mg/dL — ABNORMAL LOW (ref 70–99)
Potassium: 4.2 mEq/L (ref 3.5–5.1)
Sodium: 139 mEq/L (ref 135–145)
Total Bilirubin: 0.4 mg/dL (ref 0.2–1.2)
Total Protein: 7.8 g/dL (ref 6.0–8.3)

## 2022-04-24 LAB — HEMOGLOBIN A1C: Hgb A1c MFr Bld: 6.1 % (ref 4.6–6.5)

## 2022-04-24 MED ORDER — HYDROCHLOROTHIAZIDE 25 MG PO TABS: 25.0000 mg | ORAL_TABLET | Freq: Every day | ORAL | 3 refills | Status: DC

## 2022-04-24 NOTE — Assessment & Plan Note (Addendum)
Chronic Blood pressure well controlled here and at home.  She actually stopped her medication for a while and her blood pressure was still controlled, but had leg edema CMP today Stop triamterene-hydrochlorothiazide 37.5-25 mg 1/2 tablet daily Start hctz 25 mg daily - see if that controls edema, BP-is not well controlled can try half a pill daily Stressed increase water intake BMP next week

## 2022-04-24 NOTE — Patient Instructions (Addendum)
     Blood work was ordered for today and for next week.     Medications changes include :   stop the triamterene and take hctz only.  Monitor your BP   Your prescription(s) have been sent to your pharmacy.     Return in about 6 months (around 10/25/2022) for Physical Exam.

## 2022-04-24 NOTE — Assessment & Plan Note (Signed)
Chronic Controlled, Stable Continue Celexa 20 mg daily 

## 2022-04-24 NOTE — Assessment & Plan Note (Addendum)
Chronic Check a1c Low sugar / carb diet-discussed trying to eliminate some of her sugars-she does have a sweet tooth and has not been compliant with a low sugar diet Stressed regular exercise  Lab Results  Component Value Date   HGBA1C 5.8 06/28/2020

## 2022-04-24 NOTE — Assessment & Plan Note (Signed)
Chronic Regular exercise and healthy diet encouraged Check lipid panel  Continue fenofibrate 160 mg daily 

## 2022-04-24 NOTE — Assessment & Plan Note (Addendum)
Chronic Kidney function decreased on several occasions-most recently had mild AKI secondary to steroids Blood pressure has been well controlled Does not take any NSAIDs Not drinking enough fluids-increase water intake Discussed trying to taper off PPI-she will try this

## 2022-04-24 NOTE — Assessment & Plan Note (Addendum)
Chronic GERD controlled Taking pantoprazole 40 mg twice daily, Famotidine 40 mg twice a day Discussed trying to decrease pantoprazole-  Try to decrease dec pantoprazole to 40 mg QD-if she has GERD can try 40 mg once a day alternating with 40 mg twice a day more 40 mg daily alternating with 20 mg Continue famotidine 40 mg bid GERD diet Working on weight loss

## 2022-04-25 ENCOUNTER — Encounter: Payer: Self-pay | Admitting: Internal Medicine

## 2022-04-25 ENCOUNTER — Other Ambulatory Visit: Payer: Self-pay | Admitting: Internal Medicine

## 2022-04-25 DIAGNOSIS — Z1231 Encounter for screening mammogram for malignant neoplasm of breast: Secondary | ICD-10-CM

## 2022-04-29 DIAGNOSIS — G5601 Carpal tunnel syndrome, right upper limb: Secondary | ICD-10-CM | POA: Diagnosis not present

## 2022-05-03 ENCOUNTER — Other Ambulatory Visit (INDEPENDENT_AMBULATORY_CARE_PROVIDER_SITE_OTHER): Payer: Medicare HMO

## 2022-05-03 DIAGNOSIS — I10 Essential (primary) hypertension: Secondary | ICD-10-CM

## 2022-05-03 DIAGNOSIS — N1831 Chronic kidney disease, stage 3a: Secondary | ICD-10-CM | POA: Diagnosis not present

## 2022-05-03 LAB — BASIC METABOLIC PANEL
BUN: 27 mg/dL — ABNORMAL HIGH (ref 6–23)
CO2: 32 mEq/L (ref 19–32)
Calcium: 9.7 mg/dL (ref 8.4–10.5)
Chloride: 102 mEq/L (ref 96–112)
Creatinine, Ser: 1.24 mg/dL — ABNORMAL HIGH (ref 0.40–1.20)
GFR: 44.61 mL/min — ABNORMAL LOW (ref >60.00)
Glucose, Bld: 63 mg/dL — ABNORMAL LOW (ref 70–99)
Potassium: 4.2 mEq/L (ref 3.5–5.1)
Sodium: 141 mEq/L (ref 135–145)

## 2022-05-07 ENCOUNTER — Ambulatory Visit: Payer: Medicare HMO

## 2022-05-07 ENCOUNTER — Encounter: Payer: Self-pay | Admitting: Internal Medicine

## 2022-05-07 DIAGNOSIS — N189 Chronic kidney disease, unspecified: Secondary | ICD-10-CM

## 2022-05-10 ENCOUNTER — Encounter: Payer: Self-pay | Admitting: Internal Medicine

## 2022-05-12 NOTE — Addendum Note (Signed)
Addended by: Pincus Sanes on: 05/12/2022 04:16 PM   Modules accepted: Orders

## 2022-05-13 DIAGNOSIS — M25531 Pain in right wrist: Secondary | ICD-10-CM | POA: Diagnosis not present

## 2022-05-13 DIAGNOSIS — Z4789 Encounter for other orthopedic aftercare: Secondary | ICD-10-CM | POA: Diagnosis not present

## 2022-05-13 DIAGNOSIS — G5601 Carpal tunnel syndrome, right upper limb: Secondary | ICD-10-CM | POA: Diagnosis not present

## 2022-05-28 ENCOUNTER — Encounter: Payer: Self-pay | Admitting: Internal Medicine

## 2022-05-28 ENCOUNTER — Ambulatory Visit: Payer: Medicare HMO

## 2022-05-29 ENCOUNTER — Ambulatory Visit
Admission: EM | Admit: 2022-05-29 | Discharge: 2022-05-29 | Disposition: A | Discharge status: Home / Self Care | Payer: Medicare HMO | Attending: Physician Assistant | Admitting: Physician Assistant

## 2022-05-29 DIAGNOSIS — B029 Zoster without complications: Secondary | ICD-10-CM | POA: Diagnosis not present

## 2022-05-29 MED ORDER — VALACYCLOVIR HCL 1 G PO TABS: 1000.0000 mg | ORAL_TABLET | Freq: Three times a day (TID) | ORAL | 0 refills | Status: DC

## 2022-05-29 NOTE — ED Provider Notes (Signed)
[facility]    CSN: [id number] Arrival date & time: 05/29/22  1716      History   Chief Complaint Chief Complaint  Patient presents with   Rash    HPI Jennifer Sanford is a 71 y.o. female.   Patient here today for evaluation of a rash to her right forearm she noticed a week ago.  She reports that rash is painful and itchy as well.  She has not had any improvement with steroids topically.  She denies any fever or shortness of breath.  She denies any exposure to poison oak or poison ivy or time outside recently.  Rash is not present elsewhere.  The history is provided by the patient.    Past Medical History:  Diagnosis Date   Complication of anesthesia    VERY CLAUSTROPHIC W/ MASK   Depression    GERD (gastroesophageal reflux disease)    History of ureter repair    right ureteral reimplant for stricture 09-03-2014   Hx of transfusion of packed red blood cells    Hyperlipidemia    Hypertension    no meds s/p wt loss   OA (osteoarthritis)    Postop Transfusion 03/21/2013   Postoperative wound infection     Patient Active Problem List   Diagnosis Date Noted   COVID-19 01/17/2021   Concussion 01/17/2021   CTS (carpal tunnel syndrome) 06/28/2020   Prediabetes 04/29/2018   Hypertriglyceridemia 03/10/2018   CKD (chronic kidney disease) stage 3, GFR 30-59 ml/min (HCC) 07/22/2017   Essential hypertension, benign 01/29/2016   Anxiety 01/29/2016   Depression 01/29/2016   GERD (gastroesophageal reflux disease) 01/29/2016   Ureteral stricture, right 09/20/2014   S/P ureteral reimplantation 09/13/2014   S/P bilateral unicompartmental knee replacement 03/22/2013   OA (osteoarthritis) of knee 03/17/2013   PES PLANUS 08/28/2009    Past Surgical History:  Procedure Laterality Date   BREAST REDUCTION SURGERY Bilateral 04-12-2008   CARDIOVASCULAR STRESS TEST  12-26-2009   Normal nuclear study/  no ischemia/  ef 65%   CYSTO/  RIGHT RETROGRADE PYELOGRAM/   BALLOON DILATION RIGHT URETER STRICTURE/  STENT PLACEMENT  05-25-2010//   03-14-2009//   01-28-2009   CYSTOSCOPY WITH STENT PLACEMENT Right 02/18/2014   Procedure: CYSTOSCOPY WITH right STENT PLACEMENT;  Surgeon: Danae Chen, MD;  Location: [facility];  Service: Urology;  Laterality: Right;   EXPLORATORY LAPAROTOMY W/ RIGHT URETER REPAIR  2000   INCISION AND DRAINAGE ABSCESS N/A 11/28/2014   Procedure: INCISION AND DRAINAGE ABSCESS;  Surgeon: Crist Fat, MD;  Location: [facility];  Service: Urology;  Laterality: N/A;   INCISION AND DRAINAGE ABSCESS N/A 02/15/2015   Procedure: INCISION AND DRAINAGE OF WOUND;  Surgeon: Crist Fat, MD;  Location[facility];  Service: Urology;  Laterality: N/A;   KNEE ARTHROSCOPY  left  2004//   bilateral 1980   KNEE CLOSED REDUCTION Bilateral 06/02/2013   Procedure: CLOSED MANIPULATION BILATERAL KNEES;  Surgeon: Loanne Drilling, MD;  Location: [facility];  Service: Orthopedics;  Laterality: Bilateral;   REDUCTION MAMMAPLASTY     TONSILLECTOMY AND ADENOIDECTOMY  as child   TOTAL KNEE ARTHROPLASTY Bilateral 03/17/2013   Procedure: TOTAL KNEE BILATERAL;  Surgeon: Loanne Drilling, MD;  Location: [facility];  Service: Orthopedics;  Laterality: Bilateral;   URETERAL REIMPLANTION Right 09/13/2014   Procedure: OPEN RIGHT URETERAL REIMPLANT;  Surgeon: Crist Fat, MD;  Location: [facility];  Service: Urology;  Laterality: Right;  VAGINAL HYSTERECTOMY  2000   w/ right salpingoophorectomy    OB History   No obstetric history on file.      Home Medications    Prior to Admission medications   Medication Sig Start Date End Date Taking? Authorizing Provider  valACYclovir (VALTREX) 1000 MG tablet Take 1 tablet (1,000 mg total) by mouth 3 (three) times daily. 05/29/22  Yes Tomi Bamberger, PA-C  B Complex Vitamins (B COMPLEX 50) TABS daily 01/29/16   Burns, Bobette Mo, MD  Biotin 1000 MCG tablet     [provider]  Calcium Carb-Cholecalciferol (CALCIUM 600 + D PO)     [provider]  Cholecalciferol (VITAMIN D3) 1000 units CAPS daily 01/29/16   Burns, Bobette Mo, MD  citalopram (CELEXA) 20 MG tablet TAKE 1 TABLET (20 MG TOTAL) BY MOUTH EVERY MORNING. 04/11/21   Pincus Sanes, MD  famotidine (PEPCID) 40 MG tablet TAKE 1 TABLET TWICE DAILY 11/14/21   Pincus Sanes, MD  fenofibrate 160 MG tablet TAKE 1 TABLET (160 MG TOTAL) BY MOUTH EVERY MORNING. 04/11/21   Pincus Sanes, MD  hydrochlorothiazide (HYDRODIURIL) 25 MG tablet Take 1 tablet (25 mg total) by mouth daily. 04/24/22   Pincus Sanes, MD  pantoprazole (PROTONIX) 40 MG tablet TAKE 1 TABLET TWICE DAILY BEFORE MEALS 11/14/21   Pincus Sanes, MD  VAGINAL LUBRICANT [facility] vaginally. Patient currently using GYNATROF suppositories    [provider]  Zinc 50 MG CAPS     [provider]    Family History Family History  Problem Relation Age of Onset   Diabetes Mother        Deceased   Heart attack Mother    Dementia Mother    Hypertension Father        Deceased   Lung cancer Father    Cancer Other    Heart Problems Brother        CABG   Bipolar disorder Sister    Healthy Daughter    Breast cancer Maternal Grandmother     Social History Social History   Tobacco Use   Smoking status: Never   Smokeless tobacco: Never  Vaping Use   Vaping Use: Never used  Substance Use Topics   Alcohol use: No    Alcohol/week: 0.0 standard drinks of alcohol   Drug use: No     Allergies   Advil [ibuprofen], Mixed feathers, Naproxen, and Shrimp [shellfish allergy]   Review of Systems Review of Systems  Constitutional:  Negative for chills and fever.  Eyes:  Negative for discharge and redness.  Respiratory:  Negative for shortness of breath.   Gastrointestinal:  Negative for nausea and vomiting.  Skin:  Positive for rash.     Physical Exam Triage Vital Signs ED Triage Vitals  Enc Vitals Group     BP      Pulse       Resp      Temp      Temp src      SpO2      Weight      Height      Head Circumference      Peak Flow      Pain Score      Pain Loc      Pain Edu?      Excl. in GC?    No data found.  Updated Vital Signs BP 132/84 (BP Location: Left Arm)   Pulse 70  Temp 98.1 F (36.7 C) (Oral)   Resp 15   SpO2 95%       Physical Exam Vitals and nursing note reviewed.  Constitutional:      General: She is not in acute distress.    Appearance: Normal appearance. She is not ill-appearing.  HENT:     Head: Normocephalic and atraumatic.  Eyes:     Conjunctiva/sclera: Conjunctivae normal.  Cardiovascular:     Rate and Rhythm: Normal rate.  Pulmonary:     Effort: Pulmonary effort is normal.  Skin:    General: Skin is warm and dry.     Findings: Rash (mildly erythematous clustered vesicular rash to volar right distal forearm) present.  Neurological:     Mental Status: She is alert.  Psychiatric:        Mood and Affect: Mood normal.        Behavior: Behavior normal.        Thought Content: Thought content normal.      UC Treatments / Results  Labs (all labs ordered are listed, but only abnormal results are displayed) Labs Reviewed - No data to display  EKG   Radiology No results found.  Procedures Procedures (including critical care time)  Medications Ordered in UC Medications - No data to display  Initial Impression / Assessment and Plan / UC Course  I have reviewed the triage vital signs and the nursing notes.  Pertinent labs & imaging results that were available during my care of the patient were reviewed by me and considered in my medical decision making (see chart for details).    Suspect rash is due to shingles and will treat with Valtrex.  Recommended follow-up if no gradual improvement with any worsening symptoms.  Patient expresses understanding.  Final Clinical Impressions(s) / UC Diagnoses   Final diagnoses:  Herpes zoster without complication    Discharge Instructions   None    ED Prescriptions     Medication Sig Dispense Auth. Provider   valACYclovir (VALTREX) 1000 MG tablet Take 1 tablet (1,000 mg total) by mouth 3 (three) times daily. 21 tablet Francene Finders, PA-C      PDMP not reviewed this encounter.   Francene Finders, PA-C 05/29/22 [phone]

## 2022-05-29 NOTE — ED Triage Notes (Signed)
Pt c/o skin lesion x 1 week to right forearm. Reports using steroid cream without resolution. Reports it is pruritic and painful.

## 2022-05-31 ENCOUNTER — Encounter: Payer: Self-pay | Admitting: Internal Medicine

## 2022-06-01 DIAGNOSIS — B029 Zoster without complications: Secondary | ICD-10-CM | POA: Insufficient documentation

## 2022-06-01 NOTE — Patient Instructions (Signed)
      care provider. Use techniques to help pop your ears as recommended by your health care provider. These may include: Chewing gum. Yawning. Frequent, forceful swallowing. Closing your mouth, holding your nose closed, and gently  blowing as if you are trying to blow air out of your nose. Keep all follow-up visits. This is important. Contact a health care provider if: Your symptoms do not go away after treatment. Your symptoms come back after treatment. You are unable to pop your ears. You have: A fever. Pain in your ear. Pain in your head or neck. Fluid draining from your ear. Your hearing suddenly changes. You become very dizzy. You lose your balance. Get help right away if: You have a sudden, severe increase in any of your symptoms. Summary Eustachian tube dysfunction refers to a condition in which a blockage develops in the eustachian tube. It can be caused by ear infections, allergies, inhaled irritants, or abnormal growths in the nose or throat. Symptoms may include ear pain or fullness, hearing loss, or ringing in the ears. Mild cases are treated with techniques to unblock the ears, such as yawning or chewing gum. More severe cases are treated with medicines or procedures. This information is not intended to replace advice given to you by your health care provider. Make sure you discuss any questions you have with your health care provider. Document Revised: 11/20/2020 Document Reviewed: 11/20/2020 Elsevier Patient Education  2023 Elsevier Inc.  

## 2022-06-01 NOTE — Progress Notes (Signed)
    Subjective:    Patient ID: Jennifer Sanford, female    DOB: 1951-01-10, 71 y.o.   MRN: [id number]      HPI Azha is here for No chief complaint on file.    Rash on arm -  it started last week with 3 small bumps on her right lower arm.  It was itchy.  Went to ED 9/6 and was diagnosed with shingles.  She was prescribed valtrex.     Medications and allergies reviewed with patient and updated if appropriate.  Current Outpatient Medications on File Prior to Visit  Medication Sig Dispense Refill   B Complex Vitamins (B COMPLEX 50) TABS daily  0   Biotin 1000 MCG tablet      Calcium Carb-Cholecalciferol (CALCIUM 600 + D PO)      Cholecalciferol (VITAMIN D3) 1000 units CAPS daily 60 capsule    citalopram (CELEXA) 20 MG tablet TAKE 1 TABLET (20 MG TOTAL) BY MOUTH EVERY MORNING. 90 tablet 3   famotidine (PEPCID) 40 MG tablet TAKE 1 TABLET TWICE DAILY 180 tablet 3   fenofibrate 160 MG tablet TAKE 1 TABLET (160 MG TOTAL) BY MOUTH EVERY MORNING. 90 tablet 3   hydrochlorothiazide (HYDRODIURIL) 25 MG tablet Take 1 tablet (25 mg total) by mouth daily. 90 tablet 3   pantoprazole (PROTONIX) 40 MG tablet TAKE 1 TABLET TWICE DAILY BEFORE MEALS 180 tablet 3   VAGINAL LUBRICANT [facility] vaginally. Patient currently using GYNATROF suppositories     valACYclovir (VALTREX) 1000 MG tablet Take 1 tablet (1,000 mg total) by mouth 3 (three) times daily. 21 tablet 0   Zinc 50 MG CAPS      No current facility-administered medications on file prior to visit.    Review of Systems     Objective:  There were no vitals filed for this visit. BP Readings from Last 3 Encounters:  05/29/22 132/84  04/24/22 116/78  08/27/21 122/72   Wt Readings from Last 3 Encounters:  04/24/22 216 lb (98 kg)  08/27/21 212 lb (96.2 kg)  06/28/20 211 lb (95.7 kg)   There is no height or weight on file to calculate BMI.    Physical Exam         Assessment & Plan:    See Problem List for Assessment and  Plan of chronic medical problems.     This encounter was created in error - please disregard.

## 2022-06-04 ENCOUNTER — Encounter: Payer: Medicare HMO | Admitting: Internal Medicine

## 2022-06-04 DIAGNOSIS — B029 Zoster without complications: Secondary | ICD-10-CM

## 2022-06-05 ENCOUNTER — Ambulatory Visit: Payer: Medicare HMO

## 2022-06-17 ENCOUNTER — Ambulatory Visit
Admission: RE | Admit: 2022-06-17 | Discharge: 2022-06-17 | Disposition: A | Discharge status: Home / Self Care | Payer: Medicare HMO | Source: Ambulatory Visit | Attending: Internal Medicine | Admitting: Internal Medicine

## 2022-06-17 DIAGNOSIS — Z1231 Encounter for screening mammogram for malignant neoplasm of breast: Secondary | ICD-10-CM | POA: Diagnosis not present

## 2022-06-20 ENCOUNTER — Ambulatory Visit: Payer: Medicare HMO

## 2022-06-22 ENCOUNTER — Emergency Department (HOSPITAL_BASED_OUTPATIENT_CLINIC_OR_DEPARTMENT_OTHER): Payer: Medicare HMO

## 2022-06-22 ENCOUNTER — Other Ambulatory Visit: Payer: Self-pay

## 2022-06-22 ENCOUNTER — Emergency Department (HOSPITAL_BASED_OUTPATIENT_CLINIC_OR_DEPARTMENT_OTHER)
Admission: EM | Admit: 2022-06-22 | Discharge: 2022-06-22 | Disposition: A | Discharge status: Home / Self Care | Payer: Medicare HMO | Attending: Emergency Medicine | Admitting: Emergency Medicine

## 2022-06-22 ENCOUNTER — Encounter (HOSPITAL_BASED_OUTPATIENT_CLINIC_OR_DEPARTMENT_OTHER): Payer: Self-pay | Admitting: Emergency Medicine

## 2022-06-22 DIAGNOSIS — W19XXXA Unspecified fall, initial encounter: Secondary | ICD-10-CM

## 2022-06-22 DIAGNOSIS — S0990XA Unspecified injury of head, initial encounter: Secondary | ICD-10-CM | POA: Diagnosis not present

## 2022-06-22 DIAGNOSIS — S098XXA Other specified injuries of head, initial encounter: Secondary | ICD-10-CM | POA: Diagnosis not present

## 2022-06-22 DIAGNOSIS — S0181XA Laceration without foreign body of other part of head, initial encounter: Secondary | ICD-10-CM

## 2022-06-22 DIAGNOSIS — W01198A Fall on same level from slipping, tripping and stumbling with subsequent striking against other object, initial encounter: Secondary | ICD-10-CM | POA: Diagnosis not present

## 2022-06-22 DIAGNOSIS — M25521 Pain in right elbow: Secondary | ICD-10-CM | POA: Diagnosis not present

## 2022-06-22 DIAGNOSIS — Z23 Encounter for immunization: Secondary | ICD-10-CM | POA: Diagnosis not present

## 2022-06-22 DIAGNOSIS — Y92481 Parking lot as the place of occurrence of the external cause: Secondary | ICD-10-CM | POA: Diagnosis not present

## 2022-06-22 DIAGNOSIS — I10 Essential (primary) hypertension: Secondary | ICD-10-CM | POA: Diagnosis not present

## 2022-06-22 DIAGNOSIS — Z79899 Other long term (current) drug therapy: Secondary | ICD-10-CM | POA: Insufficient documentation

## 2022-06-22 DIAGNOSIS — S0993XA Unspecified injury of face, initial encounter: Secondary | ICD-10-CM | POA: Diagnosis not present

## 2022-06-22 DIAGNOSIS — S199XXA Unspecified injury of neck, initial encounter: Secondary | ICD-10-CM | POA: Diagnosis not present

## 2022-06-22 LAB — CBC WITH DIFFERENTIAL/PLATELET
Abs Immature Granulocytes: 0.01 10*3/uL (ref 0.00–0.07)
Basophils Absolute: 0 10*3/uL (ref 0.0–0.1)
Basophils Relative: 0 %
Eosinophils Absolute: 0.1 10*3/uL (ref 0.0–0.5)
Eosinophils Relative: 1 %
HCT: 38.3 % (ref 36.0–46.0)
Hemoglobin: 12.6 g/dL (ref 12.0–15.0)
Immature Granulocytes: 0 %
Lymphocytes Relative: 37 %
Lymphs Abs: 3.1 10*3/uL (ref 0.7–4.0)
MCH: 29.8 pg (ref 26.0–34.0)
MCHC: 32.9 g/dL (ref 30.0–36.0)
MCV: 90.5 fL (ref 80.0–100.0)
Monocytes Absolute: 0.7 10*3/uL (ref 0.1–1.0)
Monocytes Relative: 8 %
Neutro Abs: 4.5 10*3/uL (ref 1.7–7.7)
Neutrophils Relative %: 54 %
Platelets: 289 10*3/uL (ref 150–400)
RBC: 4.23 MIL/uL (ref 3.87–5.11)
RDW: 15.1 % (ref 11.5–15.5)
WBC: 8.4 10*3/uL (ref 4.0–10.5)
nRBC: 0 % (ref 0.0–0.2)

## 2022-06-22 LAB — URINALYSIS, COMPLETE (UACMP) WITH MICROSCOPIC
Bilirubin Urine: NEGATIVE
Glucose, UA: NEGATIVE mg/dL
Hgb urine dipstick: NEGATIVE
Ketones, ur: NEGATIVE mg/dL
Nitrite: NEGATIVE
Protein, ur: NEGATIVE mg/dL
Specific Gravity, Urine: 1.023 (ref 1.005–1.030)
pH: 7.5 (ref 5.0–8.0)

## 2022-06-22 LAB — COMPREHENSIVE METABOLIC PANEL
ALT: 16 U/L (ref 0–44)
AST: 24 U/L (ref 15–41)
Albumin: 4.4 g/dL (ref 3.5–5.0)
Alkaline Phosphatase: 52 U/L (ref 38–126)
Anion gap: 10 (ref 5–15)
BUN: 26 mg/dL — ABNORMAL HIGH (ref 8–23)
CO2: 30 mmol/L (ref 22–32)
Calcium: 9.9 mg/dL (ref 8.9–10.3)
Chloride: 101 mmol/L (ref 98–111)
Creatinine, Ser: 1.19 mg/dL — ABNORMAL HIGH (ref 0.44–1.00)
GFR, Estimated: 50 mL/min — ABNORMAL LOW (ref >60)
Glucose, Bld: 89 mg/dL (ref 70–99)
Potassium: 3.8 mmol/L (ref 3.5–5.1)
Sodium: 141 mmol/L (ref 135–145)
Total Bilirubin: 0.4 mg/dL (ref 0.3–1.2)
Total Protein: 7.5 g/dL (ref 6.5–8.1)

## 2022-06-22 LAB — TROPONIN I (HIGH SENSITIVITY): Troponin I (High Sensitivity): 2 ng/L (ref <18)

## 2022-06-22 MED ORDER — LIDOCAINE HCL (PF) 1 % IJ SOLN
10.0000 mL | Freq: Once | INTRAMUSCULAR | Status: AC
  Administered 2022-06-22: 10 mL
  Filled 2022-06-22: qty 10

## 2022-06-22 MED ORDER — TETANUS-DIPHTH-ACELL PERTUSSIS 5-2.5-18.5 LF-MCG/0.5 IM SUSY
0.5000 mL | PREFILLED_SYRINGE | Freq: Once | INTRAMUSCULAR | Status: AC
  Administered 2022-06-22: 0.5 mL via INTRAMUSCULAR
  Filled 2022-06-22: qty 0.5

## 2022-06-22 MED ORDER — HYDROCODONE-ACETAMINOPHEN 5-325 MG PO TABS
1.0000 | ORAL_TABLET | Freq: Once | ORAL | Status: AC
  Administered 2022-06-22: 1 via ORAL
  Filled 2022-06-22: qty 1

## 2022-06-22 NOTE — ED Triage Notes (Signed)
Fell in parking lot on cement ,hit right side head,right elbow. Reports dizziness and headache, happened around 1130.

## 2022-06-22 NOTE — ED Triage Notes (Signed)
Over past year has fallen 3 to 4 times hitting her head. Wants CT scan

## 2022-06-22 NOTE — ED Notes (Signed)
Discharge paperwork given and verbally understood. 

## 2022-06-22 NOTE — Discharge Instructions (Addendum)
Please follow-up with your primary care provider.  You had stitches placed today, they are absorbable, and should dissolve on their own.  If you have any severe nausea, vomiting, confusion please return to the ER.  Use a cold ice pack to help with the swelling of your face.  And take Tylenol for headache.  If you develop redness, swelling, heat at the site please return to the ER this is a sign of possible infection around your stitches.  Have someone with you for the next 24 hours checking on you every 2 hours to ensure that your mental status is stable.

## 2022-06-22 NOTE — ED Provider Notes (Signed)
[facility]   CSN: [id number] Arrival date & time: 06/22/22  1234     History  Chief Complaint  Patient presents with   Wyman SongsterFall    Malasia N Smikle Buschman is a 71 y.o. female, history of prediabetes, who presents to the ED secondary to a fall that occurred around 1130 today after working out.  She states she was in the parking lot talking on the phone, when she tripped over her feet, and hit the right side of her face on the concrete.  She denies any loss of consciousness, but states she has been dizzy since that episode.  She is not on any blood thinners.  Denies any back pain, neck pain, extremities pain.  Does state that she hit her right elbow, that has minimal pain other than the abrasion.  Tetanus is unknown.  Has not had any nausea or vomiting.  Reports a mild to moderate headache.  No increased confusion. Denies any difficulty speech, weakness on one side of the body, pain or shortness of breath. Denies any dizziness prior to fall.  Fall Associated symptoms include headaches.      Home Medications Prior to Admission medications   Medication Sig Start Date End Date Taking? Authorizing Provider  B Complex Vitamins (B COMPLEX 50) TABS daily 01/29/16   Burns, Bobette MoStacy J, MD  Biotin 1000 MCG tablet     [provider]  Calcium Carb-Cholecalciferol (CALCIUM 600 + D PO)     [provider]  Cholecalciferol (VITAMIN D3) 1000 units CAPS daily 01/29/16   Burns, Bobette MoStacy J, MD  citalopram (CELEXA) 20 MG tablet TAKE 1 TABLET (20 MG TOTAL) BY MOUTH EVERY MORNING. 04/11/21   Pincus SanesBurns, Stacy J, MD  famotidine (PEPCID) 40 MG tablet TAKE 1 TABLET TWICE DAILY 11/14/21   Pincus SanesBurns, Stacy J, MD  fenofibrate 160 MG tablet TAKE 1 TABLET (160 MG TOTAL) BY MOUTH EVERY MORNING. 04/11/21   Pincus SanesBurns, Stacy J, MD  hydrochlorothiazide (HYDRODIURIL) 25 MG tablet Take 1 tablet (25 mg total) by mouth daily. 04/24/22   Pincus SanesBurns, Stacy J, MD  pantoprazole (PROTONIX) 40 MG tablet  TAKE 1 TABLET TWICE DAILY BEFORE MEALS 11/14/21   Pincus SanesBurns, Stacy J, MD  VAGINAL LUBRICANT [facility] vaginally. Patient currently using GYNATROF suppositories    [provider]  valACYclovir (VALTREX) 1000 MG tablet Take 1 tablet (1,000 mg total) by mouth 3 (three) times daily. 05/29/22   Tomi BambergerMyers, Rebecca F, PA-C  Zinc 50 MG CAPS     [provider]      Allergies    Advil [ibuprofen], Mixed feathers, Naproxen, and Shrimp [shellfish allergy]    Review of Systems   Review of Systems  Skin:  Positive for wound.  Neurological:  Positive for dizziness and headaches. Negative for syncope, speech difficulty and weakness.    Physical Exam Updated Vital Signs BP 132/75 (BP Location: Left Arm)   Pulse 60   Temp 98.1 F (36.7 C) (Oral)   Resp 17   SpO2 100%  Physical Exam Constitutional:      Appearance: Normal appearance.  HENT:     Head: Normocephalic.     Right Ear: Tympanic membrane normal.     Left Ear: Tympanic membrane normal.     Nose: Nose normal.     Mouth/Throat:     Mouth: Mucous membranes are moist.  Eyes:     Extraocular Movements: Extraocular movements intact.     Conjunctiva/sclera: Conjunctivae normal.  Pupils: Pupils are equal, round, and reactive to light.  Cardiovascular:     Rate and Rhythm: Normal rate and regular rhythm.     Pulses: Normal pulses.  Pulmonary:     Effort: Pulmonary effort is normal.     Breath sounds: Normal breath sounds.  Abdominal:     General: Abdomen is flat.     Palpations: Abdomen is soft.  Musculoskeletal:        General: Normal range of motion.     Cervical back: Normal range of motion and neck supple.     Comments: Mild ttp of R olecranon, supination, pronation, flexion, extension intact. No ttp of wrist or hand including snuffbox ttp.   Skin:    General: Skin is warm.     Capillary Refill: Capillary refill takes less than 2 seconds.     Findings: Abrasion present.     Comments: +1.5 cm abrasion lateral to R eye  and lateral canthus, 1.5 cm abrasion to R temple   Neurological:     General: No focal deficit present.     Mental Status: She is alert and oriented to person, place, and time.     Cranial Nerves: No cranial nerve deficit.     Sensory: No sensory deficit.     Motor: Weakness present.     Coordination: Coordination normal.     Gait: Gait normal.  Psychiatric:        Mood and Affect: Mood normal.     ED Results / Procedures / Treatments   Labs (all labs ordered are listed, but only abnormal results are displayed) Labs Reviewed  COMPREHENSIVE METABOLIC PANEL - Abnormal; Notable for the following components:      Result Value   BUN 26 (*)    Creatinine, Ser 1.19 (*)    GFR, Estimated 50 (*)    All other components within normal limits  URINALYSIS, COMPLETE (UACMP) WITH MICROSCOPIC - Abnormal; Notable for the following components:   Leukocytes,Ua TRACE (*)    All other components within normal limits  CBC WITH DIFFERENTIAL/PLATELET  TROPONIN I (HIGH SENSITIVITY)    EKG EKG Interpretation  Date/Time:  Saturday June 22 2022 14:39:23 EDT Ventricular Rate:  58 PR Interval:  157 QRS Duration: 94 QT Interval:  444 QTC Calculation: 437 R Axis:   44 Text Interpretation: Sinus rhythm Probable left atrial enlargement No significant change since last tracing Confirmed by Ernie Avena ([id number]) on 06/22/2022 2:55:04 PM  Radiology CT Cervical Spine Wo Contrast  Result Date: 06/22/2022 CLINICAL DATA:  Neck trauma (Age >= 65y).  Fall EXAM: CT CERVICAL SPINE WITHOUT CONTRAST TECHNIQUE: Multidetector CT imaging of the cervical spine was performed without intravenous contrast. Multiplanar CT image reconstructions were also generated. RADIATION DOSE REDUCTION: This exam was performed according to the departmental dose-optimization program which includes automated exposure control, adjustment of the mA and/or kV according to patient size and/or use of iterative reconstruction technique.  COMPARISON:  MRI cervical spine 03/17/2021 FINDINGS: Alignment: Normal. Skull base and vertebrae: Multilevel mild-to-moderate degenerative changes of the spine. Associated severe osseous neural foraminal stenosis at the C5-C6 level and at least moderate osseous neural foraminal stenosis at the right C4-C5 level. No severe osseous central canal stenosis. No acute fracture. No aggressive appearing focal osseous lesion or focal pathologic process. Soft tissues and spinal canal: No prevertebral fluid or swelling. No visible canal hematoma. Upper chest: Unremarkable. Other: None. IMPRESSION: No acute displaced fracture or traumatic listhesis of the cervical spine. Electronically Signed  By: Iven Finn M.D.   On: 06/22/2022 15:25   CT Maxillofacial Wo Contrast  Result Date: 06/22/2022 : CLINICAL DATA: Facial trauma, fall EXAM: CT HEAD WITHOUT CONTRAST CT MAXILLOFACIAL WITHOUT CONTRAST TECHNIQUE: Multidetector CT imaging of the head and maxillofacial structures were performed using the standard protocol without intravenous contrast. Multiplanar CT image reconstructions of the maxillofacial structures were also generated. RADIATION DOSE REDUCTION: This exam was performed according to the departmental dose-optimization program which includes automated exposure control, adjustment of the mA and/or kV according to patient size and/or use of iterative reconstruction technique. COMPARISON: None Available. FINDINGS: CT HEAD FINDINGS Brain: No acute intracranial hemorrhage. No focal mass lesion. No CT evidence of acute infarction. No midline shift or mass effect. No hydrocephalus. Basilar cisterns are patent. Vascular: No hyperdense vessel or unexpected calcification. Skull: Normal. Negative for fracture or focal lesion. Sinuses/Orbits: Paranasal sinuses and mastoid air cells are clear. Orbits are clear. Other: None. CT MAXILLOFACIAL FINDINGS Osseous: No facial bone fracture. Mandibular condyles are located. Orbits:  Negative. No traumatic or inflammatory finding. Sinuses: Clear. No fluid in the paranasal sinuses. Soft tissues: Negative. IMPRESSION: 1. No intracranial trauma. 2. No facial bone fracture. Electronically Signed   By: Suzy Bouchard M.D.   On: 06/22/2022 15:06   DG ELBOW COMPLETE RIGHT (3+VIEW)  Result Date: 06/22/2022 CLINICAL DATA:  Fall, pain EXAM: RIGHT ELBOW - COMPLETE 3+ VIEW COMPARISON:  None Available. FINDINGS: There is no evidence of fracture, dislocation, or joint effusion. Mild humeroulnar arthrosis. Soft tissues are unremarkable. IMPRESSION: No fracture or dislocation of the right elbow. No elbow joint effusion. Electronically Signed   By: Delanna Ahmadi M.D.   On: 06/22/2022 14:58   CT Head Wo Contrast  Result Date: 06/22/2022 CLINICAL DATA:  Facial trauma, fall EXAM: CT HEAD WITHOUT CONTRAST CT MAXILLOFACIAL WITHOUT CONTRAST TECHNIQUE: Multidetector CT imaging of the head and maxillofacial structures were performed using the standard protocol without intravenous contrast. Multiplanar CT image reconstructions of the maxillofacial structures were also generated. RADIATION DOSE REDUCTION: This exam was performed according to the departmental dose-optimization program which includes automated exposure control, adjustment of the mA and/or kV according to patient size and/or use of iterative reconstruction technique. COMPARISON:  None Available. FINDINGS: CT HEAD FINDINGS Brain: No acute intracranial hemorrhage. No focal mass lesion. No CT evidence of acute infarction. No midline shift or mass effect. No hydrocephalus. Basilar cisterns are patent. Vascular: No hyperdense vessel or unexpected calcification. Skull: Normal. Negative for fracture or focal lesion. Sinuses/Orbits: Paranasal sinuses and mastoid air cells are clear. Orbits are clear. Other: None. CT MAXILLOFACIAL FINDINGS Osseous: No facial bone fracture.  Mandibular condyles are located. Orbits: Negative. No traumatic or inflammatory  finding. Sinuses: Clear.  No fluid in the paranasal sinuses. Soft tissues: Negative. IMPRESSION: 1. No intracranial trauma. 2. No facial bone fracture. Electronically Signed   By: Suzy Bouchard M.D.   On: 06/22/2022 14:51    Procedures .Marland KitchenLaceration Repair  Date/Time: 06/22/2022 4:18 PM  Performed by: Osvaldo Shipper, PA Authorized by: Osvaldo Shipper, PA   Consent:    Consent obtained:  Verbal   Consent given by:  Patient   Risks discussed:  Infection, need for additional repair, pain, poor cosmetic result and poor wound healing   Alternatives discussed:  No treatment and delayed treatment Universal protocol:    Procedure explained and questions answered to patient or proxy's satisfaction: yes     Relevant documents present and verified: yes     Test results  available: yes     Imaging studies available: yes     Required blood products, implants, devices, and special equipment available: yes     Site/side marked: yes     Immediately prior to procedure, a time out was called: yes     Patient identity confirmed:  Verbally with patient Anesthesia:    Anesthesia method:  Local infiltration   Local anesthetic:  Lidocaine 1% w/o epi Laceration details:    Location:  Face   Facial location: lateral to R eye, R temple.   Wound length (cm): each is 1.5cm. Treatment:    Area cleansed with:  Povidone-iodine   Amount of cleaning:  Standard   Irrigation solution:  Sterile saline   Irrigation method:  Syringe   Visualized foreign bodies/material removed: no   Skin repair:    Repair method:  Sutures   Suture size:  5-0   Wound skin closure material used: vicryl rapide.   Suture technique:  Simple interrupted   Number of sutures:  6 (3 in each laceration) Approximation:    Approximation:  Close Repair type:    Repair type:  Simple Post-procedure details:    Dressing:  Open (no dressing)     Medications Ordered in ED Medications  Tdap (BOOSTRIX) injection 0.5 mL (0.5 mLs  Intramuscular Given 06/22/22 1445)  HYDROcodone-acetaminophen (NORCO/VICODIN) 5-325 MG per tablet 1 tablet (1 tablet Oral Given 06/22/22 1442)  lidocaine (PF) (XYLOCAINE) 1 % injection 10 mL (10 mLs Infiltration Given 06/22/22 1445)    ED Course/ Medical Decision Making/ A&P                           Medical Decision Making Amount and/or Complexity of Data Reviewed Labs: ordered. Radiology: ordered.  Risk Prescription drug management.   This patient presents to the ED for concern of fall, head trauma  Lab Tests:  I Ordered, and personally interpreted labs.  The pertinent results include:  no pertinent findings   Imaging Studies ordered:  I ordered imaging studies including CT head, CT maxillofacial, and Cspine which showed no acute abnormalities  Cardiac Monitoring: / EKG:  The patient was maintained on a cardiac monitor.  I personally viewed and interpreted the cardiac monitored which showed an underlying rhythm of: sinus rhythm    Problem List / ED Course / Critical interventions / Medication management  I ordered medication including norco  for pain  Reevaluation of the patient after these medicines showed that the patient improved I have reviewed the patients home medicines and have made adjustments as needed    Test / Admission - Considered:  Patient is a 71 year old female, who fell earlier today, last tetanus unknown, updated while in the ER.  She has an abrasion to her right temple, and lateral to  R eye.  Imaging did not show any acute fractures, abnormalities.  No tenderness to nose, mouth.  X-ray of elbow was unremarkable.  She was sutured with absorbable sutures, advised to follow-up with PCP, discussed return precautions.  Emphasized importance of monitoring for the first 24 hours for mental stability/monitoring.  She is advised to take Tylenol for headache and use ice to help with the pain.  Not get sutures wet for the first 24 hours.  Sutures will dissolve on  their own.  Not eligible for admission secondary to well-appearing adult, with unremarkable trauma, and at baseline mentation.  No bleeds or fractures visualized on exam/CTs  Final Clinical Impression(s) /  ED Diagnoses Final diagnoses:  Traumatic injury of head, initial encounter  Facial laceration, initial encounter  Fall, initial encounter    Rx / DC Orders ED Discharge Orders     None         Janetta Vandoren, [place], PA 06/23/22 6440    Ernie Avena, MD 06/23/22 0725

## 2022-06-22 NOTE — ED Notes (Signed)
Patient transported to CT 

## 2022-06-25 ENCOUNTER — Encounter: Payer: Self-pay | Admitting: Internal Medicine

## 2022-06-25 NOTE — Progress Notes (Unsigned)
    Subjective:    Patient ID: Jennifer Sanford, female    DOB: Aug 05, 1951, 71 y.o.   MRN: [id number]      HPI Jennifer Sanford is here for No chief complaint on file.   Difficulty focusing, concerning facial movements     Medications and allergies reviewed with patient and updated if appropriate.  Current Outpatient Medications on File Prior to Visit  Medication Sig Dispense Refill   B Complex Vitamins (B COMPLEX 50) TABS daily  0   Biotin 1000 MCG tablet      Calcium Carb-Cholecalciferol (CALCIUM 600 + D PO)      Cholecalciferol (VITAMIN D3) 1000 units CAPS daily 60 capsule    citalopram (CELEXA) 20 MG tablet TAKE 1 TABLET (20 MG TOTAL) BY MOUTH EVERY MORNING. 90 tablet 3   famotidine (PEPCID) 40 MG tablet TAKE 1 TABLET TWICE DAILY 180 tablet 3   fenofibrate 160 MG tablet TAKE 1 TABLET (160 MG TOTAL) BY MOUTH EVERY MORNING. 90 tablet 3   hydrochlorothiazide (HYDRODIURIL) 25 MG tablet Take 1 tablet (25 mg total) by mouth daily. 90 tablet 3   pantoprazole (PROTONIX) 40 MG tablet TAKE 1 TABLET TWICE DAILY BEFORE MEALS 180 tablet 3   VAGINAL LUBRICANT [facility] vaginally. Patient currently using GYNATROF suppositories     valACYclovir (VALTREX) 1000 MG tablet Take 1 tablet (1,000 mg total) by mouth 3 (three) times daily. 21 tablet 0   Zinc 50 MG CAPS      No current facility-administered medications on file prior to visit.    Review of Systems     Objective:  There were no vitals filed for this visit. BP Readings from Last 3 Encounters:  06/22/22 132/75  05/29/22 132/84  04/24/22 116/78   Wt Readings from Last 3 Encounters:  04/24/22 216 lb (98 kg)  08/27/21 212 lb (96.2 kg)  06/28/20 211 lb (95.7 kg)   There is no height or weight on file to calculate BMI.    Physical Exam         Assessment & Plan:    See Problem List for Assessment and Plan of chronic medical problems.

## 2022-06-26 ENCOUNTER — Other Ambulatory Visit: Payer: Self-pay | Admitting: Internal Medicine

## 2022-06-26 ENCOUNTER — Ambulatory Visit (INDEPENDENT_AMBULATORY_CARE_PROVIDER_SITE_OTHER): Payer: Medicare HMO | Admitting: Internal Medicine

## 2022-06-26 VITALS — BP 116/68 | HR 61 | Temp 98.2°F | Ht 71.0 in | Wt 217.0 lb

## 2022-06-26 DIAGNOSIS — R41840 Attention and concentration deficit: Secondary | ICD-10-CM

## 2022-06-26 DIAGNOSIS — I10 Essential (primary) hypertension: Secondary | ICD-10-CM | POA: Diagnosis not present

## 2022-06-26 DIAGNOSIS — S060X0A Concussion without loss of consciousness, initial encounter: Secondary | ICD-10-CM | POA: Diagnosis not present

## 2022-06-26 DIAGNOSIS — S060X0D Concussion without loss of consciousness, subsequent encounter: Secondary | ICD-10-CM

## 2022-06-26 DIAGNOSIS — S0181XA Laceration without foreign body of other part of head, initial encounter: Secondary | ICD-10-CM | POA: Insufficient documentation

## 2022-06-26 DIAGNOSIS — S0181XD Laceration without foreign body of other part of head, subsequent encounter: Secondary | ICD-10-CM

## 2022-06-26 NOTE — Assessment & Plan Note (Signed)
Chronic Well-controlled Continue hydrochlorothiazide 25 mg daily She will continue to monitor blood pressure at home and try to stay well-hydrated If blood pressure is borderline low will consider decreasing hydrochlorothiazide, but for now I do not think this is necessary

## 2022-06-26 NOTE — Assessment & Plan Note (Signed)
Acute Had 2 facial lacerations as a result of a fall few days ago Evaluated in the ED-no fractures Dissolvable sutures placed and they are healing well Swelling is improving Continue to monitor for infection

## 2022-06-26 NOTE — Assessment & Plan Note (Signed)
States she has had difficulty concentrating for a while, but has always been able to do her work without difficulty and still can Focus seems to be worse recently-particularly notices it with tasks around the house-this is very bothersome to her-she states she gets things done, but can often have multiple things going at once She would be interested in medication Will refer to psychology for official testing and recommendations

## 2022-06-26 NOTE — Patient Instructions (Addendum)
    Increase your activities slowly.  If your headaches are increasing call me and decrease your activities.     Medications changes include :   none     A referral was ordered for psychology.     Someone from that office will call you to schedule an appointment.

## 2022-06-26 NOTE — Assessment & Plan Note (Addendum)
Acute Hit her head a few days ago-evaluated in the ED and imaging is negative for acute injury She did sustain 2 facial fractures Headaches have gotten better-advised adjusting her activities based on her headaches-she did take off of work and will not return until maybe next week Dizziness has resolved, no visual issues or nausea She has had concussions in the past Symptomatic treatment We did discuss fall prevention She will call with any questions

## 2022-07-04 ENCOUNTER — Ambulatory Visit: Payer: Medicare HMO | Admitting: Internal Medicine

## 2022-07-04 DIAGNOSIS — H43393 Other vitreous opacities, bilateral: Secondary | ICD-10-CM | POA: Diagnosis not present

## 2022-07-08 ENCOUNTER — Encounter: Payer: Self-pay | Admitting: Internal Medicine

## 2022-07-19 ENCOUNTER — Telehealth: Payer: Self-pay

## 2022-07-19 ENCOUNTER — Other Ambulatory Visit: Payer: Self-pay

## 2022-07-19 MED ORDER — HYDROCHLOROTHIAZIDE 25 MG PO TABS: 25.0000 mg | ORAL_TABLET | Freq: Every day | ORAL | 3 refills | Status: DC

## 2022-07-19 NOTE — Telephone Encounter (Signed)
Sent in today 

## 2022-07-19 NOTE — Telephone Encounter (Signed)
MEDICATION: hydrochlorothiazide (HYDRODIURIL) 25 MG tablet  PHARMACY: [facility]   Comments: Rx refill in [address], [facility] states they faxed a request earlier this week.   **Let patient know to contact pharmacy at the end of the day to make sure medication is ready. **  ** Please notify patient to allow 48-72 hours to process**  **Encourage patient to contact the pharmacy for refills or they can request refills through [facility]**

## 2022-07-23 DIAGNOSIS — Z0100 Encounter for examination of eyes and vision without abnormal findings: Secondary | ICD-10-CM | POA: Diagnosis not present

## 2022-07-23 DIAGNOSIS — H43393 Other vitreous opacities, bilateral: Secondary | ICD-10-CM | POA: Diagnosis not present

## 2022-07-24 ENCOUNTER — Other Ambulatory Visit: Payer: Self-pay | Admitting: Nephrology

## 2022-07-24 DIAGNOSIS — R7303 Prediabetes: Secondary | ICD-10-CM | POA: Diagnosis not present

## 2022-07-24 DIAGNOSIS — N1831 Chronic kidney disease, stage 3a: Secondary | ICD-10-CM

## 2022-07-24 DIAGNOSIS — Z9889 Other specified postprocedural states: Secondary | ICD-10-CM

## 2022-07-24 DIAGNOSIS — I129 Hypertensive chronic kidney disease with stage 1 through stage 4 chronic kidney disease, or unspecified chronic kidney disease: Secondary | ICD-10-CM | POA: Diagnosis not present

## 2022-07-24 DIAGNOSIS — N390 Urinary tract infection, site not specified: Secondary | ICD-10-CM | POA: Diagnosis not present

## 2022-07-24 DIAGNOSIS — K219 Gastro-esophageal reflux disease without esophagitis: Secondary | ICD-10-CM | POA: Diagnosis not present

## 2022-07-25 ENCOUNTER — Ambulatory Visit
Admission: RE | Admit: 2022-07-25 | Discharge: 2022-07-25 | Disposition: A | Discharge status: Home / Self Care | Payer: Medicare HMO | Source: Ambulatory Visit | Attending: Nephrology | Admitting: Nephrology

## 2022-07-25 DIAGNOSIS — Z9889 Other specified postprocedural states: Secondary | ICD-10-CM

## 2022-07-25 DIAGNOSIS — I129 Hypertensive chronic kidney disease with stage 1 through stage 4 chronic kidney disease, or unspecified chronic kidney disease: Secondary | ICD-10-CM

## 2022-07-25 DIAGNOSIS — R7303 Prediabetes: Secondary | ICD-10-CM

## 2022-07-25 DIAGNOSIS — K219 Gastro-esophageal reflux disease without esophagitis: Secondary | ICD-10-CM

## 2022-07-25 DIAGNOSIS — N1330 Unspecified hydronephrosis: Secondary | ICD-10-CM | POA: Diagnosis not present

## 2022-07-25 DIAGNOSIS — N1831 Chronic kidney disease, stage 3a: Secondary | ICD-10-CM

## 2022-08-06 DIAGNOSIS — H43393 Other vitreous opacities, bilateral: Secondary | ICD-10-CM | POA: Diagnosis not present

## 2022-09-03 ENCOUNTER — Encounter: Payer: Self-pay | Admitting: Internal Medicine

## 2022-09-03 DIAGNOSIS — R42 Dizziness and giddiness: Secondary | ICD-10-CM

## 2022-09-04 MED ORDER — MECLIZINE HCL 12.5 MG PO TABS
12.5000 mg | ORAL_TABLET | Freq: Three times a day (TID) | ORAL | 1 refills | Status: DC | PRN
  Filled 2022-09-04: qty 40, 7d supply, fill #0

## 2022-09-05 ENCOUNTER — Other Ambulatory Visit (HOSPITAL_COMMUNITY): Payer: Self-pay

## 2022-09-06 ENCOUNTER — Ambulatory Visit: Payer: Medicare HMO | Attending: Internal Medicine | Admitting: Physical Therapy

## 2022-09-06 DIAGNOSIS — H8112 Benign paroxysmal vertigo, left ear: Secondary | ICD-10-CM | POA: Diagnosis not present

## 2022-09-06 DIAGNOSIS — H8111 Benign paroxysmal vertigo, right ear: Secondary | ICD-10-CM | POA: Diagnosis not present

## 2022-09-06 DIAGNOSIS — R42 Dizziness and giddiness: Secondary | ICD-10-CM | POA: Diagnosis not present

## 2022-09-06 NOTE — Therapy (Signed)
OUTPATIENT PHYSICAL THERAPY VESTIBULAR EVALUATION     Patient Name: Jennifer Sanford MRN: [id number] DOB:03/20/1951, 71 y.o., female Today's Date: 09/06/2022  END OF SESSION:  PT End of Session - 09/06/22 1103     Visit Number 1    Number of Visits 4    Date for PT Re-Evaluation 10/06/22    Authorization Type [employer]    PT Start Time 1020    PT Stop Time 1100    PT Time Calculation (min) 40 min    Activity Tolerance Patient tolerated treatment well    Behavior During Therapy WFL for tasks assessed/performed             Past Medical History:  Diagnosis Date   Complication of anesthesia    VERY CLAUSTROPHIC W/ MASK   Depression    GERD (gastroesophageal reflux disease)    History of ureter repair    right ureteral reimplant for stricture 09-03-2014   Hx of transfusion of packed red blood cells    Hyperlipidemia    Hypertension    no meds s/p wt loss   OA (osteoarthritis)    Postop Transfusion 03/21/2013   Postoperative wound infection    Past Surgical History:  Procedure Laterality Date   BREAST REDUCTION SURGERY Bilateral 04-12-2008   CARDIOVASCULAR STRESS TEST  12-26-2009   Normal nuclear study/  no ischemia/  ef 65%   CYSTO/  RIGHT RETROGRADE PYELOGRAM/  BALLOON DILATION RIGHT URETER STRICTURE/  STENT PLACEMENT  05-25-2010//   03-14-2009//   01-28-2009   CYSTOSCOPY WITH STENT PLACEMENT Right 02/18/2014   Procedure: CYSTOSCOPY WITH right STENT PLACEMENT;  Surgeon: Danae Chen, MD;  Location: [facility];  Service: Urology;  Laterality: Right;   EXPLORATORY LAPAROTOMY W/ RIGHT URETER REPAIR  2000   INCISION AND DRAINAGE ABSCESS N/A 11/28/2014   Procedure: INCISION AND DRAINAGE ABSCESS;  Surgeon: Crist Fat, MD;  Location: [facility];  Service: Urology;  Laterality: N/A;   INCISION AND DRAINAGE ABSCESS N/A 02/15/2015   Procedure: INCISION AND DRAINAGE OF WOUND;  Surgeon: Crist Fat, MD;  Location: [facility];  Service: Urology;  Laterality: N/A;   KNEE ARTHROSCOPY  left  2004//   bilateral 1980   KNEE CLOSED REDUCTION Bilateral 06/02/2013   Procedure: CLOSED MANIPULATION BILATERAL KNEES;  Surgeon: Loanne Drilling, MD;  Location: [facility];  Service: Orthopedics;  Laterality: Bilateral;   REDUCTION MAMMAPLASTY     TONSILLECTOMY AND ADENOIDECTOMY  as child   TOTAL KNEE ARTHROPLASTY Bilateral 03/17/2013   Procedure: TOTAL KNEE BILATERAL;  Surgeon: Loanne Drilling, MD;  Location: [facility];  Service: Orthopedics;  Laterality: Bilateral;   URETERAL REIMPLANTION Right 09/13/2014   Procedure: OPEN RIGHT URETERAL REIMPLANT;  Surgeon: Crist Fat, MD;  Location: [facility];  Service: Urology;  Laterality: Right;   VAGINAL HYSTERECTOMY  2000   w/ right salpingoophorectomy   Patient Active Problem List   Diagnosis Date Noted   Concentration deficit 06/26/2022   Facial laceration 06/26/2022   Concussion with no loss of consciousness 06/26/2022   Shingles 06/01/2022   COVID-19 01/17/2021   Concussion 01/17/2021   CTS (carpal tunnel syndrome) 06/28/2020   Prediabetes 04/29/2018   Hypertriglyceridemia 03/10/2018   CKD (chronic kidney disease) stage 3, GFR 30-59 ml/min (HCC) 07/22/2017   Essential hypertension, benign 01/29/2016   Anxiety 01/29/2016   Depression 01/29/2016   GERD (gastroesophageal reflux disease) 01/29/2016   Ureteral stricture, right 09/20/2014   S/P  ureteral reimplantation 09/13/2014   S/P bilateral unicompartmental knee replacement 03/22/2013   OA (osteoarthritis) of knee 03/17/2013   PES PLANUS 08/28/2009    PCP: Pincus Sanes, MD REFERRING PROVIDER: Pincus Sanes, MD  REFERRING DIAG: R42 (ICD-10-CM) - Vertigo  THERAPY DIAG:  BPPV (benign paroxysmal positional vertigo), right  Dizziness and giddiness  ONSET DATE: 09/04/2022  Rationale for Evaluation and Treatment: Rehabilitation  SUBJECTIVE:   SUBJECTIVE STATEMENT: Reports that the dizziness has not  fully resolved from her fall. Has dizziness going from sitting to stand. Noticing when she turns in the bed, everything is spinning. Has to reach out to hold something to settle herself. Spinning will last about a minute. Has to keep her eyes closed. Gets dizzy coming up from laying down. Reports this is her 3rd or 4th concussion episode. Has had dizziness before, but has never experienced to this extent.  Would wait out the dizziness and it would subside, so never had PT before. Reports blood pressure is good and has worked this up with her PCP and it is not a orthostatic thing. Does not have any lightheadedness. Reports no issues with her balance. Has to be more deliberate with how she moves. Was prescribed Meclizine from her PCP, but has not picked it up yet. Reports pt has a funny feeling in her head  Pt accompanied by: self  PERTINENT HISTORY: Jennifer Sanford is a 71 y.o. female, history of prediabetes, who presents to the ED on 06/22/22 after a fall that occurred after working out.  She states she was in the parking lot talking on the phone, when she tripped over her feet, and hit the right side of her face on the concrete.  She denies any loss of consciousness, but states she has been dizzy since that episode.  Imaging did not show any acute fractures, abnormalities. No tenderness to nose, mouth. X-ray of elbow was unremarkable  PAIN:  Are you having pain? No  PRECAUTIONS: Fall  WEIGHT BEARING RESTRICTIONS: No  FALLS: Has patient fallen in last 6 months? Yes. Number of falls 1   PLOF: Independent  PATIENT GOALS: Figure out how to get rid of the dizziness, manage the dizziness.   OBJECTIVE:    COGNITION: Overall cognitive status: Within functional limits for tasks assessed    Cervical ROM:  AROM WFL     GAIT: Gait pattern: WFL Level of assistance: Complete Independence   PATIENT SURVEYS:  FOTO DPS: 48 (62 predicted), DFS: 56.6   VESTIBULAR ASSESSMENT:  GENERAL  OBSERVATION: Ambulates in with no AD independently.    SYMPTOM BEHAVIOR:  Subjective history: See above.   Non-Vestibular symptoms:  weight feeling in her head   Type of dizziness: Spinning/Vertigo  Frequency: Everyday when she rolls in bed, and comes up to sitting.   Duration: Minute or so   Aggravating factors: Induced by position change: rolling to the right, rolling to the left, supine to sit, and sit to stand and Induced by motion: turning body quickly and turning head quickly  Relieving factors: head stationary, slow movements, and staying still   Progression of symptoms: better  OCULOMOTOR EXAM:  Ocular Alignment: normal  Ocular ROM: No Limitations  Spontaneous Nystagmus: absent  Gaze-Induced Nystagmus: absent  Smooth Pursuits: intact  Saccades: intact    VESTIBULAR - OCULAR REFLEX:   Slow VOR: Normal  VOR Cancellation: Normal  Head-Impulse Test: HIT Right: negative HIT Left: negative   POSITIONAL TESTING: Right Dix-Hallpike: upbeating, right nystagmus and latency  period of about 5 seconds, then lasting about 10 seconds, less intense than in R sidelying  Right Sidelying: upbeating, right nystagmus and lasting approx. 15 seconds, incr intensity   After R sidelying, pt with a posterior reaction when coming back up to sitting, needing min A from therapist for steadying. Lasted about 10-15 seconds before pt felt steady again in sitting.    VESTIBULAR TREATMENT:                                                                                                   DATE: 09/07/11  Canalith Repositioning:  Epley Right: Number of Reps: 1, Response to Treatment: symptoms resolved, and Comment: Pt with dizziness in positions 1 and 3. Upon re-assessment after 1 rep, pt demonstrated resolution of symptoms.    PATIENT EDUCATION: Education details: Clinical findings, POC, Etiology of BPPV and how to treat BPPV with Epley maneuver, gave handout on BPPV, discussed resolution after 1 rep of  Epley for R posterior canal BPPV, but will want to schedule an additional follow up for re-assessment. Discussed trying to sleep on L side for the next couple days as pt normally sleeps on her R side.  Person educated: Patient Education method: Explanation, Verbal cues, and Handouts Education comprehension: verbalized understanding  HOME EXERCISE PROGRAM: Will provide next session as needed.   GOALS: Goals reviewed with patient? Yes  SHORT TERM GOALS: ALL STGS = LTGS   LONG TERM GOALS: Target date: 10/04/2022   1. Pt will demonstrate negative positional testing in order to demo resolution of BPPV. Baseline: R posterior canal BPPV Goal status: INITIAL  2.  Pt will verbalize/demonstrate understanding of self Epley maneuver for home.  Baseline:  Goal status: INITIAL  3.  Pt will improve DPS to 62 in order to demo improved functional outcomes. Baseline: 48 Goal status: INITIAL  ASSESSMENT:  CLINICAL IMPRESSION: Patient is a 71 year old female referred to Neuro OPPT for dizziness.   Pt's PMH is significant for: HLD, HTN, osteoarthritis. The following deficits were present during the exam: R upbeating rotary nystagmus in Dix-Hillpike and R Sidelying, indicating R posterior canal BPPV. Treated x1 rep of the Epley maneuver with pt demonstrating resolution of BPPV and having no nystagmus/dizziness afterwards. Will benefit from another session to re-assess.  Pt would benefit from skilled PT to address these impairments and functional limitations to decr dizziness.   OBJECTIVE IMPAIRMENTS: dizziness.   ACTIVITY LIMITATIONS: bending and bed mobility  PARTICIPATION LIMITATIONS:  N/A  PERSONAL FACTORS: Past/current experiences and Time since onset of injury/illness/exacerbation are also affecting patient's functional outcome.   REHAB POTENTIAL: Excellent  CLINICAL DECISION MAKING: Stable/uncomplicated  EVALUATION COMPLEXITY: Low   PLAN:  PT FREQUENCY: 1x/week  PT DURATION: 4  weeks  PLANNED INTERVENTIONS: Therapeutic exercises, Therapeutic activity, Neuromuscular re-education, Balance training, Gait training, Patient/Family education, Self Care, Vestibular training, and Canalith repositioning  PLAN FOR NEXT SESSION: Re-assess R posterior canal BPPV. And assess other canals. Educate on home epley maneuver    Drake Leach, PT, DPT  09/06/2022, 12:15 PM

## 2022-09-10 DIAGNOSIS — H43393 Other vitreous opacities, bilateral: Secondary | ICD-10-CM | POA: Diagnosis not present

## 2022-09-12 ENCOUNTER — Encounter: Payer: Self-pay | Admitting: Physical Therapy

## 2022-09-12 ENCOUNTER — Ambulatory Visit: Payer: Medicare HMO | Admitting: Physical Therapy

## 2022-09-12 DIAGNOSIS — R42 Dizziness and giddiness: Secondary | ICD-10-CM | POA: Diagnosis not present

## 2022-09-12 DIAGNOSIS — H8111 Benign paroxysmal vertigo, right ear: Secondary | ICD-10-CM

## 2022-09-12 DIAGNOSIS — H8112 Benign paroxysmal vertigo, left ear: Secondary | ICD-10-CM | POA: Diagnosis not present

## 2022-09-12 NOTE — Therapy (Signed)
OUTPATIENT PHYSICAL THERAPY VESTIBULAR TREATMENT     Patient Name: Jennifer Sanford MRN: [id number] DOB:[birth year], 71 y.o., female Today's Date: 09/12/2022  END OF SESSION:  PT End of Session - 09/12/22 0927     Visit Number 2    Number of Visits 4    Date for PT Re-Evaluation 10/06/22    Authorization Type [employer]    PT Start Time [phone]    PT Stop Time 1000   full time not used due to BPPV tx   PT Time Calculation (min) 35 min    Activity Tolerance Patient tolerated treatment well    Behavior During Therapy [facility] for tasks assessed/performed              Past Medical History:  Diagnosis Date   Complication of anesthesia    VERY CLAUSTROPHIC W/ MASK   Depression    GERD (gastroesophageal reflux disease)    History of ureter repair    right ureteral reimplant for stricture 09-03-2014   Hx of transfusion of packed red blood cells    Hyperlipidemia    Hypertension    no meds s/p wt loss   OA (osteoarthritis)    Postop Transfusion 03/21/2013   Postoperative wound infection    Past Surgical History:  Procedure Laterality Date   BREAST REDUCTION SURGERY Bilateral 04-12-2008   CARDIOVASCULAR STRESS TEST  12-26-2009   Normal nuclear study/  no ischemia/  ef 65%   CYSTO/  RIGHT RETROGRADE PYELOGRAM/  BALLOON DILATION RIGHT URETER STRICTURE/  STENT PLACEMENT  05-25-2010//   03-14-2009//   01-28-2009   CYSTOSCOPY WITH STENT PLACEMENT Right 02/18/2014   Procedure: CYSTOSCOPY WITH right STENT PLACEMENT;  Surgeon: Danae Chen, MD;  Location: [facility];  Service: Urology;  Laterality: Right;   EXPLORATORY LAPAROTOMY W/ RIGHT URETER REPAIR  2000   INCISION AND DRAINAGE ABSCESS N/A 11/28/2014   Procedure: INCISION AND DRAINAGE ABSCESS;  Surgeon: Crist Fat, MD;  Location: [facility];  Service: Urology;  Laterality: N/A;   INCISION AND DRAINAGE ABSCESS N/A 02/15/2015   Procedure: INCISION AND DRAINAGE OF WOUND;  Surgeon: Crist Fat, MD;  Location: [facility];  Service: Urology;  Laterality: N/A;   KNEE ARTHROSCOPY  left  2004//   bilateral 1980   KNEE CLOSED REDUCTION Bilateral 06/02/2013   Procedure: CLOSED MANIPULATION BILATERAL KNEES;  Surgeon: Loanne Drilling, MD;  Location: [facility];  Service: Orthopedics;  Laterality: Bilateral;   REDUCTION MAMMAPLASTY     TONSILLECTOMY AND ADENOIDECTOMY  as child   TOTAL KNEE ARTHROPLASTY Bilateral 03/17/2013   Procedure: TOTAL KNEE BILATERAL;  Surgeon: Loanne Drilling, MD;  Location: [facility];  Service: Orthopedics;  Laterality: Bilateral;   URETERAL REIMPLANTION Right 09/13/2014   Procedure: OPEN RIGHT URETERAL REIMPLANT;  Surgeon: Crist Fat, MD;  Location: [facility];  Service: Urology;  Laterality: Right;   VAGINAL HYSTERECTOMY  2000   w/ right salpingoophorectomy   Patient Active Problem List   Diagnosis Date Noted   Concentration deficit 06/26/2022   Facial laceration 06/26/2022   Concussion with no loss of consciousness 06/26/2022   Shingles 06/01/2022   COVID-19 01/17/2021   Concussion 01/17/2021   CTS (carpal tunnel syndrome) 06/28/2020   Prediabetes 04/29/2018   Hypertriglyceridemia 03/10/2018   CKD (chronic kidney disease) stage 3, GFR 30-59 ml/min (HCC) 07/22/2017   Essential hypertension, benign 01/29/2016   Anxiety 01/29/2016   Depression 01/29/2016   GERD (gastroesophageal reflux disease)  01/29/2016   Ureteral stricture, right 09/20/2014   S/P ureteral reimplantation 09/13/2014   S/P bilateral unicompartmental knee replacement 03/22/2013   OA (osteoarthritis) of knee 03/17/2013   PES PLANUS 08/28/2009    PCP: Jennifer Sanes, MD REFERRING PROVIDER: Pincus Sanes, MD  REFERRING DIAG: R42 (ICD-10-CM) - Vertigo  THERAPY DIAG:  BPPV (benign paroxysmal positional vertigo), right  BPPV (benign paroxysmal positional vertigo), left  Dizziness and giddiness  ONSET DATE: 09/04/2022  Rationale for Evaluation and Treatment:  Rehabilitation  SUBJECTIVE:   SUBJECTIVE STATEMENT: Reports that she is nowhere near as dizzy. Still getting dizzy at night or when getting up too quickly. Sudden position changes can still make her dizzy.   Pt accompanied by: self  PERTINENT HISTORY: Jennifer Sanford is a 71 y.o. female, history of prediabetes, who presents to the ED on 06/22/22 after a fall that occurred after working out.  She states she was in the parking lot talking on the phone, when she tripped over her feet, and hit the right side of her face on the concrete.  She denies any loss of consciousness, but states she has been dizzy since that episode.  Imaging did not show any acute fractures, abnormalities. No tenderness to nose, mouth. X-ray of elbow was unremarkable  PAIN:  Are you having pain? No  PRECAUTIONS: Fall  WEIGHT BEARING RESTRICTIONS: No  FALLS: Has patient fallen in last 6 months? Yes. Number of falls 1   PLOF: Independent  PATIENT GOALS: Figure out how to get rid of the dizziness, manage the dizziness.   OBJECTIVE:    VESTIBULAR TREATMENT:                                                                                                   DATE: 09/12/22  POSITIONAL TESTING: Right Dix-Hallpike: no nystagmus Left Dix-Hallpike: upbeating, left nystagmus and lasting approx. 10 seconds. Pt also with dizziness with return to upright and a posterior reaction, with min A needed from therapist for steadying and pt grabbing onto mat table.  Right Roll Test: no nystagmus Left Roll Test: no nystagmus  After 1 rep of L Epley maneuver, re-assessed DixHallpike and Sidelying positions with pt negative in each position with no dizziness. Pt only with dizziness/unsteadiness when sitting up from L Sidelying and L Dixhallpike with pt needing to grab out to table to balance herself.    Canalith Repositioning:  Epley Left: Number of Reps: 1, Response to Treatment: symptoms resolved, and Comment: Resolution of  BPPV after 1 rep.  Habituation: Performed L sidelying > sit x5 reps due to pt still having dizziness with coming upright, but none in testing position. Pt almost feeling like she was going to lose balance backwards and having to grab onto mat table. Pt improved with incr reps and upon the 4-5 rep, pt with minimal to no symptoms. Discussed potential lingering motion sensitivity and to try working on this at home.   PATIENT EDUCATION: Education details: Reviewed education for BPPV and how today it was on the L side and showed pt videos of nystagmus on  YouTube in regards to BPPV so she could see what her eyes are doing, education on L sidelying exercise for home for habituation, scheduling 1 additional appt after the holidays to re-assess. Educated on importance of staying hydrated  Person educated: Patient Education method: Explanation, Demonstration, and Verbal cues Education comprehension: verbalized understanding and returned demonstration  HOME EXERCISE PROGRAM: Will provide next session as needed.   GOALS: Goals reviewed with patient? Yes  SHORT TERM GOALS: ALL STGS = LTGS   LONG TERM GOALS: Target date: 10/04/2022   1. Pt will demonstrate negative positional testing in order to demo resolution of BPPV. Baseline: R posterior canal BPPV Goal status: INITIAL  2.  Pt will verbalize/demonstrate understanding of self Epley maneuver for home.  Baseline:  Goal status: INITIAL  3.  Pt will improve DPS to 62 in order to demo improved functional outcomes. Baseline: 48 Goal status: INITIAL  ASSESSMENT:  CLINICAL IMPRESSION: Re-assessed R posterior canal and pt was negative for BPPV and pt reporting less episodes and less intensity of dizziness episodes since she was last here. However, pt did have L upbeating rotary nystagmus in L Dixhallpike position that last about 10 seconds.Unsure if it converted or pt had bilateral BPPV to begin with (did not assess L side last session since pt was  positive on R side). Treated with x1 rep of L Epley maneuver with pt demonstrating resolution afterwards. Re-assessed all positional testing afterwards and pt negative in each position with no dizziness. Pt only with dizziness with return to upright from L sidelying and L Dixhallpike position. Performed habituation exercises from L sidelying > sit with pt with improved symptoms with incr reps. Added to pt's HEP. Scheduled one additional appt after the holidays to make sure that pt's canals stay cleared. Pt did not wish to learn the home Epley maneuver today and would rather wait to be re-assessed after she returns from traveling after the holidays. Pt felt better after tx after a seated rest break and able to ambulate out of session with no dizziness or imbalance. New cert sent to include diagnosis for L BPPV.   OBJECTIVE IMPAIRMENTS: dizziness.   ACTIVITY LIMITATIONS: bending and bed mobility  PARTICIPATION LIMITATIONS:  N/A  PERSONAL FACTORS: Past/current experiences and Time since onset of injury/illness/exacerbation are also affecting patient's functional outcome.   REHAB POTENTIAL: Excellent  CLINICAL DECISION MAKING: Stable/uncomplicated  EVALUATION COMPLEXITY: Low   PLAN:  PT FREQUENCY: 1x/week  PT DURATION: 4 weeks  PLANNED INTERVENTIONS: Therapeutic exercises, Therapeutic activity, Neuromuscular re-education, Balance training, Gait training, Patient/Family education, Self Care, Vestibular training, and Canalith repositioning  PLAN FOR NEXT SESSION: Re-assess canals Educate on home epley maneuver. Check LTGs/FOTO    Drake Leach, PT, DPT  09/12/2022, 10:05 AM

## 2022-09-27 ENCOUNTER — Ambulatory Visit: Payer: Medicare HMO | Admitting: Physical Therapy

## 2022-09-30 ENCOUNTER — Encounter: Payer: Medicare HMO | Admitting: Physical Therapy

## 2022-10-01 ENCOUNTER — Encounter: Payer: Self-pay | Admitting: Physical Therapy

## 2022-10-01 ENCOUNTER — Ambulatory Visit: Payer: Medicare HMO | Attending: Internal Medicine | Admitting: Physical Therapy

## 2022-10-01 DIAGNOSIS — R42 Dizziness and giddiness: Secondary | ICD-10-CM | POA: Insufficient documentation

## 2022-10-01 NOTE — Therapy (Signed)
OUTPATIENT PHYSICAL THERAPY VESTIBULAR TREATMENT     Patient Name: Jennifer Sanford MRN: [id number] DOB:1951/05/26, 72 y.o., female Today's Date: 10/01/2022  END OF SESSION:  PT End of Session - 10/01/22 1402     Visit Number 3    Number of Visits 4    Date for PT Re-Evaluation 10/06/22    Authorization Type [employer]    PT Start Time 0865    PT Stop Time 1418   full time not used due to D/C visit   PT Time Calculation (min) 17 min    Activity Tolerance Patient tolerated treatment well    Behavior During Therapy WFL for tasks assessed/performed              Past Medical History:  Diagnosis Date   Complication of anesthesia    VERY CLAUSTROPHIC W/ MASK   Depression    GERD (gastroesophageal reflux disease)    History of ureter repair    right ureteral reimplant for stricture 09-03-2014   Hx of transfusion of packed red blood cells    Hyperlipidemia    Hypertension    no meds s/p wt loss   OA (osteoarthritis)    Postop Transfusion 03/21/2013   Postoperative wound infection    Past Surgical History:  Procedure Laterality Date   BREAST REDUCTION SURGERY Bilateral 04-12-2008   CARDIOVASCULAR STRESS TEST  12-26-2009   Normal nuclear study/  no ischemia/  ef 65%   CYSTO/  RIGHT RETROGRADE PYELOGRAM/  BALLOON DILATION RIGHT URETER STRICTURE/  STENT PLACEMENT  05-25-2010//   03-14-2009//   01-28-2009   CYSTOSCOPY WITH STENT PLACEMENT Right 02/18/2014   Procedure: CYSTOSCOPY WITH right STENT PLACEMENT;  Surgeon: Arvil Persons, MD;  Location: [facility];  Service: Urology;  Laterality: Right;   EXPLORATORY LAPAROTOMY W/ RIGHT URETER REPAIR  2000   INCISION AND DRAINAGE ABSCESS N/A 11/28/2014   Procedure: INCISION AND DRAINAGE ABSCESS;  Surgeon: Ardis Hughs, MD;  Location: [facility];  Service: Urology;  Laterality: N/A;   INCISION AND DRAINAGE ABSCESS N/A 02/15/2015   Procedure: INCISION AND DRAINAGE OF WOUND;  Surgeon: Ardis Hughs, MD;  Location: [facility];  Service: Urology;  Laterality: N/A;   KNEE ARTHROSCOPY  left  2004//   bilateral 1980   KNEE CLOSED REDUCTION Bilateral 06/02/2013   Procedure: CLOSED MANIPULATION BILATERAL KNEES;  Surgeon: Gearlean Alf, MD;  Location: [facility];  Service: Orthopedics;  Laterality: Bilateral;   REDUCTION MAMMAPLASTY     TONSILLECTOMY AND ADENOIDECTOMY  as child   TOTAL KNEE ARTHROPLASTY Bilateral 03/17/2013   Procedure: TOTAL KNEE BILATERAL;  Surgeon: Gearlean Alf, MD;  Location: [facility];  Service: Orthopedics;  Laterality: Bilateral;   URETERAL REIMPLANTION Right 09/13/2014   Procedure: OPEN RIGHT URETERAL REIMPLANT;  Surgeon: Ardis Hughs, MD;  Location: [facility];  Service: Urology;  Laterality: Right;   VAGINAL HYSTERECTOMY  2000   w/ right salpingoophorectomy   Patient Active Problem List   Diagnosis Date Noted   Concentration deficit 06/26/2022   Facial laceration 06/26/2022   Concussion with no loss of consciousness 06/26/2022   Shingles 06/01/2022   COVID-19 01/17/2021   Concussion 01/17/2021   CTS (carpal tunnel syndrome) 06/28/2020   Prediabetes 04/29/2018   Hypertriglyceridemia 03/10/2018   CKD (chronic kidney disease) stage 3, GFR 30-59 ml/min ([place]) 07/22/2017   Essential hypertension, benign 01/29/2016   Anxiety 01/29/2016   Depression 01/29/2016   GERD (gastroesophageal reflux disease)  01/29/2016   Ureteral stricture, right 09/20/2014   S/P ureteral reimplantation 09/13/2014   S/P bilateral unicompartmental knee replacement 03/22/2013   OA (osteoarthritis) of knee 03/17/2013   PES PLANUS 08/28/2009    PCP: Pincus Sanes, MD REFERRING PROVIDER: Pincus Sanes, MD  REFERRING DIAG: R42 (ICD-10-CM) - Vertigo  THERAPY DIAG:  Dizziness and giddiness  ONSET DATE: 09/04/2022  Rationale for Evaluation and Treatment: Rehabilitation  SUBJECTIVE:   SUBJECTIVE STATEMENT: Pt has had no dizziness episodes since she was last  here. Feels like she is 99.99% back to herself. Wants to try to go back to the [facility].   Pt accompanied by: self  PERTINENT HISTORY: Jennifer Sanford is a 72 y.o. female, history of prediabetes, who presents to the ED on 06/22/22 after a fall that occurred after working out.  She states she was in the parking lot talking on the phone, when she tripped over her feet, and hit the right side of her face on the concrete.  She denies any loss of consciousness, but states she has been dizzy since that episode.  Imaging did not show any acute fractures, abnormalities. No tenderness to nose, mouth. X-ray of elbow was unremarkable  PAIN:  Are you having pain? No  PRECAUTIONS: Fall  WEIGHT BEARING RESTRICTIONS: No  FALLS: Has patient fallen in last 6 months? Yes. Number of falls 1   PLOF: Independent  PATIENT GOALS: Figure out how to get rid of the dizziness, manage the dizziness.   OBJECTIVE:    VESTIBULAR TREATMENT:                                                                                                   DATE: 10/01/22  POSITIONAL TESTING: Right Dix-Hallpike: no nystagmus Left Dix-Hallpike: no nystagmus Right Sidelying: no nystagmus Left Sidelying: no nystagmus  Pt with no dizziness in any position.   FOTO:  DPS: 70 DFS: 67.4   PATIENT EDUCATION: Education details: Discussed resolution of BPPV at this time and re-occurrence rate of BPPV, educated on self Epley maneuver for home and provided handout, discussed if BPPV returns then can try self Epley maneuver at home or can get a new referral to return to PT.  Person educated: Patient Education method: Explanation, Demonstration, Verbal cues, and Handouts Education comprehension: verbalized understanding, returned demonstration, and verbal cues required  HOME EXERCISE PROGRAM: Will provide next session as needed.   GOALS: Goals reviewed with patient? Yes  SHORT TERM GOALS: ALL STGS = LTGS   LONG TERM GOALS: Target  date: 10/04/2022   1. Pt will demonstrate negative positional testing in order to demo resolution of BPPV. Baseline: R posterior canal BPPV Goal status: MET  2.  Pt will verbalize/demonstrate understanding of self Epley maneuver for home.  Baseline:  Goal status: MET  3.  Pt will improve DPS to 62 in order to demo improved functional outcomes. Baseline: 48; 70 on 10/01/22 Goal status: MET  ASSESSMENT:  CLINICAL IMPRESSION: Pt returns to PT after traveling for the holidays and reports that she has had no episodes of dizziness since then.  Re-assessed canal testing with pt with no dizziness or nystagmus in any position, indicating resolution of BPPV. Pt has met all LTGs. Gave pt self-Epley maneuver for home in case BPPV returns. Pt would like to keep her chart open for the next couple of weeks while she goes back to the gym and swimming to see if dizziness returns. Then pt plans to call and give an update whether she wants her chart to be discharged. Pt overall very pleased and happy that she no longer has dizziness.   OBJECTIVE IMPAIRMENTS: dizziness.   ACTIVITY LIMITATIONS: bending and bed mobility  PARTICIPATION LIMITATIONS:  N/A  PERSONAL FACTORS: Past/current experiences and Time since onset of injury/illness/exacerbation are also affecting patient's functional outcome.   REHAB POTENTIAL: Excellent  CLINICAL DECISION MAKING: Stable/uncomplicated  EVALUATION COMPLEXITY: Low   PLAN:  PT FREQUENCY: 1x/week  PT DURATION: 4 weeks  PLANNED INTERVENTIONS: Therapeutic exercises, Therapeutic activity, Neuromuscular re-education, Balance training, Gait training, Patient/Family education, Self Care, Vestibular training, and Canalith repositioning  PLAN FOR NEXT SESSION: D/C    Drake Leach, PT, DPT  10/01/2022, 2:21 PM

## 2022-10-27 ENCOUNTER — Encounter: Payer: Self-pay | Admitting: Internal Medicine

## 2022-10-27 NOTE — Progress Notes (Unsigned)
Subjective:    Patient ID: Jennifer Sanford, female    DOB: 10-14-1950, 72 y.o.   MRN: [id number]      HPI Jennifer Sanford is here for a Physical exam.    Had two days of black stool - 2 stools Friday and 2 stools Saturday.  No red blood.  Sunday was back to normal. No obvious cause-did not take any medication or eat any food that would have caused black stools.  No abdominal, pain, nausea.   Medications and allergies reviewed with patient and updated if appropriate.  Current Outpatient Medications on File Prior to Visit  Medication Sig Dispense Refill   Apoaequorin (PREVAGEN) 10 MG CAPS Take by mouth.     B Complex Vitamins (B COMPLEX 50) TABS daily  0   Biotin 1000 MCG tablet      Calcium Carb-Cholecalciferol (CALCIUM 600 + D PO)      Cholecalciferol (VITAMIN D3) 1000 units CAPS daily 60 capsule    citalopram (CELEXA) 20 MG tablet TAKE 1 TABLET (20 MG TOTAL) BY MOUTH EVERY MORNING. 90 tablet 3   famotidine (PEPCID) 40 MG tablet TAKE 1 TABLET TWICE DAILY 180 tablet 3   fenofibrate 160 MG tablet TAKE 1 TABLET (160 MG TOTAL) BY MOUTH EVERY MORNING. 90 tablet 3   hydrochlorothiazide (HYDRODIURIL) 25 MG tablet Take 1 tablet (25 mg total) by mouth daily. 90 tablet 3   latanoprost (XALATAN) 0.005 % ophthalmic solution Place 1 drop into the left eye at bedtime.     meclizine (ANTIVERT) 12.5 MG tablet Take 1-2 tablets (12.5-25 mg total) by mouth 3 (three) times daily as needed for dizziness. 40 tablet 1   Omega-3 Fatty Acids (FISH OIL PO) Take by mouth.     pantoprazole (PROTONIX) 40 MG tablet TAKE 1 TABLET TWICE DAILY BEFORE MEALS 180 tablet 3   VAGINAL LUBRICANT VA Place vaginally. Patient currently using GYNATROF suppositories     Zinc 50 MG CAPS      No current facility-administered medications on file prior to visit.    Review of Systems  Constitutional:  Negative for fever.  HENT:  Negative for trouble swallowing.   Eyes:  Negative for visual disturbance.  Respiratory:  Negative  for cough, shortness of breath and wheezing.   Cardiovascular:  Negative for chest pain, palpitations and leg swelling.  Gastrointestinal:  Negative for abdominal pain, blood in stool (2 days of black stool), constipation, diarrhea and nausea.       No gerd  Genitourinary:  Negative for dysuria.  Musculoskeletal:  Negative for arthralgias and back pain.  Skin:  Negative for rash.  Neurological:  Negative for light-headedness and headaches.  Psychiatric/Behavioral:  Positive for dysphoric mood (controlled). The patient is nervous/anxious (controlled).        Objective:   Vitals:   10/28/22 0855  BP: 110/70  Pulse: 70  Temp: 98.1 F (36.7 C)  SpO2: 99%   Filed Weights   10/28/22 0855  Weight: 215 lb (97.5 kg)   Body mass index is 29.99 kg/m.  BP Readings from Last 3 Encounters:  10/28/22 110/70  06/26/22 116/68  06/22/22 132/75    Wt Readings from Last 3 Encounters:  10/28/22 215 lb (97.5 kg)  06/26/22 217 lb (98.4 kg)  04/24/22 216 lb (98 kg)       Physical Exam Constitutional: She appears well-developed and well-nourished. No distress.  HENT:  Head: Normocephalic and atraumatic.  Right Ear: External ear normal. Normal ear canal and TM  Left Ear: External ear normal.  Normal ear canal and TM Mouth/Throat: Oropharynx is clear and moist.  Eyes: Conjunctivae normal.  Neck: Neck supple. No tracheal deviation present. No thyromegaly present.  No carotid bruit  Cardiovascular: Normal rate, regular rhythm and normal heart sounds.   No murmur heard.  No edema. Pulmonary/Chest: Effort normal and breath sounds normal. No respiratory distress. She has no wheezes. She has no rales.  Breast: deferred   Abdominal: Soft. She exhibits no distension. There is no tenderness.  Lymphadenopathy: She has no cervical adenopathy.  Skin: Skin is warm and dry. She is not diaphoretic.  Psychiatric: She has a normal mood and affect. Her behavior is normal.     Lab Results   Component Value Date   WBC 8.4 06/22/2022   HGB 12.6 06/22/2022   HCT 38.3 06/22/2022   PLT 289 06/22/2022   GLUCOSE 89 06/22/2022   CHOL 326 (H) 04/24/2022   TRIG 151.0 (H) 04/24/2022   HDL 61.90 04/24/2022   LDLCALC 234 (H) 04/24/2022   ALT 16 06/22/2022   AST 24 06/22/2022   NA 141 06/22/2022   K 3.8 06/22/2022   CL 101 06/22/2022   CREATININE 1.19 (H) 06/22/2022   BUN 26 (H) 06/22/2022   CO2 30 06/22/2022   TSH 0.79 06/28/2020   INR 2.44 (H) 04/02/2013   HGBA1C 6.1 04/24/2022         Assessment & Plan:   Physical exam: Screening blood work  ordered Exercise  [facility] - [facility]  working on weight loss Substance abuse  none   Reviewed recommended immunizations.   Health Maintenance  Topic Date Due   Medicare Annual Wellness (AWV)  Never done   COVID-19 Vaccine (3 - 2023-24 season) 05/24/2022   INFLUENZA VACCINE  12/22/2022 (Originally 04/23/2022)   DEXA SCAN  04/20/2024   MAMMOGRAM  06/17/2024   COLONOSCOPY (Pts 45-34yrs Insurance coverage will need to be confirmed)  01/05/2025   DTaP/Tdap/Td (3 - Td or Tdap) 06/22/2032   Pneumonia Vaccine 21+ Years old  Completed   Hepatitis C Screening  Completed   Zoster Vaccines- Shingrix  Completed   HPV VACCINES  Aged Out          See Problem List for Assessment and Plan of chronic medical problems.

## 2022-10-27 NOTE — Patient Instructions (Addendum)
Blood work was ordered.   The lab is on the first floor.    Medications changes include :   none     Return in about 6 months (around 04/28/2023) for follow up.    Health Maintenance, Female Adopting a healthy lifestyle and getting preventive care are important in promoting health and wellness. Ask your health care provider about: The right schedule for you to have regular tests and exams. Things you can do on your own to prevent diseases and keep yourself healthy. What should I know about diet, weight, and exercise? Eat a healthy diet  Eat a diet that includes plenty of vegetables, fruits, low-fat dairy products, and lean protein. Do not eat a lot of foods that are high in solid fats, added sugars, or sodium. Maintain a healthy weight Body mass index (BMI) is used to identify weight problems. It estimates body fat based on height and weight. Your health care provider can help determine your BMI and help you achieve or maintain a healthy weight. Get regular exercise Get regular exercise. This is one of the most important things you can do for your health. Most adults should: Exercise for at least 150 minutes each week. The exercise should increase your heart rate and make you sweat (moderate-intensity exercise). Do strengthening exercises at least twice a week. This is in addition to the moderate-intensity exercise. Spend less time sitting. Even light physical activity can be beneficial. Watch cholesterol and blood lipids Have your blood tested for lipids and cholesterol at 70 years of age, then have this test every 5 years. Have your cholesterol levels checked more often if: Your lipid or cholesterol levels are high. You are older than 70 years of age. You are at high risk for heart disease. What should I know about cancer screening? Depending on your health history and family history, you may need to have cancer screening at various ages. This may include screening  for: Breast cancer. Cervical cancer. Colorectal cancer. Skin cancer. Lung cancer. What should I know about heart disease, diabetes, and high blood pressure? Blood pressure and heart disease High blood pressure causes heart disease and increases the risk of stroke. This is more likely to develop in people who have high blood pressure readings or are overweight. Have your blood pressure checked: Every 3-5 years if you are 19-68 years of age. Every year if you are 10 years old or older. Diabetes Have regular diabetes screenings. This checks your fasting blood sugar level. Have the screening done: Once every three years after age 3 if you are at a normal weight and have a low risk for diabetes. More often and at a younger age if you are overweight or have a high risk for diabetes. What should I know about preventing infection? Hepatitis B If you have a higher risk for hepatitis B, you should be screened for this virus. Talk with your health care provider to find out if you are at risk for hepatitis B infection. Hepatitis C Testing is recommended for: Everyone born from [birth year] through 1963. Anyone with known risk factors for hepatitis C. Sexually transmitted infections (STIs) Get screened for STIs, including gonorrhea and chlamydia, if: You are sexually active and are younger than 70 years of age. You are older than 70 years of age and your health care provider tells you that you are at risk for this type of infection. Your sexual activity has changed since you were last screened, and you are  at increased risk for chlamydia or gonorrhea. Ask your health care provider if you are at risk. Ask your health care provider about whether you are at high risk for HIV. Your health care provider may recommend a prescription medicine to help prevent HIV infection. If you choose to take medicine to prevent HIV, you should first get tested for HIV. You should then be tested every 3 months for as long as you  are taking the medicine. Pregnancy If you are about to stop having your period (premenopausal) and you may become pregnant, seek counseling before you get pregnant. Take 400 to 800 micrograms (mcg) of folic acid every day if you become pregnant. Ask for birth control (contraception) if you want to prevent pregnancy. Osteoporosis and menopause Osteoporosis is a disease in which the bones lose minerals and strength with aging. This can result in bone fractures. If you are 62 years old or older, or if you are at risk for osteoporosis and fractures, ask your health care provider if you should: Be screened for bone loss. Take a calcium or vitamin D supplement to lower your risk of fractures. Be given hormone replacement therapy (HRT) to treat symptoms of menopause. Follow these instructions at home: Alcohol use Do not drink alcohol if: Your health care provider tells you not to drink. You are pregnant, may be pregnant, or are planning to become pregnant. If you drink alcohol: Limit how much you have to: 0-1 drink a day. Know how much alcohol is in your drink. In the [place] one drink equals one 12 oz bottle of beer (355 mL), one 5 oz glass of wine (148 mL), or one 1 oz glass of hard liquor (44 mL). Lifestyle Do not use any products that contain nicotine or tobacco. These products include cigarettes, chewing tobacco, and vaping devices, such as e-cigarettes. If you need help quitting, ask your health care provider. Do not use street drugs. Do not share needles. Ask your health care provider for help if you need support or information about quitting drugs. General instructions Schedule regular health, dental, and eye exams. Stay current with your vaccines. Tell your health care provider if: You often feel depressed. You have ever been abused or do not feel safe at home. Summary Adopting a healthy lifestyle and getting preventive care are important in promoting health and wellness. Follow your  health care provider's instructions about healthy diet, exercising, and getting tested or screened for diseases. Follow your health care provider's instructions on monitoring your cholesterol and blood pressure. This information is not intended to replace advice given to you by your health care provider. Make sure you discuss any questions you have with your health care provider. Document Revised: 01/29/2021 Document Reviewed: 01/29/2021 Elsevier Patient Education  2023 [employer].

## 2022-10-28 ENCOUNTER — Encounter: Payer: Medicare HMO | Admitting: Internal Medicine

## 2022-10-28 ENCOUNTER — Ambulatory Visit (INDEPENDENT_AMBULATORY_CARE_PROVIDER_SITE_OTHER): Payer: Medicare HMO | Admitting: Internal Medicine

## 2022-10-28 VITALS — BP 110/70 | HR 70 | Temp 98.1°F | Ht 71.0 in | Wt 215.0 lb

## 2022-10-28 DIAGNOSIS — Z0000 Encounter for general adult medical examination without abnormal findings: Secondary | ICD-10-CM | POA: Diagnosis not present

## 2022-10-28 DIAGNOSIS — K219 Gastro-esophageal reflux disease without esophagitis: Secondary | ICD-10-CM

## 2022-10-28 DIAGNOSIS — F419 Anxiety disorder, unspecified: Secondary | ICD-10-CM | POA: Diagnosis not present

## 2022-10-28 DIAGNOSIS — R7303 Prediabetes: Secondary | ICD-10-CM | POA: Diagnosis not present

## 2022-10-28 DIAGNOSIS — K921 Melena: Secondary | ICD-10-CM | POA: Diagnosis not present

## 2022-10-28 DIAGNOSIS — E781 Pure hyperglyceridemia: Secondary | ICD-10-CM | POA: Diagnosis not present

## 2022-10-28 DIAGNOSIS — I10 Essential (primary) hypertension: Secondary | ICD-10-CM | POA: Diagnosis not present

## 2022-10-28 DIAGNOSIS — N1831 Chronic kidney disease, stage 3a: Secondary | ICD-10-CM | POA: Diagnosis not present

## 2022-10-28 LAB — CBC WITH DIFFERENTIAL/PLATELET
Basophils Absolute: 0 10*3/uL (ref 0.0–0.1)
Basophils Relative: 0.4 % (ref 0.0–3.0)
Eosinophils Absolute: 0.1 10*3/uL (ref 0.0–0.7)
Eosinophils Relative: 1.7 % (ref 0.0–5.0)
HCT: 37.3 % (ref 36.0–46.0)
Hemoglobin: 12.5 g/dL (ref 12.0–15.0)
Lymphocytes Relative: 48 % — ABNORMAL HIGH (ref 12.0–46.0)
Lymphs Abs: 3 10*3/uL (ref 0.7–4.0)
MCHC: 33.7 g/dL (ref 30.0–36.0)
MCV: 88 fl (ref 78.0–100.0)
Monocytes Absolute: 0.6 10*3/uL (ref 0.1–1.0)
Monocytes Relative: 8.8 % (ref 3.0–12.0)
Neutro Abs: 2.6 10*3/uL (ref 1.4–7.7)
Neutrophils Relative %: 41.1 % — ABNORMAL LOW (ref 43.0–77.0)
Platelets: 286 10*3/uL (ref 150.0–400.0)
RBC: 4.23 Mil/uL (ref 3.87–5.11)
RDW: 14.7 % (ref 11.5–15.5)
WBC: 6.2 10*3/uL (ref 4.0–10.5)

## 2022-10-28 LAB — LIPID PANEL
Cholesterol: 205 mg/dL — ABNORMAL HIGH (ref 0–200)
HDL: 61.1 mg/dL (ref >39.00)
LDL Cholesterol: 132 mg/dL — ABNORMAL HIGH (ref 0–99)
NonHDL: 143.93
Total CHOL/HDL Ratio: 3
Triglycerides: 62 mg/dL (ref 0.0–149.0)
VLDL: 12.4 mg/dL (ref 0.0–40.0)

## 2022-10-28 LAB — COMPREHENSIVE METABOLIC PANEL
ALT: 15 U/L (ref 0–35)
AST: 22 U/L (ref 0–37)
Albumin: 4.1 g/dL (ref 3.5–5.2)
Alkaline Phosphatase: 57 U/L (ref 39–117)
BUN: 27 mg/dL — ABNORMAL HIGH (ref 6–23)
CO2: 31 mEq/L (ref 19–32)
Calcium: 9.7 mg/dL (ref 8.4–10.5)
Chloride: 103 mEq/L (ref 96–112)
Creatinine, Ser: 1.16 mg/dL (ref 0.40–1.20)
GFR: 48.16 mL/min — ABNORMAL LOW (ref >60.00)
Glucose, Bld: 87 mg/dL (ref 70–99)
Potassium: 4.3 mEq/L (ref 3.5–5.1)
Sodium: 141 mEq/L (ref 135–145)
Total Bilirubin: 0.4 mg/dL (ref 0.2–1.2)
Total Protein: 7.3 g/dL (ref 6.0–8.3)

## 2022-10-28 LAB — TSH: TSH: 0.92 u[IU]/mL (ref 0.35–5.50)

## 2022-10-28 LAB — HEMOGLOBIN A1C: Hgb A1c MFr Bld: 6 % (ref 4.6–6.5)

## 2022-10-28 NOTE — Assessment & Plan Note (Signed)
Chronic Continue increased water intake, avoiding NSAIDs Blood pressure well-controlled Has been trying to decrease PPI CBC, CMP

## 2022-10-28 NOTE — Assessment & Plan Note (Signed)
Chronic Controlled, Stable Continue citalopram 20 mg daily 

## 2022-10-28 NOTE — Assessment & Plan Note (Signed)
Chronic Regular exercise and healthy diet encouraged Check lipid panel  Continue fenofibrate 160 mg daily

## 2022-10-28 NOTE — Assessment & Plan Note (Signed)
Chronic Check a1c Low sugar / carb diet Stressed regular exercise  

## 2022-10-28 NOTE — Assessment & Plan Note (Signed)
Chronic Blood pressure well controlled CMP Continue hydrochlorothiazide 25 mg daily 

## 2022-10-28 NOTE — Assessment & Plan Note (Addendum)
Chronic GERD controlled Continue Pepcid 40 mg once daily, pantoprazole 40 mg twice daily Will increase pepcid to twice a day and try to decrease the pantoprazole to once a day

## 2022-10-29 ENCOUNTER — Encounter: Payer: Self-pay | Admitting: Physical Therapy

## 2022-10-30 ENCOUNTER — Encounter: Payer: Self-pay | Admitting: Internal Medicine

## 2022-10-31 DIAGNOSIS — R1013 Epigastric pain: Secondary | ICD-10-CM | POA: Diagnosis not present

## 2022-10-31 DIAGNOSIS — E669 Obesity, unspecified: Secondary | ICD-10-CM | POA: Diagnosis not present

## 2022-10-31 DIAGNOSIS — I10 Essential (primary) hypertension: Secondary | ICD-10-CM | POA: Diagnosis not present

## 2022-10-31 DIAGNOSIS — K219 Gastro-esophageal reflux disease without esophagitis: Secondary | ICD-10-CM | POA: Diagnosis not present

## 2022-10-31 DIAGNOSIS — K5730 Diverticulosis of large intestine without perforation or abscess without bleeding: Secondary | ICD-10-CM | POA: Diagnosis not present

## 2022-10-31 DIAGNOSIS — Z1211 Encounter for screening for malignant neoplasm of colon: Secondary | ICD-10-CM | POA: Diagnosis not present

## 2022-10-31 DIAGNOSIS — K625 Hemorrhage of anus and rectum: Secondary | ICD-10-CM | POA: Diagnosis not present

## 2022-11-01 DIAGNOSIS — K5731 Diverticulosis of large intestine without perforation or abscess with bleeding: Secondary | ICD-10-CM | POA: Diagnosis not present

## 2022-11-01 DIAGNOSIS — Z1211 Encounter for screening for malignant neoplasm of colon: Secondary | ICD-10-CM | POA: Diagnosis not present

## 2022-11-01 DIAGNOSIS — K648 Other hemorrhoids: Secondary | ICD-10-CM | POA: Diagnosis not present

## 2022-11-01 DIAGNOSIS — K921 Melena: Secondary | ICD-10-CM | POA: Diagnosis not present

## 2022-11-01 DIAGNOSIS — R1013 Epigastric pain: Secondary | ICD-10-CM | POA: Diagnosis not present

## 2022-11-01 DIAGNOSIS — K562 Volvulus: Secondary | ICD-10-CM | POA: Diagnosis not present

## 2022-11-01 DIAGNOSIS — K5730 Diverticulosis of large intestine without perforation or abscess without bleeding: Secondary | ICD-10-CM | POA: Diagnosis not present

## 2022-11-01 LAB — HM COLONOSCOPY

## 2022-11-05 ENCOUNTER — Other Ambulatory Visit: Payer: Self-pay

## 2022-11-05 ENCOUNTER — Other Ambulatory Visit: Payer: Self-pay | Admitting: Internal Medicine

## 2022-11-14 DIAGNOSIS — I10 Essential (primary) hypertension: Secondary | ICD-10-CM | POA: Diagnosis not present

## 2022-11-14 DIAGNOSIS — R1013 Epigastric pain: Secondary | ICD-10-CM | POA: Diagnosis not present

## 2022-11-14 DIAGNOSIS — K5730 Diverticulosis of large intestine without perforation or abscess without bleeding: Secondary | ICD-10-CM | POA: Diagnosis not present

## 2022-11-14 DIAGNOSIS — E669 Obesity, unspecified: Secondary | ICD-10-CM | POA: Diagnosis not present

## 2022-11-14 DIAGNOSIS — K219 Gastro-esophageal reflux disease without esophagitis: Secondary | ICD-10-CM | POA: Diagnosis not present

## 2022-11-16 ENCOUNTER — Other Ambulatory Visit: Payer: Self-pay | Admitting: Internal Medicine

## 2022-12-03 DIAGNOSIS — M25512 Pain in left shoulder: Secondary | ICD-10-CM | POA: Diagnosis not present

## 2022-12-07 DIAGNOSIS — Z0100 Encounter for examination of eyes and vision without abnormal findings: Secondary | ICD-10-CM | POA: Diagnosis not present

## 2023-01-02 DIAGNOSIS — H43393 Other vitreous opacities, bilateral: Secondary | ICD-10-CM | POA: Diagnosis not present

## 2023-01-13 DIAGNOSIS — Z4789 Encounter for other orthopedic aftercare: Secondary | ICD-10-CM | POA: Diagnosis not present

## 2023-01-20 DIAGNOSIS — M25562 Pain in left knee: Secondary | ICD-10-CM | POA: Diagnosis not present

## 2023-01-20 DIAGNOSIS — M25561 Pain in right knee: Secondary | ICD-10-CM | POA: Diagnosis not present

## 2023-01-27 DIAGNOSIS — G5601 Carpal tunnel syndrome, right upper limb: Secondary | ICD-10-CM | POA: Diagnosis not present

## 2023-01-28 DIAGNOSIS — N1831 Chronic kidney disease, stage 3a: Secondary | ICD-10-CM | POA: Diagnosis not present

## 2023-01-28 DIAGNOSIS — I129 Hypertensive chronic kidney disease with stage 1 through stage 4 chronic kidney disease, or unspecified chronic kidney disease: Secondary | ICD-10-CM | POA: Diagnosis not present

## 2023-01-28 DIAGNOSIS — N390 Urinary tract infection, site not specified: Secondary | ICD-10-CM | POA: Diagnosis not present

## 2023-01-28 DIAGNOSIS — Z9889 Other specified postprocedural states: Secondary | ICD-10-CM | POA: Diagnosis not present

## 2023-01-28 DIAGNOSIS — R7303 Prediabetes: Secondary | ICD-10-CM | POA: Diagnosis not present

## 2023-01-28 DIAGNOSIS — K219 Gastro-esophageal reflux disease without esophagitis: Secondary | ICD-10-CM | POA: Diagnosis not present

## 2023-01-29 LAB — LAB REPORT - SCANNED
Albumin, Urine POC: 6.6
Creatinine, POC: 147.9 mg/dL
EGFR: 56
Microalb Creat Ratio: 4

## 2023-02-09 NOTE — Therapy (Unsigned)
OUTPATIENT PHYSICAL THERAPY LOWER EXTREMITY EVALUATION   Patient Name: Marshall Speiser MRN: [id number] DOB:1951-04-03, 72 y.o., female Today's Date: 02/09/2023  END OF SESSION:   Past Medical History:  Diagnosis Date   Complication of anesthesia    VERY CLAUSTROPHIC W/ MASK   Depression    GERD (gastroesophageal reflux disease)    History of ureter repair    right ureteral reimplant for stricture 09-03-2014   Hx of transfusion of packed red blood cells    Hyperlipidemia    Hypertension    no meds s/p wt loss   OA (osteoarthritis)    Postop Transfusion 03/21/2013   Postoperative wound infection    Past Surgical History:  Procedure Laterality Date   BREAST REDUCTION SURGERY Bilateral 04-12-2008   CARDIOVASCULAR STRESS TEST  12-26-2009   Normal nuclear study/  no ischemia/  ef 65%   CYSTO/  RIGHT RETROGRADE PYELOGRAM/  BALLOON DILATION RIGHT URETER STRICTURE/  STENT PLACEMENT  05-25-2010//   03-14-2009//   01-28-2009   CYSTOSCOPY WITH STENT PLACEMENT Right 02/18/2014   Procedure: CYSTOSCOPY WITH right STENT PLACEMENT;  Surgeon: Danae Chen, MD;  Location: [facility];  Service: Urology;  Laterality: Right;   EXPLORATORY LAPAROTOMY W/ RIGHT URETER REPAIR  2000   INCISION AND DRAINAGE ABSCESS N/A 11/28/2014   Procedure: INCISION AND DRAINAGE ABSCESS;  Surgeon: Crist Fat, MD;  Location: [facility];  Service: Urology;  Laterality: N/A;   INCISION AND DRAINAGE ABSCESS N/A 02/15/2015   Procedure: INCISION AND DRAINAGE OF WOUND;  Surgeon: Crist Fat, MD;  Location: [facility];  Service: Urology;  Laterality: N/A;   KNEE ARTHROSCOPY  left  2004//   bilateral 1980   KNEE CLOSED REDUCTION Bilateral 06/02/2013   Procedure: CLOSED MANIPULATION BILATERAL KNEES;  Surgeon: Loanne Drilling, MD;  Location: [facility];  Service: Orthopedics;  Laterality: Bilateral;   REDUCTION MAMMAPLASTY     TONSILLECTOMY AND ADENOIDECTOMY  as child    TOTAL KNEE ARTHROPLASTY Bilateral 03/17/2013   Procedure: TOTAL KNEE BILATERAL;  Surgeon: Loanne Drilling, MD;  Location: [facility];  Service: Orthopedics;  Laterality: Bilateral;   URETERAL REIMPLANTION Right 09/13/2014   Procedure: OPEN RIGHT URETERAL REIMPLANT;  Surgeon: Crist Fat, MD;  Location: [facility];  Service: Urology;  Laterality: Right;   VAGINAL HYSTERECTOMY  2000   w/ right salpingoophorectomy   Patient Active Problem List   Diagnosis Date Noted   Concentration deficit 06/26/2022   Concussion with no loss of consciousness 06/26/2022   Shingles 06/01/2022   COVID-19 01/17/2021   Concussion 01/17/2021   CTS (carpal tunnel syndrome) 06/28/2020   Prediabetes 04/29/2018   Hypertriglyceridemia 03/10/2018   CKD (chronic kidney disease) stage 3, GFR 30-59 ml/min (HCC) 07/22/2017   Essential hypertension, benign 01/29/2016   Anxiety 01/29/2016   Depression 01/29/2016   GERD (gastroesophageal reflux disease) 01/29/2016   Ureteral stricture, right 09/20/2014   S/P ureteral reimplantation 09/13/2014   S/P bilateral unicompartmental knee replacement 03/22/2013   OA (osteoarthritis) of knee 03/17/2013   PES PLANUS 08/28/2009    PCP: Cheryll Cockayne MD  REFERRING PROVIDER: Cory Munch, PA-C  REFERRING DIAG: [phone] (ICD-10-CM) - Pain in right knee  THERAPY DIAG:  No diagnosis found.  Rationale for Evaluation and Treatment: Rehabilitation  ONSET DATE: chronic   SUBJECTIVE:   SUBJECTIVE STATEMENT: ***  PERTINENT HISTORY: Plan: Discussed clinical and radiographic findings with the patient. There is no clinical or radiographic sign of infection or  prosthetic loosening. She does have weakness in flexion and extension as well as valgus instability with increased AP play concerning for flexion instability. We agreed to proceed with referral to physical therapy for quad and hamstring strengthening. Transition to home exercise program at her gym when therapy feels  appropriate. Plan for follow-up in 6-8 weeks for reassessment, sooner if needed. Her questions were encouraged and addressed today. PAIN:  Are you having pain? Yes: NPRS scale: ***/10 Pain location: *** Pain description: *** Aggravating factors: *** Relieving factors: ***  PRECAUTIONS: {Therapy precautions:24002}  WEIGHT BEARING RESTRICTIONS: No  FALLS:  Has patient fallen in last 6 months? {fallsyesno:27318}  LIVING ENVIRONMENT: Lives with: {[facility] lives with:25569::"lives with their family"} Lives in: {Lives in:25570} Stairs: {opstairs:27293} Has following equipment at home: {Assistive devices:23999}  OCCUPATION: ***  PLOF: Independent  PATIENT GOALS: ***  NEXT MD VISIT: ***  OBJECTIVE:   DIAGNOSTIC FINDINGS: ***  PATIENT SURVEYS:  {rehab surveys:24030}  COGNITION: Overall cognitive status: {cognition:24006}     SENSATION: {sensation:27233}  EDEMA:  {edema:24020}  MUSCLE LENGTH: Hamstrings: Right *** deg; Left *** deg Thomas test: Right *** deg; Left *** deg  POSTURE: {posture:25561}  PALPATION: ***  LOWER EXTREMITY ROM:  {AROM/PROM:27142} ROM Right eval Left eval  Hip flexion    Hip extension    Hip abduction    Hip adduction    Hip internal rotation    Hip external rotation    Knee flexion    Knee extension    Ankle dorsiflexion    Ankle plantarflexion    Ankle inversion    Ankle eversion     (Blank rows = not tested)  LOWER EXTREMITY MMT:  MMT Right eval Left eval  Hip flexion    Hip extension    Hip abduction    Hip adduction    Hip internal rotation    Hip external rotation    Knee flexion    Knee extension    Ankle dorsiflexion    Ankle plantarflexion    Ankle inversion    Ankle eversion     (Blank rows = not tested)  LOWER EXTREMITY SPECIAL TESTS:  {LEspecialtests:26242}  FUNCTIONAL TESTS:  {Functional tests:24029}  GAIT: Distance walked: *** Assistive device utilized: {Assistive devices:23999} Level of  assistance: {Levels of assistance:24026} Comments: ***   TODAY'S TREATMENT:                                                                                                                              DATE: ***    PATIENT EDUCATION:  Education details: *** Person educated: {Person educated:25204} Education method: {Education Method:25205} Education comprehension: {Education Comprehension:25206}  HOME EXERCISE PROGRAM: ***  ASSESSMENT:  CLINICAL IMPRESSION: Patient is a *** y.o. *** who was seen today for physical therapy evaluation and treatment for ***.   OBJECTIVE IMPAIRMENTS: {opptimpairments:25111}.   ACTIVITY LIMITATIONS: {activitylimitations:27494}  PARTICIPATION LIMITATIONS: {participationrestrictions:25113}  PERSONAL FACTORS: {Personal factors:25162} are also affecting patient's functional outcome.  REHAB POTENTIAL: {rehabpotential:25112}  CLINICAL DECISION MAKING: {clinical decision making:25114}  EVALUATION COMPLEXITY: {Evaluation complexity:25115}   GOALS: Goals reviewed with patient? {yes/no:20286}  SHORT TERM GOALS: Target date: *** *** Baseline: Goal status: {GOALSTATUS:25110}  2.  *** Baseline:  Goal status: {GOALSTATUS:25110}  3.  *** Baseline:  Goal status: {GOALSTATUS:25110}  4.  *** Baseline:  Goal status: {GOALSTATUS:25110}  5.  *** Baseline:  Goal status: {GOALSTATUS:25110}  6.  *** Baseline:  Goal status: {GOALSTATUS:25110}  LONG TERM GOALS: Target date: ***  *** Baseline:  Goal status: {GOALSTATUS:25110}  2.  *** Baseline:  Goal status: {GOALSTATUS:25110}  3.  *** Baseline:  Goal status: {GOALSTATUS:25110}  4.  *** Baseline:  Goal status: {GOALSTATUS:25110}  5.  *** Baseline:  Goal status: {GOALSTATUS:25110}  6.  *** Baseline:  Goal status: {GOALSTATUS:25110}   PLAN:  PT FREQUENCY: {rehab frequency:25116}  PT DURATION: {rehab duration:25117}  PLANNED INTERVENTIONS: {rehab planned  interventions:25118::"Therapeutic exercises","Therapeutic activity","Neuromuscular re-education","Balance training","Gait training","Patient/Family education","Self Care","Joint mobilization"}  PLAN FOR NEXT SESSION: ***   Kumiko Fishman, PT 02/09/2023, 7:49 PM

## 2023-02-10 ENCOUNTER — Ambulatory Visit: Payer: Medicare HMO | Attending: Physician Assistant | Admitting: Physical Therapy

## 2023-02-10 DIAGNOSIS — G8929 Other chronic pain: Secondary | ICD-10-CM

## 2023-02-10 DIAGNOSIS — M25561 Pain in right knee: Secondary | ICD-10-CM | POA: Insufficient documentation

## 2023-02-10 NOTE — Therapy (Signed)
Patient arrived and was not seen for an eval as she is no longer having knee issues.  She thought PT was for her shoulder but her injection helped that.  She has been able to go back to the gym and is feeling much better.  She declined further evaluation.

## 2023-03-10 DIAGNOSIS — M65831 Other synovitis and tenosynovitis, right forearm: Secondary | ICD-10-CM | POA: Diagnosis not present

## 2023-03-10 DIAGNOSIS — G5601 Carpal tunnel syndrome, right upper limb: Secondary | ICD-10-CM | POA: Diagnosis not present

## 2023-03-24 ENCOUNTER — Encounter: Payer: Self-pay | Admitting: Internal Medicine

## 2023-03-24 DIAGNOSIS — Z01419 Encounter for gynecological examination (general) (routine) without abnormal findings: Secondary | ICD-10-CM | POA: Diagnosis not present

## 2023-03-24 DIAGNOSIS — E669 Obesity, unspecified: Secondary | ICD-10-CM | POA: Diagnosis not present

## 2023-03-24 DIAGNOSIS — Z90710 Acquired absence of both cervix and uterus: Secondary | ICD-10-CM | POA: Diagnosis not present

## 2023-04-07 DIAGNOSIS — M25512 Pain in left shoulder: Secondary | ICD-10-CM | POA: Diagnosis not present

## 2023-04-18 ENCOUNTER — Encounter: Payer: Self-pay | Admitting: Internal Medicine

## 2023-04-18 DIAGNOSIS — H43393 Other vitreous opacities, bilateral: Secondary | ICD-10-CM | POA: Diagnosis not present

## 2023-04-20 ENCOUNTER — Other Ambulatory Visit: Payer: Self-pay

## 2023-04-20 ENCOUNTER — Encounter: Payer: Self-pay | Admitting: Emergency Medicine

## 2023-04-20 ENCOUNTER — Ambulatory Visit
Admission: EM | Admit: 2023-04-20 | Discharge: 2023-04-20 | Disposition: A | Discharge status: Home / Self Care | Payer: Medicare HMO | Attending: Physician Assistant | Admitting: Physician Assistant

## 2023-04-20 DIAGNOSIS — L309 Dermatitis, unspecified: Secondary | ICD-10-CM | POA: Diagnosis not present

## 2023-04-20 MED ORDER — PREDNISONE 20 MG PO TABS: 40.0000 mg | ORAL_TABLET | Freq: Every day | ORAL | 0 refills | Status: AC

## 2023-04-20 MED ORDER — VALACYCLOVIR HCL 1 G PO TABS: 1000.0000 mg | ORAL_TABLET | Freq: Three times a day (TID) | ORAL | 0 refills | Status: DC

## 2023-04-20 NOTE — ED Triage Notes (Signed)
Patient noticed an itchy area to left wrist last night.  This has continued today.  Slight redness to the area.  Patient has a history of a similar area on right wrist that worsened to a point of being scabbed wounds and some open wounds

## 2023-04-20 NOTE — ED Provider Notes (Signed)
[facility]    CSN: [id number] Arrival date & time: 04/20/23  1432      History   Chief Complaint Chief Complaint  Patient presents with   Rash    HPI Jennifer Sanford is a 72 y.o. female.   Patient here today for evaluation of itching to her left wrist that started last night.  She states she had similar presentation when she developed shingles on her right wrist.  She is concerned for same and she has a trip planned in the near future.  She notes itching elsewhere but no other rash.  She denies any fever.  She has not any shortness of breath or trouble swallowing.  She is unsure of any exposures that might of caused symptoms.  She has tried over-the-counter treatment without significant improvement.  The history is provided by the patient.  Rash Associated symptoms: no fever, no nausea, no shortness of breath and not vomiting     Past Medical History:  Diagnosis Date   Complication of anesthesia    VERY CLAUSTROPHIC W/ MASK   Depression    GERD (gastroesophageal reflux disease)    History of ureter repair    right ureteral reimplant for stricture 09-03-2014   Hx of transfusion of packed red blood cells    Hyperlipidemia    Hypertension    no meds s/p wt loss   OA (osteoarthritis)    Postop Transfusion 03/21/2013   Postoperative wound infection     Patient Active Problem List   Diagnosis Date Noted   Concentration deficit 06/26/2022   Concussion with no loss of consciousness 06/26/2022   Shingles 06/01/2022   COVID-19 01/17/2021   Concussion 01/17/2021   CTS (carpal tunnel syndrome) 06/28/2020   Prediabetes 04/29/2018   Hypertriglyceridemia 03/10/2018   CKD (chronic kidney disease) stage 3, GFR 30-59 ml/min (HCC) 07/22/2017   Essential hypertension, benign 01/29/2016   Anxiety 01/29/2016   Depression 01/29/2016   GERD (gastroesophageal reflux disease) 01/29/2016   Ureteral stricture, right 09/20/2014   S/P ureteral reimplantation 09/13/2014    S/P bilateral unicompartmental knee replacement 03/22/2013   OA (osteoarthritis) of knee 03/17/2013   PES PLANUS 08/28/2009    Past Surgical History:  Procedure Laterality Date   BREAST REDUCTION SURGERY Bilateral 04-12-2008   CARDIOVASCULAR STRESS TEST  12-26-2009   Normal nuclear study/  no ischemia/  ef 65%   CYSTO/  RIGHT RETROGRADE PYELOGRAM/  BALLOON DILATION RIGHT URETER STRICTURE/  STENT PLACEMENT  05-25-2010//   03-14-2009//   01-28-2009   CYSTOSCOPY WITH STENT PLACEMENT Right 02/18/2014   Procedure: CYSTOSCOPY WITH right STENT PLACEMENT;  Surgeon: Danae Chen, MD;  Location: [facility];  Service: Urology;  Laterality: Right;   EXPLORATORY LAPAROTOMY W/ RIGHT URETER REPAIR  2000   INCISION AND DRAINAGE ABSCESS N/A 11/28/2014   Procedure: INCISION AND DRAINAGE ABSCESS;  Surgeon: Crist Fat, MD;  Location: [facility];  Service: Urology;  Laterality: N/A;   INCISION AND DRAINAGE ABSCESS N/A 02/15/2015   Procedure: INCISION AND DRAINAGE OF WOUND;  Surgeon: Crist Fat, MD;  Location: [facility];  Service: Urology;  Laterality: N/A;   KNEE ARTHROSCOPY  left  2004//   bilateral 1980   KNEE CLOSED REDUCTION Bilateral 06/02/2013   Procedure: CLOSED MANIPULATION BILATERAL KNEES;  Surgeon: Loanne Drilling, MD;  Location: [facility];  Service: Orthopedics;  Laterality: Bilateral;   REDUCTION MAMMAPLASTY     TONSILLECTOMY AND ADENOIDECTOMY  as child  TOTAL KNEE ARTHROPLASTY Bilateral 03/17/2013   Procedure: TOTAL KNEE BILATERAL;  Surgeon: Loanne Drilling, MD;  Location: [facility];  Service: Orthopedics;  Laterality: Bilateral;   URETERAL REIMPLANTION Right 09/13/2014   Procedure: OPEN RIGHT URETERAL REIMPLANT;  Surgeon: Crist Fat, MD;  Location: [facility];  Service: Urology;  Laterality: Right;   VAGINAL HYSTERECTOMY  2000   w/ right salpingoophorectomy    OB History   No obstetric history on file.      Home Medications     Prior to Admission medications   Medication Sig Start Date End Date Taking? Authorizing Provider  fenofibrate 160 MG tablet TAKE 1 TABLET EVERY MORNING 11/05/22   Burns, Bobette Mo, MD  predniSONE (DELTASONE) 20 MG tablet Take 2 tablets (40 mg total) by mouth daily with breakfast for 5 days. 04/20/23 04/25/23 Yes Tomi Bamberger, PA-C  valACYclovir (VALTREX) 1000 MG tablet Take 1 tablet (1,000 mg total) by mouth 3 (three) times daily. 04/20/23  Yes Tomi Bamberger, PA-C  Apoaequorin (PREVAGEN) 10 MG CAPS Take by mouth.    [provider]  B Complex Vitamins (B COMPLEX 50) TABS daily 01/29/16   Burns, Bobette Mo, MD  Biotin 1000 MCG tablet     [provider]  Calcium Carb-Cholecalciferol (CALCIUM 600 + D PO)     [provider]  Cholecalciferol (VITAMIN D3) 1000 units CAPS daily 01/29/16   Burns, Bobette Mo, MD  citalopram (CELEXA) 20 MG tablet TAKE 1 TABLET (20 MG TOTAL) BY MOUTH EVERY MORNING. 04/11/21   Pincus Sanes, MD  famotidine (PEPCID) 40 MG tablet TAKE 1 TABLET TWICE DAILY 11/18/22   Pincus Sanes, MD  hydrochlorothiazide (HYDRODIURIL) 25 MG tablet Take 1 tablet (25 mg total) by mouth daily. 07/19/22   Burns, Bobette Mo, MD  latanoprost (XALATAN) 0.005 % ophthalmic solution Place 1 drop into the left eye at bedtime. 09/22/22   [provider]  meclizine (ANTIVERT) 12.5 MG tablet Take 1-2 tablets (12.5-25 mg total) by mouth 3 (three) times daily as needed for dizziness. 09/04/22   Pincus Sanes, MD  Omega-3 Fatty Acids (FISH OIL PO) Take by mouth. Patient not taking: Reported on 04/20/2023    [provider]  pantoprazole (PROTONIX) 40 MG tablet TAKE 1 TABLET TWICE DAILY BEFORE MEALS 11/18/22   Burns, Bobette Mo, MD  VAGINAL LUBRICANT [facility] vaginally. Patient currently using GYNATROF suppositories    [provider]  Zinc 50 MG CAPS     [provider]    Family History Family History  Problem Relation Age of Onset   Diabetes Mother         Deceased   Heart attack Mother    Dementia Mother    Hypertension Father        Deceased   Lung cancer Father    Cancer Other    Heart Problems Brother        CABG   Bipolar disorder Sister    Healthy Daughter    Breast cancer Maternal Grandmother     Social History Social History   Tobacco Use   Smoking status: Never   Smokeless tobacco: Never  Vaping Use   Vaping status: Never Used  Substance Use Topics   Alcohol use: No    Alcohol/week: 0.0 standard drinks of alcohol   Drug use: No     Allergies   Advil [ibuprofen], Mixed feathers, Naproxen, and Shrimp [shellfish allergy]   Review of Systems Review of Systems  Constitutional:  Negative for chills and fever.  HENT:  Negative for trouble swallowing.   Eyes:  Negative for discharge and redness.  Respiratory:  Negative for shortness of breath.   Gastrointestinal:  Negative for nausea and vomiting.  Skin:  Positive for rash.     Physical Exam Triage Vital Signs ED Triage Vitals  Encounter Vitals Group     BP 04/20/23 1541 113/75     Systolic BP Percentile --      Diastolic BP Percentile --      Pulse Rate 04/20/23 1541 71     Resp 04/20/23 1541 18     Temp 04/20/23 1541 97.9 F (36.6 C)     Temp Source 04/20/23 1541 Oral     SpO2 04/20/23 1541 98 %     Weight --      Height --      Head Circumference --      Peak Flow --      Pain Score 04/20/23 1539 0     Pain Loc --      Pain Education --      Exclude from Growth Chart --    No data found.  Updated Vital Signs BP 113/75 (BP Location: Left Arm)   Pulse 71   Temp 97.9 F (36.6 C) (Oral)   Resp 18   SpO2 98%     Physical Exam Vitals and nursing note reviewed.  Constitutional:      General: She is not in acute distress.    Appearance: Normal appearance. She is not ill-appearing.  HENT:     Head: Normocephalic and atraumatic.  Eyes:     Conjunctiva/sclera: Conjunctivae normal.  Cardiovascular:     Rate and Rhythm: Normal rate.   Pulmonary:     Effort: Pulmonary effort is normal. No respiratory distress.  Skin:    Comments: Area of erythema noted to left wrist without skin lesions.  Patient observed scratching in office.  Neurological:     Mental Status: She is alert.  Psychiatric:        Mood and Affect: Mood normal.        Behavior: Behavior normal.        Thought Content: Thought content normal.      UC Treatments / Results  Labs (all labs ordered are listed, but only abnormal results are displayed) Labs Reviewed - No data to display  EKG   Radiology No results found.  Procedures Procedures (including critical care time)  Medications Ordered in UC Medications - No data to display  Initial Impression / Assessment and Plan / UC Course  I have reviewed the triage vital signs and the nursing notes.  Pertinent labs & imaging results that were available during my care of the patient were reviewed by me and considered in my medical decision making (see chart for details).    Discussed that we typically would not recommend antiviral medication without development of lesions concerning for shingles.  I did send this prescription to the pharmacy in the event that she does develop same.  Steroid burst prescribed to cover other possible skin/ allergic reaction given itching elsewhere.  Encouraged follow-up if no gradual improvement or with any further concerns.  Final Clinical Impressions(s) / UC Diagnoses   Final diagnoses:  Dermatitis   Discharge Instructions   None    ED Prescriptions     Medication Sig Dispense Auth. Provider   predniSONE (DELTASONE) 20 MG tablet Take 2 tablets (40 mg total) by  mouth daily with breakfast for 5 days. 10 tablet Erma Pinto F, PA-C   valACYclovir (VALTREX) 1000 MG tablet Take 1 tablet (1,000 mg total) by mouth 3 (three) times daily. 21 tablet Tomi Bamberger, PA-C      PDMP not reviewed this encounter.   Tomi Bamberger, PA-C 04/20/23 [phone]

## 2023-04-27 ENCOUNTER — Encounter: Payer: Self-pay | Admitting: Internal Medicine

## 2023-04-27 NOTE — Patient Instructions (Addendum)
      Blood work was ordered.   The lab is on the first floor.    Medications changes include :   decrease hydrochlorothiazide 12.5 mg and start losartan 25 mg      Return in about 6 months (around 10/29/2023) for Physical Exam.

## 2023-04-27 NOTE — Progress Notes (Unsigned)
Subjective:    Patient ID: Jennifer Sanford, female    DOB: 07-09-1951, 72 y.o.   MRN: [id number]     HPI Jennifer Sanford is here for follow up of her chronic medical problems.  Leg cramps - occur at night and when exercising (water aerobics) - it can start in one leg and go to the other leg.   Drinking a lot of water.    Was drinking lots of sweet tea from [employer] - has stopped   Also doing wellness visit today  Exercise - y working out, [place]  Medications and allergies reviewed with patient and updated if appropriate.  Current Outpatient Medications on File Prior to Visit  Medication Sig Dispense Refill   Apoaequorin (PREVAGEN) 10 MG CAPS Take by mouth.     B Complex Vitamins (B COMPLEX 50) TABS daily  0   Biotin 1000 MCG tablet      Calcium Carb-Cholecalciferol (CALCIUM 600 + D PO)      Cholecalciferol (VITAMIN D3) 1000 units CAPS daily 60 capsule    citalopram (CELEXA) 20 MG tablet TAKE 1 TABLET (20 MG TOTAL) BY MOUTH EVERY MORNING. 90 tablet 3   famotidine (PEPCID) 40 MG tablet TAKE 1 TABLET TWICE DAILY 180 tablet 3   fenofibrate 160 MG tablet TAKE 1 TABLET EVERY MORNING 90 tablet 3   hydrochlorothiazide (HYDRODIURIL) 25 MG tablet Take 1 tablet (25 mg total) by mouth daily. 90 tablet 3   latanoprost (XALATAN) 0.005 % ophthalmic solution Place 1 drop into the left eye at bedtime.     pantoprazole (PROTONIX) 40 MG tablet TAKE 1 TABLET TWICE DAILY BEFORE MEALS 180 tablet 3   VAGINAL LUBRICANT [facility] vaginally. Patient currently using GYNATROF suppositories     valACYclovir (VALTREX) 1000 MG tablet Take 1 tablet (1,000 mg total) by mouth 3 (three) times daily. (Patient not taking: Reported on 04/28/2023) 21 tablet 0   Zinc 50 MG CAPS      No current facility-administered medications on file prior to visit.     Review of Systems  Constitutional:  Negative for fever.  Eyes:  Negative for visual disturbance.  Respiratory:  Negative for cough, shortness of  breath and wheezing.   Cardiovascular:  Positive for leg swelling (controlled hctz). Negative for chest pain and palpitations.  Gastrointestinal:  Negative for abdominal pain, blood in stool, constipation and diarrhea.       No gerd - controlled  Genitourinary:  Negative for dysuria.  Musculoskeletal:  Negative for arthralgias and back pain.  Skin:  Negative for rash.  Neurological:  Positive for light-headedness (occ - with low BP or dehydration). Negative for headaches.  Psychiatric/Behavioral:  Negative for dysphoric mood. The patient is not nervous/anxious.        Objective:   Vitals:   04/28/23 0945  BP: 112/72  Pulse: 88  Temp: 98 F (36.7 C)  SpO2: 98%   BP Readings from Last 3 Encounters:  04/28/23 112/72  04/20/23 113/75  10/28/22 110/70   Wt Readings from Last 3 Encounters:  04/28/23 207 lb (93.9 kg)  04/28/23 207 lb 3.2 oz (94 kg)  10/28/22 215 lb (97.5 kg)   Body mass index is 29.7 kg/m.    Physical Exam Constitutional:      General: She is not in acute distress.    Appearance: Normal appearance.  HENT:     Head: Normocephalic and atraumatic.  Eyes:     Conjunctiva/sclera: Conjunctivae normal.  Cardiovascular:  Rate and Rhythm: Normal rate and regular rhythm.     Heart sounds: Normal heart sounds.  Pulmonary:     Effort: Pulmonary effort is normal. No respiratory distress.     Breath sounds: Normal breath sounds. No wheezing.  Musculoskeletal:     Cervical back: Neck supple.     Right lower leg: No edema.     Left lower leg: No edema.  Lymphadenopathy:     Cervical: No cervical adenopathy.  Skin:    General: Skin is warm and dry.     Findings: No rash.  Neurological:     Mental Status: She is alert. Mental status is at baseline.  Psychiatric:        Mood and Affect: Mood normal.        Behavior: Behavior normal.        Lab Results  Component Value Date   WBC 6.2 10/28/2022   HGB 12.5 10/28/2022   HCT 37.3 10/28/2022   PLT 286.0  10/28/2022   GLUCOSE 87 10/28/2022   CHOL 205 (H) 10/28/2022   TRIG 62.0 10/28/2022   HDL 61.10 10/28/2022   LDLCALC 132 (H) 10/28/2022   ALT 15 10/28/2022   AST 22 10/28/2022   NA 141 10/28/2022   K 4.3 10/28/2022   CL 103 10/28/2022   CREATININE 1.16 10/28/2022   BUN 27 (H) 10/28/2022   CO2 31 10/28/2022   TSH 0.92 10/28/2022   INR 2.44 (H) 04/02/2013   HGBA1C 6.0 10/28/2022     Assessment & Plan:    See Problem List for Assessment and Plan of chronic medical problems.

## 2023-04-28 ENCOUNTER — Ambulatory Visit (INDEPENDENT_AMBULATORY_CARE_PROVIDER_SITE_OTHER): Payer: Medicare HMO | Admitting: Internal Medicine

## 2023-04-28 ENCOUNTER — Ambulatory Visit (INDEPENDENT_AMBULATORY_CARE_PROVIDER_SITE_OTHER): Payer: Medicare HMO

## 2023-04-28 VITALS — BP 112/72 | HR 88 | Temp 98.0°F | Ht 70.0 in | Wt 207.0 lb

## 2023-04-28 VITALS — Ht 70.0 in | Wt 207.2 lb

## 2023-04-28 DIAGNOSIS — Z0000 Encounter for general adult medical examination without abnormal findings: Secondary | ICD-10-CM | POA: Diagnosis not present

## 2023-04-28 DIAGNOSIS — E781 Pure hyperglyceridemia: Secondary | ICD-10-CM

## 2023-04-28 DIAGNOSIS — I10 Essential (primary) hypertension: Secondary | ICD-10-CM

## 2023-04-28 DIAGNOSIS — K219 Gastro-esophageal reflux disease without esophagitis: Secondary | ICD-10-CM

## 2023-04-28 DIAGNOSIS — F3289 Other specified depressive episodes: Secondary | ICD-10-CM

## 2023-04-28 DIAGNOSIS — R252 Cramp and spasm: Secondary | ICD-10-CM | POA: Diagnosis not present

## 2023-04-28 DIAGNOSIS — R7303 Prediabetes: Secondary | ICD-10-CM | POA: Diagnosis not present

## 2023-04-28 DIAGNOSIS — N1831 Chronic kidney disease, stage 3a: Secondary | ICD-10-CM | POA: Diagnosis not present

## 2023-04-28 DIAGNOSIS — F419 Anxiety disorder, unspecified: Secondary | ICD-10-CM

## 2023-04-28 MED ORDER — LOSARTAN POTASSIUM 25 MG PO TABS: 25.0000 mg | ORAL_TABLET | Freq: Every day | ORAL | 1 refills | Status: DC

## 2023-04-28 MED ORDER — PANTOPRAZOLE SODIUM 40 MG PO TBEC: 40.0000 mg | DELAYED_RELEASE_TABLET | Freq: Every day | ORAL | Status: DC

## 2023-04-28 MED ORDER — HYDROCHLOROTHIAZIDE 12.5 MG PO TABS: 12.5000 mg | ORAL_TABLET | Freq: Every day | ORAL | 3 refills | Status: DC

## 2023-04-28 NOTE — Assessment & Plan Note (Addendum)
Chronic Following with nephrology Continue increased water intake, avoiding NSAIDs Blood pressure well-controlled Will decrease HCTZ to 12.5 mg daily Start losartan 25 mg daily Has been trying to decrease-tapering off PPI CBC, CMP

## 2023-04-28 NOTE — Assessment & Plan Note (Addendum)
Chronic Blood pressure well controlled CMP She does get leg swelling if she misses the hydrochlorothiazide Given decreased kidney function we will decrease HCTZ to 12.5 mg daily and hopefully that will continue to control her leg swelling and start losartan 25 mg daily She will monitor her blood pressure at home Will get blood work approximately 1 week after starting the regimen to check kidney function, potassium

## 2023-04-28 NOTE — Assessment & Plan Note (Signed)
Chronic Lab Results  Component Value Date   HGBA1C 6.0 10/28/2022   Check a1c Low sugar / carb diet Stressed regular exercise

## 2023-04-28 NOTE — Progress Notes (Signed)
Subjective:   Jennifer Sanford is a 72 y.o. female who presents for an Initial Medicare Annual Wellness Visit.  Visit Complete: In person  Review of Systems    Cardiac Risk Factors include: advanced age (>65men, >59 women);hypertension;Other (see comment), Risk factor comments: CKD     Objective:    Today's Vitals   04/28/23 0818  Weight: 207 lb 3.2 oz (94 kg)  Height: 5\' 10"  (1.778 m)   Body mass index is 29.73 kg/m.     04/28/2023    8:23 AM 09/06/2022   10:21 AM 06/22/2022   12:55 PM 01/11/2021    9:42 AM 07/18/2019    1:19 PM 07/21/2017    6:29 PM 10/26/2015    4:03 PM  Advanced Directives  Does Patient Have a Medical Advance Directive? Yes No No No No Yes   Type of Estate agent of [place];Living will     Healthcare Power of Attorney   Copy of Healthcare Power of Attorney in Chart? No - copy requested     No - copy requested   Would patient like information on creating a medical advance directive?   No - Patient declined   No - Patient declined      Information is confidential and restricted. Go to Review Flowsheets to unlock data.    Current Medications (verified) Outpatient Encounter Medications as of 04/28/2023  Medication Sig   Apoaequorin (PREVAGEN) 10 MG CAPS Take by mouth.   B Complex Vitamins (B COMPLEX 50) TABS daily   Biotin 1000 MCG tablet    Calcium Carb-Cholecalciferol (CALCIUM 600 + D PO)    Cholecalciferol (VITAMIN D3) 1000 units CAPS daily   citalopram (CELEXA) 20 MG tablet TAKE 1 TABLET (20 MG TOTAL) BY MOUTH EVERY MORNING.   famotidine (PEPCID) 40 MG tablet TAKE 1 TABLET TWICE DAILY   fenofibrate 160 MG tablet TAKE 1 TABLET EVERY MORNING   hydrochlorothiazide (HYDRODIURIL) 25 MG tablet Take 1 tablet (25 mg total) by mouth daily.   latanoprost (XALATAN) 0.005 % ophthalmic solution Place 1 drop into the left eye at bedtime.   pantoprazole (PROTONIX) 40 MG tablet TAKE 1 TABLET TWICE DAILY BEFORE MEALS   VAGINAL LUBRICANT VA  Place vaginally. Patient currently using GYNATROF suppositories   Zinc 50 MG CAPS    [EXPIRED] predniSONE (DELTASONE) 20 MG tablet Take 2 tablets (40 mg total) by mouth daily with breakfast for 5 days.   valACYclovir (VALTREX) 1000 MG tablet Take 1 tablet (1,000 mg total) by mouth 3 (three) times daily. (Patient not taking: Reported on 04/28/2023)   [DISCONTINUED] meclizine (ANTIVERT) 12.5 MG tablet Take 1-2 tablets (12.5-25 mg total) by mouth 3 (three) times daily as needed for dizziness.   [DISCONTINUED] Omega-3 Fatty Acids (FISH OIL PO) Take by mouth. (Patient not taking: Reported on 04/20/2023)   No facility-administered encounter medications on file as of 04/28/2023.    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   Hx of transfusion of packed red blood cells    Hyperlipidemia    Hypertension    no meds s/p wt loss   OA (osteoarthritis)    Postop Transfusion 03/21/2013   Postoperative wound infection  Past Surgical History:  Procedure Laterality Date   BREAST REDUCTION SURGERY Bilateral 04-12-2008   CARDIOVASCULAR STRESS TEST  12-26-2009   Normal nuclear study/  no ischemia/  ef 65%   CYSTO/  RIGHT RETROGRADE PYELOGRAM/  BALLOON DILATION RIGHT URETER STRICTURE/  STENT PLACEMENT  05-25-2010//   03-14-2009//   01-28-2009   CYSTOSCOPY WITH STENT PLACEMENT Right 02/18/2014   Procedure: CYSTOSCOPY WITH right STENT PLACEMENT;  Surgeon: Danae Chen, MD;  Location: [facility];  Service: Urology;  Laterality: Right;   EXPLORATORY LAPAROTOMY W/ RIGHT URETER REPAIR  2000   INCISION AND DRAINAGE ABSCESS N/A 11/28/2014   Procedure: INCISION AND DRAINAGE ABSCESS;  Surgeon: Crist Fat, MD;  Location: [facility];  Service: Urology;  Laterality: N/A;   INCISION AND DRAINAGE ABSCESS N/A 02/15/2015   Procedure: INCISION AND DRAINAGE OF WOUND;  Surgeon: Crist Fat, MD;  Location: [facility];  Service: Urology;  Laterality: N/A;   KNEE ARTHROSCOPY  left  2004//   bilateral 1980   KNEE CLOSED REDUCTION Bilateral 06/02/2013   Procedure: CLOSED MANIPULATION BILATERAL KNEES;  Surgeon: Loanne Drilling, MD;  Location: [facility];  Service: Orthopedics;  Laterality: Bilateral;   REDUCTION MAMMAPLASTY     TONSILLECTOMY AND ADENOIDECTOMY  as child   TOTAL KNEE ARTHROPLASTY Bilateral 03/17/2013   Procedure: TOTAL KNEE BILATERAL;  Surgeon: Loanne Drilling, MD;  Location: [facility];  Service: Orthopedics;  Laterality: Bilateral;   URETERAL REIMPLANTION Right 09/13/2014   Procedure: OPEN RIGHT URETERAL REIMPLANT;  Surgeon: Crist Fat, MD;  Location: [facility];  Service: Urology;  Laterality: Right;   VAGINAL HYSTERECTOMY  2000   w/ right salpingoophorectomy   Family History  Problem Relation Age of Onset   Diabetes Mother        Deceased   Heart attack Mother    Dementia Mother    Hypertension Father        Deceased   Lung cancer Father    Cancer Other    Heart Problems Brother        CABG   Bipolar disorder Sister    Healthy Daughter    Breast cancer Maternal Grandmother    Social History   Socioeconomic History   Marital status: Divorced    Spouse name: Not on file   Number of children: 3   Years of education: Not on file   Highest education level: Not on file  Occupational History   Not on file  Tobacco Use   Smoking status: Never   Smokeless tobacco: Never  Vaping Use   Vaping status: Never Used  Substance and Sexual Activity   Alcohol use: No    Alcohol/week: 0.0 standard drinks of alcohol   Drug use: No   Sexual activity: Not on file  Other Topics Concern   Not on file  Social History Narrative   [place] [facility] [place].  Lives alone in a 2 story home.  Has 3 daughters.     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     Education: college.   Social Determinants of Health   Financial Resource Strain: Low Risk  (04/28/2023)   Overall Financial Resource Strain (CARDIA)    Difficulty of Paying Living Expenses: Not hard at all  Food Insecurity: No Food Insecurity (04/28/2023)   Hunger Vital Sign    Worried About Running Out of Food in the Last Year: Never true  Ran Out of Food in the Last Year: Never true  Transportation Needs: No Transportation Needs (04/28/2023)   PRAPARE - Administrator, Civil Service (Medical): No    Lack of Transportation (Non-Medical): No  Physical Activity: Sufficiently Active (04/28/2023)   Exercise Vital Sign    Days of Exercise per Week: 5 days    Minutes of Exercise per Session: 90 min  Stress: No Stress Concern Present (04/28/2023)   [employer] of Occupational Health - Occupational Stress Questionnaire    Feeling of Stress : Not at all  Social Connections: Moderately Integrated (04/28/2023)   Social Connection and Isolation Panel [NHANES]    Frequency of Communication with Friends and Family: More than three times a week    Frequency of Social Gatherings with Friends and Family: Twice a week    Attends Religious Services: More than 4 times per year    Active Member of [employer] or Organizations: Yes    Attends Engineer, structural: More than 4 times per year    Marital Status: Divorced    Tobacco Counseling Counseling given: Not Answered   Clinical Intake:  Pre-visit preparation completed: Yes  Pain : No/denies pain     BMI - recorded: 29.73 Diabetes: No  How often do you need to have someone help you when you read instructions, pamphlets, or other written materials from your doctor or pharmacy?: 1 - Never  Interpreter Needed?: No  Information entered by ::  , RMA   Activities of Daily Living    04/28/2023    8:20 AM  In  your present state of health, do you have any difficulty performing the following activities:  Hearing? 0  Vision? 0  Difficulty concentrating or making decisions? 0  Walking or climbing stairs? 0  Dressing or bathing? 0  Doing errands, shopping? 0  Preparing Food and eating ? N  Using the Toilet? Y  In the past six months, have you accidently leaked urine? N  Do you have problems with loss of bowel control? N  Managing your Medications? N  Managing your Finances? N  Housekeeping or managing your Housekeeping? N    Patient Care Team: Pincus Sanes, MD as PCP - General (Internal Medicine) Szabat, Vinnie Level, [facility] (Inactive) as Pharmacist (Pharmacist)  Indicate any recent Medical Services you may have received from other than [facility] providers in the past year (date may be approximate).     Assessment:   This is a routine wellness examination for Tana.  Hearing/Vision screen Hearing Screening - Comments:: Denies hearing difficulties   Vision Screening - Comments:: Wears eyeglasses  Dietary issues and exercise activities discussed:     Goals Addressed   None   Depression Screen    04/28/2023    8:28 AM 10/28/2022    9:07 AM 06/26/2022   10:50 AM 06/28/2020    9:24 AM 10/26/2015    4:03 PM  PHQ 2/9 Scores  PHQ - 2 Score 0 0 0 0   PHQ- 9 Score 0 0        Information is confidential and restricted. Go to Review Flowsheets to unlock data.    Fall Risk    04/28/2023    8:24 AM 10/28/2022    9:07 AM 06/26/2022   10:51 AM 08/27/2021    3:15 PM 06/28/2020    9:09 AM  Fall Risk   Falls in the past year? 0 0 1 0 0  Number falls in past yr: 0 0  0 0 0  Injury with Fall? 0 0 1 0 1  Risk for fall due to : No Fall Risks No Fall Risks No Fall Risks No Fall Risks No Fall Risks  Follow up Falls prevention discussed Falls evaluation completed Falls evaluation completed Falls evaluation completed Falls evaluation completed    MEDICARE RISK AT HOME:  Medicare Risk at Home - 04/28/23 0824      Any stairs in or around the home? Yes    If so, are there any without handrails? Yes    Home free of loose throw rugs in walkways, pet beds, electrical cords, etc? Yes    Adequate lighting in your home to reduce risk of falls? Yes    Life alert? No    Use of a cane, walker or w/c? No    Grab bars in the bathroom? No    Shower chair or bench in shower? No    Elevated toilet seat or a handicapped toilet? Yes             TIMED UP AND GO:  Was the test performed? Yes  Length of time to ambulate 10 feet: 10 sec Gait steady and fast without use of assistive device    Cognitive Function:      04/11/2016    1:43 PM  Montreal Cognitive Assessment   Visuospatial/ Executive (0/5)   Naming (0/3)   Attention: Read list of digits (0/2)   Attention: Read list of letters (0/1)   Attention: Serial 7 subtraction starting at 100 (0/3)   Language: Repeat phrase (0/2)   Language : Fluency (0/1)   Abstraction (0/2)   Delayed Recall (0/5)   Orientation (0/6)   Total      Information is confidential and restricted. Go to Review Flowsheets to unlock data.      04/28/2023    8:25 AM  6CIT Screen  What Year? 0 points  What month? 0 points  What time? 0 points  Count back from 20 0 points  Months in reverse 0 points  Repeat phrase 0 points  Total Score 0 points    Immunizations Immunization History  Administered Date(s) Administered   Fluad Quad(high Dose 65+) 06/27/2021   Influenza, High Dose Seasonal PF 07/09/2022   Influenza,inj,quad, With Preservative 06/23/2020   Influenza-Unspecified 06/12/2017   PFIZER(Purple Top)SARS-COV-2 Vaccination 05/23/2020   Pfizer Covid-19 Vaccine Bivalent Booster 7yrs & up 06/27/2021   Pneumococcal Conjugate-13 08/04/2018   Pneumococcal Polysaccharide-23 06/28/2020   Tdap 02/16/2019, 06/22/2022   Zoster Recombinant(Shingrix) 08/04/2018, 05/03/2019    TDAP status: Up to date  Flu Vaccine status: Up to date  Pneumococcal vaccine status:  Up to date  Covid-19 vaccine status: Completed vaccines  Qualifies for Shingles Vaccine? Yes   Zostavax completed Yes   Shingrix Completed?: Yes  Screening Tests Health Maintenance  Topic Date Due   INFLUENZA VACCINE  06/24/2023 (Originally 04/24/2023)   DEXA SCAN  04/20/2024   Medicare Annual Wellness (AWV)  04/27/2024   MAMMOGRAM  06/17/2024   DTaP/Tdap/Td (3 - Td or Tdap) 06/22/2032   Colonoscopy  11/01/2032   Pneumonia Vaccine 43+ Years old  Completed   Hepatitis C Screening  Completed   Zoster Vaccines- Shingrix  Completed   HPV VACCINES  Aged Out   COVID-19 Vaccine  Discontinued    Health Maintenance  There are no preventive care reminders to display for this patient.   Colorectal cancer screening: Type of screening: Colonoscopy. Completed 11/01/2022. Repeat every 10 years  Mammogram  status: Completed 06/17/2022. Repeat every year  Bone Density status: Completed 04/21/2019. Results reflect: Bone density results: NORMAL. Repeat every 10 years.  Lung Cancer Screening: (Low Dose CT Chest recommended if Age 72-80 years, 20 pack-year currently smoking OR have quit w/in 15years.) does not qualify.   Lung Cancer Screening Referral: N/A  Additional Screening:  Hepatitis C Screening: does qualify; Completed 01/29/2016  Vision Screening: Recommended annual ophthalmology exams for early detection of glaucoma and other disorders of the eye. Is the patient up to date with their annual eye exam?  Yes  Who is the provider or what is the name of the office in which the patient attends annual eye exams? Dr. Yetta Barre [facility]) If pt is not established with a provider, would they like to be referred to a provider to establish care? Yes .   Dental Screening: Recommended annual dental exams for proper oral hygiene   Community Resource Referral / Chronic Care Management: CRR required this visit?  No   CCM required this visit?  No     Plan:     I have personally reviewed and  noted the following in the patient's chart:   Medical and social history Use of alcohol, tobacco or illicit drugs  Current medications and supplements including opioid prescriptions. Patient is not currently taking opioid prescriptions. Functional ability and status Nutritional status Physical activity Advanced directives List of other physicians Hospitalizations, surgeries, and ER visits in previous 12 months Vitals Screenings to include cognitive, depression, and falls Referrals and appointments  In addition, I have reviewed and discussed with patient certain preventive protocols, quality metrics, and best practice recommendations. A written personalized care plan for preventive services as well as general preventive health recommendations were provided to patient.      L , CMA   04/28/2023   After Visit Summary: (MyChart) Due to this being a telephonic visit, the after visit summary with patients personalized plan was offered to patient via MyChart   Nurse Notes: Patient has no concerns today.  She has an up coming appointment for Mammogram in September.  Patient also has an appointment with Dr. Lawerance Bach today.

## 2023-04-28 NOTE — Patient Instructions (Signed)
Jennifer Sanford , Thank you for taking time to come for your Medicare Wellness Visit. I appreciate your ongoing commitment to your health goals. Please review the following plan we discussed and let me know if I can assist you in the future.   Referrals/Orders/Follow-Ups/Clinician Recommendations: Remember to get your mammogram scheduled soon.  It was a pleasure to meet you and keep up the good work.   This is a list of the screening recommended for you and due dates:  Health Maintenance  Topic Date Due   Flu Shot  06/24/2023*   DEXA scan (bone density measurement)  04/20/2024   Medicare Annual Wellness Visit  04/27/2024   Mammogram  06/17/2024   DTaP/Tdap/Td vaccine (3 - Td or Tdap) 06/22/2032   Colon Cancer Screening  11/01/2032   Pneumonia Vaccine  Completed   Hepatitis C Screening  Completed   Zoster (Shingles) Vaccine  Completed   HPV Vaccine  Aged Out   COVID-19 Vaccine  Discontinued  *Topic was postponed. The date shown is not the original due date.    Advanced directives: (Copy Requested) Please bring a copy of your health care power of attorney and living will to the office to be added to your chart at your convenience.  Next Medicare Annual Wellness Visit scheduled for next year: No  Preventive Care 75 Years and Older, Female Preventive care refers to lifestyle choices and visits with your health care provider that can promote health and wellness. What does preventive care include? A yearly physical exam. This is also called an annual well check. Dental exams once or twice a year. Routine eye exams. Ask your health care provider how often you should have your eyes checked. Personal lifestyle choices, including: Daily care of your teeth and gums. Regular physical activity. Eating a healthy diet. Avoiding tobacco and drug use. Limiting alcohol use. Practicing safe sex. Taking low-dose aspirin every day. Taking vitamin and mineral supplements as recommended by your health  care provider. What happens during an annual well check? The services and screenings done by your health care provider during your annual well check will depend on your age, overall health, lifestyle risk factors, and family history of disease. Counseling  Your health care provider may ask you questions about your: Alcohol use. Tobacco use. Drug use. Emotional well-being. Home and relationship well-being. Sexual activity. Eating habits. History of falls. Memory and ability to understand (cognition). Work and work Astronomer. Reproductive health. Screening  You may have the following tests or measurements: Height, weight, and BMI. Blood pressure. Lipid and cholesterol levels. These may be checked every 5 years, or more frequently if you are over 52 years old. Skin check. Lung cancer screening. You may have this screening every year starting at age 65 if you have a 30-pack-year history of smoking and currently smoke or have quit within the past 15 years. Fecal occult blood test (FOBT) of the stool. You may have this test every year starting at age 70. Flexible sigmoidoscopy or colonoscopy. You may have a sigmoidoscopy every 5 years or a colonoscopy every 10 years starting at age 52. Hepatitis C blood test. Hepatitis B blood test. Sexually transmitted disease (STD) testing. Diabetes screening. This is done by checking your blood sugar (glucose) after you have not eaten for a while (fasting). You may have this done every 1-3 years. Bone density scan. This is done to screen for osteoporosis. You may have this done starting at age 40. Mammogram. This may be done every 1-2 years.  Talk to your health care provider about how often you should have regular mammograms. Talk with your health care provider about your test results, treatment options, and if necessary, the need for more tests. Vaccines  Your health care provider may recommend certain vaccines, such as: Influenza vaccine. This is  recommended every year. Tetanus, diphtheria, and acellular pertussis (Tdap, Td) vaccine. You may need a Td booster every 10 years. Zoster vaccine. You may need this after age 71. Pneumococcal 13-valent conjugate (PCV13) vaccine. One dose is recommended after age 82. Pneumococcal polysaccharide (PPSV23) vaccine. One dose is recommended after age 5. Talk to your health care provider about which screenings and vaccines you need and how often you need them. This information is not intended to replace advice given to you by your health care provider. Make sure you discuss any questions you have with your health care provider. Document Released: 10/06/2015 Document Revised: 05/29/2016 Document Reviewed: 07/11/2015 Elsevier Interactive Patient Education  2017 [employer].  Fall Prevention in the Home Falls can cause injuries. They can happen to people of all ages. There are many things you can do to make your home safe and to help prevent falls. What can I do on the outside of my home? Regularly fix the edges of walkways and driveways and fix any cracks. Remove anything that might make you trip as you walk through a door, such as a raised step or threshold. Trim any bushes or trees on the path to your home. Use bright outdoor lighting. Clear any walking paths of anything that might make someone trip, such as rocks or tools. Regularly check to see if handrails are loose or broken. Make sure that both sides of any steps have handrails. Any raised decks and porches should have guardrails on the edges. Have any leaves, snow, or ice cleared regularly. Use sand or salt on walking paths during winter. Clean up any spills in your garage right away. This includes oil or grease spills. What can I do in the bathroom? Use night lights. Install grab bars by the toilet and in the tub and shower. Do not use towel bars as grab bars. Use non-skid mats or decals in the tub or shower. If you need to sit down in  the shower, use a plastic, non-slip stool. Keep the floor dry. Clean up any water that spills on the floor as soon as it happens. Remove soap buildup in the tub or shower regularly. Attach bath mats securely with double-sided non-slip rug tape. Do not have throw rugs and other things on the floor that can make you trip. What can I do in the bedroom? Use night lights. Make sure that you have a light by your bed that is easy to reach. Do not use any sheets or blankets that are too big for your bed. They should not hang down onto the floor. Have a firm chair that has side arms. You can use this for support while you get dressed. Do not have throw rugs and other things on the floor that can make you trip. What can I do in the kitchen? Clean up any spills right away. Avoid walking on wet floors. Keep items that you use a lot in easy-to-reach places. If you need to reach something above you, use a strong step stool that has a grab bar. Keep electrical cords out of the way. Do not use floor polish or wax that makes floors slippery. If you must use wax, use non-skid floor wax.  Do not have throw rugs and other things on the floor that can make you trip. What can I do with my stairs? Do not leave any items on the stairs. Make sure that there are handrails on both sides of the stairs and use them. Fix handrails that are broken or loose. Make sure that handrails are as long as the stairways. Check any carpeting to make sure that it is firmly attached to the stairs. Fix any carpet that is loose or worn. Avoid having throw rugs at the top or bottom of the stairs. If you do have throw rugs, attach them to the floor with carpet tape. Make sure that you have a light switch at the top of the stairs and the bottom of the stairs. If you do not have them, ask someone to add them for you. What else can I do to help prevent falls? Wear shoes that: Do not have high heels. Have rubber bottoms. Are comfortable  and fit you well. Are closed at the toe. Do not wear sandals. If you use a stepladder: Make sure that it is fully opened. Do not climb a closed stepladder. Make sure that both sides of the stepladder are locked into place. Ask someone to hold it for you, if possible. Clearly mark and make sure that you can see: Any grab bars or handrails. First and last steps. Where the edge of each step is. Use tools that help you move around (mobility aids) if they are needed. These include: Canes. Walkers. Scooters. Crutches. Turn on the lights when you go into a dark area. Replace any light bulbs as soon as they burn out. Set up your furniture so you have a clear path. Avoid moving your furniture around. If any of your floors are uneven, fix them. If there are any pets around you, be aware of where they are. Review your medicines with your doctor. Some medicines can make you feel dizzy. This can increase your chance of falling. Ask your doctor what other things that you can do to help prevent falls. This information is not intended to replace advice given to you by your health care provider. Make sure you discuss any questions you have with your health care provider. Document Released: 07/06/2009 Document Revised: 02/15/2016 Document Reviewed: 10/14/2014 Elsevier Interactive Patient Education  2017 [employer].

## 2023-04-28 NOTE — Assessment & Plan Note (Signed)
Chronic Controlled, Stable Continue citalopram 20 mg daily 

## 2023-04-28 NOTE — Assessment & Plan Note (Addendum)
Chronic GERD controlled Continue Pepcid 40 mg twice daily, pantoprazole 40 mg once daily Will start taking pantoprazole 40 mg daily every other day and slowly taper to get off of it-discussed this could take a long time GERD diet

## 2023-04-28 NOTE — Assessment & Plan Note (Signed)
Chronic Controlled, Stable Continue Celexa 20 mg daily

## 2023-04-28 NOTE — Assessment & Plan Note (Signed)
Chronic Regular exercise and healthy diet encouraged Encouraged weight loss Check lipid panel  Continue fenofibrate 160 mg daily

## 2023-05-27 ENCOUNTER — Other Ambulatory Visit (INDEPENDENT_AMBULATORY_CARE_PROVIDER_SITE_OTHER): Payer: Medicare HMO

## 2023-05-27 DIAGNOSIS — I10 Essential (primary) hypertension: Secondary | ICD-10-CM

## 2023-05-27 DIAGNOSIS — N1831 Chronic kidney disease, stage 3a: Secondary | ICD-10-CM

## 2023-05-27 DIAGNOSIS — R7303 Prediabetes: Secondary | ICD-10-CM

## 2023-05-27 DIAGNOSIS — R252 Cramp and spasm: Secondary | ICD-10-CM | POA: Diagnosis not present

## 2023-05-27 DIAGNOSIS — E781 Pure hyperglyceridemia: Secondary | ICD-10-CM | POA: Diagnosis not present

## 2023-05-27 LAB — CBC WITH DIFFERENTIAL/PLATELET
Basophils Absolute: 0 10*3/uL (ref 0.0–0.1)
Basophils Relative: 0.4 % (ref 0.0–3.0)
Eosinophils Absolute: 0.1 10*3/uL (ref 0.0–0.7)
Eosinophils Relative: 1.3 % (ref 0.0–5.0)
HCT: 37.7 % (ref 36.0–46.0)
Hemoglobin: 12.3 g/dL (ref 12.0–15.0)
Lymphocytes Relative: 51.8 % — ABNORMAL HIGH (ref 12.0–46.0)
Lymphs Abs: 3.2 10*3/uL (ref 0.7–4.0)
MCHC: 32.7 g/dL (ref 30.0–36.0)
MCV: 89.4 fl (ref 78.0–100.0)
Monocytes Absolute: 0.6 10*3/uL (ref 0.1–1.0)
Monocytes Relative: 9.1 % (ref 3.0–12.0)
Neutro Abs: 2.3 10*3/uL (ref 1.4–7.7)
Neutrophils Relative %: 37.4 % — ABNORMAL LOW (ref 43.0–77.0)
Platelets: 281 10*3/uL (ref 150.0–400.0)
RBC: 4.22 Mil/uL (ref 3.87–5.11)
RDW: 14.7 % (ref 11.5–15.5)
WBC: 6.2 10*3/uL (ref 4.0–10.5)

## 2023-05-27 LAB — LIPID PANEL
Cholesterol: 194 mg/dL (ref 0–200)
HDL: 52.3 mg/dL (ref >39.00)
LDL Cholesterol: 128 mg/dL — ABNORMAL HIGH (ref 0–99)
NonHDL: 142.17
Total CHOL/HDL Ratio: 4
Triglycerides: 73 mg/dL (ref 0.0–149.0)
VLDL: 14.6 mg/dL (ref 0.0–40.0)

## 2023-05-27 LAB — COMPREHENSIVE METABOLIC PANEL
ALT: 14 U/L (ref 0–35)
AST: 18 U/L (ref 0–37)
Albumin: 3.7 g/dL (ref 3.5–5.2)
Alkaline Phosphatase: 61 U/L (ref 39–117)
BUN: 19 mg/dL (ref 6–23)
CO2: 30 meq/L (ref 19–32)
Calcium: 9.3 mg/dL (ref 8.4–10.5)
Chloride: 102 meq/L (ref 96–112)
Creatinine, Ser: 1.11 mg/dL (ref 0.40–1.20)
GFR: 50.57 mL/min — ABNORMAL LOW (ref >60.00)
Glucose, Bld: 71 mg/dL (ref 70–99)
Potassium: 3.8 meq/L (ref 3.5–5.1)
Sodium: 140 meq/L (ref 135–145)
Total Bilirubin: 0.5 mg/dL (ref 0.2–1.2)
Total Protein: 6.9 g/dL (ref 6.0–8.3)

## 2023-05-27 LAB — MAGNESIUM: Magnesium: 1.5 mg/dL (ref 1.5–2.5)

## 2023-05-27 LAB — HEMOGLOBIN A1C: Hgb A1c MFr Bld: 6 % (ref 4.6–6.5)

## 2023-05-28 LAB — TSH: TSH: 0.77 u[IU]/mL (ref 0.35–5.50)

## 2023-06-20 ENCOUNTER — Other Ambulatory Visit: Payer: Self-pay | Admitting: Internal Medicine

## 2023-06-20 DIAGNOSIS — Z1231 Encounter for screening mammogram for malignant neoplasm of breast: Secondary | ICD-10-CM

## 2023-06-24 ENCOUNTER — Other Ambulatory Visit (HOSPITAL_COMMUNITY): Payer: Self-pay

## 2023-06-24 MED ORDER — INFLUENZA VAC A&B SURF ANT ADJ 0.5 ML IM SUSY
0.5000 mL | PREFILLED_SYRINGE | Freq: Once | INTRAMUSCULAR | 0 refills | Status: AC
  Filled 2023-06-24: qty 0.5, 1d supply, fill #0

## 2023-06-27 ENCOUNTER — Ambulatory Visit
Admission: RE | Admit: 2023-06-27 | Discharge: 2023-06-27 | Disposition: A | Discharge status: Home / Self Care | Payer: Medicare HMO | Source: Ambulatory Visit | Attending: Internal Medicine

## 2023-06-27 DIAGNOSIS — Z1231 Encounter for screening mammogram for malignant neoplasm of breast: Secondary | ICD-10-CM

## 2023-07-09 ENCOUNTER — Other Ambulatory Visit (HOSPITAL_COMMUNITY): Payer: Self-pay

## 2023-07-28 IMAGING — US US PELVIS COMPLETE WITH TRANSVAGINAL
1 series · 14 of 25 positions shown · non-contrast
Comparison: 11/22/2014

CLINICAL DATA: Lower pelvic pain for a few months, vaginal pain,
prior hysterectomy

EXAM:
TRANSABDOMINAL AND TRANSVAGINAL ULTRASOUND OF PELVIS
TECHNIQUE: Both transabdominal and transvaginal ultrasound examinations of the
pelvis were performed. Transabdominal technique was performed for
global imaging of the pelvis including uterus, ovaries, adnexal
regions, and pelvic cul-de-sac. It was necessary to proceed with
endovaginal exam following the transabdominal exam to visualize the
LEFT ovary and adnexa.

[Series 1: us pelvis complete with transvaginal · 0.16mm/px · 14 of 51 slices shown]
[im 1/51]
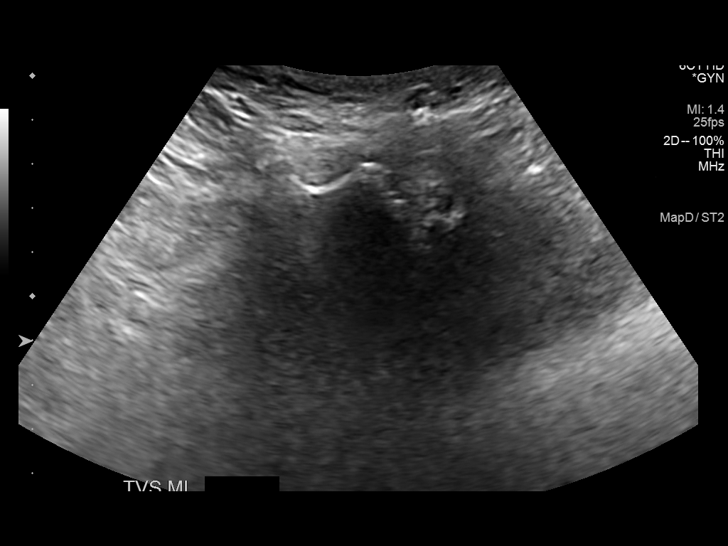
[im 5/51]
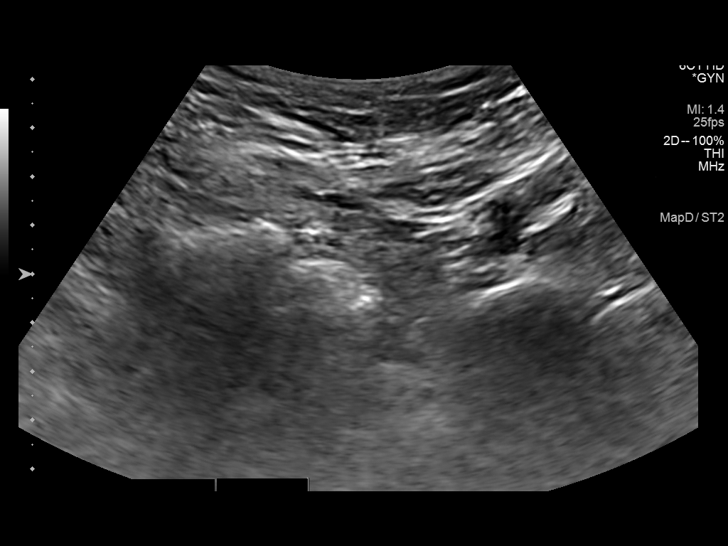
[im 9/51]
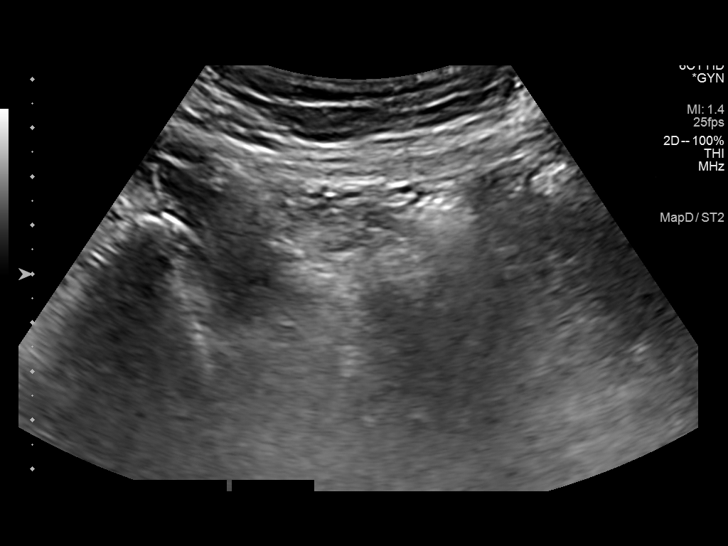
[im 13/51]
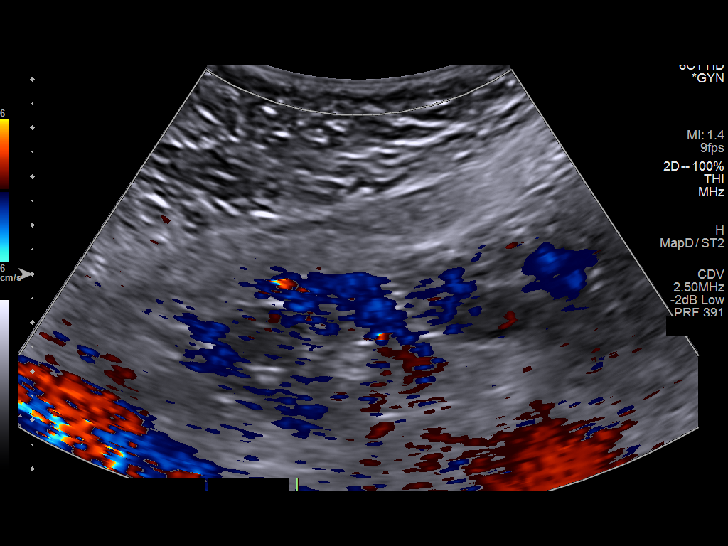
[im 17/51]
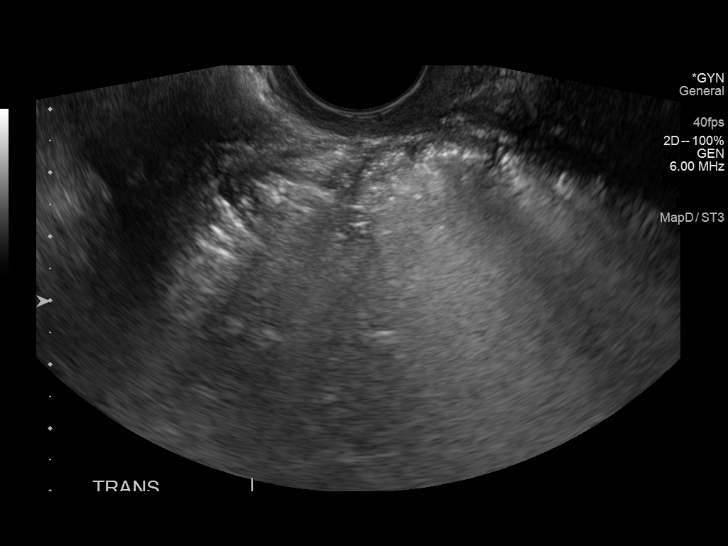
[im 19/51]
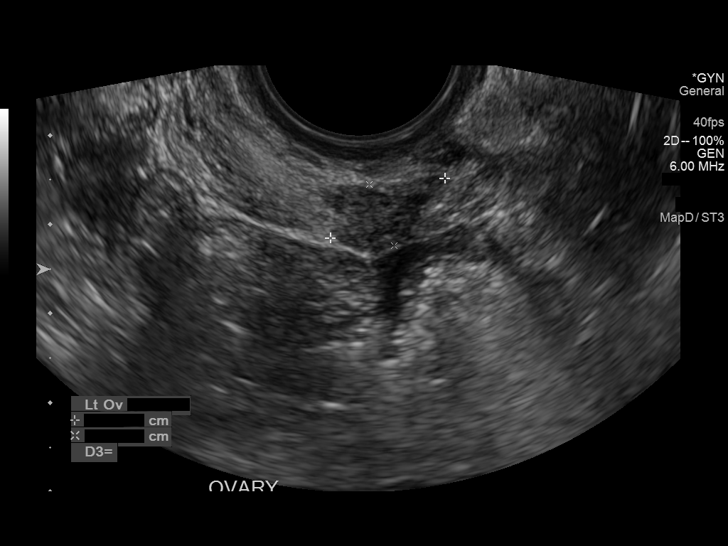
[im 23/51]
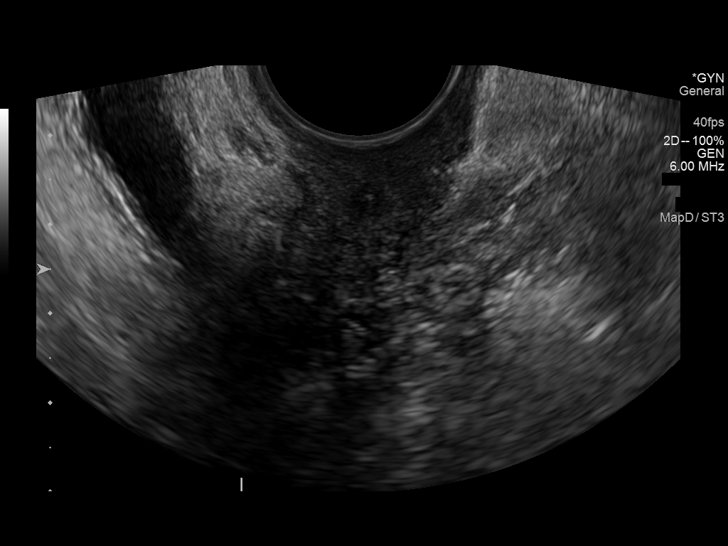
[im 28/51]
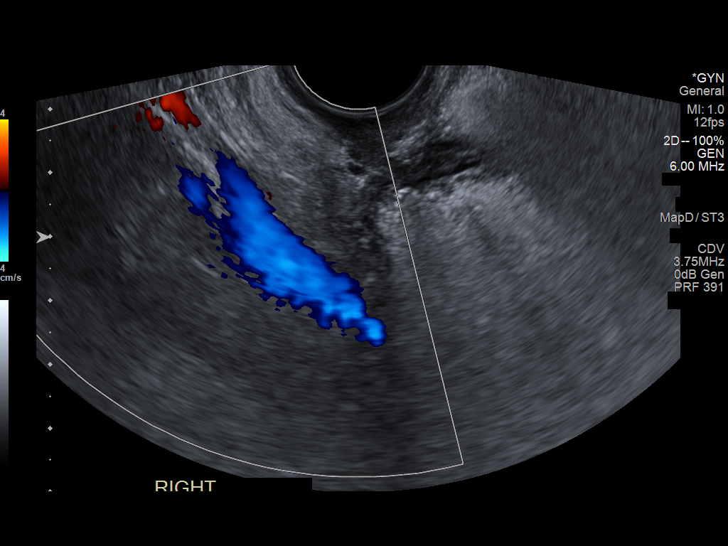
[im 32/51]
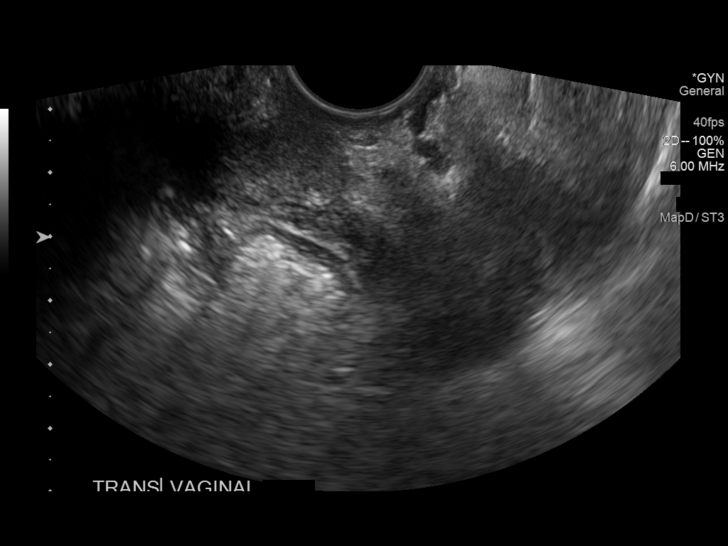
[im 34/51]
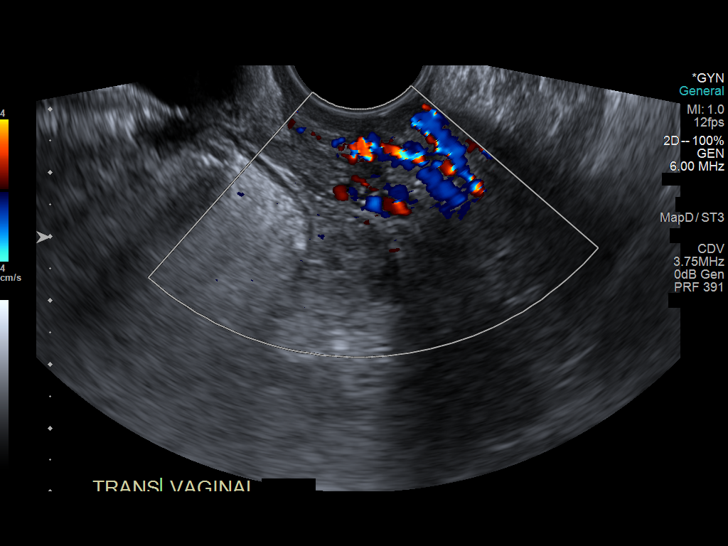
[im 38/51]
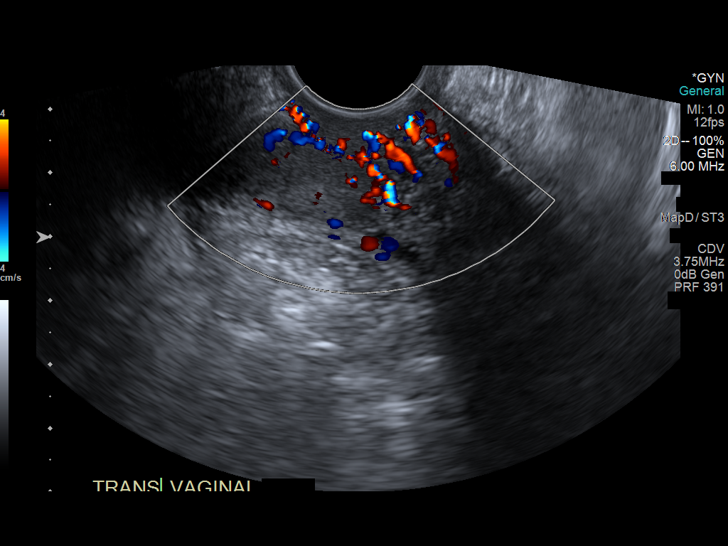
[im 42/51]
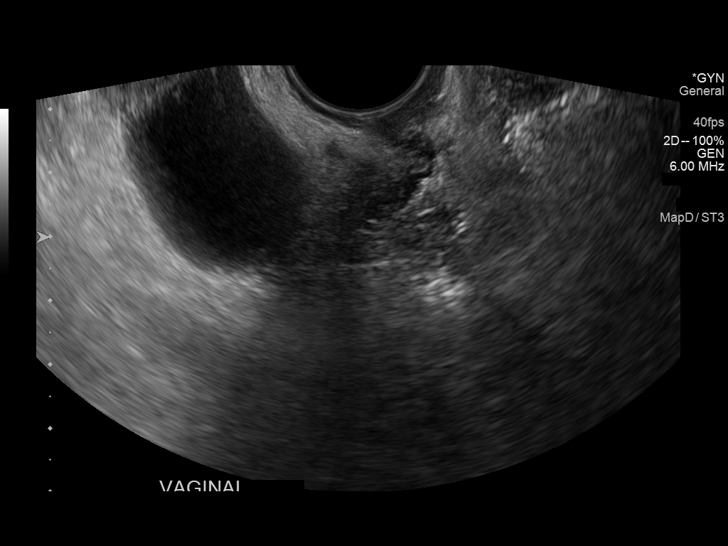
[im 46/51]
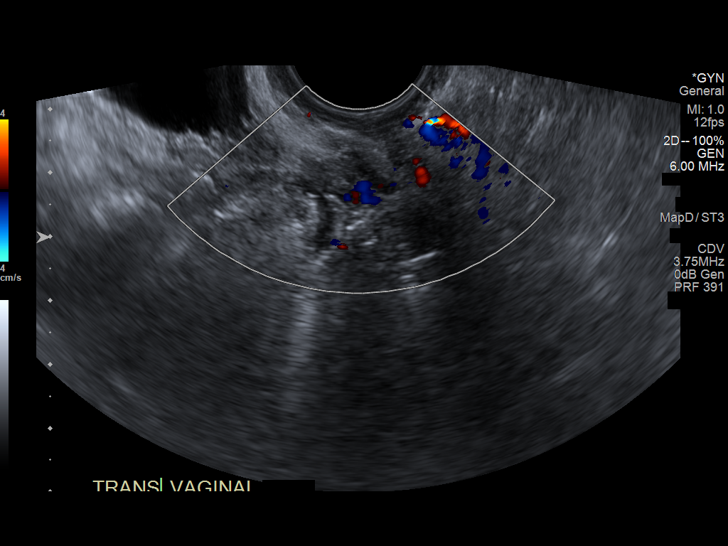
[im 51/51]
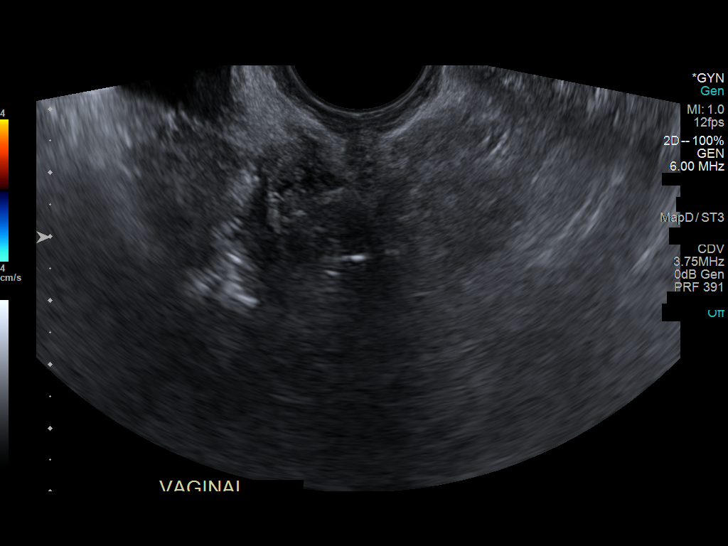

[14 of 25 positions shown; findings below may reference images not displayed]

FINDINGS: Uterus

Surgically absent

Endometrium

Surgically absent

Right ovary

Surgically absent

Left ovary

Measurements: 1.5 x 0.7 x 1.1 cm = volume: 0.6 mL. Normal morphology
without mass

Other findings

No free pelvic fluid or adnexal masses. Visualized vaginal cuff
unremarkable.
IMPRESSION: No pelvic sonographic abnormalities.

## 2023-08-12 ENCOUNTER — Encounter: Payer: Self-pay | Admitting: Internal Medicine

## 2023-08-12 DIAGNOSIS — I129 Hypertensive chronic kidney disease with stage 1 through stage 4 chronic kidney disease, or unspecified chronic kidney disease: Secondary | ICD-10-CM | POA: Diagnosis not present

## 2023-08-12 DIAGNOSIS — Z9889 Other specified postprocedural states: Secondary | ICD-10-CM | POA: Diagnosis not present

## 2023-08-12 DIAGNOSIS — K219 Gastro-esophageal reflux disease without esophagitis: Secondary | ICD-10-CM | POA: Diagnosis not present

## 2023-08-12 DIAGNOSIS — N390 Urinary tract infection, site not specified: Secondary | ICD-10-CM | POA: Diagnosis not present

## 2023-08-12 DIAGNOSIS — N1831 Chronic kidney disease, stage 3a: Secondary | ICD-10-CM | POA: Diagnosis not present

## 2023-08-12 DIAGNOSIS — R7303 Prediabetes: Secondary | ICD-10-CM | POA: Diagnosis not present

## 2023-08-12 NOTE — Progress Notes (Unsigned)
    Subjective:    Patient ID: Jennifer Sanford, female    DOB: 1951-01-26, 72 y.o.   MRN: [id number]      HPI Jennifer Sanford is here for No chief complaint on file.   Cognitive concerns-     Medications and allergies reviewed with patient and updated if appropriate.  Current Outpatient Medications on File Prior to Visit  Medication Sig Dispense Refill   Apoaequorin (PREVAGEN) 10 MG CAPS Take by mouth.     B Complex Vitamins (B COMPLEX 50) TABS daily  0   Biotin 1000 MCG tablet      Calcium Carb-Cholecalciferol (CALCIUM 600 + D PO)      Cholecalciferol (VITAMIN D3) 1000 units CAPS daily 60 capsule    citalopram (CELEXA) 20 MG tablet TAKE 1 TABLET (20 MG TOTAL) BY MOUTH EVERY MORNING. 90 tablet 3   famotidine (PEPCID) 40 MG tablet TAKE 1 TABLET TWICE DAILY 180 tablet 3   fenofibrate 160 MG tablet TAKE 1 TABLET EVERY MORNING 90 tablet 3   hydrochlorothiazide (HYDRODIURIL) 12.5 MG tablet Take 1 tablet (12.5 mg total) by mouth daily. 90 tablet 3   latanoprost (XALATAN) 0.005 % ophthalmic solution Place 1 drop into the left eye at bedtime.     losartan (COZAAR) 25 MG tablet Take 1 tablet (25 mg total) by mouth daily. 90 tablet 1   pantoprazole (PROTONIX) 40 MG tablet Take 1 tablet (40 mg total) by mouth daily.     VAGINAL LUBRICANT VA Place vaginally. Patient currently using GYNATROF suppositories     Zinc 50 MG CAPS      No current facility-administered medications on file prior to visit.    Review of Systems     Objective:  There were no vitals filed for this visit. BP Readings from Last 3 Encounters:  04/28/23 112/72  04/20/23 113/75  10/28/22 110/70   Wt Readings from Last 3 Encounters:  04/28/23 207 lb (93.9 kg)  04/28/23 207 lb 3.2 oz (94 kg)  10/28/22 215 lb (97.5 kg)   There is no height or weight on file to calculate BMI.    Physical Exam         Assessment & Plan:    See Problem List for Assessment and Plan of chronic medical problems.

## 2023-08-13 ENCOUNTER — Ambulatory Visit: Payer: Medicare HMO | Admitting: Internal Medicine

## 2023-08-13 ENCOUNTER — Telehealth: Payer: Self-pay | Admitting: Internal Medicine

## 2023-08-13 VITALS — BP 132/78 | HR 70 | Temp 98.2°F | Ht 70.0 in | Wt 207.0 lb

## 2023-08-13 DIAGNOSIS — R41840 Attention and concentration deficit: Secondary | ICD-10-CM

## 2023-08-13 DIAGNOSIS — N1831 Chronic kidney disease, stage 3a: Secondary | ICD-10-CM | POA: Diagnosis not present

## 2023-08-13 DIAGNOSIS — F419 Anxiety disorder, unspecified: Secondary | ICD-10-CM | POA: Diagnosis not present

## 2023-08-13 DIAGNOSIS — R413 Other amnesia: Secondary | ICD-10-CM | POA: Diagnosis not present

## 2023-08-13 LAB — LAB REPORT - SCANNED
Albumin, Urine POC: 8.9
Creatinine, POC: 135.4 mg/dL
EGFR: 54
Microalb Creat Ratio: 7

## 2023-08-13 MED ORDER — VALSARTAN 80 MG PO TABS: 80.0000 mg | ORAL_TABLET | Freq: Every day | ORAL | 3 refills | Status: DC

## 2023-08-13 MED ORDER — DIAZEPAM 5 MG PO TABS: ORAL_TABLET | ORAL | 0 refills | Status: DC

## 2023-08-13 NOTE — Assessment & Plan Note (Addendum)
Chronic Following with nephrology-Dr. Arrie Aran Continue increased water intake, avoiding NSAIDs Blood pressure not ideally controlled Continue HCTZ 12.5 mg daily Discontinue losartan and start valsartan 80 mg daily She will check her blood pressure closely at home

## 2023-08-13 NOTE — Telephone Encounter (Signed)
Patient called and said that she needs the valium sent in for the MRI that is scheduled for 09/08/2023.  [facility] on [address]

## 2023-08-13 NOTE — Patient Instructions (Addendum)
      Medications changes include :   stop losartan.  Start valsartan 80 mg daily  Goal BP < 130/80 -- monitor BP at home and update me after two weeks.   A referral was ordered neuropsychology and an MRI of your brain was ordered and someone will call you to schedule an appointment.     Return for follow up as scheduled.

## 2023-08-13 NOTE — Assessment & Plan Note (Signed)
States memory concerns Both her and her kids are concerned about her memory She has had several concussions over the years and one of her concerns is if that is causing some of her mental issues She states forgetfulness-forgetting minor things and most recently twice she forgot to put the car in park before getting out She has a general feeling of something not right in her head MRI of brain ordered Referral to neuropsychology ordered

## 2023-08-13 NOTE — Assessment & Plan Note (Signed)
Chronic Overall controlled Most of her anxiety is related to her kids her life Continue citalopram 20 mg daily

## 2023-08-13 NOTE — Assessment & Plan Note (Signed)
Chronic She states difficulty concentrating which is not new She is concerned that this is related to her concussions

## 2023-08-15 ENCOUNTER — Encounter: Payer: Self-pay | Admitting: Physician Assistant

## 2023-08-20 ENCOUNTER — Encounter: Payer: Self-pay | Admitting: Nephrology

## 2023-09-03 ENCOUNTER — Encounter: Payer: Self-pay | Admitting: Internal Medicine

## 2023-09-03 NOTE — Progress Notes (Incomplete)
Assessment/Plan:   Jennifer Sanford is a very pleasant 72 y.o. year old RH female with a history of hypertension, hyperlipidemia, CKD stage III, arthritis with chronic pain, GERD, anxiety, depression, seen today for evaluation of memory loss. MoCA today is /30.***.  Workup is in progress.  Patient is able to participate on his IADLs and continues to drive without significant difficulties.***    Memory Impairment of unclear etiology  MRI brain without contrast to assess for underlying structural abnormality and assess vascular load has been scheduled by PCP for 09/08/2023, will follow the results Neurocognitive testing to further evaluate cognitive concerns and determine other underlying cause of memory changes, including potential contribution from sleep, anxiety, attention, or depression  Check B12, TSH Continue to control mood as per PCP Recommend good control of cardiovascular risk factors Folllow up in 1-2  months***  Subjective:   The patient is accompanied by ***  who supplements the history.   How long did patient have memory difficulties? For the last 8 years when she presented to our office with same complaints, especially issues with short-term memory and repetition, scoring and MoCA of 25/30.  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  Counseling was recommended.***.  She returns to our office with similar symptoms, reports some difficulty remembering new information, conversations and names, new information and reports.  I years difficulty concentrating.  Long-term memory is good. repeats oneself?  Endorsed Disoriented when walking into a room?  Patient denies except occasionally not remembering what patient came to the room for ***  Leaving  objects in unusual places? Denies.   Wandering behavior?  denies .  Any personality changes?  Denies.   Any history of depression?:  Denies   Hallucinations or paranoia?  Denies   Seizures?  Denies    Any sleep changes?   Sleeps well***does not sleep well***denies vivid dreams, REM behavior or sleepwalking   Sleep apnea?  Denies   Any hygiene concerns?  Denies   Independent of bathing and dressing?  Endorsed  Does the patient needs help with medications? Patient is in charge *** Who is in charge of the finances? Patient is in charge   *** Any changes in appetite?  Denies ***   Patient have trouble swallowing? Denies.   Does the patient cook? ***Not much. Any kitchen accidents such as leaving the stove on? Denies.   Any history of headaches?   Denies.   Chronic pain ? Denies.   Ambulates with difficulty?  Denies. *** Recent falls or head injuries? Denies.   Vision changes? Denies.   Unilateral weakness, numbness or tingling? Denies.   Any tremors?   Denies.   Any anosmia?  Denies.   Any incontinence of urine? Denies.   Any bowel dysfunction? Denies.      Patient lives alone*** History of heavy alcohol intake? Denies.   History of heavy tobacco use? Denies.   Family history of dementia?  Mother had dementia. Does patient drive? 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  Hx of transfusion of packed red blood cells    Hyperlipidemia    Hypertension    no meds s/p wt loss   OA (osteoarthritis)    Postop Transfusion 03/21/2013   Postoperative wound infection      Past Surgical History:  Procedure Laterality Date   BREAST REDUCTION SURGERY Bilateral 04-12-2008   CARDIOVASCULAR STRESS TEST  12-26-2009   Normal nuclear study/   no ischemia/  ef 65%   CYSTO/  RIGHT RETROGRADE PYELOGRAM/  BALLOON DILATION RIGHT URETER STRICTURE/  STENT PLACEMENT  05-25-2010//   03-14-2009//   01-28-2009   CYSTOSCOPY WITH STENT PLACEMENT Right 02/18/2014   Procedure: CYSTOSCOPY WITH right STENT PLACEMENT;  Surgeon: Danae Chen, MD;  Location: [facility];  Service: Urology;  Laterality: Right;   EXPLORATORY LAPAROTOMY W/ RIGHT URETER REPAIR  2000   INCISION AND DRAINAGE ABSCESS N/A 11/28/2014   Procedure: INCISION AND DRAINAGE ABSCESS;  Surgeon: Crist Fat, MD;  Location: [facility];  Service: Urology;  Laterality: N/A;   INCISION AND DRAINAGE ABSCESS N/A 02/15/2015   Procedure: INCISION AND DRAINAGE OF WOUND;  Surgeon: Crist Fat, MD;  Location: [facility];  Service: Urology;  Laterality: N/A;   KNEE ARTHROSCOPY  left  2004//   bilateral 1980   KNEE CLOSED REDUCTION Bilateral 06/02/2013   Procedure: CLOSED MANIPULATION BILATERAL KNEES;  Surgeon: Loanne Drilling, MD;  Location: [facility];  Service: Orthopedics;  Laterality: Bilateral;   REDUCTION MAMMAPLASTY     TONSILLECTOMY AND ADENOIDECTOMY  as child   TOTAL KNEE ARTHROPLASTY Bilateral 03/17/2013   Procedure: TOTAL KNEE BILATERAL;  Surgeon: Loanne Drilling, MD;  Location: [facility];  Service: Orthopedics;  Laterality: Bilateral;   URETERAL REIMPLANTION Right 09/13/2014   Procedure: OPEN RIGHT URETERAL REIMPLANT;  Surgeon: Crist Fat, MD;  Location: [facility];  Service: Urology;  Laterality: Right;   VAGINAL HYSTERECTOMY  2000   w/ right salpingoophorectomy     Allergies  Allergen Reactions   Advil [Ibuprofen] Hives   Mixed Feathers Itching   Naproxen Hives   Shrimp [Shellfish Allergy] Itching    Current Outpatient Medications  Medication Instructions   Apoaequorin (PREVAGEN) 10 MG CAPS Oral   B Complex Vitamins (B COMPLEX 50) TABS daily   Biotin 1000 MCG tablet No dose, route, or frequency recorded.   Calcium  Carb-Cholecalciferol (CALCIUM 600 + D PO) No dose, route, or frequency recorded.   Cholecalciferol (VITAMIN D3) 1000 units CAPS daily   citalopram (CELEXA) 20 mg, Oral, Every morning   diazepam (VALIUM) 5 MG tablet Take 1 tab 1 hour prior to MRI, may repeat dose if needed   famotidine (PEPCID) 40 MG tablet TAKE 1 TABLET TWICE DAILY   fenofibrate 160 mg, Oral, Every morning   hydrochlorothiazide (HYDRODIURIL) 12.5 mg, Oral, Daily   latanoprost (XALATAN) 0.005 % ophthalmic solution 1 drop, Left Eye, Daily at bedtime   pantoprazole (PROTONIX) 40 mg, Oral, Daily   VAGINAL LUBRICANT VA Vaginal, Patient currently using GYNATROF suppositories   valsartan (DIOVAN) 80 mg, Oral, Daily   Zinc 50 MG CAPS No dose, route, or frequency recorded.     VITALS:  There were no vitals filed for this visit.    PHYSICAL EXAM   HEENT:  Normocephalic, atraumatic. The superficial temporal arteries are without ropiness or tenderness. Cardiovascular: Regular rate and rhythm. Lungs: Clear to auscultation bilaterally. Neck: There are no carotid bruits noted bilaterally.  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 Go to Review Flowsheets to unlock data.        No data to display           Orientation:  Alert and oriented to person, place and time. No aphasia or dysarthria. Fund of knowledge is appropriate. Recent memory impaired and remote memory intact.  Attention and concentration are normal.  Able to name objects and repeat phrases. Delayed recall  /5 Cranial nerves: There is good facial symmetry. Extraocular  muscles are intact and visual fields are full to confrontational testing. Speech is fluent and clear. No tongue deviation. Hearing is intact to conversational tone. Tone: Tone is good throughout. Sensation: Sensation is intact to light touch. Vibration is intact at the bilateral big toe.  Coordination: The patient has no difficulty with RAM's or FNF bilaterally. Normal finger to nose  Motor: Strength is 5/5 in the bilateral upper and lower extremities. There is no pronator drift. There are no fasciculations noted. DTR's: Deep tendon reflexes are 2/4 bilaterally. Gait and Station: The patient is able to ambulate without difficulty The patient is able to heel toe walk . Gait is cautious and narrow. The patient is able to ambulate in a tandem fashion.       Thank you for allowing [place] the opportunity to participate in the care of this nice patient. Please do not hesitate to contact us for any questions or concerns.   Total time spent on today's visit was *** minutes dedicated to this patient today, preparing to see patient, examining the patient, ordering tests and/or medications and counseling the patient, documenting clinical information in the EHR or other health record, independently interpreting results and communicating results to the patient/family, discussing treatment and goals, answering patient's questions and coordinating care.  Cc:  Pincus Sanes, MD  Marlowe Kays 09/03/2023 7:04 AM

## 2023-09-04 ENCOUNTER — Other Ambulatory Visit: Payer: Self-pay

## 2023-09-04 ENCOUNTER — Emergency Department (HOSPITAL_BASED_OUTPATIENT_CLINIC_OR_DEPARTMENT_OTHER)
Admission: EM | Admit: 2023-09-04 | Discharge: 2023-09-04 | Disposition: A | Discharge status: Home / Self Care | Payer: Medicare HMO | Attending: Emergency Medicine | Admitting: Emergency Medicine

## 2023-09-04 ENCOUNTER — Encounter: Payer: Self-pay | Admitting: Internal Medicine

## 2023-09-04 ENCOUNTER — Encounter (HOSPITAL_BASED_OUTPATIENT_CLINIC_OR_DEPARTMENT_OTHER): Payer: Self-pay | Admitting: Emergency Medicine

## 2023-09-04 ENCOUNTER — Emergency Department (HOSPITAL_BASED_OUTPATIENT_CLINIC_OR_DEPARTMENT_OTHER): Payer: Medicare HMO

## 2023-09-04 ENCOUNTER — Encounter: Payer: Medicare HMO | Admitting: Physician Assistant

## 2023-09-04 DIAGNOSIS — J069 Acute upper respiratory infection, unspecified: Secondary | ICD-10-CM | POA: Insufficient documentation

## 2023-09-04 DIAGNOSIS — Z79899 Other long term (current) drug therapy: Secondary | ICD-10-CM | POA: Insufficient documentation

## 2023-09-04 DIAGNOSIS — E860 Dehydration: Secondary | ICD-10-CM | POA: Insufficient documentation

## 2023-09-04 DIAGNOSIS — Z20822 Contact with and (suspected) exposure to covid-19: Secondary | ICD-10-CM | POA: Diagnosis not present

## 2023-09-04 DIAGNOSIS — R55 Syncope and collapse: Secondary | ICD-10-CM | POA: Insufficient documentation

## 2023-09-04 DIAGNOSIS — I10 Essential (primary) hypertension: Secondary | ICD-10-CM | POA: Insufficient documentation

## 2023-09-04 DIAGNOSIS — I959 Hypotension, unspecified: Secondary | ICD-10-CM | POA: Diagnosis not present

## 2023-09-04 DIAGNOSIS — R42 Dizziness and giddiness: Secondary | ICD-10-CM

## 2023-09-04 DIAGNOSIS — I517 Cardiomegaly: Secondary | ICD-10-CM | POA: Diagnosis not present

## 2023-09-04 DIAGNOSIS — R059 Cough, unspecified: Secondary | ICD-10-CM | POA: Diagnosis not present

## 2023-09-04 LAB — CBC
HCT: 37.6 % (ref 36.0–46.0)
Hemoglobin: 12.4 g/dL (ref 12.0–15.0)
MCH: 29.7 pg (ref 26.0–34.0)
MCHC: 33 g/dL (ref 30.0–36.0)
MCV: 90 fL (ref 80.0–100.0)
Platelets: 278 10*3/uL (ref 150–400)
RBC: 4.18 MIL/uL (ref 3.87–5.11)
RDW: 14.1 % (ref 11.5–15.5)
WBC: 8.6 10*3/uL (ref 4.0–10.5)
nRBC: 0 % (ref 0.0–0.2)

## 2023-09-04 LAB — BASIC METABOLIC PANEL
Anion gap: 7 (ref 5–15)
BUN: 27 mg/dL — ABNORMAL HIGH (ref 8–23)
CO2: 29 mmol/L (ref 22–32)
Calcium: 9.4 mg/dL (ref 8.9–10.3)
Chloride: 102 mmol/L (ref 98–111)
Creatinine, Ser: 1.26 mg/dL — ABNORMAL HIGH (ref 0.44–1.00)
GFR, Estimated: 46 mL/min — ABNORMAL LOW (ref >60)
Glucose, Bld: 84 mg/dL (ref 70–99)
Potassium: 3.8 mmol/L (ref 3.5–5.1)
Sodium: 138 mmol/L (ref 135–145)

## 2023-09-04 LAB — RESP PANEL BY RT-PCR (RSV, FLU A&B, COVID)  RVPGX2
Influenza A by PCR: NEGATIVE
Influenza B by PCR: NEGATIVE
Resp Syncytial Virus by PCR: NEGATIVE
SARS Coronavirus 2 by RT PCR: NEGATIVE

## 2023-09-04 LAB — TROPONIN I (HIGH SENSITIVITY): Troponin I (High Sensitivity): 3 ng/L (ref <18)

## 2023-09-04 MED ORDER — LACTATED RINGERS IV BOLUS
1000.0000 mL | Freq: Once | INTRAVENOUS | Status: AC
  Administered 2023-09-04: 1000 mL via INTRAVENOUS

## 2023-09-04 NOTE — ED Provider Notes (Signed)
[place] EMERGENCY DEPARTMENT AT [facility] Provider Note   CSN: [id number] Arrival date & time: 09/04/23  2048     History  Chief Complaint  Patient presents with   Near Syncope    Jennifer Sanford is a 72 y.o. female.  72 year old female with a history of orthostasis, hypertension, hyperlipidemia, and GERD who presents to the emergency department with presyncope.  Patient reports that for several days has had a cold.  Is having headache, chills, congestion, and cough.  Says that she was taking a bath and Epsom salts today and when she stood up she felt very dizzy.  Said that for about 15 minutes she felt as though she was going to pass out.  Felt somewhat short of breath as well during this episode.  No palpitations, chest pain, LOC.  No bleeding recently or nausea or vomiting or diarrhea.  Took a negative COVID test at home.  Does have a history of this happening in the past when she gets dehydrated.       Home Medications Prior to Admission medications   Medication Sig Start Date End Date Taking? Authorizing Provider  Apoaequorin (PREVAGEN) 10 MG CAPS Take by mouth.    [provider]  B Complex Vitamins (B COMPLEX 50) TABS daily 01/29/16   Burns, Bobette Mo, MD  Biotin 1000 MCG tablet     [provider]  Calcium Carb-Cholecalciferol (CALCIUM 600 + D PO)     [provider]  Cholecalciferol (VITAMIN D3) 1000 units CAPS daily 01/29/16   Burns, Bobette Mo, MD  citalopram (CELEXA) 20 MG tablet TAKE 1 TABLET (20 MG TOTAL) BY MOUTH EVERY MORNING. 04/11/21   Pincus Sanes, MD  diazepam (VALIUM) 5 MG tablet Take 1 tab 1 hour prior to MRI, may repeat dose if needed 08/13/23   Pincus Sanes, MD  famotidine (PEPCID) 40 MG tablet TAKE 1 TABLET TWICE DAILY 09/05/23   Pincus Sanes, MD  fenofibrate 160 MG tablet TAKE 1 TABLET EVERY MORNING 09/05/23   Burns, Bobette Mo, MD  hydrochlorothiazide (HYDRODIURIL) 12.5 MG tablet Take 1 tablet (12.5 mg total) by  mouth daily. 04/28/23   Burns, Bobette Mo, MD  latanoprost (XALATAN) 0.005 % ophthalmic solution Place 1 drop into the left eye at bedtime. 09/22/22   [provider]  pantoprazole (PROTONIX) 40 MG tablet TAKE 1 TABLET TWICE DAILY BEFORE MEALS 09/05/23   Burns, Bobette Mo, MD  VAGINAL LUBRICANT VA Place vaginally. Patient currently using GYNATROF suppositories    [provider]  valsartan (DIOVAN) 80 MG tablet Take 1 tablet (80 mg total) by mouth daily. 08/13/23   Pincus Sanes, MD  Zinc 50 MG CAPS     [provider]      Allergies    Advil [ibuprofen], Mixed feathers, Naproxen, and Shrimp [shellfish allergy]    Review of Systems   Review of Systems  Physical Exam Updated Vital Signs BP 138/69   Pulse 66   Temp 98.4 F (36.9 C) (Oral)   Resp 18   SpO2 99%  Physical Exam Vitals and nursing note reviewed.  Constitutional:      General: She is not in acute distress.    Appearance: She is well-developed.  HENT:     Head: Normocephalic and atraumatic.     Right Ear: External ear normal.     Left Ear: External ear normal.     Nose: Nose normal.  Eyes:     Extraocular Movements:  Extraocular movements intact.     Conjunctiva/sclera: Conjunctivae normal.     Pupils: Pupils are equal, round, and reactive to light.  Cardiovascular:     Rate and Rhythm: Normal rate and regular rhythm.     Heart sounds: No murmur heard. Pulmonary:     Effort: Pulmonary effort is normal. No respiratory distress.     Breath sounds: Normal breath sounds.  Musculoskeletal:     Cervical back: Normal range of motion and neck supple.     Right lower leg: No edema.     Left lower leg: No edema.  Skin:    General: Skin is warm and dry.  Neurological:     Mental Status: She is alert and oriented to person, place, and time. Mental status is at baseline.  Psychiatric:        Mood and Affect: Mood normal.     ED Results / Procedures / Treatments   Labs (all labs ordered are  listed, but only abnormal results are displayed) Labs Reviewed  BASIC METABOLIC PANEL - Abnormal; Notable for the following components:      Result Value   BUN 27 (*)    Creatinine, Ser 1.26 (*)    GFR, Estimated 46 (*)    All other components within normal limits  RESP PANEL BY RT-PCR (RSV, FLU A&B, COVID)  [id number]  CBC  TROPONIN I (HIGH SENSITIVITY)    EKG EKG Interpretation Date/Time:  Thursday September 04 2023 21:00:03 EST Ventricular Rate:  68 PR Interval:  136 QRS Duration:  82 QT Interval:  390 QTC Calculation: 414 R Axis:   58  Text Interpretation: Normal sinus rhythm Normal ECG Confirmed by Vonita Moss [phone]) on 09/04/2023 9:33:56 PM  Radiology DG Chest Portable 1 View Result Date: 09/04/2023 CLINICAL DATA:  Passed out at home, hypotension, and cough. EXAM: PORTABLE CHEST 1 VIEW COMPARISON:  PA Lat 07/21/2017 FINDINGS: There is mild cardiomegaly, without evidence of CHF. The mediastinum is normally outlined. Lungs are clear. Thoracic spondylosis. Comparison to prior study reveals no significant interval change. IMPRESSION: No evidence of acute chest disease. Stable chest with mild cardiomegaly. Electronically Signed   By: Almira Bar M.D.   On: 09/04/2023 22:13    Procedures Procedures    Medications Ordered in ED Medications  lactated ringers bolus 1,000 mL (0 mLs Intravenous Stopped 09/04/23 2252)    ED Course/ Medical Decision Making/ A&P                                 Medical Decision Making Amount and/or Complexity of Data Reviewed Labs: ordered. Radiology: ordered.   Jennifer Sanford is a 72 y.o. female with comorbidities that complicate the patient evaluation including orthostasis, hypertension, hyperlipidemia, and GERD who presents to the emergency department with presyncope.   Initial Ddx:  URI, pneumonia, orthostasis, dehydration  MDM/Course:  Patient presents emergency department with a presyncopal event.  Has had this happen  before.  Also was having URI symptoms at this time.  Is very well-appearing on exam.  Appears somewhat dehydrated.  Suspect that she likely had orthostasis in the setting of her URI.  In the emergency department COVID and flu were sent which were negative.  Chest x-ray without pneumonia.  She is not septic at this time.  Was given fluids and upon re-evaluation was feeling better.  No AKI on her lab work.  EKG and troponin without concerning features such  as WPW, Brugada, or long QT.  Will have her follow-up with her primary doctor in several days.  Counseled her to stay well-hydrated.  This patient presents to the ED for concern of complaints listed in HPI, this involves an extensive number of treatment options, and is a complaint that carries with it a high risk of complications and morbidity. Disposition including potential need for admission considered.   Dispo: DC Home. Return precautions discussed including, but not limited to, those listed in the AVS. Allowed pt time to ask questions which were answered fully prior to dc.  Additional history obtained from family Records reviewed Outpatient Clinic Notes The following labs were independently interpreted: Chemistry and show no acute abnormality I independently reviewed the following imaging with scope of interpretation limited to determining acute life threatening conditions related to emergency care: Chest x-ray and agree with the radiologist interpretation with the following exceptions: none I personally reviewed and interpreted cardiac monitoring: normal sinus rhythm  I personally reviewed and interpreted the pt's EKG: see above for interpretation  I have reviewed the patients home medications and made adjustments as needed Social Determinants of health:  Elderly  Portions of this note were generated with Scientist, clinical (histocompatibility and immunogenetics). Dictation errors may occur despite best attempts at proofreading.     Final Clinical Impression(s) / ED  Diagnoses Final diagnoses:  Postural dizziness with presyncope  Dehydration  Upper respiratory tract infection, unspecified type    Rx / DC Orders ED Discharge Orders     None         Rondel Baton, MD 09/05/23 1242

## 2023-09-04 NOTE — ED Triage Notes (Signed)
Reports taking 25 minute hot bath. Stood up and felt light-headed. Sat down-no LOC. BP reported to by SYS BP 100's this morning-still took BP meds. Takes blood pressure medicine. Recent changes to BP meds-still trying to find right combination. Similar episodes in October.   Also has been fighting head cold- negative covid test at home.

## 2023-09-04 NOTE — Discharge Instructions (Signed)
You were seen for almost passing out in the emergency department.  It is likely from dehydration.  At home, please stay well-hydrated.    Check your MyChart online for the results of any tests that had not resulted by the time you left the emergency department.   Follow-up with your primary doctor in 2-3 days regarding your visit.    Return immediately to the emergency department if you experience any of the following: Loss of consciousness, or any other concerning symptoms.    Thank you for visiting our Emergency Department. It was a pleasure taking care of you today.

## 2023-09-04 NOTE — ED Notes (Signed)
Pt. States here SBP this am was 117, she ate and drank then took her BP medication as prescribed.

## 2023-09-05 ENCOUNTER — Other Ambulatory Visit: Payer: Self-pay | Admitting: Internal Medicine

## 2023-09-07 ENCOUNTER — Other Ambulatory Visit: Payer: Medicare HMO

## 2023-09-08 ENCOUNTER — Ambulatory Visit
Admission: RE | Admit: 2023-09-08 | Discharge: 2023-09-08 | Disposition: A | Discharge status: Home / Self Care | Payer: Medicare HMO | Source: Ambulatory Visit | Attending: Internal Medicine | Admitting: Internal Medicine

## 2023-09-08 DIAGNOSIS — I6782 Cerebral ischemia: Secondary | ICD-10-CM | POA: Diagnosis not present

## 2023-09-08 DIAGNOSIS — R413 Other amnesia: Secondary | ICD-10-CM

## 2023-09-13 ENCOUNTER — Other Ambulatory Visit: Payer: Self-pay | Admitting: Internal Medicine

## 2023-09-28 ENCOUNTER — Encounter: Payer: Self-pay | Admitting: Internal Medicine

## 2023-09-28 ENCOUNTER — Other Ambulatory Visit: Payer: Self-pay | Admitting: Internal Medicine

## 2023-09-28 DIAGNOSIS — I679 Cerebrovascular disease, unspecified: Secondary | ICD-10-CM | POA: Insufficient documentation

## 2023-09-28 NOTE — Progress Notes (Addendum)
 Assessment/Plan:   Jennifer Sanford is a very pleasant 73 y.o. year old RH female, Facilities Manager at [employer]  with a history of hypertension, hyperlipidemia, CKD stage III, BPPV, knee arthritis with chronic pain, GERD, anxiety, depression, prediabetes, seen today for evaluation of memory concerns. MoCA today is 27/30 with intact fluency and delayed recall. There might be a component of attention deficit, she required several repetitions to instructions to properly perform, especially with calculations and visuospatial. MRI of the brain December 2024, personally reviewed is without acute abnormalities, mild to moderate chronic microvascular ischemic changes and mild generalized parenchymal volume loss.  Empty sella is noted. Patient is able to participate in IADLs and continues to drive and to work  without significant difficulties. Mood is controlled, she attends counseling.    Memory Difficulties of unclear etiology  Neurocognitive testing to further evaluate cognitive concerns and determine other underlying cause of memory changes, including potential contribution from sleep, anxiety, attention, or depression  Check B12 and hormonal panel (empty sella) including ACTH , Growth Hormone, IGF, Prolactin, Thyroid  Panel with TSH and LH, FSH and Vasopressin, pending on the results may need referral to endocrine, will follow Continue to control mood as per PCP, continue counseling Recommend to discuss with PCP, if ADD is suspected on testing, available therapeutic options as this is not treated at our Neurology facility Recommend good control of cardiovascular risk factors [place] up in 3 months   Subjective:   The patient is here alone   How long did patient have memory difficulties? For the last 8 years when she presented to our office with same complaints regarding short-term memory and repetition, scoring and MoCA of 25/30.  At that time, it was felt that her mild cognitive impairment  was likely due to anxiety and depression, confirmed by neuropsychological evaluation on 06/01/2016, when no cognitive disorder or psychiatric disorder was noted, subjective in nature.  She returns to our office with similar symptoms, reports some difficulty remembering new information, names,  and difficulty concentrating for the last year.  Long-term memory is good.  She initially took Prevagen without results, my doctor told me not to waste my money on it. These issues do not affect her at work. She is able to perform without difficulty.. She feels more forgetful turning 72 did something to me. She is bothered when she  may not remember what the Juliene said or the songs that have been played. She states that she may have undiagnosed ADD/ADHD, gets distracted when performing a task and jumps to another task. repeats oneself?  Endorsed, especially noted by daughter Disoriented when walking into a room?  Patient denies except occasionally not remembering what patient came to the room for.    Leaving objects in unusual places? Denies.   Wandering behavior?  Denies.   Any personality changes?  Denies.   Any history of depression?:  Denies.   Hallucinations or paranoia?  Denies   Seizures?  Denies    Any sleep changes?   Sleeps well . Sometimes she dreams  denies, REM behavior or sleepwalking   Sleep apnea?  Denies   Any hygiene concerns?  Denies   Independent of bathing and dressing?  Endorsed  Does the patient needs help with medications? Patient is in charge   Who is in charge of the finances? Patient is in charge.     Any changes in appetite?  Denies     Patient have trouble swallowing? Denies.   Does the  patient cook?  Not much. Denies forgetting common recipes.  Any kitchen accidents such as leaving the stove on? Denies.   Any history of headaches?  Denies.   Chronic pain ? Denies.   Ambulates with difficulty?  Denies.   Recent falls or head injuries?  She had a concussion on April 2022 ,  after a mechanical fall, hitting the occipital area without LOC. In October 2023 she sustained another  mechanical fall  hitting the R ear and temporal area, no LOC, requiring stiches.   Vision changes? Denies.   Unilateral weakness, numbness or tingling? Denies.   Any tremors?   Denies.   Any anosmia?  Denies.   Any incontinence of urine? Denies. 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 Denies.      Patient lives alone  History of heavy alcohol intake? Denies.   History of heavy tobacco use? Denies.   Family history of dementia?  Mother had dementia ?type it was really mild.. Does patient drive? 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  Empty sella is noted  Pertinent labs, TSH 0.77, magnesium  1.5, lipid panel remarkable for LDL 128 otherwise normal, CMP normal, A1c 6.0  Past Medical History:  Diagnosis Date   Complication of anesthesia    VERY CLAUSTROPHIC W/ MASK   Depression    GERD (gastroesophageal reflux disease)    History of ureter repair    right ureteral reimplant for stricture 09-03-2014   Hx of transfusion of packed red blood cells    Hyperlipidemia    Hypertension    no meds s/p wt loss   OA (osteoarthritis)    Postop Transfusion 03/21/2013   Postoperative wound infection      Past Surgical History:  Procedure Laterality Date   BREAST REDUCTION SURGERY Bilateral 04-12-2008   CARDIOVASCULAR STRESS TEST  12-26-2009   Normal nuclear study/  no ischemia/  ef 65%   CYSTO/  RIGHT RETROGRADE PYELOGRAM/  BALLOON DILATION RIGHT URETER STRICTURE/  STENT PLACEMENT  05-25-2010//   03-14-2009//   01-28-2009   CYSTOSCOPY WITH STENT PLACEMENT Right 02/18/2014   Procedure: CYSTOSCOPY WITH right STENT  PLACEMENT;  Surgeon: Oliva VEAR Oiler, MD;  Location: [facility];  Service: Urology;  Laterality: Right;   EXPLORATORY LAPAROTOMY W/ RIGHT URETER REPAIR  2000   INCISION AND DRAINAGE ABSCESS N/A 11/28/2014   Procedure: INCISION AND DRAINAGE ABSCESS;  Surgeon: Morene LELON Salines, MD;  Location: [facility];  Service: Urology;  Laterality: N/A;   INCISION AND DRAINAGE ABSCESS N/A 02/15/2015   Procedure: INCISION AND DRAINAGE OF WOUND;  Surgeon: Morene LELON Salines, MD;  Location: [facility];  Service: Urology;  Laterality: N/A;   KNEE ARTHROSCOPY  left  2004//   bilateral 1980   KNEE CLOSED REDUCTION Bilateral 06/02/2013   Procedure: CLOSED MANIPULATION BILATERAL KNEES;  Surgeon: Dempsey LULLA Moan, MD;  Location: [facility];  Service: Orthopedics;  Laterality: Bilateral;   REDUCTION MAMMAPLASTY     TONSILLECTOMY AND ADENOIDECTOMY  as child   TOTAL KNEE ARTHROPLASTY Bilateral 03/17/2013   Procedure: TOTAL KNEE BILATERAL;  Surgeon: Dempsey LULLA Moan, MD;  Location: [facility];  Service: Orthopedics;  Laterality: Bilateral;   URETERAL REIMPLANTION Right 09/13/2014   Procedure: OPEN RIGHT URETERAL REIMPLANT;  Surgeon: Morene LELON Salines, MD;  Location: [facility];  Service: Urology;  Laterality: Right;   VAGINAL HYSTERECTOMY  2000   w/ right salpingoophorectomy     Allergies  Allergen Reactions   Advil  [Ibuprofen ] Hives   Mixed Feathers Itching   Naproxen Hives   Shrimp [Shellfish Allergy] Itching    Current Outpatient Medications  Medication Instructions   B Complex Vitamins (B COMPLEX 50) TABS daily   Biotin 1000 MCG tablet No dose, route, or frequency recorded.   Calcium  Carb-Cholecalciferol (CALCIUM  600 + D PO) No dose, route, or frequency recorded.   Cholecalciferol (VITAMIN D3) 1000 units CAPS daily   citalopram  (CELEXA ) 20 mg, Oral, Every morning   diazepam  (VALIUM ) 5 MG tablet Take 1 tab 1 hour prior to MRI, may repeat dose if needed   famotidine  (PEPCID ) 40  mg, Oral, 2 times daily   fenofibrate  160 mg, Oral, Every morning   hydrochlorothiazide  (HYDRODIURIL ) 12.5 mg, Oral, Daily   latanoprost (XALATAN) 0.005 % ophthalmic solution 1 drop, Daily at bedtime   losartan  (COZAAR ) 25 mg, Oral, Daily   pantoprazole  (PROTONIX ) 40 MG tablet TAKE 1 TABLET TWICE DAILY BEFORE MEALS   VAGINAL LUBRICANT VA Place vaginally. Patient currently using GYNATROF suppositories   valsartan  (DIOVAN ) 80 mg, Oral, Daily   Zinc 50 MG CAPS No dose, route, or frequency recorded.     VITALS:   Vitals:   09/30/23 0811  BP: 115/65  Resp: 20  SpO2: 98%  Weight: 216 lb (98 kg)  Height: 5' 10 (1.778 m)      PHYSICAL EXAM   HEENT:  Normocephalic, atraumatic. The superficial temporal arteries are without ropiness or tenderness. Cardiovascular: Regular rate and rhythm. Lungs: Clear to auscultation bilaterally. Neck: There are no carotid bruits noted bilaterally.  NEUROLOGICAL:    04/11/2016    1:43 PM  Montreal Cognitive Assessment   Visuospatial/ Executive (0/5)   Naming (0/3)   Attention: Read list of digits (0/2)   Attention: Read list of letters (0/1)   Attention: Serial 7 subtraction starting at 100 (0/3)   Language: Repeat phrase (0/2)   Language : Fluency (0/1)   Abstraction (0/2)   Delayed Recall (0/5)   Orientation (0/6)   Total      Information is confidential and restricted. Go to Review Flowsheets to unlock data.        No data to display           Orientation:  Alert and oriented to person, place and time. No aphasia or dysarthria. Fund of knowledge is appropriate. Recent memory and remote memory intact.  Attention and concentration are normal.  Able to name objects and repeat phrases. Delayed recall  5/5 Cranial nerves: There is good facial symmetry. Extraocular muscles are intact and visual fields are full to confrontational testing. Speech is fluent and clear. No tongue deviation. Hearing is intact to conversational tone. Tone: Tone is  good throughout. Sensation: Sensation is intact to light touch. Vibration is intact at the bilateral big toe.  Coordination: The patient has no difficulty with RAM's or FNF bilaterally. Normal finger to nose  Motor: Strength is 5/5 in the bilateral upper and lower extremities. There is no pronator drift. There are no fasciculations noted. DTR's: Deep tendon reflexes are 2/4 bilaterally. Gait and Station: The patient is able to ambulate without difficulty The patient is able to heel toe walk . Gait is cautious and narrow. The patient is able to ambulate in a tandem fashion.  Thank you for allowing us  the opportunity to participate in the care of this nice patient. Please do not hesitate to contact us  for any questions or concerns.   Total time spent on today's visit was 78 minutes dedicated to this patient today, preparing to see patient, examining the patient, ordering tests and/or medications and counseling the patient, documenting clinical information in the EHR or other health record, independently interpreting results and communicating results to the patient/family, discussing treatment and goals, answering patient's questions and coordinating care.  Cc:  Geofm Glade PARAS, MD  Camie Sevin 09/30/2023 10:23 AM

## 2023-09-29 ENCOUNTER — Encounter: Payer: Self-pay | Admitting: Internal Medicine

## 2023-09-29 DIAGNOSIS — E782 Mixed hyperlipidemia: Secondary | ICD-10-CM

## 2023-09-30 ENCOUNTER — Encounter: Payer: Self-pay | Admitting: Physician Assistant

## 2023-09-30 ENCOUNTER — Other Ambulatory Visit: Payer: Medicare HMO

## 2023-09-30 ENCOUNTER — Ambulatory Visit: Payer: Medicare HMO | Admitting: Physician Assistant

## 2023-09-30 VITALS — BP 115/65 | Resp 20 | Ht 70.0 in | Wt 216.0 lb

## 2023-09-30 DIAGNOSIS — R413 Other amnesia: Secondary | ICD-10-CM

## 2023-09-30 DIAGNOSIS — E236 Other disorders of pituitary gland: Secondary | ICD-10-CM

## 2023-09-30 NOTE — Patient Instructions (Addendum)
 It was a pleasure to see you today at our office.   Recommendations:  Neurocognitive evaluation at our office   Check labs today  [address] Follow up in 3 months     For psychiatric meds, mood meds: Please have your primary care physician manage these medications.  If you have any severe symptoms of a stroke, or other severe issues such as confusion,severe chills or fever, etc call 911 or go to the ER as you may need to be evaluated further   For guidance regarding [facility] and if placement were needed at the facility, contact Social Worker tel: [phone]  For assessment of decision of mental capacity and competency:  Call Dr. Rosaline Nine, geriatric psychiatrist at [phone]  Counseling regarding caregiver distress, including caregiver depression, anxiety and issues regarding community resources, adult day care programs, adult living facilities, or memory care questions:  please contact your  Primary Doctor's Social Worker   Whom to call: Memory  decline, memory medications: Call our office [phone]    [email or web address]   RECOMMENDATIONS FOR ALL PATIENTS WITH MEMORY PROBLEMS: 1. Continue to exercise (Recommend 30 minutes of walking everyday, or 3 hours every week) 2. Increase social interactions - continue going to [place] and enjoy social gatherings with friends and family 3. Eat healthy, avoid fried foods and eat more fruits and vegetables 4. Maintain adequate blood pressure, blood sugar, and blood cholesterol level. Reducing the risk of stroke and cardiovascular disease also helps promoting better memory. 5. Avoid stressful situations. Live a simple life and avoid aggravations. Organize your time and prepare for the next day in anticipation. 6. Sleep well, avoid any interruptions of sleep and avoid any distractions in the bedroom that may interfere with adequate sleep quality 7. Avoid sugar,  avoid sweets as there is a strong link between excessive sugar intake, diabetes, and cognitive impairment We discussed the Mediterranean diet, which has been shown to help patients reduce the risk of progressive memory disorders and reduces cardiovascular risk. This includes eating fish, eat fruits and green leafy vegetables, nuts like almonds and hazelnuts, walnuts, and also use olive oil. Avoid fast foods and fried foods as much as possible. Avoid sweets and sugar as sugar use has been linked to worsening of memory function.  There is always a concern of gradual progression of memory problems. If this is the case, then we may need to adjust level of care according to patient needs. Support, both to the patient and caregiver, should then be put into place.      You have been referred for a neuropsychological evaluation (i.e., evaluation of memory and thinking abilities). Please bring someone with you to this appointment if possible, as it is helpful for the doctor to hear from both you and another adult who knows you well. Please bring eyeglasses and hearing aids if you wear them.    The evaluation will take approximately 3 hours and has two parts:   The first part is a clinical interview with the neuropsychologist (Dr. Richie or Dr. Jackquline). During the interview, the neuropsychologist will speak with you and the individual you brought to the appointment.    The second part of the evaluation is testing with the doctor's technician Neal or Luke). During the testing, the technician will ask you to remember different types of material, solve problems, and answer some questionnaires. Your family member will not be present for this portion of the evaluation.   Please note: We must reserve several  hours of the neuropsychologist's time and the psychometrician's time for your evaluation appointment. As such, there is a No-Show fee of $100. If you are unable to attend any of your appointments, please contact  our office as soon as possible to reschedule.      DRIVING: Regarding driving, in patients with progressive memory problems, driving will be impaired. We advise to have someone else do the driving if trouble finding directions or if minor accidents are reported. Independent driving assessment is available to determine safety of driving.   If you are interested in the driving assessment, you can contact the following:  [facility] [employer] [facility] in [place] [phone]  [facility] [phone]  [facility] [phone]  [facility] [phone] or [phone]   FALL PRECAUTIONS: Be cautious when walking. Scan the area for obstacles that may increase the risk of trips and falls. When getting up in the mornings, sit up at the edge of the bed for a few minutes before getting out of bed. Consider elevating the bed at the head end to avoid drop of blood pressure when getting up. Walk always in a well-lit room (use night lights in the walls). Avoid area rugs or power cords from appliances in the middle of the walkways. Use a walker or a cane if necessary and consider physical therapy for balance exercise. Get your eyesight checked regularly.  FINANCIAL OVERSIGHT: Supervision, especially oversight when making financial decisions or transactions is also recommended.  HOME SAFETY: Consider the safety of the kitchen when operating appliances like stoves, microwave oven, and blender. Consider having supervision and share cooking responsibilities until no longer able to participate in those. Accidents with firearms and other hazards in the house should be identified and addressed as well.   ABILITY TO BE LEFT ALONE: If patient is unable to contact 911 operator, consider using LifeLine, or when the need is there, arrange for someone to stay with patients. Smoking is a fire hazard, consider supervision or cessation. Risk of wandering should be assessed by caregiver and  if detected at any point, supervision and safe proof recommendations should be instituted.  MEDICATION SUPERVISION: Inability to self-administer medication needs to be constantly addressed. Implement a mechanism to ensure safe administration of the medications.      Mediterranean Diet A Mediterranean diet refers to food and lifestyle choices that are based on the traditions of countries located on the [employer] [place]. This way of eating has been shown to help prevent certain conditions and improve outcomes for people who have chronic diseases, like kidney disease and heart disease. What are tips for following this plan? Lifestyle  Cook and eat meals together with your family, when possible. Drink enough fluid to keep your urine clear or pale yellow. Be physically active every day. This includes: Aerobic exercise like running or swimming. Leisure activities like gardening, walking, or housework. Get 7-8 hours of sleep each night. If recommended by your health care provider, drink red wine in moderation. This means 1 glass a day for nonpregnant women and 2 glasses a day for men. A glass of wine equals 5 oz (150 mL). Reading food labels  Check the serving size of packaged foods. For foods such as rice and pasta, the serving size refers to the amount of cooked product, not dry. Check the total fat in packaged foods. Avoid foods that have saturated fat or trans fats. Check the ingredients list for added sugars, such as corn syrup. Shopping  At the grocery store, buy  most of your food from the areas near the walls of the store. This includes: Fresh fruits and vegetables (produce). Grains, beans, nuts, and seeds. Some of these may be available in unpackaged forms or large amounts (in bulk). Fresh seafood. Poultry and eggs. Low-fat dairy products. Buy whole ingredients instead of prepackaged foods. Buy fresh fruits and vegetables in-season from local farmers markets. Buy frozen fruits and  vegetables in resealable bags. If you do not have access to quality fresh seafood, buy precooked frozen shrimp or canned fish, such as tuna, salmon, or sardines. Buy small amounts of raw or cooked vegetables, salads, or olives from the deli or salad bar at your store. Stock your pantry so you always have certain foods on hand, such as olive oil, canned tuna, canned tomatoes, rice, pasta, and beans. Cooking  Cook foods with extra-virgin olive oil instead of using butter or other vegetable oils. Have meat as a side dish, and have vegetables or grains as your main dish. This means having meat in small portions or adding small amounts of meat to foods like pasta or stew. Use beans or vegetables instead of meat in common dishes like chili or lasagna. Experiment with different cooking methods. Try roasting or broiling vegetables instead of steaming or sauteing them. Add frozen vegetables to soups, stews, pasta, or rice. Add nuts or seeds for added healthy fat at each meal. You can add these to yogurt, salads, or vegetable dishes. Marinate fish or vegetables using olive oil, lemon juice, garlic, and fresh herbs. Meal planning  Plan to eat 1 vegetarian meal one day each week. Try to work up to 2 vegetarian meals, if possible. Eat seafood 2 or more times a week. Have healthy snacks readily available, such as: Vegetable sticks with hummus. Greek yogurt. Fruit and nut trail mix. Eat balanced meals throughout the week. This includes: Fruit: 2-3 servings a day Vegetables: 4-5 servings a day Low-fat dairy: 2 servings a day Fish, poultry, or lean meat: 1 serving a day Beans and legumes: 2 or more servings a week Nuts and seeds: 1-2 servings a day Whole grains: 6-8 servings a day Extra-virgin olive oil: 3-4 servings a day Limit red meat and sweets to only a few servings a month What are my food choices? Mediterranean diet Recommended Grains: Whole-grain pasta. Brown rice. Bulgar wheat. Polenta.  Couscous. Whole-wheat bread. [place] Madeira. Vegetables: Artichokes. Beets. Broccoli. Cabbage. Carrots. Eggplant. Green beans. Chard. Kale. Spinach. Onions. Leeks. Peas. Squash. Tomatoes. Peppers. Radishes. Fruits: Apples. Apricots. Avocado. Berries. Bananas. Cherries. Dates. Figs. Grapes. Lemons. Melon. Oranges. Peaches. Plums. Pomegranate. Meats and other protein foods: Beans. Almonds. Sunflower seeds. Pine nuts. Peanuts. Cod. Salmon. Scallops. Shrimp. Tuna. Tilapia. Clams. Oysters. Eggs. Dairy: Low-fat milk. Cheese. Greek yogurt. Beverages: Water . Red wine. Herbal tea. Fats and oils: Extra virgin olive oil. Avocado oil. Grape seed oil. Sweets and desserts: Greek yogurt with honey. Baked apples. Poached pears. Trail mix. Seasoning and other foods: Basil. Cilantro. Coriander. Cumin. Mint. Parsley. Sage. Rosemary. Tarragon. Garlic. Oregano. Thyme. Pepper. Balsalmic vinegar. Tahini. Hummus. Tomato sauce. Olives. Mushrooms. Limit these Grains: Prepackaged pasta or rice dishes. Prepackaged cereal with added sugar. Vegetables: Deep fried potatoes (french fries). Fruits: Fruit canned in syrup. Meats and other protein foods: Beef. Pork. Lamb. Poultry with skin. Hot dogs. [place]. Dairy: Ice cream. Sour cream. Whole milk. Beverages: Juice. Sugar-sweetened soft drinks. Beer. Liquor and spirits. Fats and oils: Butter. Canola oil. Vegetable oil. Beef fat (tallow). Lard. Sweets and desserts: Cookies. Cakes. Pies. Candy. Seasoning and  other foods: Mayonnaise. Premade sauces and marinades. The items listed may not be a complete list. Talk with your dietitian about what dietary choices are right for you. Summary The Mediterranean diet includes both food and lifestyle choices. Eat a variety of fresh fruits and vegetables, beans, nuts, seeds, and whole grains. Limit the amount of red meat and sweets that you eat. Talk with your health care provider about whether it is safe for you to drink red wine in  moderation. This means 1 glass a day for nonpregnant women and 2 glasses a day for men. A glass of wine equals 5 oz (150 mL). This information is not intended to replace advice given to you by your health care provider. Make sure you discuss any questions you have with your health care provider. Document Released: 05/02/2016 Document Revised: 06/04/2016 Document Reviewed: 05/02/2016 Elsevier Interactive Patient Education  2017 [employer].

## 2023-10-09 LAB — ACTH: C206 ACTH: 11 pg/mL (ref 6–50)

## 2023-10-09 LAB — IGG: IgG (Immunoglobin G), Serum: 1427 mg/dL (ref 600–1540)

## 2023-10-09 LAB — GROWTH HORMONE: Growth Hormone: 0.1 ng/mL (ref <=7.1)

## 2023-10-09 LAB — PROLACTIN: Prolactin: 6.2 ng/mL

## 2023-10-09 LAB — TSH: TSH: 1.24 m[IU]/L (ref 0.40–4.50)

## 2023-10-09 LAB — FSH/LH
FSH: 53.1 m[IU]/mL
LH: 17.9 m[IU]/mL

## 2023-10-09 LAB — COPEPTIN: Copeptin: 4.1 pmol/L (ref <=13.7)

## 2023-10-09 LAB — VITAMIN B12: Vitamin B-12: 688 pg/mL (ref 200–1100)

## 2023-10-12 MED ORDER — ROSUVASTATIN CALCIUM 5 MG PO TABS: 5.0000 mg | ORAL_TABLET | Freq: Every day | ORAL | 3 refills | Status: DC

## 2023-10-12 MED ORDER — VALSARTAN 80 MG PO TABS: 160.0000 mg | ORAL_TABLET | Freq: Every day | ORAL | Status: DC

## 2023-10-15 ENCOUNTER — Telehealth: Payer: Self-pay

## 2023-10-15 NOTE — Telephone Encounter (Signed)
I advised patient of normal labs, voiced understanding and thanked me for calling.

## 2023-10-15 NOTE — Telephone Encounter (Signed)
-----   Message from Marlowe Kays sent at 10/15/2023 10:41 AM EST ----- Yes, hormone tests are normal, thanks ----- Message ----- From: Wende Mott, LPN Sent: 4/69/6295  10:15 AM EST To: Marcos Eke, PA-C  Are her results resulted in labs so we can call her?

## 2023-10-17 ENCOUNTER — Encounter: Payer: Self-pay | Admitting: Internal Medicine

## 2023-10-21 DIAGNOSIS — I129 Hypertensive chronic kidney disease with stage 1 through stage 4 chronic kidney disease, or unspecified chronic kidney disease: Secondary | ICD-10-CM | POA: Diagnosis not present

## 2023-10-26 ENCOUNTER — Encounter: Payer: Self-pay | Admitting: Internal Medicine

## 2023-10-26 NOTE — Progress Notes (Unsigned)
      Subjective:    Patient ID: Jennifer Sanford, female    DOB: July 19, 1951, 73 y.o.   MRN: [id number]     HPI [place] is here for follow up of her chronic medical problems.  Saw her in Nov for memory concerns - saw Marlowe Kays  on 1/7.  MoCA was 27/30  Labs ordered  Medications and allergies reviewed with patient and updated if appropriate.  Current Outpatient Medications on File Prior to Visit  Medication Sig Dispense Refill   B Complex Vitamins (B COMPLEX 50) TABS daily  0   Biotin 1000 MCG tablet      Calcium Carb-Cholecalciferol (CALCIUM 600 + D PO)      Cholecalciferol (VITAMIN D3) 1000 units CAPS daily 60 capsule    citalopram (CELEXA) 20 MG tablet TAKE 1 TABLET (20 MG TOTAL) BY MOUTH EVERY MORNING. 90 tablet 3   famotidine (PEPCID) 40 MG tablet TAKE 1 TABLET TWICE DAILY 180 tablet 3   hydrochlorothiazide (HYDRODIURIL) 12.5 MG tablet Take 1 tablet (12.5 mg total) by mouth daily. 90 tablet 3   latanoprost (XALATAN) 0.005 % ophthalmic solution Place 1 drop into the left eye at bedtime.     rosuvastatin (CRESTOR) 5 MG tablet Take 1 tablet (5 mg total) by mouth daily. 90 tablet 3   VAGINAL LUBRICANT VA Place vaginally. Patient currently using GYNATROF suppositories     valsartan (DIOVAN) 80 MG tablet Take 2 tablets (160 mg total) by mouth daily.     Zinc 50 MG CAPS      No current facility-administered medications on file prior to visit.     Review of Systems     Objective:  There were no vitals filed for this visit. BP Readings from Last 3 Encounters:  09/30/23 115/65  09/04/23 138/69  08/13/23 132/78   Wt Readings from Last 3 Encounters:  09/30/23 216 lb (98 kg)  08/13/23 207 lb (93.9 kg)  04/28/23 207 lb (93.9 kg)   There is no height or weight on file to calculate BMI.    Physical Exam     Lab Results  Component Value Date   WBC 8.6 09/04/2023   HGB 12.4 09/04/2023   HCT 37.6 09/04/2023   PLT 278 09/04/2023   GLUCOSE 84 09/04/2023   CHOL 194  05/27/2023   TRIG 73.0 05/27/2023   HDL 52.30 05/27/2023   LDLCALC 128 (H) 05/27/2023   ALT 14 05/27/2023   AST 18 05/27/2023   NA 138 09/04/2023   K 3.8 09/04/2023   CL 102 09/04/2023   CREATININE 1.26 (H) 09/04/2023   BUN 27 (H) 09/04/2023   CO2 29 09/04/2023   TSH 1.24 09/30/2023   INR 2.44 (H) 04/02/2013   HGBA1C 6.0 05/27/2023     Assessment & Plan:    See Problem List for Assessment and Plan of chronic medical problems.

## 2023-10-27 ENCOUNTER — Ambulatory Visit (INDEPENDENT_AMBULATORY_CARE_PROVIDER_SITE_OTHER): Payer: Medicare HMO | Admitting: Internal Medicine

## 2023-10-27 VITALS — BP 116/78 | HR 79 | Temp 97.8°F | Ht 70.0 in | Wt 206.0 lb

## 2023-10-27 DIAGNOSIS — I679 Cerebrovascular disease, unspecified: Secondary | ICD-10-CM | POA: Diagnosis not present

## 2023-10-27 DIAGNOSIS — F419 Anxiety disorder, unspecified: Secondary | ICD-10-CM | POA: Diagnosis not present

## 2023-10-27 DIAGNOSIS — N1831 Chronic kidney disease, stage 3a: Secondary | ICD-10-CM

## 2023-10-27 DIAGNOSIS — R7303 Prediabetes: Secondary | ICD-10-CM | POA: Diagnosis not present

## 2023-10-27 DIAGNOSIS — E782 Mixed hyperlipidemia: Secondary | ICD-10-CM | POA: Diagnosis not present

## 2023-10-27 DIAGNOSIS — I10 Essential (primary) hypertension: Secondary | ICD-10-CM | POA: Diagnosis not present

## 2023-10-27 DIAGNOSIS — K219 Gastro-esophageal reflux disease without esophagitis: Secondary | ICD-10-CM | POA: Diagnosis not present

## 2023-10-27 DIAGNOSIS — F3289 Other specified depressive episodes: Secondary | ICD-10-CM | POA: Diagnosis not present

## 2023-10-27 MED ORDER — VALSARTAN 160 MG PO TABS: 160.0000 mg | ORAL_TABLET | Freq: Every day | ORAL | 3 refills | Status: AC

## 2023-10-27 MED ORDER — HYDROCHLOROTHIAZIDE 12.5 MG PO TABS: 6.2500 mg | ORAL_TABLET | Freq: Every day | ORAL | Status: DC

## 2023-10-27 NOTE — Assessment & Plan Note (Addendum)
Chronic Blood pressure well controlled but still getting lows at home CMP Currently taking hydrochlorothiazide 6.25 mg daily, valsartan 160 mg daily Stop hydrochlorothiazide Continue valsartan 160 mg daily She will monitor her blood pressure at home

## 2023-10-27 NOTE — Assessment & Plan Note (Signed)
Chronic Lab Results  Component Value Date   HGBA1C 6.0 05/27/2023   Check a1c Low sugar / carb diet Stressed regular exercise

## 2023-10-27 NOTE — Assessment & Plan Note (Addendum)
Chronic Following with nephrology-Dr. Arrie Aran Continue increased water intake, avoiding NSAIDs Blood pressure not ideally controlled-low at times Hydrochlorothiazide dose recently decreased, but still having low BP at times Stop hydrochlorothiazide Continue valsartan 160 mg daily She will monitor her blood pressure closely at home Cmp, vitamin d level

## 2023-10-27 NOTE — Assessment & Plan Note (Addendum)
Chronic Regular exercise and healthy diet encouraged Encouraged weight loss, which she is working on Check lipid panel, cmp  Continue crestor 5 mg daily-just started 1 week ago so she will get all of her blood work done in approximately 5 weeks

## 2023-10-27 NOTE — Assessment & Plan Note (Signed)
Chronic Controlled, Stable Continue Celexa 20 mg daily

## 2023-10-27 NOTE — Assessment & Plan Note (Signed)
Chronic Started on Crestor 5 mg daily Recheck lipid panel in about 6 weeks after starting Continue healthy diet, regular exercise

## 2023-10-27 NOTE — Assessment & Plan Note (Signed)
Chronic Overall controlled Continue citalopram 20 mg daily

## 2023-10-27 NOTE — Patient Instructions (Addendum)
      Blood work was ordered.   Have this done in 5 - 8 weeks    Medications changes include :   stop hydrochlorothiazide and monitor BP      Return in about 6 months (around 04/25/2024) for Physical Exam.

## 2023-10-27 NOTE — Assessment & Plan Note (Signed)
Chronic GERD controlled Continue Pepcid 40 mg twice daily No longer on pantoprazole GERD diet

## 2023-11-05 DIAGNOSIS — I129 Hypertensive chronic kidney disease with stage 1 through stage 4 chronic kidney disease, or unspecified chronic kidney disease: Secondary | ICD-10-CM | POA: Diagnosis not present

## 2023-11-05 DIAGNOSIS — N1831 Chronic kidney disease, stage 3a: Secondary | ICD-10-CM | POA: Diagnosis not present

## 2023-11-05 DIAGNOSIS — K219 Gastro-esophageal reflux disease without esophagitis: Secondary | ICD-10-CM | POA: Diagnosis not present

## 2023-11-05 DIAGNOSIS — Z9889 Other specified postprocedural states: Secondary | ICD-10-CM | POA: Diagnosis not present

## 2023-11-05 DIAGNOSIS — R7303 Prediabetes: Secondary | ICD-10-CM | POA: Diagnosis not present

## 2023-11-11 DIAGNOSIS — I129 Hypertensive chronic kidney disease with stage 1 through stage 4 chronic kidney disease, or unspecified chronic kidney disease: Secondary | ICD-10-CM | POA: Diagnosis not present

## 2023-11-14 DIAGNOSIS — H43393 Other vitreous opacities, bilateral: Secondary | ICD-10-CM | POA: Diagnosis not present

## 2023-12-03 ENCOUNTER — Encounter: Payer: Self-pay | Admitting: Internal Medicine

## 2023-12-03 MED ORDER — CITALOPRAM HYDROBROMIDE 20 MG PO TABS: 20.0000 mg | ORAL_TABLET | Freq: Every morning | ORAL | 1 refills | Status: DC

## 2023-12-12 ENCOUNTER — Other Ambulatory Visit (INDEPENDENT_AMBULATORY_CARE_PROVIDER_SITE_OTHER)

## 2023-12-12 DIAGNOSIS — I10 Essential (primary) hypertension: Secondary | ICD-10-CM | POA: Diagnosis not present

## 2023-12-12 DIAGNOSIS — R7303 Prediabetes: Secondary | ICD-10-CM | POA: Diagnosis not present

## 2023-12-12 DIAGNOSIS — E782 Mixed hyperlipidemia: Secondary | ICD-10-CM | POA: Diagnosis not present

## 2023-12-12 DIAGNOSIS — N1831 Chronic kidney disease, stage 3a: Secondary | ICD-10-CM | POA: Diagnosis not present

## 2023-12-12 LAB — LIPID PANEL
Cholesterol: 180 mg/dL (ref 0–200)
HDL: 63.9 mg/dL (ref >39.00)
LDL Cholesterol: 97 mg/dL (ref 0–99)
NonHDL: 115.69
Total CHOL/HDL Ratio: 3
Triglycerides: 92 mg/dL (ref 0.0–149.0)
VLDL: 18.4 mg/dL (ref 0.0–40.0)

## 2023-12-12 LAB — COMPREHENSIVE METABOLIC PANEL
ALT: 15 U/L (ref 0–35)
AST: 22 U/L (ref 0–37)
Albumin: 4.2 g/dL (ref 3.5–5.2)
Alkaline Phosphatase: 74 U/L (ref 39–117)
BUN: 22 mg/dL (ref 6–23)
CO2: 30 meq/L (ref 19–32)
Calcium: 9.8 mg/dL (ref 8.4–10.5)
Chloride: 103 meq/L (ref 96–112)
Creatinine, Ser: 0.88 mg/dL (ref 0.40–1.20)
GFR: 66.57 mL/min (ref >60.00)
Glucose, Bld: 81 mg/dL (ref 70–99)
Potassium: 4.3 meq/L (ref 3.5–5.1)
Sodium: 141 meq/L (ref 135–145)
Total Bilirubin: 0.5 mg/dL (ref 0.2–1.2)
Total Protein: 7.6 g/dL (ref 6.0–8.3)

## 2023-12-12 LAB — VITAMIN D 25 HYDROXY (VIT D DEFICIENCY, FRACTURES): VITD: 44.76 ng/mL (ref 30.00–100.00)

## 2023-12-12 LAB — HEMOGLOBIN A1C: Hgb A1c MFr Bld: 5.7 % (ref 4.6–6.5)

## 2023-12-14 ENCOUNTER — Encounter: Payer: Self-pay | Admitting: Internal Medicine

## 2023-12-14 DIAGNOSIS — M25512 Pain in left shoulder: Secondary | ICD-10-CM | POA: Diagnosis not present

## 2023-12-16 ENCOUNTER — Ambulatory Visit: Payer: Medicare HMO | Admitting: Psychology

## 2023-12-16 ENCOUNTER — Ambulatory Visit: Payer: Self-pay | Admitting: Psychology

## 2023-12-16 DIAGNOSIS — R4189 Other symptoms and signs involving cognitive functions and awareness: Secondary | ICD-10-CM

## 2023-12-16 NOTE — Progress Notes (Signed)
 NEUROPSYCHOLOGICAL EVALUATION [place]  [place] Department of Neurology  Date of Evaluation: 12/16/2023  REASON FOR REFERRAL   Jennifer Sanford is a 73 year old, right-handed, Black female with 16 years of formal education. She was referred for neuropsychological evaluation by Marlowe Kays, PA-C, to assess current neurocognitive functioning, document potential cognitive deficits, and assist with treatment planning. She previously underwent neuropsychological evaluation with Koleen Distance, Psy.D., on 04/16/2016. Results from that evaluation were used as a baseline for comparison.  SUMMARY OF RESULTS   Premorbid cognitive abilities are estimated to be in the high average range based on word reading and sociodemographic information. On assessment today, the patient demonstrated a relative weakness in aspects of attention/working memory. Performance was otherwise intact on all remaining measures of processing speed, executive functioning, language, visuospatial skills, learning/memory, and fine motor dexterity. On self-report questionnaires, she did not endorse clinically-elevated levels of depression or anxiety. Compared to her 2017 evaluation, performance was broadly stable, aside from slightly weaker attention/working memory on select tasks.  DIAGNOSTIC IMPRESSION   Results of the current evaluation indicated generally normal and stable cognitive performance, with the exception of mildly reduced attention/working memory. Patient remains functionally independent. She does not show evidence of a neurocognitive disorder at this time. 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 While attention was not globally impaired on testing, it is important to highlight  that our testing environment is highly structured, which differs from real-world conditions. Personal factors, such as situational depression, can often act as internal distractions in real-world settings, potentially worsening already existing attention problems. As suggested in the prior evaluation, implementing strategies to enhance attention and working memory in daily life may help improve subjective cognitive concerns. Further, it is encouraging that the patient was still working until last month without any reported difficulties and is now interested in returning to work in a part-time capacity.  ICD-10 Codes: R41.89 Cognitive concerns  RECOMMENDATIONS   In consultation with your doctor, schedule cognitive reevaluation on an as-needed basis to assess for cognitive decline and update treatment recommendations.   While the patient is not currently reporting elevated depression, she should continue to monitor her mood given her history of situational/seasonal depression. She is already taking medication and attending therapy as needed. If mood symptoms begin to interfere with daily functioning, she is encouraged to increase her participation in therapy and discuss medication with her prescribing provider.  Patient is encouraged to continue managing vascular risk factors through a heart healthy diet, physician-approved physical activity, and medication adherence.   Continue to participate in activities that you find enjoyable and fulfilling, whether that be hobbies, socializing with loved ones, or being outdoors. This can improve mood, increase motivation, and offer cognitive stimulation.  Consider implementing compensatory strategies to address difficulties with attention. For example:    -Avoid external distractions when needing to concentrate   -Limit exposure to fast paced environments with multiple sensory demands   -Write down complicated information and use checklists   -Attempt and  complete one task at a time (i.e., no multi-tasking)   -Verbalize aloud each step of a task to maintain focus   -Take frequent breaks during the completion of steps/tasks to avoid fatigue   -Reduce the amount of information considered at one time   -Scheduling more difficult activities for a time of day  where you are usually most alert  DISPOSITION   Patient will follow up with the referring provider, Ms. Wertman. No follow-up neuropsychological testing was scheduled at this time. Please feel free to refer the patient for repeated evaluation if she shows a significant change in neurocognitive status. She will be provided verbal feedback in approximately one week regarding the findings and impression during this visit.  The remainder of the report includes the details of the patient's background and a table of results from the current evaluation, which support the summary and recommendations described above.  BACKGROUND   History of Presenting Illness: The following information was obtained from a review of medical records and an interview with the patient. Patient has been established with our neurology department since 2017 for management of cognitive concerns. During her last visit with Marlowe Kays, PA-C, on 09/30/2023, she continued to report difficulties with attention and short-term memory. MoCA = 27/30. She has been referred for neuropsychological re-evaluation to determine if there has been a decline in cognition since prior evaluation in 2017.  Previous Neuropsychological Evaluation with Koleen Distance, Psy.D. (04/16/2016): "Patient's performances on testing were largely within normal limits. She did not demonstrate memory dysfunction, semantic retrieval deficits or impaired visual-spatial perception. She did display variable working memory, and there were indicators of impulsivity and/or difficulty with mental flexibility on Trails B. However, her test results do not warrant a  diagnosis of a cognitive disorder at this time. These areas of relative weakness may reflect longstanding weaknesses. The patient denied symptoms of clinically significant depression or anxiety at this time, but psychosocial stress could be contributing to subjective cognitive complaints. Behavioral strategies for enhancing attention/concentration and working memory in daily life may be helpful."  Cognitive Functioning: During today's appointment, the patient reported seeking neuropsychological reevaluation at her daughters' request. She denied major cognitive complaints at this time but noted longstanding attentional difficulties, which have recently raised her daughters' concerns. For example, she has exited the car without putting it in park. She also acknowledges misplacing items due to being "absent-minded" and starting multiple tasks but getting distracted before finishing any one task. Soon after turning 73 years old, subjective cognitive concerns increased; however, she now attributes these worries to adjusting to her age. She reports that cognition has been generally stable since initial concerns in 2017. She occasionally experiences slight short-term memory and word-finding difficulties but otherwise denies major concerns with processing speed, navigation, and executive functioning.   Physical Functioning: Patient denied difficulties with sleep initiation and maintenance. Appetite is stable. No changes to sense of taste or smell were reported. Vision and hearing are stable. She denied tremor. She is more mindful when walking and has not had any recent falls.  Emotional Functioning: Patient reported her current mood as "very good" and "upbeat." She denied suicidal ideation. She remains active by working part time, going to the gym 3-4 times per week, and participating in church activities.  Imaging: MRI of the brain (09/08/2023) documented mild generalized parenchymal volume loss and mild to  moderate chronic microvascular ischemic changes.  Other Relevant Medical History: Remarkable for hypertension, hyperlipidemia, prediabetes, chronic kidney disease stage III, GERD, carpal tunnel syndrome, and osteoarthritis of knee. She endorsed a history of multiple concussions but denied loss of consciousness or persistent cognitive sequelae following any of these events. No history of stroke, CNS infection, or seizure was reported.  Current Medications: Per patient, citalopram, famotidine, hydrochlorothiazide, latanoprost solution, rosuvastatin, and valsartan. She also takes B complex vitamins, biotin, calcium carbonate-cholecalciferol,  cholecalciferol, and zinc.  Functional Status: Patient independently performs all ADLs and IADLs, including driving, managing finances, and managing medications.  Family Neurological History: Remarkable for dementia (mother).  Psychiatric History: Remarkable for seasonal/situational depression. Mood symptoms are managed with medication, and the patient attends counseling as needed. History of suicidal ideation, hallucinations, and psychiatric hospitalizations was not reported.  Substance Use History: Patient reported infrequent alcohol consumption and denied current use of nicotine, marijuana, and illicit substances.  Social and Developmental History: Patient was born in [place], [place]. History of perinatal complications and developmental delays was not reported. She is divorced and has three daughters. She lives alone.  Educational and Occupational History: No history of childhood learning disability, special education services, or grade retention was reported, although the patient suspects she may have had attention difficulties since childhood. 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 She is now seeking a new part-time job.  BEHAVIORAL OBSERVATIONS   Patient arrived on time and was unaccompanied. She ambulated independently and without gait disturbance. She was alert and fully oriented. She was appropriately groomed and dressed for the setting. No significant sensory or motor abnormalities were observed. Vision and hearing were adequate for testing purposes. Speech was of normal rate, prosody, and volume. No conversational word-finding difficulties, paraphasic errors, or dysarthria were observed. Comprehension was conversationally intact. Thought processes were linear, logical, and coherent. Thought content was organized and devoid of delusions. Insight appeared intact. Affect was even and congruent with euthymic mood. She was cooperative and gave adequate effort during testing, including on standalone and embedded measures of performance validity. Results are thought to accurately reflect her cognitive functioning at this time.  NEUROPSYCHOLOGICAL TESTING RESULTS   Tests Administered: Animal Naming Test; Beck Anxiety Inventory (BAI); Beck Depression Inventory II (BDI-II); Brief Visuospatial Memory Test-Revised (BVMT-R) - Form 1; [employer] Learning Test Third Edition (CVLT3) - Standard Form; Controlled Oral Word Association Test (COWAT): FAS; Delis-Kaplan Air cabin crew System (D-KEFS) - Subtest(s): Color-Word Interference Test; Grooved Pegboard Test; Judgment of Line Orientation (JLO) - Form H; Neuropsychological Assessment Battery (NAB) - Subtest(s): Naming Form 1; Standalone performance validity test (PVT); Trail Making Test (TMT); Wechsler Adult Intelligence Scale Fourth Edition (WAIS-IV) - Subtest(s): Clinical cytogeneticist, Matrix Reasoning, Similarities, Digit Span, Symbol Search, Coding; and Wechsler Memory Scale Fourth Edition  (WMS-IV) - Subtest(s): Logical Memory (LM).  Test results are provided in the table below. Whenever possible, the patient's scores were compared against age-, sex-, and education-corrected normative samples. Interpretive descriptions are based on the AACN consensus conference statement on uniform labeling (Guilmette et al., 2020).   04/16/2016 12/16/2023  PREMORBID FUNCTIONING  RAW  RANGE  TOPF StdS=112 -- -- (High Average)  ATTENTION & WORKING MEMORY  RAW  RANGE  WAIS-IV Digit Span ss=9 -- ss=7 Low Average  Forward -- -- ss=14 High Average  Backward -- -- ss=5 Below Average  Sequencing -- -- ss=4 Below Average  PROCESSING SPEED  RAW  RANGE  Trails A T=52 38''0e T=52 Average  WAIS-IV Symbol Search ss=8 -- ss=6 Low Average  WAIS-IV Coding  ss=11 -- ss=11 Average  DKEFS CWIT Color Naming -- 28''0e ss=12 High Average  DKEFS CWIT Word Reading -- 19''0e  ss=13 High Average  EXECUTIVE FUNCTION  RAW  RANGE  Trails B T=48 94''3e T=52 Average  WAIS-IV Matrix Reasoning ss=6 -- ss=8 Average  WAIS-IV Similarities ss=14 -- ss=12 High Average  COWAT Letter Fluency T=65 19+19+18 T=68 Above Average  DKEFS CWIT Inhibition -- 68''0e ss=11 Average  DKEFS CWIT Inhibition/Switching -- 65''1e ss=12 High Average  LANGUAGE  RAW  RANGE  COWAT Letter Fluency T=65 19+19+18 T=68 Above Average  Animal Naming Test T=61 20 T=58 High Average  NAB Naming Test T=55 31/31 T=58 WNL  VISUOSPATIAL  RAW  RANGE  WAIS-IV Block Design ss=7 -- ss=8 Average  JLO -- C/S=21/30 22%ile Low Average  BVMT-R Copy Trial -- 12/12 -- WNL  VERBAL LEARNING & MEMORY CVLT-II CVLT3  RANGE  CVLT-II/CVLT3 Total 1-5 T=65 7,9,11,10,10 StdS=110 High Average  CVLT-II/CVLT3 Trial B -- 7/16 ss=14 High Average  CVLT-II/CVLT3 SDFR  z=0.0 8/16 ss=10 Average  CVLT-II/CVLT3 SDCR z=0.0 11/16 ss=11 Average  CVLT-II/CVLT3 LDFR  z=0.0 9/16 ss= 10 Average  CVLT-II/CVLT3 LDCR  z=0.0 11/16 ss= 11 Average  CVLT-II/CVLT3 Recognition Hits 13 11 ss=6 Low  Average  CVLT-II/CVLT3 Recognition False+ 0 0 ss=14 High Average  CVLT-II/CVLT3 Intrusions -- 11 ss=7 Low Average  CVLT-II/CVLT3 Repetitions -- 14 ss=6 Low Average  CVLT-II/CVLT3 Forced Choice 16/16 16/16 -- WNL  WMS-IV LM-I  ss=10 (7+13+14)/53 ss=11 Average  WMS-IV LM-II  ss=11 (8+12)/39 ss=11 Average  WMS-IV LM Recognition  26-50%ile (8+14)/23 >75%ile High Average to Exceptionally High  VISUOSPATIAL LEARNING & MEMORY  RAW  RANGE  BVMT-R Total Recall -- (3+7+9)/36 T=46 Average  BVMT-R Delayed Recall -- 6/12 T=41 Low Average  BVMT-R Percent Retained -- 67 6-10%ile Below Average to Low Average  BVMT-R Recognition Hits -- 5 >16%ile WNL  BVMT-R Recognition False Alarms -- 0 >16%ile WNL  BVMT-R Recognition Discrimination Index -- 5 >16%ile WNL  FINE MOTOR DEXTERITY  RAW  RANGE  Grooved Pegboard (Dominant Hand) -- 100''0d T=45 Average  Grooved Pegboard (Non-Dominant Hand) -- 110''1d T=47 Average  QUESTIONNAIRES  RAW  RANGE  BDI 1 0 -- Minimal  BAI -- 4 -- Minimal  *Note: ss = scaled score; StdS = standard score; T = t-score; C/S = corrected raw score; WNL = within normal limits; BNL= below normal limits; D/C = discontinued. Scores from skewed distributions are typically interpreted as WNL (>=16th %ile) or BNL (<16th %ile).   INFORMED CONSENT   Patient was provided with a verbal description of the nature and purpose of the neuropsychological evaluation. Also reviewed were the foreseeable risks and/or discomforts and benefits of the procedure, limits of confidentiality, and mandatory reporting requirements of this provider. Patient was given the opportunity to have their questions answered. Oral consent to participate was provided by the patient.   This report was prepared as part of a clinical evaluation and is not intended for forensic use.  SERVICE   This evaluation was conducted by Annice Pih, Psy.D. In addition to time spent directly with the patient, total professional time includes  record review, integration of relevant medical history, test selection, interpretation of findings, and report preparation. A technician, Wallace Keller, B.S., provided testing and scoring assistance for 145 minutes.  Psychiatric Diagnostic Evaluation Services (Professional): [id number] x 1 Neuropsychological Testing Evaluation Services (Professional): [id number] x 1 Neuropsychological Testing Evaluation Services (Professional): [id number] x 2 Neuropsychological Test Administration and Scoring (Technician): [phone] x 1 Neuropsychological Test Administration and Scoring (Technician): [phone] x 4  This report was generated using voice recognition software. While this document has been carefully  reviewed, transcription errors may be present. I apologize in advance for any inconvenience. Please contact me if further clarification is needed.            Annice Pih, Psy.D.             Neuropsychologist

## 2023-12-16 NOTE — Progress Notes (Signed)
   Psychometrician Note   Cognitive testing was administered to Ivette Loyal by Wallace Keller, B.S. (psychometrist) under the supervision of Dr. Annice Pih, Psy.D., licensed psychologist on 12/16/2023. Ms. Straub did not appear overtly distressed by the testing session per behavioral observation or responses across self-report questionnaires. Rest breaks were offered.    The battery of tests administered was selected by Dr. Annice Pih, Psy.D. with consideration to Ms. Lunde's current level of functioning, the nature of her symptoms, emotional and behavioral responses during interview, level of literacy, observed level of motivation/effort, and the nature of the referral question. This battery was communicated to the psychometrist. Communication between Dr. Annice Pih, Psy.D. and the psychometrist was ongoing throughout the evaluation and Dr. Annice Pih, Psy.D. was immediately accessible at all times. Dr. Annice Pih, Psy.D. provided supervision to the psychometrist on the date of this service to the extent necessary to assure the quality of all services provided.    Ivette Loyal will return within approximately 1-2 weeks for an interactive feedback session with Dr. Robbie Lis at which time her test performances, clinical impressions, and treatment recommendations will be reviewed in detail. Ms. Nakama understands she can contact our office should she require our assistance before this time.  A total of 145 minutes of billable time were spent face-to-face with Ms. Rawlinson by the psychometrist. This includes both test administration and scoring time. Billing for these services is reflected in the clinical report generated by Dr. Annice Pih, Psy.D.  This note reflects time spent with the psychometrician and does not include test scores or any clinical interpretations made by Dr. Robbie Lis. The full report will follow in a separate note.

## 2023-12-17 DIAGNOSIS — H40029 Open angle with borderline findings, high risk, unspecified eye: Secondary | ICD-10-CM | POA: Diagnosis not present

## 2023-12-23 ENCOUNTER — Ambulatory Visit: Payer: Medicare HMO | Admitting: Psychology

## 2023-12-23 DIAGNOSIS — R4189 Other symptoms and signs involving cognitive functions and awareness: Secondary | ICD-10-CM

## 2023-12-23 NOTE — Progress Notes (Signed)
   NEUROPSYCHOLOGY FEEDBACK SESSION Jennifer Sanford. [facility]  [place] Department of Neurology  Date of Feedback Session: 12/23/2023  REASON FOR REFERRAL   [name] is a 73 year old, right-handed, Black female with 16 years of formal education. She was referred for neuropsychological evaluation by Marlowe Kays, PA-C, to assess current neurocognitive functioning, document potential cognitive deficits, and assist with treatment planning. She previously underwent neuropsychological evaluation with Koleen Distance, Psy.D., on 04/16/2016. Results from that evaluation were used as a baseline for comparison.  FEEDBACK   Patient completed a comprehensive neuropsychological evaluation on 12/16/2023. Please refer to that encounter for the full report and recommendations. Briefly, results indicated generally normal and stable cognitive performance, with the exception of mildly reduced attention/working memory. Patient remains functionally independent. She does not show evidence of a neurocognitive disorder at this time. 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 While attention was not globally impaired on testing, it is important to highlight that our testing environment is highly structured, which differs from real-world conditions. Personal factors, such as situational depression, can often act as internal distractions in real-world settings, potentially worsening already existing attention problems. As suggested in the prior evaluation, implementing strategies to enhance attention and working memory in daily life may help improve subjective cognitive concerns. Further, it is encouraging that the patient was still working until last month without any reported difficulties and is  now interested in returning to work in a part-time capacity.  Today, the patient was unaccompanied. She was provided verbal feedback regarding the findings and impression during this visit, and her questions were answered. A copy of the report was provided at the conclusion of the visit.  DISPOSITION   Patient will follow up with the referring provider, Ms. Wertman. No follow-up neuropsychological testing was scheduled at this time. Please feel free to refer the patient for repeated evaluation if she shows a significant change in neurocognitive status.  SERVICE   This feedback session was conducted by Annice Pih, Psy.D. [place] unit [place] [id number] was billed for Dr. Robbie Lis' time spent in preparing, conducting, and documenting the current feedback session.  This report was generated using voice recognition software. While this document has been carefully reviewed, transcription errors may be present. I apologize in advance for any inconvenience. Please contact me if further clarification is needed.

## 2023-12-30 ENCOUNTER — Ambulatory Visit: Payer: Medicare HMO | Admitting: Physician Assistant

## 2024-01-23 ENCOUNTER — Ambulatory Visit: Admitting: Physician Assistant

## 2024-02-05 ENCOUNTER — Telehealth: Payer: Self-pay | Admitting: Internal Medicine

## 2024-02-05 NOTE — Telephone Encounter (Signed)
 Copied from CRM [id number]. Topic: Referral - Question >> Feb 05, 2024  9:48 AM [employer]ene [employer] wrote: Reason for CRM: Patient stated that she's having issues in her shoulders and neck and would like to get an order for an MRI and a referral to neurosurgeon Dr. Larrie Po. She has an appointment scheduled on 02/10/24 if needed but she would like call back to check if she can get referral without appointment.

## 2024-02-05 NOTE — Telephone Encounter (Signed)
 Spoke with patient and advised her to keep appointment.

## 2024-02-09 NOTE — Progress Notes (Signed)
 Subjective:    Patient ID: Jennifer Sanford, female    DOB: 1951/01/22, 73 y.o.   MRN: [id number]      HPI Erminie is here for  Chief Complaint  Patient presents with   Referral    Patient would like to be referred to a neuro-surgeon and would also like to have MRI C spine done (having pain in neck, radiating down into both shoulders with some numbness noted)    Neck and shoulder pain -she started having neck and shoulder pain on 5/8.  She had neck pain that was worse with any movement.  The pain would radiate up to the head on her left side and radiate down to the upper back.  She also had pain down the left arm or the right arm.  It would never occur at the same time, but different times.  She did have some intermittent tingling in her fingers, but denied any weakness.   She did have her left shoulder lock up and ended up seeing someone for that and had an injection in the shoulder.  They believed all of her symptoms were related to her neck.  She was given a steroid taper and advised to follow-up with me.  Her symptoms have improved.  Her neck feels pretty good now, but she will still have pain with lots of turning.  She is not having any arm pain at this time   Took prednisone  last week and that took the edge off.  40 mg first day -> 5 mg  She wanted to see about getting an MRI and referral to neurosurgery.   Previous imaging  CT c-spine 05/2022  MRI C-spine 02/2021 -  1. 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 2. Severe right neural foraminal stenosis at C5-6. 3. Moderate neural foraminal stenosis bilaterally at C4-5 and on the left at C6-7.       Medications and allergies reviewed with patient and updated if appropriate.  Current Outpatient Medications on File Prior to Visit  Medication Sig Dispense Refill   B Complex Vitamins (B COMPLEX 50) TABS daily  0   Biotin 1000 MCG tablet      Calcium   Carb-Cholecalciferol (CALCIUM  600 + D PO)      Cholecalciferol (VITAMIN D3) 1000 units CAPS daily 60 capsule    citalopram  (CELEXA ) 20 MG tablet Take 1 tablet (20 mg total) by mouth every morning. 90 tablet 1   famotidine  (PEPCID ) 40 MG tablet TAKE 1 TABLET TWICE DAILY 180 tablet 3   latanoprost (XALATAN) 0.005 % ophthalmic solution Place 1 drop into the left eye at bedtime.     rosuvastatin  (CRESTOR ) 5 MG tablet Take 1 tablet (5 mg total) by mouth daily. 90 tablet 3   VAGINAL LUBRICANT VA Place vaginally. Patient currently using GYNATROF suppositories     valsartan  (DIOVAN ) 160 MG tablet Take 1 tablet (160 mg total) by mouth daily. 90 tablet 3   Zinc 50 MG CAPS      baclofen  (LIORESAL ) 10 MG tablet TAKE 1 TABLET 3 TIMES A DAY BY ORAL ROUTE.     No current facility-administered medications on file prior to visit.    Review of Systems     Objective:   Vitals:   02/10/24 1313  BP: 132/84  Pulse: 62  Temp: 98.2 F (36.8 C)  SpO2: 97%   BP Readings from Last 3 Encounters:  02/10/24 132/84  10/27/23 116/78  09/30/23 115/65  Wt Readings from Last 3 Encounters:  02/10/24 207 lb (93.9 kg)  10/27/23 206 lb (93.4 kg)  09/30/23 216 lb (98 kg)   Body mass index is 29.7 kg/m.    Physical Exam Constitutional:      General: She is not in acute distress.    Appearance: Normal appearance. She is not ill-appearing.  HENT:     Head: Normocephalic and atraumatic.  Musculoskeletal:        General: No swelling, tenderness or deformity.     Comments: Slightly decreased range of motion of neck-she does have pain in the neck with extremes turning the head  Skin:    General: Skin is warm and dry.     Findings: No rash.  Neurological:     Mental Status: She is alert.     Sensory: No sensory deficit (Normal sensation in bilateral upper extremities).     Motor: No weakness (No weakness bilateral upper extremities).            Assessment & Plan:    See Problem List for  Assessment and Plan of chronic medical problems.

## 2024-02-10 ENCOUNTER — Ambulatory Visit (INDEPENDENT_AMBULATORY_CARE_PROVIDER_SITE_OTHER): Admitting: Internal Medicine

## 2024-02-10 ENCOUNTER — Encounter: Payer: Self-pay | Admitting: Internal Medicine

## 2024-02-10 VITALS — BP 132/84 | HR 62 | Temp 98.2°F | Ht 70.0 in | Wt 207.0 lb

## 2024-02-10 DIAGNOSIS — M5412 Radiculopathy, cervical region: Secondary | ICD-10-CM | POA: Diagnosis not present

## 2024-02-10 DIAGNOSIS — I10 Essential (primary) hypertension: Secondary | ICD-10-CM | POA: Diagnosis not present

## 2024-02-10 NOTE — Assessment & Plan Note (Signed)
 Acute Experiencing neck pain and cervical radiculopathy Has pain radiating up to her head on the left side and into the upper back Has had left arm pain or right arm pain-they are not hurting at same time Intermittent tingling in her hands, but no weakness Distribution in the right arm was C8 8 and distribution in the left arm was C6.   Symptoms have improved with steroid taper and are minimal at this time so we will hold off on further imaging and referral If symptoms recur would consider another steroid taper She knows that physical therapy exercises to do and can do those at home If symptoms continue to recur will order MRI and referred for neurosurgery

## 2024-02-10 NOTE — Assessment & Plan Note (Signed)
 Chronic Blood pressure adequately controlled Continue valsartan  160 mg daily

## 2024-02-10 NOTE — Patient Instructions (Addendum)
 Return if symptoms worsen or fail to improve.

## 2024-02-11 ENCOUNTER — Other Ambulatory Visit: Payer: Self-pay | Admitting: Internal Medicine

## 2024-02-26 ENCOUNTER — Encounter (INDEPENDENT_AMBULATORY_CARE_PROVIDER_SITE_OTHER): Payer: Self-pay | Admitting: Internal Medicine

## 2024-02-26 DIAGNOSIS — M5412 Radiculopathy, cervical region: Secondary | ICD-10-CM

## 2024-02-27 DIAGNOSIS — M5412 Radiculopathy, cervical region: Secondary | ICD-10-CM

## 2024-02-27 MED ORDER — PREDNISONE 5 MG PO TABS: ORAL_TABLET | ORAL | 0 refills | Status: AC

## 2024-02-27 NOTE — Telephone Encounter (Signed)

## 2024-02-28 NOTE — Addendum Note (Signed)
 Addended by: Colene Dauphin on: 02/28/2024 01:18 PM   Modules accepted: Orders

## 2024-03-24 ENCOUNTER — Ambulatory Visit

## 2024-03-29 ENCOUNTER — Ambulatory Visit

## 2024-04-02 ENCOUNTER — Ambulatory Visit: Admitting: Physical Therapy

## 2024-04-02 ENCOUNTER — Encounter: Payer: Self-pay | Admitting: Physical Therapy

## 2024-04-02 DIAGNOSIS — M6281 Muscle weakness (generalized): Secondary | ICD-10-CM

## 2024-04-02 NOTE — Therapy (Signed)
 Patient Name: Jennifer Sanford MRN: [id number] DOB:1950/12/03, 73 y.o., female Today's Date: 04/02/2024   PT End of Session - 04/02/24 0803     Visit Number 1    Authorization Type [employer]    PT Start Time 0801    PT Stop Time 0810    PT Time Calculation (min) 9 min    Activity Tolerance Patient tolerated treatment well    Behavior During Therapy [facility] for tasks assessed/performed          Pt presents to cervical evaluation alone. States she is not having any pain currently and has a good exercise regimen (swimming, yoga, stretches). Pt states she had a bad flare up of neck pain a few months ago and has known cervical disc issues. When she has pain, it peripheralizes to her head and down her left arm and her arm locks up. Pt states she received a steroid injection at [facility] a few weeks ago and that has completely fixed her pain. Pt reports she knows which movements to avoid to reduce risk of causing pain and does not need PT at this time but wanted to meet therapist in case her pain returns in the future. Advised pt to call Dr. Geofm if her pain returns to obtain new PT referral and she can return to clinic then. Pt verbalized appreciation and understanding.    Enda Santo E Kihanna Kamiya, PT, DPT 04/02/2024, 8:12 AM

## 2024-04-22 ENCOUNTER — Ambulatory Visit

## 2024-04-22 VITALS — Ht 70.0 in | Wt 207.0 lb

## 2024-04-22 DIAGNOSIS — Z780 Asymptomatic menopausal state: Secondary | ICD-10-CM

## 2024-04-22 DIAGNOSIS — Z0000 Encounter for general adult medical examination without abnormal findings: Secondary | ICD-10-CM

## 2024-04-22 NOTE — Progress Notes (Signed)
 Subjective:  Please attest and cosign this visit due to patients primary care provider not being in the office at the time the visit was completed.     Jennifer Sanford is a 73 y.o. who presents for a Medicare Wellness preventive visit.  As a reminder, Annual Wellness Visits don't include a physical exam, and some assessments may be limited, especially if this visit is performed virtually. We may recommend an in-person follow-up visit with your provider if needed.  Visit Complete: Virtual I connected with  Jennifer Sanford on 04/22/24 by a audio enabled telemedicine application and verified that I am speaking with the correct person using two identifiers.  Patient Location: Home  Provider Location: Office/Clinic  I discussed the limitations of evaluation and management by telemedicine. The patient expressed understanding and agreed to proceed.  Vital Signs: Because this visit was a virtual/telehealth visit, some criteria may be missing or patient reported. Any vitals not documented were not able to be obtained and vitals that have been documented are patient reported.  VideoDeclined- This patient declined Librarian, academic. Therefore the visit was completed with audio only.  Persons Participating in Visit: Patient.  AWV Questionnaire: No: Patient Medicare AWV questionnaire was not completed prior to this visit.  Cardiac Risk Factors include: advanced age (>27men, >82 women);dyslipidemia;hypertension     Objective:    Today's Vitals   04/22/24 1525  Weight: 207 lb (93.9 kg)  Height: 5' 10 (1.778 m)   Body mass index is 29.7 kg/m.     04/22/2024    3:25 PM 04/02/2024    8:03 AM 09/30/2023    8:11 AM 09/04/2023    8:56 PM 04/28/2023    8:23 AM 09/06/2022   10:21 AM 06/22/2022   12:55 PM  Advanced Directives  Does Patient Have a Medical Advance Directive? Yes Yes Yes No Yes No No  Type of Estate agent of  [place];Living will Healthcare Power of [place];Living will Healthcare Power of [employer] of [place];Living will    Does patient want to make changes to medical advance directive?   No - Patient declined      Copy of Healthcare Power of Attorney in Chart? No - copy requested  No - copy requested  No - copy requested    Would patient like information on creating a medical advance directive?       No - Patient declined    Current Medications (verified) Outpatient Encounter Medications as of 04/22/2024  Medication Sig   B Complex Vitamins (B COMPLEX 50) TABS daily   Biotin 1000 MCG tablet    Calcium  Carb-Cholecalciferol (CALCIUM  600 + D PO)    Cholecalciferol (VITAMIN D3) 1000 units CAPS daily   citalopram  (CELEXA ) 20 MG tablet Take 1 tablet (20 mg total) by mouth every morning.   famotidine  (PEPCID ) 40 MG tablet TAKE 1 TABLET TWICE DAILY   hydrochlorothiazide  (HYDRODIURIL ) 12.5 MG tablet TAKE 1 TABLET EVERY DAY   latanoprost (XALATAN) 0.005 % ophthalmic solution Place 1 drop into the left eye at bedtime.   rosuvastatin  (CRESTOR ) 5 MG tablet Take 1 tablet (5 mg total) by mouth daily.   VAGINAL LUBRICANT VA Place vaginally. Patient currently using GYNATROF suppositories   valsartan  (DIOVAN ) 160 MG tablet Take 1 tablet (160 mg total) by mouth daily.   Zinc 50 MG CAPS    baclofen  (LIORESAL ) 10 MG tablet TAKE 1 TABLET 3 TIMES A DAY BY ORAL ROUTE. (Patient not  taking: Reported on 04/22/2024)   No facility-administered encounter medications on file as of 04/22/2024.    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-2014   Hx of transfusion of packed red blood cells    Hyperlipidemia     Hypertension    no meds s/p wt loss   OA (osteoarthritis)    Postop Transfusion 03/21/2013   Postoperative wound infection    Past Surgical History:  Procedure Laterality Date   BREAST REDUCTION SURGERY Bilateral 04-12-2008   CARDIOVASCULAR STRESS TEST  12-26-2009   Normal nuclear study/  no ischemia/  ef 65%   CYSTO/  RIGHT RETROGRADE PYELOGRAM/  BALLOON DILATION RIGHT URETER STRICTURE/  STENT PLACEMENT  05-25-2010//   03-14-2009//   01-28-2009   CYSTOSCOPY WITH STENT PLACEMENT Right 02/18/2014   Procedure: CYSTOSCOPY WITH right STENT PLACEMENT;  Surgeon: Oliva VEAR Oiler, MD;  Location: [facility];  Service: Urology;  Laterality: Right;   EXPLORATORY LAPAROTOMY W/ RIGHT URETER REPAIR  2000   INCISION AND DRAINAGE ABSCESS N/A 11/28/2014   Procedure: INCISION AND DRAINAGE ABSCESS;  Surgeon: Morene LELON Salines, MD;  Location: [facility];  Service: Urology;  Laterality: N/A;   INCISION AND DRAINAGE ABSCESS N/A 02/15/2015   Procedure: INCISION AND DRAINAGE OF WOUND;  Surgeon: Morene LELON Salines, MD;  Location: [facility];  Service: Urology;  Laterality: N/A;   KNEE ARTHROSCOPY  left  2004//   bilateral 1980   KNEE CLOSED REDUCTION Bilateral 06/02/2013   Procedure: CLOSED MANIPULATION BILATERAL KNEES;  Surgeon: Dempsey LULLA Moan, MD;  Location: [facility];  Service: Orthopedics;  Laterality: Bilateral;   REDUCTION MAMMAPLASTY     TONSILLECTOMY AND ADENOIDECTOMY  as child   TOTAL KNEE ARTHROPLASTY Bilateral 03/17/2013   Procedure: TOTAL KNEE BILATERAL;  Surgeon: Dempsey LULLA Moan, MD;  Location: [facility];  Service: Orthopedics;  Laterality: Bilateral;   URETERAL REIMPLANTION Right 09/13/2014   Procedure: OPEN RIGHT URETERAL REIMPLANT;  Surgeon: Morene LELON Salines, MD;  Location: [facility];  Service: Urology;  Laterality: Right;   VAGINAL HYSTERECTOMY  2000   w/ right salpingoophorectomy   Family History  Problem Relation Age of Onset   Diabetes Mother         Deceased   Heart attack Mother    Dementia Mother    Hypertension Father        Deceased   Lung cancer Father    Cancer Other    Heart Problems Brother        CABG   Bipolar disorder Sister    Healthy Daughter    Breast cancer Maternal Grandmother    Social History   Socioeconomic History   Marital status: Divorced    Spouse name: Not on file   Number of children: 3   Years of education: 16   Highest education level: Bachelor's degree (e.g., BA, AB, BS)  Occupational History   Not on file  Tobacco Use   Smoking status: Never   Smokeless tobacco: Never  Vaping Use   Vaping status: Never Used  Substance and Sexual Activity   Alcohol use: No    Alcohol/week: 0.0 standard drinks of alcohol   Drug use: No  Sexual activity: Yes  Other Topics Concern   Not on file  Social History Narrative   [facility]  Lives alone in a 2 story home.  Has 3 daughters.     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     Education: college.   Right handed   Social Drivers of Health   Financial Resource Strain: Low Risk  (04/22/2024)   Overall Financial Resource Strain (CARDIA)    Difficulty of Paying Living Expenses: Not hard at all  Food Insecurity: No Food Insecurity (04/22/2024)   Hunger Vital Sign    Worried About Running Out of Food in the Last Year: Never true    Ran Out of Food in the Last Year: Never true  Transportation Needs: No Transportation Needs (04/22/2024)   PRAPARE - Administrator, Civil Service (Medical): No    Lack of Transportation (Non-Medical): No  Physical Activity: Sufficiently Active (04/22/2024)   Exercise Vital Sign    Days of Exercise per Week: 5 days    Minutes of Exercise per Session: 60 min  Stress: No Stress Concern Present (04/22/2024)   [employer] of Occupational Health - Occupational Stress Questionnaire    Feeling of Stress: Not at all  Social Connections: Moderately Integrated (04/22/2024)   Social Connection and  Isolation Panel    Frequency of Communication with Friends and Family: More than three times a week    Frequency of Social Gatherings with Friends and Family: Once a week    Attends Religious Services: More than 4 times per year    Active Member of [employer] or Organizations: Yes    Attends Engineer, structural: More than 4 times per year    Marital Status: Divorced    Tobacco Counseling Counseling given: Not Answered    Clinical Intake:  Pre-visit preparation completed: Yes  Pain : No/denies pain     BMI - recorded: 29.7 Nutritional Status: BMI > 30  Obese Nutritional Risks: None Diabetes: No  Lab Results  Component Value Date   HGBA1C 5.7 12/12/2023   HGBA1C 6.0 05/27/2023   HGBA1C 6.0 10/28/2022     How often do you need to have someone help you when you read instructions, pamphlets, or other written materials from your doctor or pharmacy?: 1 - Never  Interpreter Needed?: No  Information entered by :: Verdie Saba, CMA   Activities of Daily Living     04/22/2024    3:29 PM 04/28/2023    8:20 AM  In your present state of health, do you have any difficulty performing the following activities:  Hearing? 0 0  Vision? 0 0  Difficulty concentrating or making decisions? 0 0  Walking or climbing stairs? 0 0  Dressing or bathing? 0 0  Doing errands, shopping? 0 0  Preparing Food and eating ? N N  Using the Toilet? N Y  In the past six months, have you accidently leaked urine? N N  Do you have problems with loss of bowel control? N N  Managing your Medications? N N  Managing your Finances? N N  Housekeeping or managing your Housekeeping? N N    Patient Care Team: Geofm Glade PARAS, MD as PCP - General (Internal Medicine) Szabat, Toribio BROCKS, [facility] (Inactive) as Pharmacist (Pharmacist) Joshua Rush, OD (Optometry)  I have updated your Care Teams any recent Medical Services you may have received from other providers in the past year.  Assessment:   This is a  routine wellness examination for Taimi.  Hearing/Vision screen Hearing Screening - Comments:: Denies hearing difficulties   Vision Screening - Comments:: Wears rx glasses - up to date with routine eye exams with [facility]   Goals Addressed               This Visit's Progress     Patient Stated (pt-stated)        Patient stated she has been exercising - walking.  Goal is to walk       Depression Screen     04/22/2024    3:30 PM 02/10/2024    1:19 PM 10/27/2023   10:28 AM 08/13/2023    8:53 AM 04/28/2023    9:54 AM 04/28/2023    8:28 AM 10/28/2022    9:07 AM  PHQ 2/9 Scores  PHQ - 2 Score 0 0 0 0 0 0 0  PHQ- 9 Score 0   0 0 0 0    Fall Risk     04/22/2024    3:29 PM 02/10/2024    1:19 PM 10/27/2023   10:28 AM 09/30/2023    8:10 AM 08/13/2023    8:53 AM  Fall Risk   Falls in the past year? 0 0 0 1 0  Number falls in past yr: 0 0 0 0 0  Injury with Fall? 0 0 0 1 0  Risk for fall due to : No Fall Risks No Fall Risks No Fall Risks  No Fall Risks  Follow up Falls evaluation completed;Falls prevention discussed Falls evaluation completed Falls evaluation completed Falls evaluation completed Falls evaluation completed    MEDICARE RISK AT HOME:  Medicare Risk at Home Any stairs in or around the home?: No If so, are there any without handrails?: No Home free of loose throw rugs in walkways, pet beds, electrical cords, etc?: Yes Adequate lighting in your home to reduce risk of falls?: Yes Life alert?: No Use of a cane, walker or w/c?: No Grab bars in the bathroom?: No Shower chair or bench in shower?: Yes Elevated toilet seat or a handicapped toilet?: No  TIMED UP AND GO:  Was the test performed?  No  Cognitive Function: 6CIT completed      09/30/2023   11:00 AM 04/11/2016    1:43 PM  Montreal Cognitive Assessment   Visuospatial/ Executive (0/5) 4   Naming (0/3) 3   Attention: Read list of digits (0/2) 2   Attention: Read list of letters (0/1) 1   Attention:  Serial 7 subtraction starting at 100 (0/3) 3   Language: Repeat phrase (0/2) 2   Language : Fluency (0/1) 1   Abstraction (0/2) 0   Delayed Recall (0/5) 5   Orientation (0/6) 6   Total 27   Adjusted Score (based on education) 27      Information is confidential and restricted. Go to Review Flowsheets to unlock data.      04/22/2024    3:31 PM 04/28/2023    8:25 AM  6CIT Screen  What Year? 0 points 0 points  What month? 0 points 0 points  What time? 0 points 0 points  Count back from 20 0 points 0 points  Months in reverse 0 points 0 points  Repeat phrase 0 points 0 points  Total Score 0 points 0 points    Immunizations Immunization History  Administered Date(s) Administered   Fluad  Quad(high Dose 65+) 06/27/2021   Fluad  Trivalent(High Dose 65+)  06/24/2023   Influenza, High Dose Seasonal PF 07/09/2022   Influenza,inj,quad, With Preservative 06/23/2020   Influenza-Unspecified 06/12/2017   PFIZER(Purple Top)SARS-COV-2 Vaccination 05/23/2020   Pfizer Covid-19 Vaccine Bivalent Booster 34yrs & up 06/27/2021   Pneumococcal Conjugate-13 08/04/2018   Pneumococcal Polysaccharide-23 06/28/2020   Tdap 02/16/2019, 06/22/2022   Zoster Recombinant(Shingrix ) 08/04/2018, 05/03/2019    Screening Tests Health Maintenance  Topic Date Due   DEXA SCAN  04/20/2024   INFLUENZA VACCINE  04/23/2024   Medicare Annual Wellness (AWV)  04/22/2025   MAMMOGRAM  06/26/2025   DTaP/Tdap/Td (3 - Td or Tdap) 06/22/2032   Colonoscopy  11/01/2032   Pneumococcal Vaccine: 50+ Years  Completed   Hepatitis C Screening  Completed   Zoster Vaccines- Shingrix   Completed   Hepatitis B Vaccines  Aged Out   HPV VACCINES  Aged Out   Meningococcal B Vaccine  Aged Out   COVID-19 Vaccine  Discontinued    Health Maintenance  Health Maintenance Due  Topic Date Due   DEXA SCAN  04/20/2024   Health Maintenance Items Addressed: 7/31//2025  DEXA Scan - Ordered today  Additional Screening:  Vision Screening:  Recommended annual ophthalmology exams for early detection of glaucoma and other disorders of the eye. Would you like a referral to an eye doctor? No  Patient stated she has had an eye exam w/[facility] in 2025.  Dental Screening: Recommended annual dental exams for proper oral hygiene  Community Resource Referral / Chronic Care Management: CRR required this visit?  No   CCM required this visit?  No   Plan:    I have personally reviewed and noted the following in the patient's chart:   Medical and social history Use of alcohol, tobacco or illicit drugs  Current medications and supplements including opioid prescriptions. Patient is not currently taking opioid prescriptions. Functional ability and status Nutritional status Physical activity Advanced directives List of other physicians Hospitalizations, surgeries, and ER visits in previous 12 months Vitals Screenings to include cognitive, depression, and falls Referrals and appointments  In addition, I have reviewed and discussed with patient certain preventive protocols, quality metrics, and best practice recommendations. A written personalized care plan for preventive services as well as general preventive health recommendations were provided to patient.   Verdie CHRISTELLA Saba, CMA   04/22/2024   After Visit Summary: (MyChart) Due to this being a telephonic visit, the after visit summary with patients personalized plan was offered to patient via MyChart   Notes: Nothing significant to report at this time.

## 2024-04-22 NOTE — Patient Instructions (Addendum)
 Jennifer Sanford , Thank you for taking time out of your busy schedule to complete your Annual Wellness Visit with me. I enjoyed our conversation and look forward to speaking with you again next year. I, as well as your care team,  appreciate your ongoing commitment to your health goals. Please review the following plan we discussed and let me know if I can assist you in the future. Your Game plan/ To Do List    Referrals: If you haven't heard from the office you've been referred to, please reach out to them at the phone provided.   Follow up Visits: We will see or speak with you next year for your Next Medicare AWV with our clinical staff Have you seen your provider in the last 6 months (3 months if uncontrolled diabetes)? No  Clinician Recommendations:  Aim for 30 minutes of exercise or brisk walking, 6-8 glasses of water , and 5 servings of fruits and vegetables each day.       This is a list of the screenings recommended for you:  Health Maintenance  Topic Date Due   DEXA scan (bone density measurement)  04/20/2024   Flu Shot  04/23/2024   Medicare Annual Wellness Visit  04/22/2025   Mammogram  06/26/2025   DTaP/Tdap/Td vaccine (3 - Td or Tdap) 06/22/2032   Colon Cancer Screening  11/01/2032   Pneumococcal Vaccine for age over 58  Completed   Hepatitis C Screening  Completed   Zoster (Shingles) Vaccine  Completed   Hepatitis B Vaccine  Aged Out   HPV Vaccine  Aged Out   Meningitis B Vaccine  Aged Out   COVID-19 Vaccine  Discontinued    Advanced directives: (Copy Requested) Please bring a copy of your health care power of attorney and living will to the office to be added to your chart at your convenience. You can mail to [facility] [address] [place], [place] [address] or email to [email or web address] Advance Care Planning is important because it:  [x]  Makes sure you receive the medical care that is consistent with your values, goals, and preferences  [x]   It provides guidance to your family and loved ones and reduces their decisional burden about whether or not they are making the right decisions based on your wishes.  Follow the link provided in your after visit summary or read over the paperwork we have mailed to you to help you started getting your Advance Directives in place. If you need assistance in completing these, please reach out to us  so that we can help you!

## 2024-04-24 ENCOUNTER — Encounter: Payer: Self-pay | Admitting: Internal Medicine

## 2024-04-24 DIAGNOSIS — M542 Cervicalgia: Secondary | ICD-10-CM

## 2024-04-24 DIAGNOSIS — M5412 Radiculopathy, cervical region: Secondary | ICD-10-CM

## 2024-04-26 NOTE — Telephone Encounter (Signed)
 Pt would like MRI for continued neck discomfort, would you like an appt first?

## 2024-04-29 ENCOUNTER — Encounter: Payer: Medicare HMO | Admitting: Internal Medicine

## 2024-04-30 NOTE — Addendum Note (Signed)
 Addended by: GEOFM GLADE PARAS on: 04/30/2024 10:35 AM   Modules accepted: Orders

## 2024-05-11 ENCOUNTER — Ambulatory Visit (INDEPENDENT_AMBULATORY_CARE_PROVIDER_SITE_OTHER)
Admission: RE | Admit: 2024-05-11 | Discharge: 2024-05-11 | Disposition: A | Discharge status: Home / Self Care | Source: Ambulatory Visit | Attending: Internal Medicine

## 2024-05-11 ENCOUNTER — Ambulatory Visit: Payer: Self-pay | Admitting: Internal Medicine

## 2024-05-11 DIAGNOSIS — Z780 Asymptomatic menopausal state: Secondary | ICD-10-CM

## 2024-05-13 NOTE — Progress Notes (Unsigned)
 Subjective:    Patient ID: Jennifer Sanford, female    DOB: 11-Sep-1951, 73 y.o.   MRN: [id number]      HPI Jennifer Sanford is here for a Physical exam and her chronic medical problems.   Having difficulty losing weight.  She knows she needs to give up her sweets in order for that to happen.  She continues to exercise regularly.  Overall feels well and has no concerns.   Medications and allergies reviewed with patient and updated if appropriate.  Current Outpatient Medications on File Prior to Visit  Medication Sig Dispense Refill   B Complex Vitamins (B COMPLEX 50) TABS daily  0   Biotin 1000 MCG tablet      Calcium  Carb-Cholecalciferol (CALCIUM  600 + D PO)      Cholecalciferol (VITAMIN D3) 1000 units CAPS daily 60 capsule    citalopram  (CELEXA ) 20 MG tablet Take 1 tablet (20 mg total) by mouth every morning. 90 tablet 1   famotidine  (PEPCID ) 40 MG tablet TAKE 1 TABLET TWICE DAILY 180 tablet 3   hydrochlorothiazide  (HYDRODIURIL ) 12.5 MG tablet TAKE 1 TABLET EVERY DAY 90 tablet 3   latanoprost (XALATAN) 0.005 % ophthalmic solution Place 1 drop into the left eye at bedtime.     rosuvastatin  (CRESTOR ) 5 MG tablet Take 1 tablet (5 mg total) by mouth daily. 90 tablet 3   VAGINAL LUBRICANT VA Place vaginally. Patient currently using GYNATROF suppositories     valsartan  (DIOVAN ) 160 MG tablet Take 1 tablet (160 mg total) by mouth daily. 90 tablet 3   Zinc 50 MG CAPS      No current facility-administered medications on file prior to visit.    Review of Systems  Constitutional:  Negative for fever.  Eyes:  Negative for visual disturbance.  Respiratory:  Positive for cough (occ). Negative for shortness of breath and wheezing.   Cardiovascular:  Positive for leg swelling (occ LLE swelling > RLE). Negative for chest pain and palpitations.  Gastrointestinal:  Negative for abdominal pain, blood in stool, constipation and diarrhea.       No gerd - controlled  Genitourinary:  Negative for  dysuria.  Musculoskeletal:  Positive for arthralgias (knee with certain exercises) and neck pain (pain varies in intensity). Negative for back pain.  Skin:  Negative for rash.  Neurological:  Negative for weakness, light-headedness and headaches.  Psychiatric/Behavioral:  Negative for dysphoric mood and sleep disturbance. The patient is not nervous/anxious.        Objective:   Vitals:   05/14/24 1408  BP: 120/80  Pulse: 99  Temp: 98.3 F (36.8 C)  SpO2: 99%   Filed Weights   05/14/24 1408  Weight: 210 lb (95.3 kg)   Body mass index is 30.13 kg/m.  BP Readings from Last 3 Encounters:  05/14/24 120/80  02/10/24 132/84  10/27/23 116/78    Wt Readings from Last 3 Encounters:  05/14/24 210 lb (95.3 kg)  04/22/24 207 lb (93.9 kg)  02/10/24 207 lb (93.9 kg)       Physical Exam Constitutional: She appears well-developed and well-nourished. No distress.  HENT:  Head: Normocephalic and atraumatic.  Right Ear: External ear normal. Normal ear canal and TM Left Ear: External ear normal.  Normal ear canal and TM Mouth/Throat: Oropharynx is clear and moist.  Eyes: Conjunctivae normal.  Neck: Neck supple. No tracheal deviation present. No thyromegaly present.  No carotid bruit  Cardiovascular: Normal rate, regular rhythm and normal heart sounds.  No murmur heard.  No edema. Pulmonary/Chest: Effort normal and breath sounds normal. No respiratory distress. She has no wheezes. She has no rales.  Breast: deferred   Abdominal: Soft. She exhibits no distension. There is no tenderness.  Lymphadenopathy: She has no cervical adenopathy.  Skin: Skin is warm and dry. She is not diaphoretic.  Psychiatric: She has a normal mood and affect. Her behavior is normal.     Lab Results  Component Value Date   WBC 8.6 09/04/2023   HGB 12.4 09/04/2023   HCT 37.6 09/04/2023   PLT 278 09/04/2023   GLUCOSE 81 12/12/2023   CHOL 180 12/12/2023   TRIG 92.0 12/12/2023   HDL 63.90 12/12/2023    LDLCALC 97 12/12/2023   ALT 15 12/12/2023   AST 22 12/12/2023   NA 141 12/12/2023   K 4.3 12/12/2023   CL 103 12/12/2023   CREATININE 0.88 12/12/2023   BUN 22 12/12/2023   CO2 30 12/12/2023   TSH 1.24 09/30/2023   INR 2.44 (H) 04/02/2013   HGBA1C 5.7 12/12/2023         Assessment & Plan:   Physical exam: Screening blood work  ordered Exercise  walking 3-4 times a week, [facility] - stretching, weights, bike Weight  obese Substance abuse  none   Reviewed recommended immunizations.   Health Maintenance  Topic Date Due   INFLUENZA VACCINE  04/23/2024   Medicare Annual Wellness (AWV)  04/22/2025   MAMMOGRAM  06/26/2025   DEXA SCAN  05/11/2029   DTaP/Tdap/Td (3 - Td or Tdap) 06/22/2032   Colonoscopy  11/01/2032   Pneumococcal Vaccine: 50+ Years  Completed   Hepatitis C Screening  Completed   Zoster Vaccines- Shingrix   Completed   HPV VACCINES  Aged Out   Meningococcal B Vaccine  Aged Out   COVID-19 Vaccine  Discontinued          See Problem List for Assessment and Plan of chronic medical problems.

## 2024-05-13 NOTE — Patient Instructions (Addendum)

## 2024-05-14 ENCOUNTER — Encounter: Payer: Self-pay | Admitting: Internal Medicine

## 2024-05-14 ENCOUNTER — Ambulatory Visit (INDEPENDENT_AMBULATORY_CARE_PROVIDER_SITE_OTHER): Admitting: Internal Medicine

## 2024-05-14 VITALS — BP 120/80 | HR 99 | Temp 98.3°F | Ht 70.0 in | Wt 210.0 lb

## 2024-05-14 DIAGNOSIS — E782 Mixed hyperlipidemia: Secondary | ICD-10-CM | POA: Diagnosis not present

## 2024-05-14 DIAGNOSIS — I10 Essential (primary) hypertension: Secondary | ICD-10-CM | POA: Diagnosis not present

## 2024-05-14 DIAGNOSIS — F419 Anxiety disorder, unspecified: Secondary | ICD-10-CM | POA: Diagnosis not present

## 2024-05-14 DIAGNOSIS — K219 Gastro-esophageal reflux disease without esophagitis: Secondary | ICD-10-CM | POA: Diagnosis not present

## 2024-05-14 DIAGNOSIS — Z0000 Encounter for general adult medical examination without abnormal findings: Secondary | ICD-10-CM

## 2024-05-14 DIAGNOSIS — N1831 Chronic kidney disease, stage 3a: Secondary | ICD-10-CM | POA: Diagnosis not present

## 2024-05-14 DIAGNOSIS — R7303 Prediabetes: Secondary | ICD-10-CM | POA: Diagnosis not present

## 2024-05-14 DIAGNOSIS — F3289 Other specified depressive episodes: Secondary | ICD-10-CM | POA: Diagnosis not present

## 2024-05-14 LAB — CBC WITH DIFFERENTIAL/PLATELET
Basophils Absolute: 0 K/uL (ref 0.0–0.1)
Basophils Relative: 0.5 % (ref 0.0–3.0)
Eosinophils Absolute: 0.1 K/uL (ref 0.0–0.7)
Eosinophils Relative: 0.8 % (ref 0.0–5.0)
HCT: 39 % (ref 36.0–46.0)
Hemoglobin: 12.9 g/dL (ref 12.0–15.0)
Lymphocytes Relative: 47.7 % — ABNORMAL HIGH (ref 12.0–46.0)
Lymphs Abs: 3.9 K/uL (ref 0.7–4.0)
MCHC: 33 g/dL (ref 30.0–36.0)
MCV: 89.6 fl (ref 78.0–100.0)
Monocytes Absolute: 0.6 K/uL (ref 0.1–1.0)
Monocytes Relative: 8 % (ref 3.0–12.0)
Neutro Abs: 3.5 K/uL (ref 1.4–7.7)
Neutrophils Relative %: 43 % (ref 43.0–77.0)
Platelets: 250 K/uL (ref 150.0–400.0)
RBC: 4.35 Mil/uL (ref 3.87–5.11)
RDW: 14.1 % (ref 11.5–15.5)
WBC: 8.1 K/uL (ref 4.0–10.5)

## 2024-05-14 LAB — LIPID PANEL
Cholesterol: 172 mg/dL (ref 0–200)
HDL: 57.4 mg/dL (ref >39.00)
LDL Cholesterol: 87 mg/dL (ref 0–99)
NonHDL: 114.87
Total CHOL/HDL Ratio: 3
Triglycerides: 137 mg/dL (ref 0.0–149.0)
VLDL: 27.4 mg/dL (ref 0.0–40.0)

## 2024-05-14 LAB — HEMOGLOBIN A1C: Hgb A1c MFr Bld: 5.9 % (ref 4.6–6.5)

## 2024-05-14 LAB — COMPREHENSIVE METABOLIC PANEL WITH GFR
ALT: 12 U/L (ref 0–35)
AST: 18 U/L (ref 0–37)
Albumin: 4.1 g/dL (ref 3.5–5.2)
Alkaline Phosphatase: 70 U/L (ref 39–117)
BUN: 32 mg/dL — ABNORMAL HIGH (ref 6–23)
CO2: 30 meq/L (ref 19–32)
Calcium: 9.5 mg/dL (ref 8.4–10.5)
Chloride: 101 meq/L (ref 96–112)
Creatinine, Ser: 1.13 mg/dL (ref 0.40–1.20)
GFR: 49.17 mL/min — ABNORMAL LOW (ref >60.00)
Glucose, Bld: 85 mg/dL (ref 70–99)
Potassium: 3.5 meq/L (ref 3.5–5.1)
Sodium: 140 meq/L (ref 135–145)
Total Bilirubin: 0.4 mg/dL (ref 0.2–1.2)
Total Protein: 7.4 g/dL (ref 6.0–8.3)

## 2024-05-14 LAB — TSH: TSH: 1 u[IU]/mL (ref 0.35–5.50)

## 2024-05-14 NOTE — Assessment & Plan Note (Signed)
Chronic Controlled, Stable Continue Celexa 20 mg daily

## 2024-05-14 NOTE — Assessment & Plan Note (Signed)
 Chronic GERD controlled Continue Pepcid  40 mg twice daily GERD diet

## 2024-05-14 NOTE — Assessment & Plan Note (Signed)
 Chronic Continue regular exercise and healthy diet-work on decreasing sweets Continue to work on weight loss Check lipid panel, cmp  Continue crestor  5 mg daily

## 2024-05-14 NOTE — Assessment & Plan Note (Signed)
 Chronic Blood pressure adequately controlled CMP, CBC Continue valsartan  160 mg daily, HCTZ 12.5 mg daily

## 2024-05-14 NOTE — Assessment & Plan Note (Signed)
 Chronic Lab Results  Component Value Date   HGBA1C 5.7 12/12/2023   Check a1c Low sugar / carb diet Stressed regular exercise

## 2024-05-14 NOTE — Assessment & Plan Note (Signed)
 Chronic Following with nephrology-Dr. Rayburn Continue increased water  intake, avoiding NSAIDs Blood pressure variable at home but looks very good today Continue valsartan  160 mg daily, HCTZ 12.5 mg daily She will monitor her blood pressure closely at home Cmp, CBC, vitamin d  level

## 2024-05-14 NOTE — Assessment & Plan Note (Signed)
 Chronic Overall controlled Continue citalopram 20 mg daily

## 2024-05-15 ENCOUNTER — Ambulatory Visit: Payer: Self-pay | Admitting: Internal Medicine

## 2024-05-17 ENCOUNTER — Ambulatory Visit
Admission: RE | Admit: 2024-05-17 | Discharge: 2024-05-17 | Disposition: A | Discharge status: Home / Self Care | Source: Ambulatory Visit | Attending: Internal Medicine | Admitting: Internal Medicine

## 2024-05-17 DIAGNOSIS — M5412 Radiculopathy, cervical region: Secondary | ICD-10-CM

## 2024-05-17 DIAGNOSIS — M4802 Spinal stenosis, cervical region: Secondary | ICD-10-CM | POA: Diagnosis not present

## 2024-05-17 DIAGNOSIS — M542 Cervicalgia: Secondary | ICD-10-CM

## 2024-05-18 DIAGNOSIS — N1831 Chronic kidney disease, stage 3a: Secondary | ICD-10-CM | POA: Diagnosis not present

## 2024-05-18 DIAGNOSIS — N390 Urinary tract infection, site not specified: Secondary | ICD-10-CM | POA: Diagnosis not present

## 2024-05-18 DIAGNOSIS — I129 Hypertensive chronic kidney disease with stage 1 through stage 4 chronic kidney disease, or unspecified chronic kidney disease: Secondary | ICD-10-CM | POA: Diagnosis not present

## 2024-05-18 DIAGNOSIS — Z9889 Other specified postprocedural states: Secondary | ICD-10-CM | POA: Diagnosis not present

## 2024-05-18 DIAGNOSIS — K219 Gastro-esophageal reflux disease without esophagitis: Secondary | ICD-10-CM | POA: Diagnosis not present

## 2024-05-18 DIAGNOSIS — R7303 Prediabetes: Secondary | ICD-10-CM | POA: Diagnosis not present

## 2024-05-19 ENCOUNTER — Ambulatory Visit: Payer: Self-pay | Admitting: Internal Medicine

## 2024-05-19 LAB — LAB REPORT - SCANNED
Albumin, Urine POC: 14.5
Albumin/Creatinine Ratio, Urine, POC: 11
Creatinine, POC: 127.5 mg/dL

## 2024-06-03 ENCOUNTER — Other Ambulatory Visit: Payer: Self-pay | Admitting: Internal Medicine

## 2024-06-17 ENCOUNTER — Encounter: Payer: Self-pay | Admitting: Internal Medicine

## 2024-06-17 DIAGNOSIS — H43393 Other vitreous opacities, bilateral: Secondary | ICD-10-CM | POA: Diagnosis not present

## 2024-06-17 DIAGNOSIS — H401121 Primary open-angle glaucoma, left eye, mild stage: Secondary | ICD-10-CM | POA: Diagnosis not present

## 2024-06-17 DIAGNOSIS — H25013 Cortical age-related cataract, bilateral: Secondary | ICD-10-CM | POA: Diagnosis not present

## 2024-06-18 MED ORDER — SCOPOLAMINE 1 MG/3DAYS TD PT72: 1.0000 | MEDICATED_PATCH | TRANSDERMAL | 0 refills | Status: AC

## 2024-06-19 ENCOUNTER — Other Ambulatory Visit (HOSPITAL_BASED_OUTPATIENT_CLINIC_OR_DEPARTMENT_OTHER): Payer: Self-pay

## 2024-06-19 MED ORDER — FLUZONE HIGH-DOSE 0.5 ML IM SUSY
0.5000 mL | PREFILLED_SYRINGE | Freq: Once | INTRAMUSCULAR | 0 refills | Status: AC
  Filled 2024-06-19: qty 0.5, 1d supply, fill #0

## 2024-06-21 ENCOUNTER — Other Ambulatory Visit (HOSPITAL_BASED_OUTPATIENT_CLINIC_OR_DEPARTMENT_OTHER): Payer: Self-pay

## 2024-06-23 ENCOUNTER — Other Ambulatory Visit: Payer: Self-pay | Admitting: Internal Medicine

## 2024-06-30 ENCOUNTER — Telehealth: Payer: Self-pay

## 2024-06-30 ENCOUNTER — Ambulatory Visit (INDEPENDENT_AMBULATORY_CARE_PROVIDER_SITE_OTHER)

## 2024-06-30 ENCOUNTER — Ambulatory Visit: Payer: Self-pay | Admitting: *Deleted

## 2024-06-30 ENCOUNTER — Ambulatory Visit (INDEPENDENT_AMBULATORY_CARE_PROVIDER_SITE_OTHER): Admitting: Family Medicine

## 2024-06-30 ENCOUNTER — Encounter (HOSPITAL_BASED_OUTPATIENT_CLINIC_OR_DEPARTMENT_OTHER): Payer: Self-pay | Admitting: Family Medicine

## 2024-06-30 VITALS — BP 116/75 | HR 90 | Temp 98.4°F | Ht 70.0 in | Wt 212.7 lb

## 2024-06-30 DIAGNOSIS — R051 Acute cough: Secondary | ICD-10-CM | POA: Diagnosis not present

## 2024-06-30 DIAGNOSIS — R0789 Other chest pain: Secondary | ICD-10-CM | POA: Diagnosis not present

## 2024-06-30 DIAGNOSIS — J208 Acute bronchitis due to other specified organisms: Secondary | ICD-10-CM | POA: Diagnosis not present

## 2024-06-30 LAB — POC COVID19 BINAXNOW: SARS Coronavirus 2 Ag: NEGATIVE

## 2024-06-30 LAB — POCT INFLUENZA A/B
Influenza A, POC: NEGATIVE
Influenza B, POC: NEGATIVE

## 2024-06-30 MED ORDER — PREDNISONE 10 MG (21) PO TBPK
ORAL_TABLET | ORAL | 0 refills | Status: AC
Start: 2024-06-30 — End: ?

## 2024-06-30 MED ORDER — AZITHROMYCIN 250 MG PO TABS: ORAL_TABLET | ORAL | 0 refills | Status: AC

## 2024-06-30 MED ORDER — HYDROCODONE BIT-HOMATROP MBR 5-1.5 MG/5ML PO SOLN
5.0000 mL | Freq: Three times a day (TID) | ORAL | 0 refills | Status: DC | PRN
Start: 2024-06-30 — End: 2024-07-26

## 2024-06-30 NOTE — Telephone Encounter (Signed)
 FYI Only or Action Required?: FYI only for provider.  Patient was last seen in primary care on 05/14/2024 by Geofm Glade PARAS, MD.  Called Nurse Triage reporting Nasal Congestion (Sinus pain, sore throat).  Symptoms began several days ago.  Interventions attempted: OTC medications: Mucinex, coricidin .  Symptoms are: gradually worsening.  Triage Disposition: See Physician Within 24 Hours  Patient/caregiver understands and will follow disposition?: yes   Reason for Disposition  Earache  Answer Assessment - Initial Assessment Questions 1. LOCATION: Where does it hurt?      headache 2. ONSET: When did the sinus pain start?  (e.g., hours, days)      No pain 3. SEVERITY: How bad is the pain?   (Scale 0-10; or none, mild, moderate or severe)     na 4. RECURRENT SYMPTOM: Have you ever had sinus problems before? If Yes, ask: When was the last time? and What happened that time?      No- hx bronchitis, pneumonia- negative COVID testing - yesterday 5. NASAL CONGESTION: Is the nose blocked? If Yes, ask: Can you open it or must you breathe through your mouth?     Nasal passage clear- pressure 6. NASAL DISCHARGE: Do you have discharge from your nose? If so ask, What color?     Clear discharge 7. FEVER: Do you have a fever? If Yes, ask: What is it, how was it measured, and when did it start?      no 8. OTHER SYMPTOMS: Do you have any other symptoms? (e.g., sore throat, cough, earache, difficulty breathing)     Cough, sore throat, ear pain  Protocols used: Sinus Pain or Congestion-A-AH   Copied from CRM #[id number]. Topic: Clinical - Red Word Triage >> Jun 30, 2024  8:24 AM Jennifer Sanford wrote: Red Word that prompted transfer to Nurse Triage: Patient has not been feeling well since Monday, has upper congestions, headache, sore throat, body aches, and ear pain.

## 2024-06-30 NOTE — Patient Instructions (Signed)
 Please go to 2nd Floor (Orthopedics) for chest xray. I will call with results.    Rest, drink plenty of fluids.  Tylenol  for fever, body aches.   For cough: Take Mucinex DM or Robitussin-DM OTC.  Follow the instructions in the box.  If you have high Blood pressure , use Coricidin HBP for cough. You can use plain (not DM) Mucinex for congestion.   For nasal congestion: -Use over-the-counter Flonase: 2 nasal sprays on each side of the nose in the morning until you feel better  -Use OTC Astepro 2 nasal sprays on each side of the nose twice daily until better  Avoid decongestants such as  Pseudoephedrine or phenylephrine    Take the antibiotic as prescribed    Call if not gradually better over the next  10 days  Call anytime if the symptoms are severe, you have high fever, short of breath, chest pain

## 2024-06-30 NOTE — Progress Notes (Signed)
 Acute Care Office Visit  Subjective:   Jennifer Sanford April 29, 1951 06/30/2024  Chief Complaint  Patient presents with   Sore Throat    Pt has had complaints of sore throat, sinus pressure, and ear pressure which began 2 days ago. States she just returned from a cruise to the [place] 5 days ago.    HPI: Patient just returned from [place] approx. 5 days ago. She reports symptoms began approx. 2-3 days prior with cough, sinus pressure, and ear pressure. She states she has had some discomfort in chest when she coughs. She denies fever/chills, nausea, vomiting or diarrhea. She did have some mild body aches on Tuesday. She is using Tylenol , Elderberry, black seed oil, sea moss, and Coricidin HBP and OTC allergy, Mucinex. She reports her roommate from the trip has similar symptoms but mild. Reports hx of pneumonia and bronchitis in the past.    The following portions of the patient's history were reviewed and updated as appropriate: past medical history, past surgical history, family history, social history, allergies, medications, and problem list.   Patient Active Problem List   Diagnosis Date Noted   Cervical radiculopathy 02/10/2024   Cerebrovascular small vessel disease 09/28/2023   Concentration deficit 06/26/2022   Concussion with no loss of consciousness 06/26/2022   Shingles 06/01/2022   COVID-19 01/17/2021   Concussion 01/17/2021   CTS (carpal tunnel syndrome) 06/28/2020   Prediabetes 04/29/2018   Hyperlipidemia 03/10/2018   CKD (chronic kidney disease) stage 3, GFR 30-59 ml/min (HCC) 07/22/2017   Essential hypertension, benign 01/29/2016   Memory difficulties 01/29/2016   Anxiety 01/29/2016   Depression 01/29/2016   GERD (gastroesophageal reflux disease) 01/29/2016   Ureteral stricture, right 09/20/2014   S/P ureteral reimplantation 09/13/2014   S/P bilateral unicompartmental knee replacement 03/22/2013   OA (osteoarthritis) of knee 03/17/2013   Pes planus  08/28/2009   Past Medical History:  Diagnosis Date   Anxiety    Complication of anesthesia    VERY CLAUSTROPHIC W/ MASK   Depression    GERD (gastroesophageal reflux disease)    History of ureter repair    right ureteral reimplant for stricture 09-03-2014   Hx of transfusion of packed red blood cells    Hyperlipidemia    Hypertension    no meds s/p wt loss   OA (osteoarthritis)    Postop Transfusion 03/21/2013   Postoperative wound infection    Past Surgical History:  Procedure Laterality Date   BREAST REDUCTION SURGERY Bilateral 04/12/2008   CARDIOVASCULAR STRESS TEST  12/26/2009   Normal nuclear study/  no ischemia/  ef 65%   CYSTO/  RIGHT RETROGRADE PYELOGRAM/  BALLOON DILATION RIGHT URETER STRICTURE/  STENT PLACEMENT  05-25-2010//   03-14-2009//   01-28-2009   CYSTOSCOPY WITH STENT PLACEMENT Right 02/18/2014   Procedure: CYSTOSCOPY WITH right STENT PLACEMENT;  Surgeon: Oliva VEAR Oiler, MD;  [facility];  Service: Urology;  Laterality: Right;   EXPLORATORY LAPAROTOMY W/ RIGHT URETER REPAIR  2000   INCISION AND DRAINAGE ABSCESS N/A 11/28/2014   Procedure: INCISION AND DRAINAGE ABSCESS;  Surgeon: Morene LELON Salines, MD;  Location: [facility];  Service: Urology;  Laterality: N/A;   INCISION AND DRAINAGE ABSCESS N/A 02/15/2015   Procedure: INCISION AND DRAINAGE OF WOUND;  Surgeon: Morene LELON Salines, MD;  Location[facility] ([facility]);  Service: Urology;  Laterality: N/A;   JOINT REPLACEMENT  June 2015   KNEE ARTHROSCOPY  left  2004//  bilateral 1980   KNEE CLOSED REDUCTION Bilateral 06/02/2013   Procedure: CLOSED MANIPULATION BILATERAL KNEES;  Surgeon: Dempsey LULLA Moan, MD;  Location: [facility];  Service: Orthopedics;  Laterality: Bilateral;   REDUCTION MAMMAPLASTY     TONSILLECTOMY AND ADENOIDECTOMY  as child   TOTAL KNEE ARTHROPLASTY Bilateral 03/17/2013   Procedure: TOTAL KNEE BILATERAL;  Surgeon: Dempsey LULLA Moan, MD;  Location: [facility];  Service: Orthopedics;  Laterality: Bilateral;   URETERAL REIMPLANTION Right 09/13/2014   Procedure: OPEN RIGHT URETERAL REIMPLANT;  Surgeon: Morene LELON Salines, MD;  Location: [facility];  Service: Urology;  Laterality: Right;   VAGINAL HYSTERECTOMY  2000   w/ right salpingoophorectomy   Family History  Problem Relation Age of Onset   Diabetes Mother        Deceased   Heart attack Mother    Dementia Mother    Arthritis Mother    Stroke Mother    Hypertension Father        Deceased   Lung cancer Father    Diabetes Father    Cancer Other    Heart Problems Brother        CABG   Bipolar disorder Sister    Healthy Daughter    Breast cancer Maternal Grandmother    Outpatient Medications Prior to Visit  Medication Sig Dispense Refill   B Complex Vitamins (B COMPLEX 50) TABS daily  0   Biotin 1000 MCG tablet      Calcium  Carb-Cholecalciferol (CALCIUM  600 + D PO)      Cholecalciferol (VITAMIN D3) 1000 units CAPS daily 60 capsule    citalopram  (CELEXA ) 20 MG tablet TAKE 1 TABLET BY MOUTH EVERY DAY IN THE MORNING 90 tablet 1   famotidine  (PEPCID ) 40 MG tablet TAKE 1 TABLET TWICE DAILY 180 tablet 3   hydrochlorothiazide  (HYDRODIURIL ) 12.5 MG tablet TAKE 1 TABLET EVERY DAY 90 tablet 3   latanoprost (XALATAN) 0.005 % ophthalmic solution Place 1 drop into the left eye at bedtime.     rosuvastatin  (CRESTOR ) 5 MG tablet Take 1 tablet (5 mg total) by mouth daily. 90 tablet 3   VAGINAL LUBRICANT VA Place vaginally. Patient currently using GYNATROF suppositories     valsartan  (DIOVAN ) 160 MG tablet Take 1 tablet (160 mg total) by mouth daily. 90 tablet 3   Zinc 50 MG CAPS      scopolamine  (TRANSDERM-SCOP) 1 MG/3DAYS Place 1 patch (1 mg total) onto the skin every 3 (three) days. 4 patch 0   No facility-administered medications prior to visit.   Allergies  Allergen Reactions   Advil  [Ibuprofen ] Hives   Mixed Feathers Itching   Naproxen Hives   Shrimp [Shellfish Allergy] Itching      ROS: A complete ROS was performed with pertinent positives/negatives noted in the HPI. The remainder of the ROS are negative.    Objective:   Today's Vitals   06/30/24 0927  BP: 116/75  Pulse: 90  Temp: 98.4 F (36.9 C)  TempSrc: Oral  SpO2: 99%  Weight: 212 lb 11.2 oz (96.5 kg)  Height: 5' 10 (1.778 m)    GENERAL: Ill- appearing, in NAD. Well nourished.  SKIN: Pink, warm and dry. No rash, lesion, ulceration, or ecchymoses.  Head: Normocephalic. NECK: Trachea midline. Full ROM w/o pain or tenderness. Mild anterior cervical lymphadenopathy.  EARS: Tympanic membranes are intact, translucent without bulging and without drainage. Appropriate landmarks visualized.  EYES: Conjunctiva clear without exudates. EOMI, PERRL, no drainage present.  NOSE: Septum midline w/o deformity.  Nares patent, mucosa pink and non-inflamed w/o drainage. No sinus tenderness.  THROAT: Uvula midline. Oropharynx clear. Tonsils surgically absent. Mucous membranes pink and moist.  RESPIRATORY: Chest wall symmetrical. Respirations even and non-labored. Breath sounds slightly diminished to right lower lobe.  Cough is congested and productive.  CARDIAC: S1, S2 present, regular rate and rhythm without murmur or gallops. Peripheral pulses 2+ bilaterally.  MSK: Muscle tone and strength appropriate for age.  NEUROLOGIC: No motor or sensory deficits. Steady, even gait. C2-C12 intact.  PSYCH/MENTAL STATUS: Alert, oriented x 3. Cooperative, appropriate mood and affect.    Results for orders placed or performed in visit on 06/30/24  POC COVID-19 BinaxNow  Result Value Ref Range   SARS Coronavirus 2 Ag Negative Negative  POCT Influenza A/B  Result Value Ref Range   Influenza A, POC Negative Negative   Influenza B, POC Negative Negative      Assessment & Plan:  1. Acute cough (Primary) Chest xray obtained and negative for pneumonia. Likely acute bronchitis. Will send in Zpack, steroid taper and cough syrup.  Hycodan cough syrup sent in to use PRN for congested productive cough. She can continue plain mucinex and Coricidin HBP as needed. Patient declined need for inhaler. Will follow up in 1-2 weeks if no improvement or worsening.  - DG Chest 2 View; Future   Meds ordered this encounter  Medications   HYDROcodone  bit-homatropine (HYCODAN) 5-1.5 MG/5ML syrup    Sig: Take 5 mLs by mouth every 8 (eight) hours as needed for cough.    Dispense:  120 mL    Refill:  0    Supervising Provider:   DE PERU, RAYMOND J [[id number]]   predniSONE  (STERAPRED UNI-PAK 21 TAB) 10 MG (21) TBPK tablet    Sig: Use as directed.    Dispense:  21 each    Refill:  0    Supervising Provider:   DE PERU, RAYMOND J [[id number]]   azithromycin  (ZITHROMAX ) 250 MG tablet    Sig: Take 2 tablets on day 1, then 1 tablet daily on days 2 through 5    Dispense:  6 tablet    Refill:  0    Supervising Provider:   DE PERU, RAYMOND J [[id number]]    Return if symptoms worsen or fail to improve.    Patient to reach out to office if new, worrisome, or unresolved symptoms arise or if no improvement in patient's condition. Patient verbalized understanding and is agreeable to treatment plan. All questions answered to patient's satisfaction.    Jennifer Sanford, [place]

## 2024-06-30 NOTE — Telephone Encounter (Signed)
 Copied from CRM [id number]. Topic: Clinical - Medication Question >> Jun 30, 2024 11:22 AM Shanda MATSU wrote: Reason for CRM: Patient called in regards to provider advising her that a steroid pack was going to be sent to the pharmacy as well to help with symptoms she is having, patient stated that pharmacy has not recvd prescription.

## 2024-07-02 ENCOUNTER — Other Ambulatory Visit: Payer: Self-pay | Admitting: Internal Medicine

## 2024-07-02 DIAGNOSIS — Z1231 Encounter for screening mammogram for malignant neoplasm of breast: Secondary | ICD-10-CM

## 2024-07-14 ENCOUNTER — Ambulatory Visit

## 2024-07-16 DIAGNOSIS — I129 Hypertensive chronic kidney disease with stage 1 through stage 4 chronic kidney disease, or unspecified chronic kidney disease: Secondary | ICD-10-CM | POA: Diagnosis not present

## 2024-07-16 DIAGNOSIS — N1831 Chronic kidney disease, stage 3a: Secondary | ICD-10-CM | POA: Diagnosis not present

## 2024-07-21 ENCOUNTER — Ambulatory Visit

## 2024-07-22 ENCOUNTER — Encounter: Payer: Self-pay | Admitting: Internal Medicine

## 2024-07-26 ENCOUNTER — Encounter: Payer: Self-pay | Admitting: Internal Medicine

## 2024-07-26 ENCOUNTER — Ambulatory Visit: Payer: Self-pay | Admitting: Internal Medicine

## 2024-07-26 ENCOUNTER — Ambulatory Visit (INDEPENDENT_AMBULATORY_CARE_PROVIDER_SITE_OTHER): Admitting: Internal Medicine

## 2024-07-26 ENCOUNTER — Ambulatory Visit (INDEPENDENT_AMBULATORY_CARE_PROVIDER_SITE_OTHER)

## 2024-07-26 VITALS — BP 118/70 | HR 66 | Temp 98.2°F | Ht 70.0 in | Wt 212.0 lb

## 2024-07-26 DIAGNOSIS — R0602 Shortness of breath: Secondary | ICD-10-CM | POA: Diagnosis not present

## 2024-07-26 DIAGNOSIS — R053 Chronic cough: Secondary | ICD-10-CM | POA: Diagnosis not present

## 2024-07-26 DIAGNOSIS — J22 Unspecified acute lower respiratory infection: Secondary | ICD-10-CM | POA: Insufficient documentation

## 2024-07-26 DIAGNOSIS — R051 Acute cough: Secondary | ICD-10-CM | POA: Diagnosis not present

## 2024-07-26 MED ORDER — HYDROCOD POLI-CHLORPHE POLI ER 10-8 MG/5ML PO SUER: 5.0000 mL | Freq: Two times a day (BID) | ORAL | 0 refills | Status: AC | PRN

## 2024-07-26 MED ORDER — CEFDINIR 300 MG PO CAPS: 300.0000 mg | ORAL_CAPSULE | Freq: Two times a day (BID) | ORAL | 0 refills | Status: AC

## 2024-07-26 NOTE — Progress Notes (Signed)
 Subjective:    Patient ID: Jennifer Sanford, female    DOB: June 27, 1951, 73 y.o.   MRN: [id number]      HPI Jennifer Sanford is here for  Chief Complaint  Patient presents with   Cough    Cough that started around 1st of October and has been losing her voice off and on.   Discussed the use of AI scribe software for clinical note transcription with the patient, who gave verbal consent to proceed.  History of Present Illness Jennifer Sanford is a 73 year old female who presents with persistent cough and chest congestion.  She has been experiencing a persistent cough and chest congestion since October 8th. The cough is non-stop, particularly when lying down, forcing her to sleep in a chair for two to three weeks. Although the cough is less productive now, she still feels as though something is 'sitting' in her chest. She has a history of bronchitis, but this episode is lasting longer than usual.  She was previously treated with a Z-Pak, steroids, and cough syrup, which provided minimal relief. She also tried using a spirometer and various over-the-counter remedies without significant improvement. She feels winded with minimal exertion, such as climbing stairs, although her oxygen saturation remains in the 90s. No fever, chills, sore throat, ear pain, or wheezing. Nasal congestion is primarily in the mornings, with some post-nasal drainage.  She has not experienced any significant relief from the prednisone  and has stopped using the spirometer recently. She avoids cold water , opting for warm drinks instead, which she finds more soothing. She has not been able to return to her usual activities, such as going to the [facility], in recent weeks.  She has not had any significant gastrointestinal symptoms, aside from a single episode of vomiting and sweating, which she attributes to possible food poisoning. She takes vitamins regularly and is concerned about the prolonged nature of her symptoms.         Medications and allergies reviewed with patient and updated if appropriate.  Current Outpatient Medications on File Prior to Visit  Medication Sig Dispense Refill   B Complex Vitamins (B COMPLEX 50) TABS daily  0   Biotin 1000 MCG tablet      Calcium  Carb-Cholecalciferol (CALCIUM  600 + D PO)      Cholecalciferol (VITAMIN D3) 1000 units CAPS daily 60 capsule    citalopram  (CELEXA ) 20 MG tablet TAKE 1 TABLET BY MOUTH EVERY DAY IN THE MORNING 90 tablet 1   famotidine  (PEPCID ) 40 MG tablet TAKE 1 TABLET TWICE DAILY 180 tablet 3   hydrochlorothiazide  (HYDRODIURIL ) 12.5 MG tablet TAKE 1 TABLET EVERY DAY 90 tablet 3   HYDROcodone  bit-homatropine (HYCODAN) 5-1.5 MG/5ML syrup Take 5 mLs by mouth every 8 (eight) hours as needed for cough. 120 mL 0   latanoprost (XALATAN) 0.005 % ophthalmic solution Place 1 drop into the left eye at bedtime.     predniSONE  (STERAPRED UNI-PAK 21 TAB) 10 MG (21) TBPK tablet Use as directed. 21 each 0   rosuvastatin  (CRESTOR ) 5 MG tablet Take 1 tablet (5 mg total) by mouth daily. 90 tablet 3   scopolamine  (TRANSDERM-SCOP) 1 MG/3DAYS Place 1 patch (1 mg total) onto the skin every 3 (three) days. 4 patch 0   VAGINAL LUBRICANT VA Place vaginally. Patient currently using GYNATROF suppositories     valsartan  (DIOVAN ) 160 MG tablet Take 1 tablet (160 mg total) by mouth daily. 90 tablet 3   Zinc 50 MG CAPS  valsartan  (DIOVAN ) 320 MG tablet Take 320 mg by mouth daily. (Patient not taking: Reported on 07/26/2024)     No current facility-administered medications on file prior to visit.    Review of Systems  Constitutional:  Negative for chills and fever.  HENT:  Negative for congestion, ear pain, sinus pressure and sore throat.   Respiratory:  Positive for cough (productive first thing in the morning -- feels mucus in chest) and shortness of breath. Negative for chest tightness and wheezing.   Neurological:  Negative for light-headedness and headaches.        Objective:   Vitals:   07/26/24 1538  BP: 118/70  Pulse: 66  Temp: 98.2 F (36.8 C)  SpO2: 99%   BP Readings from Last 3 Encounters:  07/26/24 118/70  06/30/24 116/75  05/14/24 120/80   Wt Readings from Last 3 Encounters:  07/26/24 217 lb (98.4 kg)  06/30/24 212 lb 11.2 oz (96.5 kg)  05/14/24 210 lb (95.3 kg)   Body mass index is 31.14 kg/m.    Physical Exam Constitutional:      General: She is not in acute distress.    Appearance: Normal appearance. She is not ill-appearing.  HENT:     Head: Normocephalic and atraumatic.     Right Ear: Tympanic membrane, ear canal and external ear normal.     Left Ear: Tympanic membrane, ear canal and external ear normal.     Mouth/Throat:     Mouth: Mucous membranes are moist.     Pharynx: No oropharyngeal exudate or posterior oropharyngeal erythema.  Eyes:     Conjunctiva/sclera: Conjunctivae normal.  Cardiovascular:     Rate and Rhythm: Normal rate and regular rhythm.  Pulmonary:     Effort: Pulmonary effort is normal. No respiratory distress.     Breath sounds: Normal breath sounds. No wheezing or rales.  Musculoskeletal:     Cervical back: Neck supple. No tenderness.  Lymphadenopathy:     Cervical: No cervical adenopathy.  Skin:    General: Skin is warm and dry.  Neurological:     Mental Status: She is alert.            Assessment & Plan:    See Problem List for Assessment and Plan of chronic medical problems.

## 2024-07-26 NOTE — Assessment & Plan Note (Signed)
 Subacute S/p zpak, prednisone , cough syrup Persistent symptoms - in particular chest congestion, productive cough, hoarseness, fatigue Cxr today Start omnicef 300 mg bid x 7 days Tussionex cough syrup Increased fluids, rest Call if no improvement

## 2024-07-26 NOTE — Patient Instructions (Addendum)
      Have a chest xray today       Medications changes include :   omnicef twice daily x 7 days, tussionex cough syrup     Return if symptoms worsen or fail to improve.

## 2024-08-01 ENCOUNTER — Other Ambulatory Visit: Payer: Self-pay | Admitting: Internal Medicine

## 2024-08-10 ENCOUNTER — Ambulatory Visit
Admission: RE | Admit: 2024-08-10 | Discharge: 2024-08-10 | Disposition: A | Discharge status: Home / Self Care | Source: Ambulatory Visit | Attending: Internal Medicine | Admitting: Internal Medicine

## 2024-08-10 DIAGNOSIS — Z1231 Encounter for screening mammogram for malignant neoplasm of breast: Secondary | ICD-10-CM

## 2024-08-23 ENCOUNTER — Institutional Professional Consult (permissible substitution): Payer: Medicare HMO | Admitting: Psychology

## 2024-08-23 ENCOUNTER — Ambulatory Visit: Payer: Self-pay

## 2024-08-31 ENCOUNTER — Encounter: Payer: Medicare HMO | Admitting: Psychology

## 2024-09-01 ENCOUNTER — Encounter: Payer: Medicare HMO | Admitting: Psychology

## 2025-04-27 ENCOUNTER — Ambulatory Visit
